# Patient Record
Sex: Male | Born: 1940 | Race: White | Hispanic: No | Marital: Married | Smoking: Never smoker
Health system: Southern US, Community
[De-identification: ages and names within clinical notes are randomized; demographics above are authoritative.]

## PROBLEM LIST (undated history)

## (undated) DIAGNOSIS — Z8719 Personal history of other diseases of the digestive system: Secondary | ICD-10-CM

## (undated) DIAGNOSIS — IMO0001 Reserved for inherently not codable concepts without codable children: Secondary | ICD-10-CM

## (undated) DIAGNOSIS — I2119 ST elevation (STEMI) myocardial infarction involving other coronary artery of inferior wall: Secondary | ICD-10-CM

## (undated) DIAGNOSIS — I2510 Atherosclerotic heart disease of native coronary artery without angina pectoris: Secondary | ICD-10-CM

## (undated) DIAGNOSIS — F329 Major depressive disorder, single episode, unspecified: Secondary | ICD-10-CM

## (undated) DIAGNOSIS — L130 Dermatitis herpetiformis: Secondary | ICD-10-CM

## (undated) DIAGNOSIS — B002 Herpesviral gingivostomatitis and pharyngotonsillitis: Secondary | ICD-10-CM

## (undated) DIAGNOSIS — G20 Parkinson's disease: Secondary | ICD-10-CM

## (undated) DIAGNOSIS — G4730 Sleep apnea, unspecified: Secondary | ICD-10-CM

## (undated) DIAGNOSIS — Z8619 Personal history of other infectious and parasitic diseases: Secondary | ICD-10-CM

## (undated) DIAGNOSIS — R001 Bradycardia, unspecified: Secondary | ICD-10-CM

## (undated) DIAGNOSIS — M7022 Olecranon bursitis, left elbow: Secondary | ICD-10-CM

## (undated) DIAGNOSIS — S2239XA Fracture of one rib, unspecified side, initial encounter for closed fracture: Secondary | ICD-10-CM

## (undated) DIAGNOSIS — Z0000 Encounter for general adult medical examination without abnormal findings: Principal | ICD-10-CM

## (undated) DIAGNOSIS — H43819 Vitreous degeneration, unspecified eye: Secondary | ICD-10-CM

## (undated) DIAGNOSIS — Z950 Presence of cardiac pacemaker: Secondary | ICD-10-CM

## (undated) DIAGNOSIS — I442 Atrioventricular block, complete: Secondary | ICD-10-CM

## (undated) DIAGNOSIS — I10 Essential (primary) hypertension: Secondary | ICD-10-CM

## (undated) DIAGNOSIS — N400 Enlarged prostate without lower urinary tract symptoms: Secondary | ICD-10-CM

## (undated) DIAGNOSIS — S02401A Maxillary fracture, unspecified, initial encounter for closed fracture: Secondary | ICD-10-CM

## (undated) DIAGNOSIS — F32A Depression, unspecified: Secondary | ICD-10-CM

## (undated) DIAGNOSIS — T85738A Infection and inflammatory reaction due to other nervous system device, implant or graft, initial encounter: Secondary | ICD-10-CM

## (undated) DIAGNOSIS — R1310 Dysphagia, unspecified: Secondary | ICD-10-CM

## (undated) DIAGNOSIS — L309 Dermatitis, unspecified: Secondary | ICD-10-CM

## (undated) DIAGNOSIS — G20A1 Parkinson's disease without dyskinesia, without mention of fluctuations: Secondary | ICD-10-CM

## (undated) HISTORY — DX: Personal history of other infectious and parasitic diseases: Z86.19

## (undated) HISTORY — DX: Bradycardia, unspecified: R00.1

## (undated) HISTORY — DX: Maxillary fracture, unspecified side, initial encounter for closed fracture: S02.401A

## (undated) HISTORY — DX: Sleep apnea, unspecified: G47.30

## (undated) HISTORY — PX: INSERT / REPLACE / REMOVE PACEMAKER: SUR710

## (undated) HISTORY — DX: Atherosclerotic heart disease of native coronary artery without angina pectoris: I25.10

## (undated) HISTORY — PX: INSERTION / PLACEMENT / REVISION NEUROSTIMULATOR: SUR720

## (undated) HISTORY — DX: Dermatitis, unspecified: L30.9

## (undated) HISTORY — DX: Benign prostatic hyperplasia without lower urinary tract symptoms: N40.0

## (undated) HISTORY — PX: INGUINAL HERNIA REPAIR: SUR1180

## (undated) HISTORY — DX: Major depressive disorder, single episode, unspecified: F32.9

## (undated) HISTORY — DX: Parkinson's disease: G20

## (undated) HISTORY — DX: ST elevation (STEMI) myocardial infarction involving other coronary artery of inferior wall: I21.19

## (undated) HISTORY — DX: Encounter for general adult medical examination without abnormal findings: Z00.00

## (undated) HISTORY — DX: Fracture of one rib, unspecified side, initial encounter for closed fracture: S22.39XA

## (undated) HISTORY — DX: Depression, unspecified: F32.A

## (undated) HISTORY — PX: EYE SURGERY: SHX253

## (undated) HISTORY — DX: Essential (primary) hypertension: I10

## (undated) HISTORY — DX: Parkinson's disease without dyskinesia, without mention of fluctuations: G20.A1

## (undated) HISTORY — PX: CARDIAC CATHETERIZATION: SHX172

## (undated) HISTORY — PX: OTHER SURGICAL HISTORY: SHX169

## (undated) HISTORY — DX: Dermatitis herpetiformis: L13.0

## (undated) HISTORY — PX: DEEP BRAIN STIMULATOR PLACEMENT: SHX608

## (undated) HISTORY — DX: Atrioventricular block, complete: I44.2

## (undated) HISTORY — DX: Reserved for inherently not codable concepts without codable children: IMO0001

---

## 1984-11-10 DIAGNOSIS — I2510 Atherosclerotic heart disease of native coronary artery without angina pectoris: Secondary | ICD-10-CM

## 1984-11-10 HISTORY — DX: Atherosclerotic heart disease of native coronary artery without angina pectoris: I25.10

## 1998-05-23 ENCOUNTER — Other Ambulatory Visit: Admission: RE | Admit: 1998-05-23 | Discharge: 1998-05-23 | Payer: Self-pay | Admitting: Cardiology

## 2000-05-06 ENCOUNTER — Ambulatory Visit (HOSPITAL_BASED_OUTPATIENT_CLINIC_OR_DEPARTMENT_OTHER): Admission: RE | Admit: 2000-05-06 | Discharge: 2000-05-06 | Payer: Self-pay | Admitting: Otolaryngology

## 2001-04-05 ENCOUNTER — Encounter (INDEPENDENT_AMBULATORY_CARE_PROVIDER_SITE_OTHER): Payer: Self-pay

## 2001-04-05 ENCOUNTER — Other Ambulatory Visit: Admission: RE | Admit: 2001-04-05 | Discharge: 2001-04-05 | Payer: Self-pay | Admitting: Surgery

## 2004-11-10 DIAGNOSIS — IMO0001 Reserved for inherently not codable concepts without codable children: Secondary | ICD-10-CM

## 2004-11-10 HISTORY — DX: Reserved for inherently not codable concepts without codable children: IMO0001

## 2005-03-06 LAB — HM COLONOSCOPY: HM Colonoscopy: NORMAL

## 2005-11-10 DIAGNOSIS — IMO0001 Reserved for inherently not codable concepts without codable children: Secondary | ICD-10-CM

## 2005-11-10 HISTORY — DX: Reserved for inherently not codable concepts without codable children: IMO0001

## 2007-04-26 ENCOUNTER — Ambulatory Visit: Payer: Self-pay | Admitting: Internal Medicine

## 2007-11-10 ENCOUNTER — Telehealth (INDEPENDENT_AMBULATORY_CARE_PROVIDER_SITE_OTHER): Payer: Self-pay | Admitting: *Deleted

## 2007-11-11 DIAGNOSIS — S02401A Maxillary fracture, unspecified, initial encounter for closed fracture: Secondary | ICD-10-CM

## 2007-11-11 HISTORY — DX: Maxillary fracture, unspecified side, initial encounter for closed fracture: S02.401A

## 2007-11-18 DIAGNOSIS — L130 Dermatitis herpetiformis: Secondary | ICD-10-CM

## 2007-11-18 DIAGNOSIS — I219 Acute myocardial infarction, unspecified: Secondary | ICD-10-CM | POA: Insufficient documentation

## 2007-11-18 DIAGNOSIS — G4730 Sleep apnea, unspecified: Secondary | ICD-10-CM

## 2007-11-18 DIAGNOSIS — G20 Parkinson's disease: Secondary | ICD-10-CM | POA: Insufficient documentation

## 2008-04-04 ENCOUNTER — Emergency Department (HOSPITAL_COMMUNITY): Admission: EM | Admit: 2008-04-04 | Discharge: 2008-04-04 | Payer: Self-pay | Admitting: Emergency Medicine

## 2008-04-24 ENCOUNTER — Ambulatory Visit: Payer: Self-pay | Admitting: Internal Medicine

## 2008-07-04 ENCOUNTER — Telehealth (INDEPENDENT_AMBULATORY_CARE_PROVIDER_SITE_OTHER): Payer: Self-pay | Admitting: *Deleted

## 2008-10-24 ENCOUNTER — Ambulatory Visit: Payer: Self-pay | Admitting: Internal Medicine

## 2009-09-11 ENCOUNTER — Telehealth: Payer: Self-pay | Admitting: Internal Medicine

## 2009-11-10 DIAGNOSIS — S2249XA Multiple fractures of ribs, unspecified side, initial encounter for closed fracture: Secondary | ICD-10-CM

## 2009-11-10 HISTORY — DX: Multiple fractures of ribs, unspecified side, initial encounter for closed fracture: S22.49XA

## 2010-05-09 ENCOUNTER — Encounter: Payer: Self-pay | Admitting: Internal Medicine

## 2010-06-21 ENCOUNTER — Ambulatory Visit: Payer: Self-pay | Admitting: Cardiology

## 2010-07-11 DIAGNOSIS — S2239XA Fracture of one rib, unspecified side, initial encounter for closed fracture: Secondary | ICD-10-CM

## 2010-07-11 HISTORY — DX: Fracture of one rib, unspecified side, initial encounter for closed fracture: S22.39XA

## 2010-07-19 ENCOUNTER — Ambulatory Visit: Payer: Self-pay | Admitting: Cardiology

## 2010-07-22 ENCOUNTER — Encounter: Admission: RE | Admit: 2010-07-22 | Discharge: 2010-10-01 | Payer: Self-pay | Admitting: Neurology

## 2010-07-29 ENCOUNTER — Ambulatory Visit: Payer: Self-pay | Admitting: Cardiovascular Disease

## 2010-08-07 ENCOUNTER — Encounter: Admission: RE | Admit: 2010-08-07 | Discharge: 2010-08-07 | Payer: Self-pay | Admitting: Cardiology

## 2010-08-07 ENCOUNTER — Ambulatory Visit: Payer: Self-pay | Admitting: Cardiology

## 2010-10-27 ENCOUNTER — Inpatient Hospital Stay (HOSPITAL_COMMUNITY)
Admission: EM | Admit: 2010-10-27 | Discharge: 2010-10-29 | Payer: Self-pay | Source: Home / Self Care | Attending: Cardiology | Admitting: Cardiology

## 2010-10-28 HISTORY — PX: PACEMAKER INSERTION: SHX728

## 2010-11-06 ENCOUNTER — Encounter: Payer: Self-pay | Admitting: Internal Medicine

## 2010-11-06 ENCOUNTER — Ambulatory Visit: Payer: Self-pay

## 2010-11-11 ENCOUNTER — Observation Stay (HOSPITAL_COMMUNITY)
Admission: EM | Admit: 2010-11-11 | Discharge: 2010-11-12 | Payer: Self-pay | Source: Home / Self Care | Attending: Internal Medicine | Admitting: Internal Medicine

## 2010-11-18 ENCOUNTER — Ambulatory Visit: Payer: Self-pay | Admitting: Cardiology

## 2010-11-27 ENCOUNTER — Encounter
Admission: RE | Admit: 2010-11-27 | Discharge: 2010-12-10 | Payer: Self-pay | Source: Home / Self Care | Attending: Cardiology | Admitting: Cardiology

## 2010-12-10 NOTE — Letter (Signed)
Summary: CMN for CPAP Supplies/[facility]  CMN for CPAP Supplies/[facility]   Imported By: Sherian Rein 05/20/2010 09:22:34  _____________________________________________________________________  External Attachment:    Type:   Image     Comment:   External Document

## 2010-12-12 NOTE — Procedures (Signed)
Summary: [facility]. pt x .gd   Current Medications (verified): 1)  Mirapex 0.25 Mg  Tabs (Pramipexole Dihydrochloride) .... Take 1 Tablet By Mouth Four Times A Day 2)  Carbidopa-Levodopa 25-100 Mg  Tabs (Carbidopa-Levodopa) .... Take 2 Tablets At 7am, 1.5 At 10am, 1pm, and 4pm Daily 3)  Lexapro 10 Mg  Tabs (Escitalopram Oxalate) 4)  Norvasc 5 Mg  Tabs (Amlodipine Besylate) .... Take 1/2 Tab By Mouth Once Daily 5)  Zetia 10 Mg  Tabs (Ezetimibe) .... Take 5 Tabs (Total of 50mg ) By Mouth Once Daily 6)  Warfarin 7)  Flomax 0.4 Mg  Cp24 (Tamsulosin Hcl) 8)  Dapsone 25 Mg  Tabs (Dapsone) .... Take 1 Tablet By Mouth Two Times A Day 9)  Bipap 10)  Nitro 11)  Carbidopa-Levodopa Cr 25-100 Mg  Tbcr (Carbidopa-Levodopa) .... Take 1 Tablet At 7-9pm Daily 12)  Aricept 10 Mg  Tabs (Donepezil Hcl) .Marland Kitchen.. 1 By Mouth Once Daily 13)  Amantadine Hcl 100 Mg  Tabs (Amantadine Hcl) .... Take 2 Tablets At 7 Am and 4 Pm Daily 14)  Comtan 200 Mg Tabs (Entacapone) .... One By Mouth Daily 15)  Wellbutrin 75 Mg Tabs (Bupropion Hcl) .... One By Mouth Daily 16)  Aspir-Low 81 Mg Tbec (Aspirin) .... One By Mouth Daily 17)  Sinemet 25-100 Mg Tabs (Carbidopa-Levodopa) .... One By Mouth At Bedtime 18)  Ciprofloxacin Hcl 500 Mg Tabs (Ciprofloxacin Hcl) .... One By Mouth Two Times A Day  Allergies (verified): 1)  ! * Gluten 2)  ! Bactrim  PPM Specifications Following MD:  Lewayne Bunting, MD     PPM Vendor:  Medtronic     PPM Model Number:  [id number]     PPM Serial Number:  [id number] PPM DOI:  10/28/2010     PPM Implanting MD:  Lewayne Bunting, MD  Lead 1    Location: RA     DOI: 10/28/2010     Model #: [id number]     Serial #: [id number]     Status: active Lead 2    Location: RV     DOI: 10/28/2010     Model #: [id number]     Serial #: [id number]     Status: active  Magnet Response Rate:  BOL 85 ERI 65  Indications:  CHB   PPM Follow Up Battery Voltage:  2.76 V     Battery Est. Longevity:  4 yrs       PPM Device Measurements Atrium   Amplitude: 2.00 mV, Impedance: 453 ohms, Threshold: 1.750 V at 1.00 msec Right Ventricle  Amplitude: 15.68 mV, Impedance: 635 ohms, Threshold: 0.50 V at 0.40 msec  Episodes MS Episodes:  4     Percent Mode Switch:  <0.1%     Ventricular High Rate:  0     Atrial Pacing:  94.6%     Ventricular Pacing:  99.9%  Parameters Mode:  DDDR     Lower Rate Limit:  60     Upper Rate Limit:  130 Paced AV Delay:  180     Sensed AV Delay:  150 Next Cardiology Appt Due:  01/21/2011 Tech Comments:  WOUND CHECK---STERI STRIPS REMOVED.  NO REDNESS OR SWELLING AT SITE.  NORMAL DEVICE FUNCTION.  CHANGED RA OUTPUT FROM 5.00 TO 3.50 DUE TO RA THRESHOLD BEING 1.750 @ 1.00.  ROV 01-21-11 W/GT. Vella Kohler  November 06, 2010 2:55 PM

## 2010-12-12 NOTE — Cardiovascular Report (Signed)
Summary: Office Visit   Office Visit   Imported By: Roderic Ovens 11/13/2010 12:23:21  _____________________________________________________________________  External Attachment:    Type:   Image     Comment:   External Document

## 2010-12-16 ENCOUNTER — Ambulatory Visit: Payer: Medicare Other | Attending: Cardiology | Admitting: Physical Therapy

## 2010-12-16 DIAGNOSIS — G20 Parkinson's disease: Secondary | ICD-10-CM | POA: Insufficient documentation

## 2010-12-16 DIAGNOSIS — Z5189 Encounter for other specified aftercare: Secondary | ICD-10-CM | POA: Insufficient documentation

## 2010-12-16 DIAGNOSIS — R293 Abnormal posture: Secondary | ICD-10-CM | POA: Insufficient documentation

## 2010-12-16 DIAGNOSIS — R279 Unspecified lack of coordination: Secondary | ICD-10-CM | POA: Insufficient documentation

## 2010-12-16 DIAGNOSIS — G20A1 Parkinson's disease without dyskinesia, without mention of fluctuations: Secondary | ICD-10-CM | POA: Insufficient documentation

## 2010-12-16 DIAGNOSIS — R269 Unspecified abnormalities of gait and mobility: Secondary | ICD-10-CM | POA: Insufficient documentation

## 2010-12-16 DIAGNOSIS — R471 Dysarthria and anarthria: Secondary | ICD-10-CM | POA: Insufficient documentation

## 2010-12-18 ENCOUNTER — Ambulatory Visit: Payer: Medicare Other | Admitting: Physical Therapy

## 2010-12-19 NOTE — Discharge Summary (Signed)
[name], [name] NO.:  [id number]  MEDICAL RECORD NO.:  [id number]          PATIENT TYPE:  INP  LOCATION:  3730                         FACILITY:  [name]  PHYSICIAN:  Doylene Canning. Ladona Ridgel, MD    DATE OF BIRTH:  [birth year]  DATE OF ADMISSION:  10/27/2010 DATE OF DISCHARGE:  10/29/2010                              DISCHARGE SUMMARY   PRIMARY CARDIOLOGIST:  Doylene Canning. Ladona Ridgel, MD  PRIMARY CARE [name]:  [name]  DISCHARGE DIAGNOSIS:  Syncope.  SECONDARY DIAGNOSES: 1. Symptomatic bradycardia with complete heart block, requiring     pacemaker placement. 2. Coronary artery disease, status post prior inferior myocardial     infarction with multiple percutaneous coronary interventions. 3. Severe Parkinson disease. 4. Obstructive sleep apnea. 5. Hypertension. 6. Dermatitis herpetiformis. 7. History of right maxillary fracture in 2009, status post rib     fractures in September 2007, following a fall. 8. Status post brain stimulator placement for Parkinson. 9. Status post arthroscopic surgery of left knee. 10.Status post bilateral cataract surgeries. 11.Status post bilateral inguinal hernia repairs. 12.Status post removal of ganglion cyst from his right wrist.  ALLERGIES:  No known drug allergies.  PROCEDURES:  Placement of temporary transvenous pacemaker, October 27, 2010.  PROCEDURE:  Successful placement of a Medtronic Sensia dual-chamber permanent pacemaker, serial number [id number], October 28, 2010.  HISTORY OF PRESENT ILLNESS:  A 70 year old male with the above problem list.  The patient presented to [name] on October 27, 2010 following two episodes of syncope during which, the patient was briefly unresponsive.  The second episode was witnessed and after regaining consciousness after just a few seconds, the patient was taken to the [name].  While in the [name], he had an another episode of syncope associated with  complete heart block and 12 seconds of asystole.  Heart rate was subsequently in the 40s.  The patient was admitted for further evaluation.  HOSPITAL COURSE:  Given symptomatic bradycardia and syncope, a temporary transvenous permanent pacemaker was placed on October 27, 2010.  The patient tolerated this well.  He had no further syncope and lab work was relatively unrevealing.  Electrophysiology was consulted and the patient underwent successful permanent pacemaker placement on October 28, 2010 with placement of Medtronic Clayton dual-chamber permanent pacemaker. The patient tolerated this procedure well.  This morning, the patient's chest x-ray showed a tiny right apical pneumothorax (pacer was placed on the right side secondary to prior placement of brain stimulator on the left).  With knowledge of a tiny apical pneumothorax, the patient was placed on oxygen therapy.  We obtained an another chest x-ray this afternoon, which has shown stable tiny right apical pneumothorax.  This being the case, we plan to discharge the patient today.  We have arranged for early follow up in our [name] with repeat chest x- ray on December 07, 2010 at 2 p.m.  The patient is being discharged home this afternoon in good condition.  DISCHARGE LABS:  Hemoglobin 15.0, hematocrit 44.0, WBC 5.0, platelet 302.  CK 83, MB 1.5, troponin-I 0.08.  Sodium 137, potassium 4.4,  chloride 104, CO2 27, BUN 10, creatinine 0.80, glucose 118.  Total bilirubin 0.7, alkaline phosphatase 77, AST 19, ALT less than 8, total protein 6.2, albumin 3.5, calcium 8.7, magnesium 2.1.  TSH 2.15, free T4 1.05.  Urinalysis was negative.  MRSA screen was negative.  DISPOSITION:  The patient will be discharged home today in good condition.  FOLLOWUP PLANS AND APPOINTMENTS:  As above, the patient will follow up with [name] on December 07, 2010 at 2 p.m. Follow up with [name] on January 21, 2011 at 4  p.m.  DISCHARGE MEDICATIONS: 1. Amantadine hydrochloride 100 mg b.i.d. 2. Amlodipine 2.5 mg daily. 3. Aricept 10 mg daily. 4. Aspirin 81 mg daily. 5. Bupropion 75 mg daily. 6. Carbidopa/levodopa 25/250 mg two tablets q.i.d. and then one tablet     at bedtime. 7. Comtan 200 mg q.i.d. 8. Dapsone 25 mg two tablets b.i.d. 9. Flomax 0.4 mg daily. 10.Zetia 10 mg daily.  OUTSTANDING LAB STUDIES:  Follow up chest x-ray on December 07, 2010.  DURATION OF DISCHARGE ENCOUNTER:  Sixty minutes including physician time.     [name], ANP   ______________________________ Doylene Canning. Ladona Ridgel, MD    CB/MEDQ  D:  10/29/2010  T:  10/30/2010  Job:  [id number]  cc:   [name] Fears D. Maple Hudson, MD, [name]  Electronically Signed by [name] ANP on 11/05/2010 03:18:57 PM Electronically Signed by [name] Bunting MD on 12/19/2010 05:03:26 PM

## 2010-12-19 NOTE — Op Note (Signed)
NAMEVICTOR, LANGENBACH NO.:  [id number]  MEDICAL RECORD NO.:  [id number]          PATIENT TYPE:  INP  LOCATION:  6527                         FACILITY:  [facility]  PHYSICIAN:  Doylene Canning. Ladona Ridgel, MD    DATE OF BIRTH:  20-Jan-1941  DATE OF PROCEDURE:  10/28/2010 DATE OF DISCHARGE:                              OPERATIVE REPORT   PROCEDURE PERFORMED:  Insertion of a dual-chamber pacemaker with venography of the right subclavian vein.  SURGEON:  Doylene Canning. Ladona Ridgel, MD  INTRODUCTION:  The patient is a 70 year old man with a history of syncope who was found to have complete heart block and sinus node dysfunction with pauses over 10 seconds.  He is now referred for dual- chamber pacemaker insertion.  PROCEDURE IN DETAIL:  After informed consent was obtained, the patient was taken to the diagnostic EP lab in a fasting state.  After usual preparation and draping, intravenous fentanyl and midazolam was given for sedation.  Lidocaine 30 mL was infiltrated into the right infraclavicular region.  A 5-cm incision was carried out over this region.  Electrocautery was utilized to dissect down to the fascial plane.  Initial attempts to puncture the subclavian vein were unsuccessful.  A 10 mL of contrast was injected into the right upper extremity venous system demonstrating that the vein itself was quite small as if it had been punctured previously and was left with some residual stenosis.  The subclavian vein was then punctured x2 and the Medtronic model 5076, 52-cm active fixation pacing lead, serial number [id number] was advanced into the right ventricle and the Medtronic model 5076, 45-cm active fixation pacing lead, serial number [id number] was advanced into the right atrium.  Mapping was carried out in the right ventricle at the final site.  The R-waves measured 9 mV, the pacing impedance was 800 ohms, and the threshold was 0.5 volts at 0.5 milliseconds.  The 10-volt pacing did  not stimulate the diaphragm.  With the right ventricular lead in satisfactory position, attention was turned to placement of the atrial lead which was the placed in the anterolateral portion of the right atrium where P-waves measured 3 mV. The pacing impedance once the lead was actively fixed was 500 ohms and the threshold was less than a volt to 0.5 milliseconds.  Again, 10-volt pacing did not stimulate the diaphragm and there was a large injury current on the atrial lead with active fixation.  With these satisfactory parameters, the leads were secured to the subpectoralis fascia with a figure-of-eight silk suture.  Sewing sleeve was also secured with silk suture.  Electrocautery was utilized to make a subcutaneous pocket.  Bacitracin irrigation was utilized to irrigate the pocket and the Medtronic Sensia dual-chamber pacemaker, serial number [id number] was connected to the atrial and ventricular leads and placed back in the subcutaneous pocket where it was secured with silk suture. The pocket was irrigated with bacitracin irrigation and the incision closed with 2-0 and 3-0 Vicryl.  Benzoin and Steri-Strips were painted on the skin.  A pressure dressing was applied and the patient was returned to his room in satisfactory condition.  COMPLICATIONS:  There were no immediate procedure complications.  RESULTS:  This demonstrated successful implantation of a Medtronic dual- chamber pacemaker after right upper extremity venography demonstrated a narrowed right subclavian vein.     Doylene Canning. Ladona Ridgel, MD     GWT/MEDQ  D:  10/28/2010  T:  10/29/2010  Job:  [id number]  Electronically Signed by Lewayne Bunting MD on 12/19/2010 05:03:20 PM

## 2010-12-20 ENCOUNTER — Ambulatory Visit: Payer: Medicare Other

## 2010-12-23 ENCOUNTER — Ambulatory Visit: Payer: Medicare Other | Admitting: Physical Therapy

## 2010-12-25 ENCOUNTER — Ambulatory Visit: Payer: Medicare Other | Admitting: Physical Therapy

## 2010-12-30 ENCOUNTER — Ambulatory Visit: Payer: Medicare Other | Admitting: Physical Therapy

## 2011-01-01 ENCOUNTER — Ambulatory Visit: Payer: Medicare Other | Admitting: Physical Therapy

## 2011-01-08 ENCOUNTER — Ambulatory Visit: Payer: Medicare Other | Admitting: Physical Therapy

## 2011-01-20 LAB — HEPATIC FUNCTION PANEL
Albumin: 3.5 g/dL (ref 3.5–5.2)
Alkaline Phosphatase: 88 U/L (ref 39–117)
Total Bilirubin: 0.6 mg/dL (ref 0.3–1.2)

## 2011-01-20 LAB — CBC
HCT: 40.5 % (ref 39.0–52.0)
HCT: 41.6 % (ref 39.0–52.0)
Hemoglobin: 13.1 g/dL (ref 13.0–17.0)
Hemoglobin: 13.3 g/dL (ref 13.0–17.0)
MCH: 29.8 pg (ref 26.0–34.0)
MCV: 92 fL (ref 78.0–100.0)
RBC: 4.4 MIL/uL (ref 4.22–5.81)
RDW: 13.8 % (ref 11.5–15.5)

## 2011-01-20 LAB — POCT I-STAT, CHEM 8
BUN: 16 mg/dL (ref 6–23)
Calcium, Ion: 1.13 mmol/L (ref 1.12–1.32)
Chloride: 103 mEq/L (ref 96–112)
Creatinine, Ser: 0.8 mg/dL (ref 0.4–1.5)
Creatinine, Ser: 0.9 mg/dL (ref 0.4–1.5)
Glucose, Bld: 111 mg/dL — ABNORMAL HIGH (ref 70–99)
Glucose, Bld: 117 mg/dL — ABNORMAL HIGH (ref 70–99)
Potassium: 4.2 mEq/L (ref 3.5–5.1)
Sodium: 142 mEq/L (ref 135–145)
TCO2: 29 mmol/L (ref 0–100)

## 2011-01-20 LAB — CARDIAC PANEL(CRET KIN+CKTOT+MB+TROPI)
CK, MB: 0.7 ng/mL (ref 0.3–4.0)
CK, MB: 1.2 ng/mL (ref 0.3–4.0)
CK, MB: 1.5 ng/mL (ref 0.3–4.0)
CK, MB: 1.6 ng/mL (ref 0.3–4.0)
Relative Index: INVALID (ref 0.0–2.5)
Relative Index: INVALID (ref 0.0–2.5)
Relative Index: INVALID (ref 0.0–2.5)
Total CK: 42 U/L (ref 7–232)
Total CK: 66 U/L (ref 7–232)
Total CK: 69 U/L (ref 7–232)
Total CK: 83 U/L (ref 7–232)
Troponin I: 0.05 ng/mL (ref 0.00–0.06)
Troponin I: 0.06 ng/mL (ref 0.00–0.06)
Troponin I: 0.07 ng/mL — ABNORMAL HIGH (ref 0.00–0.06)

## 2011-01-20 LAB — COMPREHENSIVE METABOLIC PANEL
ALT: <8 U/L (ref 0–53)
Albumin: 3.5 g/dL (ref 3.5–5.2)
Alkaline Phosphatase: 77 U/L (ref 39–117)
CO2: 27 mEq/L (ref 19–32)
Calcium: 8.7 mg/dL (ref 8.4–10.5)
GFR calc Af Amer: >60 mL/min (ref >60)
GFR calc non Af Amer: >60 mL/min (ref >60)
Potassium: 4.4 mEq/L (ref 3.5–5.1)

## 2011-01-20 LAB — URINALYSIS, ROUTINE W REFLEX MICROSCOPIC
Glucose, UA: NEGATIVE mg/dL
Glucose, UA: NEGATIVE mg/dL
Hgb urine dipstick: NEGATIVE
Ketones, ur: NEGATIVE mg/dL
Protein, ur: NEGATIVE mg/dL
Specific Gravity, Urine: 1.02 (ref 1.005–1.030)
pH: 7 (ref 5.0–8.0)

## 2011-01-20 LAB — POCT CARDIAC MARKERS: Troponin i, poc: <0.05 ng/mL (ref 0.00–0.09)

## 2011-01-20 LAB — T4, FREE: Free T4: 1.05 ng/dL (ref 0.80–1.80)

## 2011-01-20 LAB — DIFFERENTIAL
Basophils Absolute: 0 10*3/uL (ref 0.0–0.1)
Basophils Relative: 0 % (ref 0–1)
Eosinophils Absolute: 0.2 10*3/uL (ref 0.0–0.7)
Eosinophils Relative: 2 % (ref 0–5)
Lymphocytes Relative: 28 % (ref 12–46)
Lymphs Abs: 1.6 10*3/uL (ref 0.7–4.0)
Monocytes Relative: 11 % (ref 3–12)
Monocytes Relative: 15 % — ABNORMAL HIGH (ref 3–12)
Neutro Abs: 2.7 10*3/uL (ref 1.7–7.7)
Neutrophils Relative %: 65 % (ref 43–77)

## 2011-01-20 LAB — TSH: TSH: 3.902 u[IU]/mL (ref 0.350–4.500)

## 2011-01-21 ENCOUNTER — Encounter (INDEPENDENT_AMBULATORY_CARE_PROVIDER_SITE_OTHER): Payer: Medicare Other | Admitting: Internal Medicine

## 2011-01-21 ENCOUNTER — Encounter: Payer: Self-pay | Admitting: Internal Medicine

## 2011-01-21 DIAGNOSIS — I4891 Unspecified atrial fibrillation: Secondary | ICD-10-CM | POA: Insufficient documentation

## 2011-01-21 DIAGNOSIS — I495 Sick sinus syndrome: Secondary | ICD-10-CM

## 2011-01-21 DIAGNOSIS — I10 Essential (primary) hypertension: Secondary | ICD-10-CM

## 2011-01-21 DIAGNOSIS — Z950 Presence of cardiac pacemaker: Secondary | ICD-10-CM | POA: Insufficient documentation

## 2011-01-28 NOTE — Assessment & Plan Note (Signed)
Summary: pc2.gd /cy/per chris b gd/lkl   Visit Type:  Follow-up Primary Provider:  [name]   History of Present Illness: [name] returns today for PPM followup. He is a pleasant 70 yo man with a h/o symptomatic bradycardia, s/p PPM. He has CAD and is s/p MI. He denies c/p, sob or peripheral edema. He is followed by Dr. [name]. He also has atrial fib but is not on coumadin secondary to falls with his Parkinson's.  Current Medications (verified): 1)  Comtan 200 Mg Tabs (Entacapone) .... One By Mouth 4 Times Per Day 2)  Carbidopa-Levodopa 25-100 Mg  Tabs (Carbidopa-Levodopa) .... Take 2 Tablets At 7am, 1.5 At 10am, 1pm, and 4pm Daily 3)  Lexapro 20 Mg Tabs (Escitalopram Oxalate) .... Once Daily 4)  Zetia 10 Mg  Tabs (Ezetimibe) .... Once Daily 5)  Flomax 0.4 Mg  Cp24 (Tamsulosin Hcl) .... Once Daily 6)  Dapsone 25 Mg  Tabs (Dapsone) .... 2 Tabs Daily 7)  Bipap 8)  Nitro .... As Needed 9)  Aricept 10 Mg  Tabs (Donepezil Hcl) .Marland Kitchen.. 1 By Mouth Once Daily 10)  Amantadine Hcl 100 Mg  Tabs (Amantadine Hcl) .... Take 1 Tablets At 7 Am and 4 Pm Daily 11)  Aspir-Low 81 Mg Tbec (Aspirin) .... One By Mouth Daily 12)  Sinemet 25-100 Mg Tabs (Carbidopa-Levodopa) .... One By Mouth At Bedtime  Allergies: 1)  ! * Gluten 2)  ! Bactrim  Past History:  Past Medical History: Last updated: 10/24/2008 Sleep Apnea NPSG 12/07/99 AHI 18 /hr, 12/10/06- 51.8/hr. Parkinson's CAD/ MI 1986  Past Surgical History: Last updated: 04/24/2008 Brain implant for Parkinson's  Review of Systems       The patient complains of dyspnea on exertion.  The patient denies chest pain, syncope, and peripheral edema.    Vital Signs:  Patient profile:   70 year old male Height:      71 inches Weight:      173 pounds BMI:     24.22 Pulse rate:   76 / minute BP sitting:   100 / 60  (left arm)  Vitals Entered By: Laurance Flatten CMA (January 21, 2011 4:04 PM)  Physical Exam  General:  70 yo man in no acute distress  with a mild tremor.  HEENT: normal Neck: supple. No JVD. Carotids 2+ bilaterally no bruits Cor: RRR no rubs, gallops or murmur Lungs: CTA. Well healed PPM incision. Ab: soft, nontender. nondistended. No HSM. Good bowel sounds Ext: warm. no cyanosis, clubbing or edema Neuro: alert and oriented. Grossly nonfocal. affect pleasant    PPM Specifications Following MD:  Lewayne Bunting, MD     PPM Vendor:  Medtronic     PPM Model Number:  [id number]     PPM Serial Number:  [id number] PPM DOI:  10/28/2010     PPM Implanting MD:  Lewayne Bunting, MD  Lead 1    Location: RA     DOI: 10/28/2010     Model #: 2956     Serial #: [id number]     Status: active Lead 2    Location: RV     DOI: 10/28/2010     Model #: 6962     Serial #: [id number]     Status: active  Magnet Response Rate:  BOL 85 ERI 65  Indications:  CHB   Parameters Mode:  DDDR     Lower Rate Limit:  60     Upper Rate Limit:  130 Paced AV Delay:  180  Sensed AV Delay:  150 MD Comments:  Normal device function.  Impression & Recommendations:  Problem # 1:  CARDIAC PACEMAKER IN SITU (ICD-V45.01) His device is working normally. Will recheck in several months.  Problem # 2:  ATRIAL FIBRILLATION (ICD-427.31) He has minimal symptoms and is not a coumadin candidate. His rate is well controlled. His updated medication list for this problem includes:    Aspir-low 81 Mg Tbec (Aspirin) ..... One by mouth daily

## 2011-01-28 NOTE — Cardiovascular Report (Signed)
Summary: Office Visit   Office Visit   Imported By: Roderic Ovens 01/24/2011 15:03:00  _____________________________________________________________________  External Attachment:    Type:   Image     Comment:   External Document

## 2011-02-21 ENCOUNTER — Telehealth: Payer: Self-pay | Admitting: Cardiology

## 2011-02-21 NOTE — Telephone Encounter (Signed)
RECVD CALL FROM PT.  HE HAS QUESTIONS ABOUT COUMADIN AND SHOULD HE BE ON ASA?  PLEASE CALL.

## 2011-02-24 NOTE — Telephone Encounter (Signed)
Dr Ladona Ridgel checked pacemaker and patient in a fib.  Resume his coumadin and see dr. Patty Sermons on 4/18.  Wife states no falls since starting rehab and using walker.  Per Dr. Patty Sermons

## 2011-02-25 ENCOUNTER — Encounter: Payer: Self-pay | Admitting: *Deleted

## 2011-02-26 ENCOUNTER — Encounter: Payer: Self-pay | Admitting: Cardiology

## 2011-02-26 ENCOUNTER — Ambulatory Visit (INDEPENDENT_AMBULATORY_CARE_PROVIDER_SITE_OTHER): Payer: Medicare Other | Admitting: Cardiology

## 2011-02-26 DIAGNOSIS — L130 Dermatitis herpetiformis: Secondary | ICD-10-CM

## 2011-02-26 DIAGNOSIS — G20 Parkinson's disease: Secondary | ICD-10-CM

## 2011-02-26 DIAGNOSIS — I4891 Unspecified atrial fibrillation: Secondary | ICD-10-CM

## 2011-02-26 DIAGNOSIS — I219 Acute myocardial infarction, unspecified: Secondary | ICD-10-CM

## 2011-02-26 NOTE — Assessment & Plan Note (Signed)
The patient has a remote history of inferior wall myocardial infarction in 1986 treated with emergency angioplasty.  He had several further angioplasties but his last angioplasty was in 2002.  He does not have stents.  He has not had any since chest pain.  He has not had any recent ischemic workup.

## 2011-02-26 NOTE — Assessment & Plan Note (Signed)
The patient has a dual-chamber pacemaker in place because of severe sinus pauses resulting in syncope.  The patient has had no further syncope since the pacemaker was implanted.  Patient had recent pacemaker interrogation which showed that he had had 71 mode switch episodes.  We had started him back on Coumadin 5 days ago.  He has not had any falls or syncope but he is very unsteady on his feet from his Parkinson's disease.

## 2011-02-26 NOTE — Assessment & Plan Note (Signed)
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  He also has a history of gluten enteropathy

## 2011-02-26 NOTE — Assessment & Plan Note (Signed)
The patient has had significant Parkinson's disease.  He is followed closely at [facility] where he had a previous deep brain stimulator implanted for his Parkinson's disease.  He still has slurred speech and is difficult to understand at times because of his Parkinson's and soft voice.

## 2011-02-26 NOTE — Progress Notes (Signed)
Alonza Bogus Date of Birth:  Apr 24, 1941 [facility] / [facility] [address]   [address] [place], [place]  [address] [phone]           Fax   [phone]  History of Present Illness: This pleasant 70 year old gentleman is seen for a scheduled followup office visit.  I recently put him back on Coumadin after a phone report of recurrent atrial fibrillation.  That was 5 days ago.  He comes in now for a followup visit.  His electrocardiogram today shows an AV paced rhythm.  The patient has not been expressing any chest pain or shortness of breath.  He does have a history of coronary disease and had an inferior wall myocardial infarction in 1986 treated with emergency angioplasty.  His last cardiac catheterization and angioplasty was in 2002.  He does not have coronary stents.  He was hospitalized on 10/27/10 with recurrent syncopal episodes and was found to have long periods of asystole on his monitor and he underwent temporary pacemaker insertion on 10/27/10 and then subsequent permanent dual-chamber pacemaker insertion.  Following his hospitalization for his pacemaker he had problems with increasing confusion and saw his neurologist at [facility] and was hospitalized for 4 days and was placed on Seroquel with improvement.  Current Outpatient Prescriptions  Medication Sig Dispense Refill  . acetaminophen (TYLENOL) 325 MG tablet Take 650 mg by mouth every 6 (six) hours as needed.        . carbidopa-levodopa (SINEMET) 25-100 MG per tablet Take 1 tablet by mouth daily.       . Carbidopa-Levodopa-Entacapone (STALEVO 200 PO) Take 200 mg by mouth QID.        Marland Kitchen donepezil (ARICEPT) 10 MG tablet Take 10 mg by mouth daily.        Marland Kitchen escitalopram (LEXAPRO) 20 MG tablet Take 20 mg by mouth daily.        Marland Kitchen ezetimibe (ZETIA) 10 MG tablet Take 10 mg by mouth daily.        . nitroGLYCERIN (NITROSTAT) 0.4 MG SL tablet Place 0.4 mg under the tongue every 5 (five) minutes as needed.        .  Tamsulosin HCl (FLOMAX) 0.4 MG CAPS Take by mouth daily.        Marland Kitchen DISCONTD: warfarin (COUMADIN) 5 MG tablet Take 5 mg by mouth daily. Taking 5mg  daily x 5days and 2.5 mg x 2 days       . aspirin 81 MG tablet Take 81 mg by mouth daily.        . carbidopa-levodopa (SINEMET) 25-100 MG per tablet Take 1 tablet by mouth at bedtime.        . Cholecalciferol (VITAMIN D3) 3000 UNITS TABS Take 2,000 Units by mouth daily.        . entacapone (COMTAN) 200 MG tablet Take 200 mg by mouth 4 (four) times daily.        . tadalafil (CIALIS) 5 MG tablet Take 5 mg by mouth daily as needed.          Allergies  Allergen Reactions  . Bactrim   . Crestor (Rosuvastatin Calcium)   . Gluten   . Sulfamethoxazole W/Trimethoprim     Patient Active Problem List  Diagnoses  . PARKINSON'S DISEASE  . MYOCARDIAL INFARCTION  . DERMATITIS HERPETIFORMIS  . SLEEP APNEA  . ATRIAL FIBRILLATION  . CARDIAC PACEMAKER IN SITU    History  Smoking status  . Never Smoker   Smokeless tobacco  .  Not on file    History  Alcohol Use: Not on file    Family History  Problem Relation Age of Onset  . Heart attack Mother   . Heart disease Mother   . Hypertension Mother   . Heart attack Father     Review of Systems: Constitutional: no fever chills diaphoresis or fatigue or change in weight.  Head and neck: no hearing loss, no epistaxis, no photophobia or visual disturbance. Respiratory: No cough, shortness of breath or wheezing. Cardiovascular: No chest pain peripheral edema, palpitations. Gastrointestinal: No abdominal distention, no abdominal pain, no change in bowel habits hematochezia or melena. Genitourinary: No dysuria, no frequency, no urgency, no nocturia. Musculoskeletal:No arthralgias, no back pain, no gait disturbance or myalgias. Neurological: No dizziness, no headaches, no numbness, no seizures, no syncope, , no tremors. Hematologic: No lymphadenopathy, no easy bruising. Psychiatric: No confusion, no  hallucinations, no sleep disturbance.    Physical Exam: Filed Vitals:   02/26/11 1554  BP: 120/80  Pulse: 60  Weight 170.  The general appearance reveals a well-developed well-nourished gentleman who has obvious   Parkinson's disease and a unsteady gait.Pupils equal and reactive.   Extraocular Movements are full.  There is no scleral icterus.  The mouth and pharynx are normal.  The neck is supple.  The carotids reveal no bruits.  The jugular venous pressure is normal.  The thyroid is not enlarged.  There is no lymphadenopathy.The chest is clear to percussion and auscultation. There are no rales or rhonchi. Expansion of the chest is symmetrical.  He has his cardiac pacemaker in the right upper chest and his deep brain neurostimulator generator in the left upper chest.  The precordium is quiet.  The first heart sound is normal.  The second heart sound is physiologically split.  There is no murmur gallop rub or click.  There is no abnormal lift or heave.The abdomen is soft and nontender. Bowel sounds are normal. The liver and spleen are not enlarged. There Are no abdominal masses. There are no bruits.  Normal extremity no phlebitis or edema.  He does have a parkinsonian tremor bilaterally.   Assessment / Plan: Continue present medication except we are stopping his Coumadin at this point.  He is not a good Coumadin candidate because of his likelihood of false.  He is very unsteady on his feet.  He will continue with just an 81 mg aspirin daily.  We will plan to see him in 4 months for a followup office visit and fasting lipid panel and chemistries and CBC

## 2011-03-25 NOTE — Assessment & Plan Note (Signed)
[facility]                             PULMONARY OFFICE NOTE   NAME:[name], [name]                      MRN:          [id number]  DATE:04/26/2007                            DOB:          1941/06/17    PROBLEM:  A 70 year old man self referred to reestablish with me for  obstructive sleep apnea.   HISTORY:  Obstructive sleep apnea which was diagnosed on May 06, 2000  at the [facility] with an index of 18 per hour.  Normal oxygenation and  moderate snoring.  He saw Dr. Annalee Genta for ENT evaluation and surgical  options, but used CPAP.  Most recently, he had a followup study at  [facility] on December 10, 2006 demonstrating moderate to severe obstructive  apnea with an index of 50.8 per hour, and successful CPAP titration,  then BiPAP titration to inspiratory of 14, expiratory 10, which is what  he has been on.  He was at [facility] for a brain implant in August 2007 to  manage Parkinson's disease.  He is using a full face ResMed Quattro  mask, but drools a lot in his sleep.  He does not use a humidifier.  His  active limb movements while awake settle down when he is asleep.  He  wants to establish for sleep medicine and follow up here.   MEDICATIONS:  1. Mirapex 0.5 mg q.i.d.  2. Mirapex 0.25 mg q.i.d.  3. Carbidopa levodopa 25/100.  4. Lexapro 10 mg.  5. Norvasc 5 mg x1/2.  6. Warfarin.  7. Zetia 50 mg.  8. Flomax 0.4 mg.  9. Dapsone 25 mg b.i.d. p.r.n. for rash.   NO MEDICATION ALLERGY.   REVIEW OF SYSTEMS:  He drools in sleep.  Nasal congestion with winter  colds.  He sleeps in a recliner when he cannot tolerate CPAP.   PAST MEDICAL HISTORY:  1. Myocardial infarction with Coumadin.  2. Sleep apnea.  3. Parkinson's disease with deep brain stimulator.  4. 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  There      is a gluten sensitive component to this, and he watches diet.  5. He has had a 4-vessel cardiac bypass.   SOCIAL  HISTORY:  Alcohol twice a week.  No tobacco.  He is married.  Retired from Geophysicist/field seismologist at [employer].  Traveled to [place]  last year.  He golfs regularly, and works out with a Psychologist, educational a couple of  days a week.   FAMILY HISTORY:  Brother had palatoplasty for sleep apnea.  Others with  heart disease.   OBJECTIVE:  Weight 187 pounds.  BP 102/68.  Pulse 60.  Room air  saturation 95%.  Obvious erratic limb movements and jerks, and some tremor.  Brain  stimulator generator is implanted in his left anterior chest wall.  There is no stridor or nasal congestion.  LUNG FIELDS:  Clear.  HEART:  Sounds regular without murmur.   IMPRESSION:  1. Obstructive sleep apnea with an index of 50.8 on December 10, 2006      at [facility], now well controlled as  near he can tell on      BiPAP inspiratory 40, expiratory 10 through [facility] with      an full face mask.  2. Parkinson's disease with deep brain stimulator.   PLAN:  Continue BiPAP with no changes to offer.  He will return in a  year for followup.  Home Care will contact me when needed.     Clinton D. Maple Hudson, MD, Tonny Bollman, FACP  Electronically Signed    CDY/MedQ  DD: 04/27/2007  DT: 04/28/2007  Job #: [id number]   cc:   [name], M.D.  [name] Lacks, MD
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
## 2011-04-23 ENCOUNTER — Encounter: Payer: Self-pay | Admitting: Cardiology

## 2011-06-20 ENCOUNTER — Telehealth: Payer: Self-pay | Admitting: Cardiology

## 2011-06-20 DIAGNOSIS — R05 Cough: Secondary | ICD-10-CM

## 2011-06-20 MED ORDER — AZITHROMYCIN 250 MG PO TABS: ORAL_TABLET | ORAL | Status: AC

## 2011-06-20 NOTE — Telephone Encounter (Signed)
Patient has cough/congestion.  [facility] called to [facility]

## 2011-06-20 NOTE — Telephone Encounter (Signed)
Ok to fill 

## 2011-06-20 NOTE — Telephone Encounter (Signed)
Pt said he has a cold and wants a Zpac

## 2011-06-20 NOTE — Telephone Encounter (Signed)
Okay to fill Z-Pak

## 2011-06-25 ENCOUNTER — Other Ambulatory Visit: Payer: Self-pay | Admitting: Cardiology

## 2011-06-25 DIAGNOSIS — G20 Parkinson's disease: Secondary | ICD-10-CM

## 2011-06-25 DIAGNOSIS — I4891 Unspecified atrial fibrillation: Secondary | ICD-10-CM

## 2011-06-25 DIAGNOSIS — I219 Acute myocardial infarction, unspecified: Secondary | ICD-10-CM

## 2011-06-27 ENCOUNTER — Encounter: Payer: Self-pay | Admitting: Cardiology

## 2011-06-27 ENCOUNTER — Ambulatory Visit (INDEPENDENT_AMBULATORY_CARE_PROVIDER_SITE_OTHER): Payer: Medicare Other | Admitting: Cardiology

## 2011-06-27 ENCOUNTER — Other Ambulatory Visit (INDEPENDENT_AMBULATORY_CARE_PROVIDER_SITE_OTHER): Payer: Medicare Other | Admitting: *Deleted

## 2011-06-27 DIAGNOSIS — E7800 Pure hypercholesterolemia, unspecified: Secondary | ICD-10-CM

## 2011-06-27 DIAGNOSIS — G20 Parkinson's disease: Secondary | ICD-10-CM

## 2011-06-27 DIAGNOSIS — I219 Acute myocardial infarction, unspecified: Secondary | ICD-10-CM

## 2011-06-27 DIAGNOSIS — I259 Chronic ischemic heart disease, unspecified: Secondary | ICD-10-CM

## 2011-06-27 DIAGNOSIS — I4891 Unspecified atrial fibrillation: Secondary | ICD-10-CM

## 2011-06-27 DIAGNOSIS — G20A1 Parkinson's disease without dyskinesia, without mention of fluctuations: Secondary | ICD-10-CM

## 2011-06-27 DIAGNOSIS — Z950 Presence of cardiac pacemaker: Secondary | ICD-10-CM

## 2011-06-27 DIAGNOSIS — L130 Dermatitis herpetiformis: Secondary | ICD-10-CM

## 2011-06-27 LAB — CBC WITH DIFFERENTIAL/PLATELET
Basophils Absolute: 0 10*3/uL (ref 0.0–0.1)
Eosinophils Absolute: 0.3 10*3/uL (ref 0.0–0.7)
Hemoglobin: 13.9 g/dL (ref 13.0–17.0)
Lymphocytes Relative: 27.9 % (ref 12.0–46.0)
MCHC: 31.9 g/dL (ref 30.0–36.0)
MCV: 90.6 fl (ref 78.0–100.0)
Monocytes Absolute: 0.5 10*3/uL (ref 0.1–1.0)
Neutro Abs: 2.5 10*3/uL (ref 1.4–7.7)
RDW: 14 % (ref 11.5–14.6)

## 2011-06-27 LAB — LIPID PANEL
HDL: 40 mg/dL (ref >39.00)
Triglycerides: 62 mg/dL (ref 0.0–149.0)

## 2011-06-27 LAB — BASIC METABOLIC PANEL
CO2: 33 mEq/L — ABNORMAL HIGH (ref 19–32)
Calcium: 9.2 mg/dL (ref 8.4–10.5)
Chloride: 103 mEq/L (ref 96–112)
Creatinine, Ser: 0.8 mg/dL (ref 0.4–1.5)
Glucose, Bld: 99 mg/dL (ref 70–99)
Sodium: 141 mEq/L (ref 135–145)

## 2011-06-27 NOTE — Progress Notes (Signed)
[name] Date of Birth:  [birth year] [facility] / [facility] [address]   [address] [place], [place]  [address] [phone]           Fax   [phone]  History of Present Illness: This pleasant 70 year old gentleman is seen for a scheduled followup office visit.He has a remote history of inferior wall myocardial infarction in 1986 treated with emergency angioplasty.  He had a subsequent angioplasty in 2002.  He does not have any stents.  He was hospitalized on 10/27/10 with recurrent syncopal episodes and was found to be having long periods of asystole and he underwent dual chamber pacemaker insertion.  He has had no further syncope.  He has continued to have problems with falls and poor balance.  He has severe parkinsonism.  He is followed at [facility] for this.  He has had a neuro brain stimulator previously placed for his Parkinson's disease.  The patient continues to take physical therapy for his parkinsonism.  The patient has not been expressing any chest pain or angina.  Patient has a chronic dermatologic condition called dermatitis herpetiformis and is followed by Dr. Danella Deis.  He is on dapsone for that and requires periodic CBCs  Current Outpatient Prescriptions  Medication Sig Dispense Refill  . acetaminophen (TYLENOL) 325 MG tablet Take 650 mg by mouth every 6 (six) hours as needed.        Marland Kitchen amantadine (SYMMETREL) 100 MG capsule Take 100 mg by mouth 2 (two) times daily.        Marland Kitchen aspirin 81 MG tablet Take 81 mg by mouth daily.        . carbidopa-levodopa (SINEMET) 25-100 MG per tablet Take 1 tablet by mouth at bedtime.        . Carbidopa-Levodopa-Entacapone (STALEVO 200 PO) Take 200 mg by mouth QID.        Marland Kitchen dapsone 25 MG tablet Take 25 mg by mouth 2 (two) times daily.        Marland Kitchen donepezil (ARICEPT) 10 MG tablet Take 10 mg by mouth daily.        Marland Kitchen escitalopram (LEXAPRO) 20 MG tablet Take 20 mg by mouth daily.        Marland Kitchen ezetimibe (ZETIA) 10 MG tablet Take 10 mg by  mouth daily.        . nitroGLYCERIN (NITROSTAT) 0.4 MG SL tablet Place 0.4 mg under the tongue every 5 (five) minutes as needed.        . tadalafil (CIALIS) 5 MG tablet Take 5 mg by mouth daily as needed.        . Tamsulosin HCl (FLOMAX) 0.4 MG CAPS Take by mouth daily.        . Cholecalciferol (VITAMIN D3) 3000 UNITS TABS Take 2,000 Units by mouth daily.          Allergies  Allergen Reactions  . Bactrim   . Crestor (Rosuvastatin Calcium)   . Gluten   . Sulfamethoxazole W/Trimethoprim     Patient Active Problem List  Diagnoses  . PARKINSON'S DISEASE  . MYOCARDIAL INFARCTION  . DERMATITIS HERPETIFORMIS  . SLEEP APNEA  . ATRIAL FIBRILLATION  . CARDIAC PACEMAKER IN SITU    History  Smoking status  . Never Smoker   Smokeless tobacco  . Not on file    History  Alcohol Use: Not on file    Family History  Problem Relation Age of Onset  . Heart attack Mother   . Heart disease Mother   .  Hypertension Mother   . Heart attack Father     Review of Systems: Constitutional: no fever chills diaphoresis or fatigue or change in weight.  Head and neck: no hearing loss, no epistaxis, no photophobia or visual disturbance. Respiratory: No cough, shortness of breath or wheezing. Cardiovascular: No chest pain peripheral edema, palpitations. Gastrointestinal: No abdominal distention, no abdominal pain, no change in bowel habits hematochezia or melena. Genitourinary: No dysuria, no frequency, no urgency, no nocturia. Musculoskeletal:No arthralgias, no back pain, no gait disturbance or myalgias. Neurological: No dizziness, no headaches, no numbness, no seizures, no syncope, no weakness, no tremors. Hematologic: No lymphadenopathy, no easy bruising. Psychiatric: No confusion, no hallucinations, no sleep disturbance.    Physical Exam: Filed Vitals:   06/27/11 0934  BP: 110/60  Pulse: 70  The general appearance reveals a pleasant elderly gentleman in no acute distress.  He is quite  ataxic.  Speech is slurred and difficult to understand.Pupils equal and reactive.   Extraocular Movements are full.  There is no scleral icterus.  The mouth and pharynx are normal.  The neck is supple.  The carotids reveal no bruits.  The jugular venous pressure is normal.  The thyroid is not enlarged.  There is no lymphadenopathy.  The chest is clear to percussion and auscultation. There are no rales or rhonchi. Expansion of the chest is symmetrical.  The precordium is quiet.  The first heart sound is normal.  The second heart sound is physiologically split.  There is no murmur gallop rub or click.  There is no abnormal lift or heave.  The abdomen is soft and nontender. Bowel sounds are normal. The liver and spleen are not enlarged. There Are no abdominal masses. There are no bruits.  The pedal pulses are good.  There is no phlebitis or edema.  There is no cyanosis or clubbing.  Integument shows dry skin     Assessment / Plan:  The patient is to continue same meds.  Blood work drawn today is pending.  Recheck in 4 months.  He will also need to find a primary care provider and we gave him some potential names.

## 2011-06-27 NOTE — Assessment & Plan Note (Signed)
Despite his deep brain stimulator his Parkinson's disease remained severe.  He is very ataxic and unsteady on his feet.  The Parkinson's also affects his speech which is slurred and difficult to understand.

## 2011-06-27 NOTE — Assessment & Plan Note (Signed)
The patient has not been expressing any recurrent chest pain or angina.

## 2011-06-27 NOTE — Assessment & Plan Note (Signed)
The patient remains in a paced rhythm.  He has not had any awareness of palpitations or tachycardia.  No TIA symptoms.

## 2011-06-30 ENCOUNTER — Telehealth: Payer: Self-pay | Admitting: *Deleted

## 2011-06-30 NOTE — Telephone Encounter (Signed)
Advised of labs and copy sent to Dr. Danella Deis

## 2011-06-30 NOTE — Telephone Encounter (Signed)
Message copied by Eugenia Pancoast on Mon Jun 30, 2011 10:41 AM ------      Message from: Cassell Clement      Created: Sat Jun 28, 2011  7:55 PM       LP very good.BMET good. CBC good.  CSD

## 2011-07-01 ENCOUNTER — Telehealth: Payer: Self-pay | Admitting: Cardiology

## 2011-07-01 NOTE — Telephone Encounter (Signed)
No HFP done.  Advised Dr Nino Parsley office.

## 2011-07-01 NOTE — Telephone Encounter (Signed)
Dr. Marylee Floras wants to know if patient has had any liver tests done lately.  Please call Dois Davenport back.  Chart in box

## 2011-08-06 LAB — PROTIME-INR: Prothrombin Time: 36.8 — ABNORMAL HIGH

## 2011-08-22 ENCOUNTER — Ambulatory Visit (INDEPENDENT_AMBULATORY_CARE_PROVIDER_SITE_OTHER): Payer: Medicare Other | Admitting: Family Medicine

## 2011-08-22 ENCOUNTER — Encounter: Payer: Self-pay | Admitting: Family Medicine

## 2011-08-22 DIAGNOSIS — S20219A Contusion of unspecified front wall of thorax, initial encounter: Secondary | ICD-10-CM

## 2011-08-22 DIAGNOSIS — L130 Dermatitis herpetiformis: Secondary | ICD-10-CM

## 2011-08-22 DIAGNOSIS — I2510 Atherosclerotic heart disease of native coronary artery without angina pectoris: Secondary | ICD-10-CM | POA: Insufficient documentation

## 2011-08-22 DIAGNOSIS — Z23 Encounter for immunization: Secondary | ICD-10-CM

## 2011-08-22 DIAGNOSIS — F0390 Unspecified dementia without behavioral disturbance: Secondary | ICD-10-CM | POA: Insufficient documentation

## 2011-08-22 DIAGNOSIS — E785 Hyperlipidemia, unspecified: Secondary | ICD-10-CM

## 2011-08-22 DIAGNOSIS — G20 Parkinson's disease: Secondary | ICD-10-CM

## 2011-08-22 NOTE — Progress Notes (Signed)
  Subjective:    Patient ID: [name], male    DOB: [birth year], 70 y.o.   MRN: [id number]  HPI New to establish.  Previous MD- [place].  Cards- [place], Neuro- Siddiqui, Uro- MacDiarmid, Derm- Danella Deis (dermatitis herpetiformis)  Pulm- Young (OSA- on CPAP nightly)  Hematoma of chest wall- over deep brain stimulator, nontender, not warm, no drainage.  Had battery replaced 2 weeks ago.  Extensive bruising.  Denies pain.  Has hx of previous infection at the site  Parkinson's- following w/ Neuro twice yearly.  On Sinemet, stalevo, deep brain stimulator.  Ambulates w/ walker.  Has associated dementia.  Dermatitis herpetiformis- flares w/ Gluten.  Takes Dapsone to allow a more liberal diet.  Follows w/ Dr Danella Deis.  Currently asymptomatic.  Hyperlipidemia- wife reports he follows w/ Dr Patty Sermons regularly.  Has been intolerant to statins previously.  UTD on colonoscopy- 2 yrs ago w/ Deboraha Sprang  **of note- wife reports pt has been frequenting prostitutes.  He is unable to have sex w/out assistance of viagra.  Would prefer he not be given any.  Pt has been checked for STDs- was negative**   Review of Systems For ROS see HPI     Objective:   Physical Exam  Vitals reviewed. Constitutional: No distress.       Frail and uncomfortable looking- pronounced scoliotic and kyphotic curves.  Very difficult to understand his speech  HENT:  Head: Normocephalic and atraumatic.  Neck: Neck supple.  Cardiovascular: Normal rate, regular rhythm and normal heart sounds.   Pulmonary/Chest: Effort normal and breath sounds normal. No respiratory distress. He has no wheezes. He has no rales. He exhibits no tenderness.  Abdominal: Soft. Bowel sounds are normal. He exhibits no distension. There is no tenderness. There is no rebound.  Musculoskeletal: He exhibits no edema.  Lymphadenopathy:    He has no cervical adenopathy.  Neurological: He is alert.  Skin: Skin is dry.       Extensive bruising and hematoma  over L chest wall at site of deep brain stimulator.  Nontender.  Not warm to touch.  Not red.          Assessment & Plan:

## 2011-08-22 NOTE — Patient Instructions (Signed)
Schedule your complete physical in 6 months Please alternate heat and ice on the hematoma to absorb some of the swelling- please watch for increased redness or warmth Call with any questions or concerns Welcome!  We're glad to have you!

## 2011-08-26 DIAGNOSIS — S20219A Contusion of unspecified front wall of thorax, initial encounter: Secondary | ICD-10-CM | POA: Insufficient documentation

## 2011-08-26 NOTE — Assessment & Plan Note (Signed)
Chronic problem.  Follows w/ Dr Danella Deis.  Well controlled if he follows gluten free diet, dapsone allows more liberal diet.

## 2011-08-26 NOTE — Assessment & Plan Note (Signed)
Follows w/ Dr Patty Sermons regularly.  Not on statin due to previous intolerance.  Will follow along and assist as able.

## 2011-08-26 NOTE — Assessment & Plan Note (Signed)
Pt's dz appears to be advanced.  Following w/ neuro at [facility].  On meds and has deep brain stimulator.

## 2011-08-26 NOTE — Assessment & Plan Note (Signed)
At site of recent battery change in brain stimulator.  No evidence of cellulitis or infxn.  Reviewed supportive care and red flags that should prompt return.  Pt expressed understanding and is in agreement w/ plan.
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
## 2011-08-29 DIAGNOSIS — G20 Parkinson's disease: Secondary | ICD-10-CM | POA: Insufficient documentation

## 2011-10-29 ENCOUNTER — Ambulatory Visit (INDEPENDENT_AMBULATORY_CARE_PROVIDER_SITE_OTHER): Payer: Medicare Other | Admitting: Cardiology

## 2011-10-29 ENCOUNTER — Encounter: Payer: Self-pay | Admitting: Cardiology

## 2011-10-29 ENCOUNTER — Other Ambulatory Visit: Payer: Medicare Other | Admitting: *Deleted

## 2011-10-29 VITALS — BP 100/78 | HR 66 | Ht 71.0 in | Wt 171.0 lb

## 2011-10-29 DIAGNOSIS — I219 Acute myocardial infarction, unspecified: Secondary | ICD-10-CM

## 2011-10-29 DIAGNOSIS — E785 Hyperlipidemia, unspecified: Secondary | ICD-10-CM

## 2011-10-29 DIAGNOSIS — N400 Enlarged prostate without lower urinary tract symptoms: Secondary | ICD-10-CM

## 2011-10-29 DIAGNOSIS — L130 Dermatitis herpetiformis: Secondary | ICD-10-CM

## 2011-10-29 DIAGNOSIS — I119 Hypertensive heart disease without heart failure: Secondary | ICD-10-CM

## 2011-10-29 DIAGNOSIS — G20A1 Parkinson's disease without dyskinesia, without mention of fluctuations: Secondary | ICD-10-CM

## 2011-10-29 DIAGNOSIS — E7800 Pure hypercholesterolemia, unspecified: Secondary | ICD-10-CM

## 2011-10-29 DIAGNOSIS — K900 Celiac disease: Secondary | ICD-10-CM

## 2011-10-29 DIAGNOSIS — G20 Parkinson's disease: Secondary | ICD-10-CM

## 2011-10-29 LAB — HEPATIC FUNCTION PANEL
AST: 31 U/L (ref 0–37)
Albumin: 4.2 g/dL (ref 3.5–5.2)
Alkaline Phosphatase: 106 U/L (ref 39–117)
Bilirubin, Direct: 0.1 mg/dL (ref 0.0–0.3)
Total Bilirubin: 0.6 mg/dL (ref 0.3–1.2)

## 2011-10-29 LAB — CBC WITH DIFFERENTIAL/PLATELET
Basophils Absolute: 0 10*3/uL (ref 0.0–0.1)
Basophils Relative: 0.5 % (ref 0.0–3.0)
Eosinophils Absolute: 0.1 10*3/uL (ref 0.0–0.7)
Lymphocytes Relative: 28.3 % (ref 12.0–46.0)
MCHC: 32.5 g/dL (ref 30.0–36.0)
MCV: 88.7 fl (ref 78.0–100.0)
Monocytes Absolute: 0.6 10*3/uL (ref 0.1–1.0)
Neutrophils Relative %: 56.2 % (ref 43.0–77.0)
RBC: 4.89 Mil/uL (ref 4.22–5.81)
RDW: 15.3 % — ABNORMAL HIGH (ref 11.5–14.6)

## 2011-10-29 LAB — BASIC METABOLIC PANEL
BUN: 17 mg/dL (ref 6–23)
CO2: 31 mEq/L (ref 19–32)
Calcium: 9.1 mg/dL (ref 8.4–10.5)
Chloride: 105 mEq/L (ref 96–112)
Creatinine, Ser: 0.9 mg/dL (ref 0.4–1.5)
Glucose, Bld: 89 mg/dL (ref 70–99)

## 2011-10-29 LAB — LIPID PANEL
LDL Cholesterol: 83 mg/dL (ref 0–99)
Total CHOL/HDL Ratio: 3
Triglycerides: 72 mg/dL (ref 0.0–149.0)

## 2011-10-29 NOTE — Assessment & Plan Note (Signed)
Patient is a past history of hypercholesterolemia.  He is intolerant of statins but is able to take Zetia.  Blood work today is pending

## 2011-10-29 NOTE — Patient Instructions (Signed)
Will obtain labs today and call you with the result Your physician recommends that you continue on your current medications as directed. Please refer to the Current Medication list given to you today. Your physician wants you to follow-up in: 4 months You will receive a reminder letter in the mail two months in advance. If you don't receive a letter, please call our office to schedule the follow-up appointment.

## 2011-10-29 NOTE — Assessment & Plan Note (Signed)
The patient has not been experiencing any angina pectoris.  He exercises at a[employer] gymnasium 3 times a week.

## 2011-10-29 NOTE — Assessment & Plan Note (Signed)
The patient has a history of dermatitis herpetiformis and celiac disease and is on a celiac exclusion diet.  Her checking a CBC today since he is on long-term dapsone.

## 2011-10-29 NOTE — Progress Notes (Signed)
[name] Date of Birth:  July 27, 1941 [facility] Cardiology / [facility] [address]   [address] [place], [place]  [address] [phone]           Fax   [phone]  History of Present Illness: This pleasant 70 year old retired gentleman is seen for a scheduled followup office visit.  As a complex past medical history.  He has a history of a remote inferior wall myocardial infarction in 1986 treated with emergency angioplasty.  He had a subsequent angioplasty in 2002.  He was hospitalized on 10/27/10 with recurrent syncopal episodes and was found to be having long periods of asystole and had a dual chamber pacemaker insertion.  He has had no further syncope.  He has severe Parkinson's.  He has problems with dermatitis herpetiformis.  He has significant Parkinson's disease and has had a previous brain stimulator implanted at [facility] for this.  He has problems with BPH and is followed by his urologist  Current Outpatient Prescriptions  Medication Sig Dispense Refill  . amantadine (SYMMETREL) 100 MG capsule Take 100 mg by mouth 2 (two) times daily.        Marland Kitchen aspirin 81 MG tablet Take 81 mg by mouth daily.        . carbidopa-levodopa (SINEMET) 25-100 MG per tablet Take 1 tablet by mouth at bedtime.        . Carbidopa-Levodopa-Entacapone (STALEVO 200 PO) Take 200 mg by mouth 4 (four) times daily.        . Cholecalciferol (VITAMIN D3) 3000 UNITS TABS Take 2,000 Units by mouth daily.        . dapsone 25 MG tablet Take 25 mg by mouth 2 (two) times daily.        Marland Kitchen donepezil (ARICEPT) 10 MG tablet Take 10 mg by mouth daily.        Marland Kitchen escitalopram (LEXAPRO) 10 MG tablet Take 10 mg by mouth daily.        Marland Kitchen ezetimibe (ZETIA) 10 MG tablet Take 10 mg by mouth daily.        . nitroGLYCERIN (NITROSTAT) 0.4 MG SL tablet Place 0.4 mg under the tongue every 5 (five) minutes as needed.        Marland Kitchen QUEtiapine (SEROQUEL) 50 MG tablet Take 25 mg by mouth at bedtime.       . Tamsulosin HCl (FLOMAX)  0.4 MG CAPS Take by mouth daily.        . VESICARE 5 MG tablet Take 5 mg by mouth daily.         Allergies  Allergen Reactions  . Bactrim   . Crestor (Rosuvastatin Calcium)   . Gluten   . Sulfamethoxazole W/Trimethoprim     Patient Active Problem List  Diagnoses  . PARKINSON'S DISEASE  . MYOCARDIAL INFARCTION  . DERMATITIS HERPETIFORMIS  . SLEEP APNEA  . ATRIAL FIBRILLATION  . CARDIAC PACEMAKER IN SITU  . Hyperlipidemia  . CAD (coronary artery disease)  . Dementia  . Hematoma of chest wall    History  Smoking status  . Never Smoker   Smokeless tobacco  . Not on file    History  Alcohol Use: Not on file    Family History  Problem Relation Age of Onset  . Heart attack Mother   . Heart disease Mother   . Hypertension Mother   . Heart attack Father   . Other Father     renal calculi    Review of Systems: Constitutional: no fever  chills diaphoresis or fatigue or change in weight.  Head and neck: no hearing loss, no epistaxis, no photophobia or visual disturbance. Respiratory: No cough, shortness of breath or wheezing. Cardiovascular: No chest pain peripheral edema, palpitations. Gastrointestinal: No abdominal distention, no abdominal pain, no change in bowel habits hematochezia or melena. Genitourinary: No dysuria, no frequency, no urgency, no nocturia. Musculoskeletal:No arthralgias, no back pain, no gait disturbance or myalgias. Neurological: No dizziness, no headaches, no numbness, no seizures, no syncope, no weakness, no tremors. Hematologic: No lymphadenopathy, no easy bruising. Psychiatric: No confusion, no hallucinations, no sleep disturbance.    Physical Exam: Filed Vitals:   10/29/11 0846  BP: 100/78  Pulse: 66   the general appearance reveals a well-developed well-nourished gentleman in no distress.The head and neck exam reveals pupils equal and reactive.  Extraocular movements are full.  There is no scleral icterus.  The mouth and pharynx are  normal.  The neck is supple.  The carotids reveal no bruits.  The jugular venous pressure is normal.  The  thyroid is not enlarged.  There is no lymphadenopathy.  The chest is clear to percussion and auscultation.  There are no rales or rhonchi.  Expansion of the chest is symmetrical.  The precordium is quiet.  The first heart sound is normal.  The second heart sound is physiologically split.  There is no murmur gallop rub or click.  There is no abnormal lift or heave.  The abdomen is soft and nontender.  The bowel sounds are normal.  The liver and spleen are not enlarged.  There are no abdominal masses.  There are no abdominal bruits.  Extremities reveal good pedal pulses.  There is no phlebitis or edema.  There is no cyanosis or clubbing.  Strength is normal and symmetrical in all extremities.  There is no lateralizing weakness.  There are no sensory deficits.  The patient does have significant disorder of Parkinson's disease and his speech is very slurred as a result of his Parkinson  The skin is warm and dry.  There is no rash.     Assessment / Plan: Continue same medication and be rechecked in 4 months for followup office visit EKG lipid panel hepatic function panel nasal metabolic panel and CBC.

## 2011-10-31 ENCOUNTER — Telehealth: Payer: Self-pay | Admitting: *Deleted

## 2011-10-31 NOTE — Telephone Encounter (Signed)
Message copied by Burnell Blanks on Fri Oct 31, 2011  4:55 PM ------      Message from: Cassell Clement      Created: Thu Oct 30, 2011  1:20 PM       Please report.  The labs are stable.  Continue same meds.  Continue careful diet.  Send copy of CBC to Dr. Danella Deis.

## 2011-10-31 NOTE — Telephone Encounter (Signed)
Advised wife, mailed copy, and sent copy to Dr Danella Deis

## 2011-11-06 ENCOUNTER — Encounter: Payer: Self-pay | Admitting: Internal Medicine

## 2011-11-06 ENCOUNTER — Ambulatory Visit (INDEPENDENT_AMBULATORY_CARE_PROVIDER_SITE_OTHER): Payer: Medicare Other | Admitting: Internal Medicine

## 2011-11-06 ENCOUNTER — Encounter: Payer: BC Managed Care – PPO | Admitting: Internal Medicine

## 2011-11-06 DIAGNOSIS — I442 Atrioventricular block, complete: Secondary | ICD-10-CM | POA: Insufficient documentation

## 2011-11-06 DIAGNOSIS — I4891 Unspecified atrial fibrillation: Secondary | ICD-10-CM

## 2011-11-06 LAB — PACEMAKER DEVICE OBSERVATION
BAMS-0001: 175 {beats}/min
BATTERY VOLTAGE: 2.78 V
RV LEAD AMPLITUDE: 11.2 mv
RV LEAD THRESHOLD: 0.625 V

## 2011-11-06 NOTE — Patient Instructions (Signed)
Your physician wants you to follow-up in: 6 months with device clinic You will receive a reminder letter in the mail two months in advance. If you don't receive a letter, please call our office to schedule the follow-up appointment.  

## 2011-11-09 ENCOUNTER — Encounter: Payer: Self-pay | Admitting: Internal Medicine

## 2011-11-09 NOTE — Assessment & Plan Note (Signed)
He is in sinus today. Pacemaker interrogation reveals no significant afib since last interrogation

## 2011-11-09 NOTE — Progress Notes (Signed)
PCP:  Neena Rhymes, MD, MD  The patient presents today for routine electrophysiology followup.  Since last being seen in our clinic, the patient reports doing reasonably well.  He has advanced parkinsons disease. Today, he denies symptoms of palpitations, chest pain, shortness of breath,dizziness, presyncope, or further syncope since his pacemaker was implanted.  The patient feels that he is tolerating medications without difficulties and is otherwise without complaint today.   Past Medical History  Diagnosis Date  . Parkinson disease   . Bradycardia     syncope due to prolonged pauses, resolved with PPM  . Sleep apnea   . Hypertension   . Dermatitis   . Depression   . Rib fractures 2011    S/p fall  . Maxillary fracture 2009    Right  . BPH (benign prostatic hypertrophy)   . Coronary artery disease 1986    Inferior wall MI treated with Angioplasty  . Normal echocardiogram 2007    Mild LVH with Impaired relaxation, Mild-Mod. Aortic Root dilation, Mild Aortic Sclerosis, Mild MR, Mild TR, Ef-50-55%  . Normal nuclear stress test 2006    Normal EF-54%  . CAD (coronary artery disease)   . Inferior MI   . 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 Maxillary fracture 2009    Right side  . Rib fracture 07/2010  . Atrioventricular block, complete    Past Surgical History  Procedure Date  . Pacemaker insertion 10/28/10    MDT implanted by Dr Ladona Ridgel  . Deep brain stimulator placement     Parkinsons  . Arthroscopic knee     Left knee  . Cataract surgery     Bilateral  . Inguinal hernia repair     Bilateral  . Cardiac catheterization     Multiple Angioplasties, last 2002  . Removal of ganglion cyst     right wrist    Current Outpatient Prescriptions  Medication Sig Dispense Refill  . amantadine (SYMMETREL) 100 MG capsule Take 100 mg by mouth 2 (two) times daily.        Marland Kitchen aspirin 81 MG tablet Take 81 mg by mouth daily.        . carbidopa-levodopa (SINEMET) 25-100 MG per  tablet Take 1 tablet by mouth at bedtime.        . Carbidopa-Levodopa-Entacapone (STALEVO 200 PO) Take 200 mg by mouth 4 (four) times daily.        . Cholecalciferol (VITAMIN D3) 3000 UNITS TABS Take 2,000 Units by mouth daily.        . dapsone 25 MG tablet Take 25 mg by mouth 2 (two) times daily.        Marland Kitchen donepezil (ARICEPT) 10 MG tablet Take 10 mg by mouth daily.        Marland Kitchen escitalopram (LEXAPRO) 10 MG tablet Take 10 mg by mouth daily.        Marland Kitchen ezetimibe (ZETIA) 10 MG tablet Take 10 mg by mouth daily.        . nitroGLYCERIN (NITROSTAT) 0.4 MG SL tablet Place 0.4 mg under the tongue every 5 (five) minutes as needed.        Marland Kitchen QUEtiapine (SEROQUEL) 50 MG tablet Take 25 mg by mouth at bedtime.       . Tamsulosin HCl (FLOMAX) 0.4 MG CAPS Take by mouth daily.          Allergies  Allergen Reactions  . Bactrim   . Crestor (Rosuvastatin Calcium)   . Gluten   .  Sulfamethoxazole W/Trimethoprim     History   Social History  . Marital Status: Married    Spouse Name: N/A    Number of Children: N/A  . Years of Education: N/A   Occupational History  . Not on file.   Social History Main Topics  . Smoking status: Never Smoker   . Smokeless tobacco: Not on file  . Alcohol Use: Not on file  . Drug Use: Not on file  . Sexually Active: Not on file   Other Topics Concern  . Not on file   Social History Narrative  . No narrative on file    Family History  Problem Relation Age of Onset  . Heart attack Mother   . Heart disease Mother   . Hypertension Mother   . Heart attack Father   . Other Father     renal calculi    ROS-  All systems are reviewed and are negative except as outlined in the HPI above   Physical Exam: Filed Vitals:   11/06/11 1602  BP: 139/85  Pulse: 78  Weight: 175 lb (79.379 kg)    GEN- The patient is chronically ill appearing, alert and oriented x 3 today.   Head- normocephalic, atraumatic Eyes-  Sclera clear, conjunctiva pink Ears- hearing  intact Oropharynx- clear Lungs- Clear to ausculation bilaterally, normal work of breathing Chest- pacemaker pocket is well healed Heart- Regular rate and rhythm, no murmurs, rubs or gallops, PMI not laterally displaced GI- soft, NT, ND, + BS Extremities- no clubbing, cyanosis, or edema  Pacemaker interrogation- reviewed in detail today,  See PACEART report  Assessment and Plan:

## 2011-11-09 NOTE — Assessment & Plan Note (Signed)
Normal pacemaker function See Pace Art report No changes today  

## 2011-11-12 ENCOUNTER — Encounter: Payer: Self-pay | Admitting: Cardiology

## 2012-01-27 ENCOUNTER — Other Ambulatory Visit: Payer: Self-pay | Admitting: Cardiology

## 2012-02-09 ENCOUNTER — Other Ambulatory Visit (INDEPENDENT_AMBULATORY_CARE_PROVIDER_SITE_OTHER): Payer: Medicare Other

## 2012-02-09 DIAGNOSIS — G20 Parkinson's disease: Secondary | ICD-10-CM

## 2012-02-09 DIAGNOSIS — E7800 Pure hypercholesterolemia, unspecified: Secondary | ICD-10-CM

## 2012-02-09 DIAGNOSIS — G20A1 Parkinson's disease without dyskinesia, without mention of fluctuations: Secondary | ICD-10-CM

## 2012-02-09 LAB — HEPATIC FUNCTION PANEL
ALT: 10 U/L (ref 0–53)
AST: 27 U/L (ref 0–37)
Albumin: 4.2 g/dL (ref 3.5–5.2)
Alkaline Phosphatase: 86 U/L (ref 39–117)
Bilirubin, Direct: 0.1 mg/dL (ref 0.0–0.3)
Total Bilirubin: 0.5 mg/dL (ref 0.3–1.2)
Total Protein: 7.1 g/dL (ref 6.0–8.3)

## 2012-02-09 LAB — BASIC METABOLIC PANEL
BUN: 13 mg/dL (ref 6–23)
CO2: 33 mEq/L — ABNORMAL HIGH (ref 19–32)
Calcium: 9.5 mg/dL (ref 8.4–10.5)
Chloride: 103 mEq/L (ref 96–112)
Creatinine, Ser: 0.9 mg/dL (ref 0.4–1.5)
GFR: 87.87 mL/min (ref >60.00)
Glucose, Bld: 91 mg/dL (ref 70–99)
Potassium: 4.5 mEq/L (ref 3.5–5.1)
Sodium: 142 mEq/L (ref 135–145)

## 2012-02-09 LAB — CBC WITH DIFFERENTIAL/PLATELET
Basophils Absolute: 0 10*3/uL (ref 0.0–0.1)
Hemoglobin: 14.5 g/dL (ref 13.0–17.0)
Lymphocytes Relative: 40.4 % (ref 12.0–46.0)
Monocytes Relative: 15 % — ABNORMAL HIGH (ref 3.0–12.0)
Neutro Abs: 1.4 10*3/uL (ref 1.4–7.7)
Neutrophils Relative %: 38.6 % — ABNORMAL LOW (ref 43.0–77.0)
Platelets: 283 10*3/uL (ref 150.0–400.0)
RDW: 14.8 % — ABNORMAL HIGH (ref 11.5–14.6)

## 2012-02-09 LAB — LIPID PANEL
LDL Cholesterol: 72 mg/dL (ref 0–99)
Total CHOL/HDL Ratio: 3

## 2012-02-09 NOTE — Progress Notes (Signed)
Quick Note:  Please make copy of labs for patient visit. ______ 

## 2012-02-17 ENCOUNTER — Telehealth: Payer: Self-pay | Admitting: Cardiology

## 2012-02-17 NOTE — Telephone Encounter (Signed)
Left message labs faxed to Dr Nino Parsley office

## 2012-02-17 NOTE — Telephone Encounter (Signed)
New Problem:    Patient's wife called wanting his last lab work to be faxed over to Dr. Alesia Morin Office so they can prescribe his skin disease medication.  Please call back if you have any questions.

## 2012-03-01 ENCOUNTER — Other Ambulatory Visit: Payer: Self-pay | Admitting: Cardiology

## 2012-03-04 ENCOUNTER — Ambulatory Visit (INDEPENDENT_AMBULATORY_CARE_PROVIDER_SITE_OTHER): Payer: Medicare Other | Admitting: Cardiology

## 2012-03-04 ENCOUNTER — Encounter: Payer: Self-pay | Admitting: Cardiology

## 2012-03-04 VITALS — BP 128/86 | HR 76 | Ht 71.0 in | Wt 174.0 lb

## 2012-03-04 DIAGNOSIS — H43819 Vitreous degeneration, unspecified eye: Secondary | ICD-10-CM

## 2012-03-04 DIAGNOSIS — G20 Parkinson's disease: Secondary | ICD-10-CM

## 2012-03-04 DIAGNOSIS — I219 Acute myocardial infarction, unspecified: Secondary | ICD-10-CM

## 2012-03-04 DIAGNOSIS — E785 Hyperlipidemia, unspecified: Secondary | ICD-10-CM

## 2012-03-04 DIAGNOSIS — I442 Atrioventricular block, complete: Secondary | ICD-10-CM

## 2012-03-04 DIAGNOSIS — I2510 Atherosclerotic heart disease of native coronary artery without angina pectoris: Secondary | ICD-10-CM

## 2012-03-04 DIAGNOSIS — H5210 Myopia, unspecified eye: Secondary | ICD-10-CM | POA: Insufficient documentation

## 2012-03-04 DIAGNOSIS — Z961 Presence of intraocular lens: Secondary | ICD-10-CM | POA: Insufficient documentation

## 2012-03-04 DIAGNOSIS — H02839 Dermatochalasis of unspecified eye, unspecified eyelid: Secondary | ICD-10-CM | POA: Insufficient documentation

## 2012-03-04 DIAGNOSIS — I4891 Unspecified atrial fibrillation: Secondary | ICD-10-CM

## 2012-03-04 DIAGNOSIS — E7800 Pure hypercholesterolemia, unspecified: Secondary | ICD-10-CM

## 2012-03-04 HISTORY — DX: Vitreous degeneration, unspecified eye: H43.819

## 2012-03-04 MED ORDER — NITROGLYCERIN 0.4 MG SL SUBL: 0.4000 mg | SUBLINGUAL_TABLET | SUBLINGUAL | Status: DC | PRN

## 2012-03-04 NOTE — Assessment & Plan Note (Signed)
The Parkinson's disease causes him to have to walk with a rolling walker for balance.  He is no longer able to play golf because of his Parkinson's.  He does go to a physical fitness center 3-5 times a week for exercise classes and training

## 2012-03-04 NOTE — Patient Instructions (Signed)
Your physician recommends that you continue on your current medications as directed. Please refer to the Current Medication list given to you today.  Your physician recommends that you schedule a follow-up appointment in: 4 months with fasting labs (lp/bmet/hfp/cbc)  

## 2012-03-04 NOTE — Progress Notes (Signed)
Patient ID: [name], male   DOB: 1941-10-11, 71 y.o.   MRN: [id number]

## 2012-03-04 NOTE — Progress Notes (Addendum)
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  He has a complex past medical history.  He has a history of a remote inferior wall myocardial infarction in 1986.  He was treated with emergency angioplasty.  He had subsequent angioplasty in 2002.  He was hospitalized on 10/27/10 with recurrent syncopal episodes and was found to have long periods of asystole.  He had a dual-chamber pacemaker inserted.  Since then he said no further syncope.  The patient has a history of gluten intolerance.  Patient also has a history of dermatitis herpetiformis.  This is followed by Dr. Danella Deis.  He is on dapsone and we monitor his CBC.  Patient has severe Parkinson's and has had a brain stimulator implanted at [facility].  Current Outpatient Prescriptions  Medication Sig Dispense Refill  . amantadine (SYMMETREL) 100 MG capsule Take 100 mg by mouth 2 (two) times daily.        Marland Kitchen aspirin 81 MG tablet Take 81 mg by mouth daily.        . carbidopa-levodopa (SINEMET) 25-100 MG per tablet Take 1 tablet by mouth at bedtime.        . Carbidopa-Levodopa-Entacapone (STALEVO 200 PO) Take 200 mg by mouth 4 (four) times daily.        . Cholecalciferol (VITAMIN D3) 3000 UNITS TABS Take 2,000 Units by mouth daily.        . dapsone 25 MG tablet Take 25 mg by mouth 2 (two) times daily.        Marland Kitchen donepezil (ARICEPT) 10 MG tablet Take 10 mg by mouth daily.        Marland Kitchen escitalopram (LEXAPRO) 10 MG tablet Take 10 mg by mouth daily.        . nitroGLYCERIN (NITROSTAT) 0.4 MG SL tablet Place 1 tablet (0.4 mg total) under the tongue every 5 (five) minutes as needed.  100 tablet  3  . QUEtiapine (SEROQUEL) 50 MG tablet Take 25 mg by mouth at bedtime.       . Tamsulosin HCl (FLOMAX) 0.4 MG CAPS Take by mouth  daily.        Marland Kitchen ZETIA 10 MG tablet TAKE 1 TABLET BY MOUTH EVERY DAY  90 tablet  2  . DISCONTD: nitroGLYCERIN (NITROSTAT) 0.4 MG SL tablet Place 0.4 mg under the tongue every 5 (five) minutes as needed.        Marland Kitchen DISCONTD: nitroGLYCERIN (NITROSTAT) 0.4 MG SL tablet Place 1 tablet (0.4 mg total) under the tongue every 5 (five) minutes as needed.  100 tablet  3    Allergies  Allergen Reactions  . Bactrim   . Crestor (Rosuvastatin Calcium)   . Gluten   . Sulfamethoxazole W/Trimethoprim     Patient Active Problem List  Diagnoses  . PARKINSON'S DISEASE  . MYOCARDIAL INFARCTION  . DERMATITIS HERPETIFORMIS  . SLEEP APNEA  . ATRIAL FIBRILLATION  . CARDIAC PACEMAKER IN SITU  . Hyperlipidemia  . CAD (coronary artery disease)  . Dementia  . Hematoma of chest wall  . Atrioventricular block, complete    History  Smoking status  . Never Smoker   Smokeless tobacco  . Not on file    History  Alcohol Use: Not on file    Family History  Problem Relation Age of Onset  . Heart attack Mother   . Heart disease Mother   . Hypertension Mother   . Heart attack Father   . Other Father     renal calculi    Review of Systems: Constitutional: no fever chills diaphoresis or fatigue or change in weight.  Head and neck: no hearing loss, no epistaxis, no photophobia or visual disturbance. Respiratory: No cough, shortness of breath or wheezing. Cardiovascular: No chest pain peripheral edema, palpitations. Gastrointestinal: No abdominal distention, no abdominal pain, no change in bowel habits hematochezia or melena. Genitourinary: No dysuria, no frequency, no urgency, no nocturia. Musculoskeletal:No arthralgias, no back pain, no gait disturbance or myalgias. Neurological: No dizziness, no headaches, no numbness, no seizures, no syncope, no weakness, no tremors. Hematologic: No lymphadenopathy, no easy bruising. Psychiatric: No confusion, no hallucinations, no sleep disturbance.    Physical  Exam: Filed Vitals:   03/04/12 1334  BP: 128/86  Pulse: 76   the general appearance reveals a well-developed well-nourished gentleman with obvious Parkinson's.Pupils equal and reactive.   Extraocular Movements are full.  There is no scleral icterus.  The mouth and pharynx are normal.  The neck is supple.  The carotids reveal no bruits.  The jugular venous pressure is normal.  The thyroid is not enlarged.  There is no lymphadenopathy.  The chest is clear to percussion and auscultation. There are no rales or rhonchi. Expansion of the chest is symmetrical.  He is cardiac pacemaker is on the right thorax and his brain stimulator is situated on the left thorax.The precordium is quiet.  The first heart sound is normal.  The second heart sound is physiologically split.  There is no murmur gallop rub or click.  There is no abnormal lift or heave.  The abdomen is soft and nontender. Bowel sounds are normal. The liver and spleen are not enlarged. There Are no abdominal masses. There are no bruits.  The pedal pulses are good.  There is no phlebitis or edema.  There is no cyanosis or clubbing. Neurologic exam reveals shuffling gait of Parkinson's and slurred speech of Parkinson's. Neuromuscular reveals that he has a prominent scoliosis curve.   EKG today shows AV dual paced rhythm with frequent PVCs and trigeminy  Assessment / Plan:  Continue same medication.  Recheck in 4 months for followup office visit CBC and fasting lab work

## 2012-03-04 NOTE — Assessment & Plan Note (Signed)
His pacemaker has been functioning well.  He has had no syncope since implantation of his pacemaker.

## 2012-03-04 NOTE — Assessment & Plan Note (Signed)
The patient is intolerant of statins but is able to take ezetimibe.  His blood work is satisfactory and was reviewed with him today

## 2012-03-04 NOTE — Assessment & Plan Note (Signed)
Patient has not been experiencing any angina pectoris.  His nitroglycerin are outdated and we called in a new supply for him today.

## 2012-06-09 ENCOUNTER — Telehealth: Payer: Self-pay | Admitting: Cardiology

## 2012-06-09 NOTE — Telephone Encounter (Signed)
Please return call to patient wife [phone] regarding medical concerns

## 2012-06-09 NOTE — Telephone Encounter (Signed)
Patient works out with the Parkinson's group and before exercise he became white and clammy.  The people at the gym said heart rate low, but patient has a pacemaker.  They called 911 and when EMS got there his heart rate, blood pressure, and EKG ok.  Patient didn't want to go to ED. Per wife he seems to be feeling better now but a little dizzy.  Scheduled appointment to see Lawson Fiscal NP in am

## 2012-06-10 ENCOUNTER — Encounter: Payer: Self-pay | Admitting: Internal Medicine

## 2012-06-10 ENCOUNTER — Ambulatory Visit (INDEPENDENT_AMBULATORY_CARE_PROVIDER_SITE_OTHER): Payer: Medicare Other | Admitting: Nurse Practitioner

## 2012-06-10 ENCOUNTER — Ambulatory Visit (INDEPENDENT_AMBULATORY_CARE_PROVIDER_SITE_OTHER): Payer: Medicare Other | Admitting: *Deleted

## 2012-06-10 ENCOUNTER — Encounter: Payer: Self-pay | Admitting: Nurse Practitioner

## 2012-06-10 VITALS — BP 102/70 | HR 62 | Ht 71.0 in | Wt 167.0 lb

## 2012-06-10 DIAGNOSIS — I442 Atrioventricular block, complete: Secondary | ICD-10-CM

## 2012-06-10 DIAGNOSIS — R42 Dizziness and giddiness: Secondary | ICD-10-CM

## 2012-06-10 DIAGNOSIS — I2510 Atherosclerotic heart disease of native coronary artery without angina pectoris: Secondary | ICD-10-CM

## 2012-06-10 DIAGNOSIS — Z950 Presence of cardiac pacemaker: Secondary | ICD-10-CM

## 2012-06-10 LAB — BASIC METABOLIC PANEL
BUN: 16 mg/dL (ref 6–23)
CO2: 31 mEq/L (ref 19–32)
Calcium: 9.6 mg/dL (ref 8.4–10.5)
Chloride: 101 mEq/L (ref 96–112)
Creatinine, Ser: 0.8 mg/dL (ref 0.4–1.5)
GFR: 108.35 mL/min (ref >60.00)
Glucose, Bld: 83 mg/dL (ref 70–99)
Potassium: 4.3 mEq/L (ref 3.5–5.1)
Sodium: 139 mEq/L (ref 135–145)

## 2012-06-10 LAB — PACEMAKER DEVICE OBSERVATION
AL AMPLITUDE: 2 mv
AL IMPEDENCE PM: 453 Ohm
AL THRESHOLD: 1.875 V
RV LEAD AMPLITUDE: 15.68 mv
RV LEAD IMPEDENCE PM: 557 Ohm

## 2012-06-10 LAB — CBC WITH DIFFERENTIAL/PLATELET
Basophils Absolute: 0 10*3/uL (ref 0.0–0.1)
Basophils Relative: 0.8 % (ref 0.0–3.0)
Eosinophils Absolute: 0.1 10*3/uL (ref 0.0–0.7)
Eosinophils Relative: 2.2 % (ref 0.0–5.0)
HCT: 41.8 % (ref 39.0–52.0)
Hemoglobin: 13.5 g/dL (ref 13.0–17.0)
Lymphocytes Relative: 22.2 % (ref 12.0–46.0)
Lymphs Abs: 1.4 10*3/uL (ref 0.7–4.0)
MCHC: 32.2 g/dL (ref 30.0–36.0)
MCV: 89.7 fl (ref 78.0–100.0)
Monocytes Absolute: 0.8 10*3/uL (ref 0.1–1.0)
Monocytes Relative: 12.1 % — ABNORMAL HIGH (ref 3.0–12.0)
Neutro Abs: 3.9 10*3/uL (ref 1.4–7.7)
Neutrophils Relative %: 62.7 % (ref 43.0–77.0)
Platelets: 257 10*3/uL (ref 150.0–400.0)
RBC: 4.66 Mil/uL (ref 4.22–5.81)
RDW: 15.3 % — ABNORMAL HIGH (ref 11.5–14.6)
WBC: 6.3 10*3/uL (ref 4.5–10.5)

## 2012-06-10 NOTE — Progress Notes (Signed)
[name] Date of Birth: Nov 04, 1941 Medical Record #[id number]  History of Present Illness: [name] is seen today for a work in visit. He is seen for [name]. He is a retired [name] fundraiser. He has known CAD with a remote inferior MI in 1986, treated with emergent angioplasty. He had repeat angioplasty in 2002. Had syncope in December of 2011 and found to have long periods of asystole and got a DDD pacemaker implanted. He is gluten intolerant. Has a history of dermatitis herpetiformis followed by [name]. He is on chronic dapsone and has to have his CBC monitored. He does have severe dementia as well as severe Parkinson's and has a brain stimulator in place.  He comes in today. He is here with his wife. He had been working out with his Parkinson's group twice a week. He will typically go and warm up on the bicycle before he starts the group exercise. He did this yesterday. But before he actually exercised with the other participates, they noted that he became white and clammy. The people at the gym said his heart rate was low, but his has his pacemaker in place. Said his oxygen level was low. ? If it was even accurate. He did not want to go to the ER. EMS was called and his heart rate, BP and EKG were ok by them. He was brought here today for follow up. It is reported that his wife stopped his Lexapro "cold [place]". But this was over one week ago. She thought he was on too much medicine. They have been advised in the past to increase his salt but doesn't sound like he really has been doing that. No chest pain reported. Not short of breath. He is falling more. Remains dizzy and was dizzy before the Lexapro was stopped.   Current Outpatient Prescriptions on File Prior to Visit  Medication Sig Dispense Refill  . amantadine (SYMMETREL) 100 MG capsule Take 100 mg by mouth 2 (two) times daily.        Marland Kitchen aspirin 81 MG tablet Take 81 mg by mouth daily.        . carbidopa-levodopa (SINEMET) 25-100 MG  per tablet Take 1 tablet by mouth at bedtime.        . Carbidopa-Levodopa-Entacapone (STALEVO 200 PO) Take 200 mg by mouth 4 (four) times daily.        . Cholecalciferol (VITAMIN D3) 3000 UNITS TABS Take 2,000 Units by mouth daily.        . dapsone 25 MG tablet Take 25 mg by mouth 2 (two) times daily.        Marland Kitchen donepezil (ARICEPT) 10 MG tablet Take 10 mg by mouth daily.        . nitroGLYCERIN (NITROSTAT) 0.4 MG SL tablet Place 1 tablet (0.4 mg total) under the tongue every 5 (five) minutes as needed.  100 tablet  3  . QUEtiapine (SEROQUEL) 50 MG tablet Take 25 mg by mouth at bedtime.       . Tamsulosin HCl (FLOMAX) 0.4 MG CAPS Take by mouth daily.        Marland Kitchen ZETIA 10 MG tablet TAKE 1 TABLET BY MOUTH EVERY DAY  90 tablet  2  . escitalopram (LEXAPRO) 10 MG tablet Take 10 mg by mouth daily.          Allergies  Allergen Reactions  . Bactrim   . Crestor (Rosuvastatin Calcium)   . Gluten   . Sulfamethoxazole W-Trimethoprim  Past Medical History  Diagnosis Date  . Parkinson disease   . Bradycardia     syncope due to prolonged pauses, resolved with PPM  . Sleep apnea   . Hypertension   . Dermatitis   . Depression   . Rib fractures 2011    S/p fall  . Maxillary fracture 2009    Right  . BPH (benign prostatic hypertrophy)   . Coronary artery disease 1986    Inferior wall MI treated with Angioplasty  . Normal echocardiogram 2007    Mild LVH with Impaired relaxation, Mild-Mod. Aortic Root dilation, Mild Aortic Sclerosis, Mild MR, Mild TR, Ef-50-55%  . Normal nuclear stress test 2006    Normal EF-54%  . CAD (coronary artery disease)   . Inferior MI   . 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 Maxillary fracture 2009    Right side  . Rib fracture 07/2010  . Atrioventricular block, complete     Past Surgical History  Procedure Date  . Pacemaker insertion 10/28/10    MDT implanted by [name]  . Deep brain stimulator placement     Parkinsons  . Arthroscopic knee     Left  knee  . Cataract surgery     Bilateral  . Inguinal hernia repair     Bilateral  . Cardiac catheterization     Multiple Angioplasties, last 2002  . Removal of ganglion cyst     right wrist    History  Smoking status  . Never Smoker   Smokeless tobacco  . Not on file    History  Alcohol Use No    Family History  Problem Relation Age of Onset  . Heart attack Mother   . Heart disease Mother   . Hypertension Mother   . Heart attack Father   . Other Father     renal calculi    Review of Systems: The review of systems is per the HPI.  All other systems were reviewed and are negative.  Physical Exam: BP 102/70  Pulse 62  Ht 5\' 11"  (1.803 m)  Wt 167 lb (75.751 kg)  BMI 23.29 kg/m2 Patient is very pleasant and in no acute distress. He does shake from his Parkinson's. Using a walker. Skin is warm and dry. Color is normal.  HEENT is unremarkable. Normocephalic/atraumatic. PERRL. Sclera are nonicteric. Neck is supple. No masses. No JVD. Lungs are clear. Cardiac exam shows a regular rate and rhythm. Abdomen is soft. Extremities are without edema. Gait and ROM are intact. No gross neurologic deficits noted.   LABORATORY DATA: Pending for today.   Pacemaker is checked today and is satisfactory. He is primarily AV paced.   EKG today shows a paced rhythm.  Lab Results  Component Value Date   WBC 3.5* 02/09/2012   HGB 14.5 02/09/2012   HCT 44.5 02/09/2012   PLT 283.0 02/09/2012   GLUCOSE 91 02/09/2012   CHOL 135 02/09/2012   TRIG 56.0 02/09/2012   HDL 51.90 02/09/2012   LDLCALC 72 02/09/2012   ALT 10 02/09/2012   AST 27 02/09/2012   NA 142 02/09/2012   K 4.5 02/09/2012   CL 103 02/09/2012   CREATININE 0.9 02/09/2012   BUN 13 02/09/2012   CO2 33* 02/09/2012   TSH 3.902 11/11/2010   INR 1.15 10/27/2010    Assessment / Plan:

## 2012-06-10 NOTE — Progress Notes (Signed)
Pacer check in clinic  

## 2012-06-10 NOTE — Assessment & Plan Note (Signed)
No chest pain reported 

## 2012-06-10 NOTE — Assessment & Plan Note (Signed)
Patient presents after an episode of dizziness that was associated with clamminess and turning white. His pacemaker check is normal. His EKG is a paced rhythm. I suspect this is more related to his Parkinson's. BP is on the low side here today. They report average readings 120 or lower at home. I have suggested that he increase his salt use, especially just prior to exercising. We will check some baseline labs today. May need to consider Midodrine on return. He is to see Dr. Patty Sermons later this month. Patient is agreeable to this plan and will call if any problems develop in the interim.

## 2012-06-10 NOTE — Assessment & Plan Note (Signed)
His pacemaker check is normal today.

## 2012-06-10 NOTE — Patient Instructions (Addendum)
Keep your next appointment with Dr. Rodney Langton may liberalize your salt use, especially on the days you exercise  You may resume your exercise program  Monitor your blood pressure at home and keep a diary.  Your pacemaker check is just fine.  We will check labs today as well.  Call the [facility] office at [phone] if you have any questions, problems or concerns.

## 2012-06-11 ENCOUNTER — Telehealth: Payer: Self-pay | Admitting: *Deleted

## 2012-06-11 NOTE — Telephone Encounter (Signed)
Advised wife of labs 

## 2012-06-11 NOTE — Telephone Encounter (Signed)
Message copied by Burnell Blanks on Fri Jun 11, 2012  2:52 PM ------      Message from: Rosalio Macadamia      Created: Fri Jun 11, 2012  7:45 AM       Ok to report. Labs are satisfactory.

## 2012-06-24 ENCOUNTER — Ambulatory Visit (INDEPENDENT_AMBULATORY_CARE_PROVIDER_SITE_OTHER): Payer: Medicare Other | Admitting: Cardiology

## 2012-06-24 ENCOUNTER — Encounter: Payer: Self-pay | Admitting: Cardiology

## 2012-06-24 ENCOUNTER — Other Ambulatory Visit (INDEPENDENT_AMBULATORY_CARE_PROVIDER_SITE_OTHER): Payer: Medicare Other

## 2012-06-24 VITALS — BP 136/88 | HR 80 | Ht 71.0 in | Wt 169.0 lb

## 2012-06-24 DIAGNOSIS — G20A1 Parkinson's disease without dyskinesia, without mention of fluctuations: Secondary | ICD-10-CM

## 2012-06-24 DIAGNOSIS — R42 Dizziness and giddiness: Secondary | ICD-10-CM

## 2012-06-24 DIAGNOSIS — I2510 Atherosclerotic heart disease of native coronary artery without angina pectoris: Secondary | ICD-10-CM

## 2012-06-24 DIAGNOSIS — E785 Hyperlipidemia, unspecified: Secondary | ICD-10-CM

## 2012-06-24 DIAGNOSIS — G20 Parkinson's disease: Secondary | ICD-10-CM

## 2012-06-24 LAB — CBC WITH DIFFERENTIAL/PLATELET
Basophils Absolute: 0 10*3/uL (ref 0.0–0.1)
Eosinophils Absolute: 0.2 10*3/uL (ref 0.0–0.7)
Hemoglobin: 13.9 g/dL (ref 13.0–17.0)
Lymphocytes Relative: 37.5 % (ref 12.0–46.0)
Lymphs Abs: 1.3 10*3/uL (ref 0.7–4.0)
MCHC: 32.1 g/dL (ref 30.0–36.0)
MCV: 89.2 fl (ref 78.0–100.0)
Monocytes Absolute: 0.5 10*3/uL (ref 0.1–1.0)
Neutro Abs: 1.4 10*3/uL (ref 1.4–7.7)
RDW: 14.6 % (ref 11.5–14.6)

## 2012-06-24 LAB — HEPATIC FUNCTION PANEL
ALT: 39 U/L (ref 0–53)
Albumin: 4.1 g/dL (ref 3.5–5.2)
Total Bilirubin: 0.6 mg/dL (ref 0.3–1.2)

## 2012-06-24 LAB — BASIC METABOLIC PANEL
BUN: 13 mg/dL (ref 6–23)
Chloride: 101 mEq/L (ref 96–112)
Creatinine, Ser: 0.8 mg/dL (ref 0.4–1.5)
GFR: 106.69 mL/min (ref >60.00)

## 2012-06-24 LAB — LIPID PANEL
Cholesterol: 141 mg/dL (ref 0–200)
LDL Cholesterol: 75 mg/dL (ref 0–99)
Triglycerides: 74 mg/dL (ref 0.0–149.0)

## 2012-06-24 MED ORDER — MECLIZINE HCL 25 MG PO TABS: 25.0000 mg | ORAL_TABLET | Freq: Three times a day (TID) | ORAL | Status: DC | PRN

## 2012-06-24 NOTE — Assessment & Plan Note (Signed)
His Parkinson's disease has been stable on current medication.

## 2012-06-24 NOTE — Progress Notes (Signed)
[name] Date of Birth:  April 30, 1941 [facility] [address] [place], [place]  [address] [phone]         Fax   [phone]  History of Present Illness: This pleasant 71 year old retired [name] fund[employer] is seen for a scheduled four-month followup office visit.  He has a past history of known coronary artery disease with a remote inferior wall myocardial infarction in 1986.  He was treated with emergent angioplasty at that time.  He had a repeat angioplasty in 2002.  He had syncope in December 2011 and had long periods of asystole and has a dual-chamber pacemaker implanted since then he has gluten intolerance.  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 Danella Deis.  He has severe dementia as well as severe Parkinson's and he does have a deep brain stimulator in place on the left side.  2 weeks ago he was seen as a work in after he became pale and clammy while exercising at the gym.  He was seen here 2 weeks ago in followup at which time his pacemaker was interrogated and was found to be functioning normally.  Current Outpatient Prescriptions  Medication Sig Dispense Refill  . amantadine (SYMMETREL) 100 MG capsule Take 100 mg by mouth 2 (two) times daily.        Marland Kitchen aspirin 81 MG tablet Take 81 mg by mouth daily.        . carbidopa-levodopa (SINEMET) 25-100 MG per tablet Take 1 tablet by mouth at bedtime.        . Carbidopa-Levodopa-Entacapone (STALEVO 200 PO) Take 200 mg by mouth 4 (four) times daily.        . Cholecalciferol (VITAMIN D3) 3000 UNITS TABS Take 2,000 Units by mouth daily.        . dapsone 25 MG tablet Take 25 mg by mouth 2 (two) times daily.        Marland Kitchen donepezil (ARICEPT) 10 MG tablet Take 10 mg by mouth daily.        Marland Kitchen escitalopram (LEXAPRO) 10 MG tablet Take 10 mg by mouth daily.        . nitroGLYCERIN (NITROSTAT) 0.4 MG SL tablet Place 1 tablet (0.4 mg total) under the tongue every 5 (five) minutes as needed.  100 tablet  3  . QUEtiapine  (SEROQUEL) 50 MG tablet Take 25 mg by mouth at bedtime.       . Tamsulosin HCl (FLOMAX) 0.4 MG CAPS Take by mouth daily.        Marland Kitchen ZETIA 10 MG tablet TAKE 1 TABLET BY MOUTH EVERY DAY  90 tablet  2  . meclizine (ANTIVERT) 25 MG tablet Take 1 tablet (25 mg total) by mouth 3 (three) times daily as needed.  30 tablet  0    Allergies  Allergen Reactions  . Bactrim   . Crestor (Rosuvastatin Calcium)   . Gluten   . Sulfamethoxazole W-Trimethoprim     Patient Active Problem List  Diagnosis  . PARKINSON'S DISEASE  . MYOCARDIAL INFARCTION  . DERMATITIS HERPETIFORMIS  . SLEEP APNEA  . ATRIAL FIBRILLATION  . CARDIAC PACEMAKER IN SITU  . Hyperlipidemia  . CAD (coronary artery disease)  . Dementia  . Hematoma of chest wall  . Atrioventricular block, complete  . Dizziness    History  Smoking status  . Never Smoker   Smokeless tobacco  . Not on file    History  Alcohol Use No    Family History  Problem Relation  Age of Onset  . Heart attack Mother   . Heart disease Mother   . Hypertension Mother   . Heart attack Father   . Other Father     renal calculi    Review of Systems: Constitutional: no fever chills diaphoresis or fatigue or change in weight.  Head and neck: no hearing loss, no epistaxis, no photophobia or visual disturbance. Respiratory: No cough, shortness of breath or wheezing. Cardiovascular: No chest pain peripheral edema, palpitations. Gastrointestinal: No abdominal distention, no abdominal pain, no change in bowel habits hematochezia or melena. Genitourinary: No dysuria, no frequency, no urgency, no nocturia. Musculoskeletal:No arthralgias, no back pain, no gait disturbance or myalgias. Neurological: No dizziness, no headaches, no numbness, no seizures, no syncope, no weakness, no tremors. Hematologic: No lymphadenopathy, no easy bruising. Psychiatric: No confusion, no hallucinations, no sleep disturbance.    Physical Exam: Filed Vitals:   06/24/12 0952    BP: 136/88  Pulse: 80   the general appearance reveals a well-developed well-nourished gentleman in no distress.The head and neck exam reveals pupils equal and reactive.  Extraocular movements are full.  There is no scleral icterus.  The mouth and pharynx are normal.  The neck is supple.  The carotids reveal no bruits.  The jugular venous pressure is normal.  The  thyroid is not enlarged.  There is no lymphadenopathy.  The chest is clear to percussion and auscultation.  There are no rales or rhonchi.  Expansion of the chest is symmetrical.  The precordium is quiet.  The first heart sound is normal.  The second heart sound is physiologically split.  There is no murmur gallop rub or click.  There is no abnormal lift or heave.  The abdomen is soft and nontender.  The bowel sounds are normal.  The liver and spleen are not enlarged.  There are no abdominal masses.  There are no abdominal bruits.  Extremities reveal good pedal pulses.  There is no phlebitis or edema.  There is no cyanosis or clubbing.  Strength is normal and symmetrical in all extremities.  There is no lateralizing weakness.  There are no sensory deficits.  His Parkinson's tremor is not very evident today.  His speech is slurred from his Parkinson's.  The skin is warm and dry.  There is no rash.     Assessment / Plan: Patient is to continue same medication and we will add meclizine 25 mg as needed for dizziness.  Blood work today pending including CBC.  Recheck in 4 months for followup office visit and CBC and fasting lab work.  We will send a copy of his CBC to Dr. Danella Deis who follows him for his chronic dapsone.

## 2012-06-24 NOTE — Assessment & Plan Note (Signed)
The patient has not had any recurrent chest pain or angina. 

## 2012-06-24 NOTE — Progress Notes (Signed)
Quick Note:  Please report to patient. The recent labs are stable. Continue same medication and careful diet. Send a copy of the labs to Dr. Danella Deis ______

## 2012-06-24 NOTE — Assessment & Plan Note (Signed)
The patient has hyperlipidemia and coronary disease and is intolerant of statins.  He is able to take ezetimibe.  We are checking lab work today

## 2012-06-24 NOTE — Patient Instructions (Addendum)
Your physician has recommended you make the following change in your medication: START Meclizine 25mg  every 6 hours as needed for dizziness  Your physician recommends that you schedule a follow-up appointment in: 4 months with labs   Your physician recommends that you return for lab work [id number] and in 4 months  (cbc, lipid, liver, bmet)

## 2012-06-24 NOTE — Assessment & Plan Note (Signed)
The patient has continued to have problems with dizziness.  He has been measuring his home blood pressures twice a day and they have been satisfactory and not to low.  The patient states that his dizziness seems to be triggered if he turns over in bed.  This would suggest an element of vertigo.  We will give him a trial of meclizine 25 mg every 6 hours when necessary.

## 2012-06-25 ENCOUNTER — Telehealth: Payer: Self-pay | Admitting: *Deleted

## 2012-06-25 NOTE — Telephone Encounter (Signed)
Advised wife of labs and faxed to Dr Danella Deis

## 2012-06-25 NOTE — Telephone Encounter (Signed)
Message copied by Burnell Blanks on Fri Jun 25, 2012  8:19 AM ------      Message from: Cassell Clement      Created: Thu Jun 24, 2012  3:36 PM       Please report to patient.  The recent labs are stable. Continue same medication and careful diet.  Send a copy of the labs to Dr. Danella Deis

## 2012-07-05 ENCOUNTER — Telehealth: Payer: Self-pay | Admitting: Cardiology

## 2012-07-05 NOTE — Telephone Encounter (Signed)
Left message received, mail or come pick it up?

## 2012-07-05 NOTE — Telephone Encounter (Signed)
Pt needs to have a handicap application completed and it was mailed to you about a week ago and they where calling to see if we got it yet

## 2012-07-06 NOTE — Telephone Encounter (Signed)
Mailed to patient

## 2012-07-13 ENCOUNTER — Other Ambulatory Visit: Payer: Self-pay | Admitting: Cardiology

## 2012-07-23 ENCOUNTER — Telehealth: Payer: Self-pay | Admitting: Cardiology

## 2012-07-23 NOTE — Telephone Encounter (Signed)
plz return call to patient wife [phone] concerning BP readings

## 2012-07-23 NOTE — Telephone Encounter (Signed)
Patient fell one morning 3 times. Wife took him and nuero stimulator checked and everything ok.  She recommended checking blood pressure sitting and standing.  Blood pressure at start of checks 119/72 and would drop to 108/66 when standing.  Took on 9/10 sitting was 116/68 and standing 108/63, 9/11 sitting 103/63 and standing 93/60, 9/12 sitting 109/66 standing 92/59.  Advised patient wife to call his neurologist since low blood pressure readings may be coming from Parkinson's medications. Wife verbalized understanding

## 2012-08-16 ENCOUNTER — Telehealth: Payer: Self-pay | Admitting: Family Medicine

## 2012-08-16 NOTE — Telephone Encounter (Signed)
Called pt wife to advise that she needs to contact [facility] about the directions for supplements per currently MD Beverely Low does not have the labs in front of her and can not advise on numbers that she can not see per verbal from MD Tabori left vm on pt home number

## 2012-08-16 NOTE — Telephone Encounter (Signed)
MD Beverely Low made aware verbally that the tests run for pt falling a lot lately, received incoming fax, MD Tabori made notation to advise the following:  Start 1000units of D3 daily B12 500 or 1000 daily which ever is available OTC The B12 is within normal range but at low end of normal  Pt wife understood and will implement the supplements

## 2012-08-16 NOTE — Telephone Encounter (Signed)
Patients wife called stating the patient was seen at [facility] because he has been falling a lot due to his Parkinsons. She states his vitamin b12 level was 228 and vit d was 29.9. Patient has been told to start supplements, but is unsure of how much to take. Please advise.

## 2012-08-16 NOTE — Telephone Encounter (Signed)
Pt wife states pt has been falling a lot that's why labs were done.      MW

## 2012-08-16 NOTE — Telephone Encounter (Signed)
Pt wife returned call to advise the neurologist mailed a letter with the lab results and letter for advisement on pt supplements, and for pt to follow up with PCP in 6 months,and pt wife notes it is really hard to get in touch with someone at [facility] and wanted to know if she could fax the letter to MD Beverely Low for instructions on supplements, MD Beverely Low advised she could review and we would call her with the instructions, pt wife given fax number

## 2012-08-26 ENCOUNTER — Telehealth: Payer: Self-pay | Admitting: Cardiology

## 2012-08-27 ENCOUNTER — Encounter: Payer: Self-pay | Admitting: *Deleted

## 2012-08-27 NOTE — Telephone Encounter (Signed)
Advised wife letter done and will mail next week after  Dr. Patty Sermons signs

## 2012-08-27 NOTE — Telephone Encounter (Signed)
Follow-up:    Patient called in needing a detailed excuse form Jury Duty letter written for her husband.  Please call back to receive the specific details.

## 2012-10-18 ENCOUNTER — Other Ambulatory Visit (INDEPENDENT_AMBULATORY_CARE_PROVIDER_SITE_OTHER): Payer: Medicare Other

## 2012-10-18 ENCOUNTER — Encounter: Payer: Self-pay | Admitting: Cardiology

## 2012-10-18 ENCOUNTER — Ambulatory Visit (INDEPENDENT_AMBULATORY_CARE_PROVIDER_SITE_OTHER): Payer: Medicare Other | Admitting: Cardiology

## 2012-10-18 VITALS — BP 110/78 | HR 62 | Ht 71.0 in | Wt 170.2 lb

## 2012-10-18 DIAGNOSIS — I4891 Unspecified atrial fibrillation: Secondary | ICD-10-CM

## 2012-10-18 DIAGNOSIS — E7800 Pure hypercholesterolemia, unspecified: Secondary | ICD-10-CM

## 2012-10-18 DIAGNOSIS — E785 Hyperlipidemia, unspecified: Secondary | ICD-10-CM

## 2012-10-18 DIAGNOSIS — I259 Chronic ischemic heart disease, unspecified: Secondary | ICD-10-CM

## 2012-10-18 DIAGNOSIS — I2510 Atherosclerotic heart disease of native coronary artery without angina pectoris: Secondary | ICD-10-CM

## 2012-10-18 LAB — CBC WITH DIFFERENTIAL/PLATELET
Basophils Relative: 0.9 % (ref 0.0–3.0)
Eosinophils Absolute: 0.2 10*3/uL (ref 0.0–0.7)
Hemoglobin: 13.1 g/dL (ref 13.0–17.0)
MCHC: 32.2 g/dL (ref 30.0–36.0)
MCV: 88 fl (ref 78.0–100.0)
Monocytes Absolute: 0.5 10*3/uL (ref 0.1–1.0)
Neutro Abs: 1.6 10*3/uL (ref 1.4–7.7)
RBC: 4.61 Mil/uL (ref 4.22–5.81)

## 2012-10-18 LAB — LIPID PANEL
HDL: 46.5 mg/dL (ref >39.00)
LDL Cholesterol: 75 mg/dL (ref 0–99)
Total CHOL/HDL Ratio: 3
Triglycerides: 71 mg/dL (ref 0.0–149.0)

## 2012-10-18 LAB — BASIC METABOLIC PANEL
CO2: 32 mEq/L (ref 19–32)
Chloride: 101 mEq/L (ref 96–112)
Sodium: 138 mEq/L (ref 135–145)

## 2012-10-18 LAB — HEPATIC FUNCTION PANEL: Albumin: 3.7 g/dL (ref 3.5–5.2)

## 2012-10-18 NOTE — Patient Instructions (Addendum)
Will obtain labs today and call you with the results (lp/bmet/hfp/cbc)  Your physician recommends that you continue on your current medications as directed. Please refer to the Current Medication list given to you today.  Your physician wants you to follow-up in: 4 months with fasting labs (lp/bmet/hfp/cbc) You will receive a reminder letter in the mail two months in advance. If you don't receive a letter, please call our office to schedule the follow-up appointment.  

## 2012-10-18 NOTE — Assessment & Plan Note (Signed)
The patient has not been aware of any palpitations or tachycardia 

## 2012-10-18 NOTE — Progress Notes (Signed)
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 He has a past history of known coronary artery disease with a remote inferior wall myocardial infarction in 1986. He was treated with emergent angioplasty at that time. He had a repeat angioplasty in 2002. He had syncope in December 2011 and had long periods of asystole and has a dual-chamber pacemaker implanted since then he has gluten intolerance. 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 Danella Deis. He has severe dementia as well as severe Parkinson's and he does have a deep brain stimulator in place on the left side.  He has had some falling spells.  He is followed by neurology at [facility].   Current Outpatient Prescriptions  Medication Sig Dispense Refill  . amantadine (SYMMETREL) 100 MG capsule Take 100 mg by mouth 2 (two) times daily.        Marland Kitchen aspirin 81 MG tablet Take 81 mg by mouth daily.        . carbidopa-levodopa (SINEMET) 25-100 MG per tablet Take 1 tablet by mouth at bedtime.        . Carbidopa-Levodopa-Entacapone (STALEVO 200 PO) Take 200 mg by mouth 4 (four) times daily.        . dapsone 25 MG tablet Take 25 mg by mouth 2 (two) times daily.        Marland Kitchen donepezil (ARICEPT) 10 MG tablet Take 10 mg by mouth daily.        . Ergocalciferol (VITAMIN D2) 2000 UNITS TABS Take by mouth.      . escitalopram (LEXAPRO) 10 MG tablet Take 10 mg by mouth daily.        . nitroGLYCERIN (NITROSTAT) 0.4 MG SL tablet Place 1 tablet (0.4 mg total) under the tongue every 5 (five) minutes as needed.  100 tablet  3  . QUEtiapine (SEROQUEL) 50 MG tablet Take 25 mg by mouth at bedtime.       . Tamsulosin HCl (FLOMAX) 0.4 MG CAPS Take by mouth daily.        Marland Kitchen ZETIA 10 MG tablet TAKE 1 TABLET BY MOUTH  EVERY DAY  90 tablet  2  . meclizine (ANTIVERT) 25 MG tablet TAKE 1 TABLET BY MOUTH THREE TIMES DAILY AS NEEDED  30 tablet  0    Allergies  Allergen Reactions  . Bactrim   . Crestor (Rosuvastatin Calcium)   . Gluten   . Sulfamethoxazole W-Trimethoprim     Patient Active Problem List  Diagnosis  . PARKINSON'S DISEASE  . MYOCARDIAL INFARCTION  . DERMATITIS HERPETIFORMIS  . SLEEP APNEA  . ATRIAL FIBRILLATION  . CARDIAC PACEMAKER IN SITU  . Hyperlipidemia  . CAD (coronary artery disease)  . Dementia  . Hematoma of chest wall  . Atrioventricular block, complete  . Dizziness    History  Smoking status  . Never Smoker   Smokeless tobacco  . Not on file    History  Alcohol Use No    Family History  Problem Relation Age of Onset  . Heart attack Mother   . Heart disease Mother   . Hypertension Mother   . Heart attack Father   . Other Father     renal calculi    Review of Systems: Constitutional: no  fever chills diaphoresis or fatigue or change in weight.  Head and neck: no hearing loss, no epistaxis, no photophobia or visual disturbance. Respiratory: No cough, shortness of breath or wheezing. Cardiovascular: No chest pain peripheral edema, palpitations. Gastrointestinal: No abdominal distention, no abdominal pain, no change in bowel habits hematochezia or melena. Genitourinary: No dysuria, no frequency, no urgency, no nocturia. Musculoskeletal:No arthralgias, no back pain, no gait disturbance or myalgias. Neurological: No dizziness, no headaches, no numbness, no seizures, no syncope, no weakness, no tremors. Hematologic: No lymphadenopathy, no easy bruising. Psychiatric: No confusion, no hallucinations, no sleep disturbance.    Physical Exam: Filed Vitals:   10/18/12 0939  BP: 110/78  Pulse: 62   the general appearance reveals a well-developed well-nourished gentleman in no distress.  He has a gait disorder consistent with his Parkinson's.The head and neck  exam reveals pupils equal and reactive.  Extraocular movements are full.  There is no scleral icterus.  The mouth and pharynx are normal.  The neck is supple.  The carotids reveal no bruits.  The jugular venous pressure is normal.  The  thyroid is not enlarged.  There is no lymphadenopathy.  The chest is clear to percussion and auscultation.  There are no rales or rhonchi.  Expansion of the chest is symmetrical.  The precordium is quiet.  The first heart sound is normal.  The second heart sound is physiologically split.  There is no murmur gallop rub or click.  There is no abnormal lift or heave.  The abdomen is soft and nontender.  The bowel sounds are normal.  The liver and spleen are not enlarged.  There are no abdominal masses.  There are no abdominal bruits.  Extremities reveal good pedal pulses.  There is no phlebitis or edema.  There is no cyanosis or clubbing.  There is no significant resting tremor today. The skin is warm and dry.  There is no rash.     Assessment / Plan: The patient is to continue same medication.  He reports that his balance has improved since he was placed on vitamin D 2000 units a day.  He is also on vitamin B12. Recheck here in 4 months for followup office visit lipid panel hepatic function panel basal metabolic panel and CBC. We will send copies of her CBC to his dermatologist Dr. Danella Deis

## 2012-10-18 NOTE — Assessment & Plan Note (Signed)
The patient has a history of hypercholesterolemia and is unsteady.  He is intolerant of statins.  Blood work is pending.

## 2012-10-18 NOTE — Assessment & Plan Note (Signed)
The patient has not had to take any recent sublingual nitroglycerin.  No recent chest pain. 

## 2012-10-18 NOTE — Progress Notes (Signed)
Quick Note:  Please report to patient. The recent labs are stable. Continue same medication and careful diet. ______ 

## 2012-10-20 ENCOUNTER — Telehealth: Payer: Self-pay | Admitting: *Deleted

## 2012-10-20 NOTE — Telephone Encounter (Signed)
Message copied by Burnell Blanks on Wed Oct 20, 2012  8:24 AM ------      Message from: Cassell Clement      Created: Mon Oct 18, 2012  5:04 PM       Please report to patient.  The recent labs are stable. Continue same medication and careful diet.

## 2012-10-20 NOTE — Telephone Encounter (Signed)
Advised patient of lab results  

## 2012-11-09 ENCOUNTER — Encounter: Payer: Medicare Other | Admitting: Internal Medicine

## 2012-11-16 ENCOUNTER — Encounter: Payer: Medicare Other | Admitting: Internal Medicine

## 2012-11-22 ENCOUNTER — Other Ambulatory Visit: Payer: Self-pay | Admitting: *Deleted

## 2012-11-22 MED ORDER — EZETIMIBE 10 MG PO TABS: 10.0000 mg | ORAL_TABLET | Freq: Every day | ORAL | Status: DC

## 2012-12-14 ENCOUNTER — Encounter: Payer: Self-pay | Admitting: Internal Medicine

## 2012-12-14 ENCOUNTER — Ambulatory Visit (INDEPENDENT_AMBULATORY_CARE_PROVIDER_SITE_OTHER): Payer: Medicare Other | Admitting: Internal Medicine

## 2012-12-14 VITALS — BP 123/74 | HR 85 | Wt 177.6 lb

## 2012-12-14 DIAGNOSIS — I2510 Atherosclerotic heart disease of native coronary artery without angina pectoris: Secondary | ICD-10-CM

## 2012-12-14 DIAGNOSIS — I442 Atrioventricular block, complete: Secondary | ICD-10-CM

## 2012-12-14 DIAGNOSIS — Z950 Presence of cardiac pacemaker: Secondary | ICD-10-CM

## 2012-12-14 LAB — PACEMAKER DEVICE OBSERVATION
AL AMPLITUDE: 2 mv
BAMS-0001: 175 {beats}/min
BATTERY VOLTAGE: 2.77 V
RV LEAD IMPEDENCE PM: 513 Ohm
VENTRICULAR PACING PM: 99

## 2012-12-14 NOTE — Assessment & Plan Note (Signed)
He denies anginal symptoms. No change in medical therapy. 

## 2012-12-14 NOTE — Progress Notes (Signed)
HPI [name] returns today for followup. He is a very pleasant 72 year old man with a history of symptomatic bradycardia, status post permanent pacemaker insertion, history of Parkinson's disease, with a neural stimulator in place. In the interim, he has been stable. He does have a mild tremor which is fairly well controlled. Allergies  Allergen Reactions  . Bactrim   . Crestor (Rosuvastatin Calcium)   . Gluten   . Sulfamethoxazole W-Trimethoprim      Current Outpatient Prescriptions  Medication Sig Dispense Refill  . amantadine (SYMMETREL) 100 MG capsule Take 100 mg by mouth 2 (two) times daily.        Marland Kitchen aspirin 81 MG tablet Take 81 mg by mouth daily.        . carbidopa-levodopa (SINEMET) 25-100 MG per tablet Take 1 tablet by mouth at bedtime.        . Carbidopa-Levodopa-Entacapone (STALEVO 200 PO) Take 200 mg by mouth 4 (four) times daily.        . dapsone 25 MG tablet Take 25 mg by mouth 2 (two) times daily.        Marland Kitchen donepezil (ARICEPT) 10 MG tablet Take 10 mg by mouth daily.        . Ergocalciferol (VITAMIN D2) 2000 UNITS TABS Take by mouth.      . escitalopram (LEXAPRO) 10 MG tablet Take 10 mg by mouth daily.        Marland Kitchen ezetimibe (ZETIA) 10 MG tablet Take 1 tablet (10 mg total) by mouth daily.  90 tablet  3  . nitroGLYCERIN (NITROSTAT) 0.4 MG SL tablet Place 1 tablet (0.4 mg total) under the tongue every 5 (five) minutes as needed.  100 tablet  3  . QUEtiapine (SEROQUEL) 50 MG tablet Take 25 mg by mouth at bedtime.       . Tamsulosin HCl (FLOMAX) 0.4 MG CAPS Take by mouth daily.        . vitamin B-12 (CYANOCOBALAMIN) 1000 MCG tablet Take 1,000 mcg by mouth daily.         Past Medical History  Diagnosis Date  . Parkinson disease   . Bradycardia     syncope due to prolonged pauses, resolved with PPM  . Sleep apnea   . Hypertension   . Dermatitis   . Depression   . Rib fractures 2011    S/p fall  . Maxillary fracture 2009    Right  . BPH (benign prostatic hypertrophy)   .  Coronary artery disease 1986    Inferior wall MI treated with Angioplasty  . Normal echocardiogram 2007    Mild LVH with Impaired relaxation, Mild-Mod. Aortic Root dilation, Mild Aortic Sclerosis, Mild MR, Mild TR, Ef-50-55%  . Normal nuclear stress test 2006    Normal EF-54%  . CAD (coronary artery disease)   . Inferior MI   . 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 Maxillary fracture 2009    Right side  . Rib fracture 07/2010  . Atrioventricular block, complete     ROS:   All systems reviewed and negative except as noted in the HPI.   Past Surgical History  Procedure Date  . Pacemaker insertion 10/28/10    MDT implanted by Dr Ladona Ridgel  . Deep brain stimulator placement     Parkinsons  . Arthroscopic knee     Left knee  . Cataract surgery     Bilateral  . Inguinal hernia repair     Bilateral  . Cardiac catheterization  Multiple Angioplasties, last 2002  . Removal of ganglion cyst     right wrist     Family History  Problem Relation Age of Onset  . Heart attack Mother   . Heart disease Mother   . Hypertension Mother   . Heart attack Father   . Other Father     renal calculi     History   Social History  . Marital Status: Married    Spouse Name: N/A    Number of Children: N/A  . Years of Education: N/A   Occupational History  . Not on file.   Social History Main Topics  . Smoking status: Never Smoker   . Smokeless tobacco: Not on file  . Alcohol Use: No  . Drug Use: No  . Sexually Active: Not Currently   Other Topics Concern  . Not on file   Social History Narrative  . No narrative on file     BP 123/74  Pulse 85  Wt 177 lb 9.6 oz (80.559 kg)  Physical Exam:  Well appearing, blunted affect, NAD HEENT: Unremarkable Neck:  7 cm JVD, no thyromegally Lungs:  Clear with no wheezes, rales, or rhonchi. HEART:  Regular rate rhythm, no murmurs, no rubs, no clicks Abd:  soft, positive bowel sounds, no organomegally, no rebound, no  guarding Ext:  2 plus pulses, no edema, no cyanosis, no clubbing Skin:  No rashes no nodules Neuro:  CN II through XII intact, motor grossly intact  EKG sinus rhythm with atrial pacing  DEVICE  Normal device function.  See PaceArt for details. Today we spent 30 minutes interrogating the patient's device. It was difficult to confirm atrial pacing and capture. We were ultimately able to confirm satisfactory atrial and ventricular pacing thresholds. His device was reprogrammed to maximize battery longevity.  Assess/Plan:

## 2012-12-14 NOTE — Assessment & Plan Note (Signed)
His Medtronic dual-chamber pacemaker has been confirmed to be working normally. Atrial threshold measurements are very difficult but ultimately successfully confirmed. We'll recheck in several months.

## 2012-12-14 NOTE — Patient Instructions (Addendum)
Your physician wants you to follow-up in: 6 months in the device clinic and 12 months with Dr Taylor You will receive a reminder letter in the mail two months in advance. If you don't receive a letter, please call our office to schedule the follow-up appointment.  

## 2013-02-01 ENCOUNTER — Telehealth: Payer: Self-pay | Admitting: Family Medicine

## 2013-02-01 NOTE — Telephone Encounter (Signed)
We have not seen pt since Oct 2012.  Cannot enter labs at this time.  Will need to go to [facility] for lab draw, cardiology, or have OV

## 2013-02-01 NOTE — Telephone Encounter (Signed)
Please advise on note regarding having labs drawn.//AB/CMA

## 2013-02-01 NOTE — Telephone Encounter (Signed)
Patient's spouse, Consuella Lose, calling, states it is time for patient's 6 month recheck of his Vitamin D3 and B12, per letter faxed to Dr. Beverely Low from [facility].  Will you enter orders?

## 2013-02-02 NOTE — Telephone Encounter (Signed)
Patient is aware and scheduled an apt for tomorrow.      KP

## 2013-02-03 ENCOUNTER — Encounter: Payer: Self-pay | Admitting: Family Medicine

## 2013-02-03 ENCOUNTER — Ambulatory Visit (INDEPENDENT_AMBULATORY_CARE_PROVIDER_SITE_OTHER): Payer: Medicare Other | Admitting: Family Medicine

## 2013-02-03 VITALS — BP 110/70 | HR 60 | Temp 97.4°F | Ht 68.0 in | Wt 177.4 lb

## 2013-02-03 DIAGNOSIS — E538 Deficiency of other specified B group vitamins: Secondary | ICD-10-CM

## 2013-02-03 DIAGNOSIS — E559 Vitamin D deficiency, unspecified: Secondary | ICD-10-CM

## 2013-02-03 DIAGNOSIS — G20 Parkinson's disease: Secondary | ICD-10-CM

## 2013-02-03 NOTE — Assessment & Plan Note (Signed)
Chronic problem.  Pt's disease is progressing despite max medication and DBS management.  Will follow along and assist as able.

## 2013-02-03 NOTE — Assessment & Plan Note (Signed)
New.  Noted by Neuro.  On daily supplementation.  Check labs.  Adjust dose prn.

## 2013-02-03 NOTE — Assessment & Plan Note (Signed)
New.  Noted by Neuro.  Check labs.  Fax copy to [facility]

## 2013-02-03 NOTE — Patient Instructions (Addendum)
Follow up at least once yearly so I can see how you're doing We'll notify you of your lab results and fax a copy to [facility] Call with any questions or concerns [place]!!!

## 2013-02-03 NOTE — Progress Notes (Signed)
  Subjective:    Patient ID: [name], male    DOB: 12-13-1940, 72 y.o.   MRN: [id number]  HPI Parkinson's- pt is seeing Dr Rubin Payor at [facility].  Was told by Neuro that low Vit D and B12 can cause this.  Is now on daily supplements.  Was told to have this rechecked in 6 months.  Due for labs today.  Walking is considerably worse, w/ frequent falls.  Pt has DBS.  On Stalevo during the day, Sinemet CR at night.  On Amantadine.  Pt denies current pain.   Review of Systems For ROS see HPI     Objective:   Physical Exam  Vitals reviewed. Constitutional: He is oriented to person, place, and time. He appears well-developed and well-nourished. No distress.  Slumped in chair  Cardiovascular:  Distant S1/S2  Pulmonary/Chest: Effort normal and breath sounds normal. No respiratory distress. He has no wheezes. He has no rales.  Neurological: He is alert and oriented to person, place, and time. Coordination abnormal.  Tremor of L hand Abnormal gait due to Parkinson's- walks w/ walker          Assessment & Plan:

## 2013-02-04 LAB — VITAMIN B12: Vitamin B-12: 573 pg/mL (ref 211–911)

## 2013-02-07 ENCOUNTER — Encounter: Payer: Self-pay | Admitting: *Deleted

## 2013-02-07 LAB — VITAMIN D 1,25 DIHYDROXY
Vitamin D2 1, 25 (OH)2: <8 pg/mL
Vitamin D3 1, 25 (OH)2: 46 pg/mL

## 2013-02-23 ENCOUNTER — Ambulatory Visit (INDEPENDENT_AMBULATORY_CARE_PROVIDER_SITE_OTHER): Payer: Medicare Other | Admitting: Cardiology

## 2013-02-23 ENCOUNTER — Other Ambulatory Visit (INDEPENDENT_AMBULATORY_CARE_PROVIDER_SITE_OTHER): Payer: Medicare Other

## 2013-02-23 ENCOUNTER — Encounter: Payer: Self-pay | Admitting: Cardiology

## 2013-02-23 VITALS — BP 118/72 | HR 96 | Ht 70.0 in | Wt 173.6 lb

## 2013-02-23 DIAGNOSIS — I2510 Atherosclerotic heart disease of native coronary artery without angina pectoris: Secondary | ICD-10-CM

## 2013-02-23 DIAGNOSIS — E7800 Pure hypercholesterolemia, unspecified: Secondary | ICD-10-CM

## 2013-02-23 DIAGNOSIS — Z79899 Other long term (current) drug therapy: Secondary | ICD-10-CM

## 2013-02-23 DIAGNOSIS — I442 Atrioventricular block, complete: Secondary | ICD-10-CM

## 2013-02-23 DIAGNOSIS — L130 Dermatitis herpetiformis: Secondary | ICD-10-CM

## 2013-02-23 DIAGNOSIS — I259 Chronic ischemic heart disease, unspecified: Secondary | ICD-10-CM

## 2013-02-23 LAB — BASIC METABOLIC PANEL
BUN: 16 mg/dL (ref 6–23)
Calcium: 9.1 mg/dL (ref 8.4–10.5)
Creatinine, Ser: 0.8 mg/dL (ref 0.4–1.5)
GFR: 97.56 mL/min (ref >60.00)
Glucose, Bld: 87 mg/dL (ref 70–99)
Sodium: 137 mEq/L (ref 135–145)

## 2013-02-23 LAB — CBC WITH DIFFERENTIAL/PLATELET
Basophils Absolute: 0 10*3/uL (ref 0.0–0.1)
Eosinophils Absolute: 0.2 10*3/uL (ref 0.0–0.7)
Lymphocytes Relative: 37.9 % (ref 12.0–46.0)
MCHC: 32.6 g/dL (ref 30.0–36.0)
Monocytes Relative: 12.8 % — ABNORMAL HIGH (ref 3.0–12.0)
Platelets: 268 10*3/uL (ref 150.0–400.0)
RDW: 15.4 % — ABNORMAL HIGH (ref 11.5–14.6)

## 2013-02-23 LAB — HEPATIC FUNCTION PANEL
ALT: 6 U/L (ref 0–53)
AST: 20 U/L (ref 0–37)
Alkaline Phosphatase: 73 U/L (ref 39–117)
Bilirubin, Direct: 0.1 mg/dL (ref 0.0–0.3)
Total Bilirubin: 0.8 mg/dL (ref 0.3–1.2)

## 2013-02-23 LAB — LIPID PANEL
Total CHOL/HDL Ratio: 3
Triglycerides: 61 mg/dL (ref 0.0–149.0)

## 2013-02-23 NOTE — Progress Notes (Signed)
[name] Date of Birth:  15-Nov-1940 [facility] [address] [place], [place]  [address] [phone]         Fax   [phone]  History of Present Illness: This pleasant 71 year old retired [name] is seen for a scheduled four-month followup office visit. He has a past history of known coronary artery disease with a remote inferior wall myocardial infarction in 1986. He was treated with emergent angioplasty at that time. He had a repeat angioplasty in 2002. He had syncope in December 2011 and had long periods of asystole and has a dual-chamber pacemaker implanted since then.  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 He has severe dementia as well as severe Parkinson's and he does have a deep brain stimulator in place on the left side. He has had some falling spells. He is followed by neurology at [facility].   Current Outpatient Prescriptions  Medication Sig Dispense Refill  . amantadine (SYMMETREL) 100 MG capsule Take 100 mg by mouth 2 (two) times daily.        Marland Kitchen aspirin 81 MG tablet Take 81 mg by mouth daily.        . carbidopa-levodopa (SINEMET) 25-100 MG per tablet Take 1 tablet by mouth at bedtime.        . Carbidopa-Levodopa-Entacapone (STALEVO 200 PO) Take 200 mg by mouth 4 (four) times daily.        . Cholecalciferol (VITAMIN D3) 2000 UNITS TABS Take by mouth.      . dapsone 25 MG tablet Take 25 mg by mouth 2 (two) times daily.        Marland Kitchen donepezil (ARICEPT) 10 MG tablet Take 10 mg by mouth daily.        Marland Kitchen escitalopram (LEXAPRO) 10 MG tablet Take 10 mg by mouth daily.        Marland Kitchen ezetimibe (ZETIA) 10 MG tablet Take 1 tablet (10 mg total) by mouth daily.  90 tablet  3  . magnesium oxide (MAG-OX) 400 MG tablet Take 400 mg by mouth every other day.      . nitroGLYCERIN (NITROSTAT) 0.4 MG SL tablet Place 1 tablet (0.4 mg total) under the tongue every 5 (five) minutes as needed.  100 tablet  3    . QUEtiapine (SEROQUEL) 25 MG tablet Take 25 mg by mouth at bedtime.      . Tamsulosin HCl (FLOMAX) 0.4 MG CAPS Take by mouth daily.        . vitamin B-12 (CYANOCOBALAMIN) 1000 MCG tablet Take 1,000 mcg by mouth daily.       No current facility-administered medications for this visit.    Allergies  Allergen Reactions  . Bactrim   . Crestor (Rosuvastatin Calcium)   . Gluten   . Sulfamethoxazole W-Trimethoprim     Patient Active Problem List  Diagnosis  . PARKINSON'S DISEASE  . MYOCARDIAL INFARCTION  . DERMATITIS HERPETIFORMIS  . SLEEP APNEA  . ATRIAL FIBRILLATION  . CARDIAC PACEMAKER IN SITU  . Hyperlipidemia  . CAD (coronary artery disease)  . Dementia  . Hematoma of chest wall  . Atrioventricular block, complete  . Dizziness  . Unspecified vitamin D deficiency  . B12 deficiency    History  Smoking status  . Never Smoker   Smokeless tobacco  . Not on file    History  Alcohol Use No    Family History  Problem Relation Age of Onset  . Heart  attack Mother   . Heart disease Mother   . Hypertension Mother   . Heart attack Father   . Other Father     renal calculi    Review of Systems: Constitutional: no fever chills diaphoresis or fatigue or change in weight.  Head and neck: no hearing loss, no epistaxis, no photophobia or visual disturbance. Respiratory: No cough, shortness of breath or wheezing. Cardiovascular: No chest pain peripheral edema, palpitations. Gastrointestinal: No abdominal distention, no abdominal pain, no change in bowel habits hematochezia or melena. Genitourinary: No dysuria, no frequency, no urgency, no nocturia. Musculoskeletal:No arthralgias, no back pain, no gait disturbance or myalgias. Neurological: No dizziness, no headaches, no numbness, no seizures, no syncope, no weakness, no tremors. Hematologic: No lymphadenopathy, no easy bruising. Psychiatric: No confusion, no hallucinations, no sleep disturbance.    Physical  Exam: Filed Vitals:   02/23/13 0922  BP: 118/72  Pulse: 96   the general appearance reveals a well-developed well-nourished gentleman who has a significant movement disorder from his Parkinson's disease.The head and neck exam reveals pupils equal and reactive.  Extraocular movements are full.  There is no scleral icterus.  The mouth and pharynx are normal.  The neck is supple.  The carotids reveal no bruits.  The jugular venous pressure is normal.  The  thyroid is not enlarged.  There is no lymphadenopathy.  The chest is clear to percussion and auscultation.  There are no rales or rhonchi.  Expansion of the chest is symmetrical.  Pacemaker noted in left upper chest and no evidence of problems with the pacemaker site.  The precordium is quiet.  The first heart sound is normal.  The second heart sound is physiologically split.  There is no murmur gallop rub or click.  There is no abnormal lift or heave.  The abdomen is soft and nontender.  The bowel sounds are normal.  The liver and spleen are not enlarged.  There are no abdominal masses.  There are no abdominal bruits.  Extremities reveal good pedal pulses.  There is no phlebitis or edema.  There is no cyanosis or clubbing.  Strength is normal and symmetrical in all extremities.  There is no lateralizing weakness.  There are no sensory deficits.  The skin is warm and dry.  There is mild rash.     Assessment / Plan: Continue on same medication.  Blood work today is pending.  Recheck in 4 months for followup office visit CBC lipid panel hepatic function panel and basal metabolic panel.

## 2013-02-23 NOTE — Progress Notes (Signed)
Quick Note:  Please report to patient. The recent labs are stable. Continue same medication and careful diet. Send copy to Dr. Everlene Farrier. ______

## 2013-02-23 NOTE — Assessment & Plan Note (Signed)
The patient is relatively sedentary because of his Parkinson's disease.  He has not been expressing any chest pain to suggest recurrent angina

## 2013-02-23 NOTE — Assessment & Plan Note (Signed)
The patient has a functioning dual-chamber pacemaker.  He has no further dizzy spells or syncope since pacemaker implantation for prolonged sinus pauses.

## 2013-02-23 NOTE — Assessment & Plan Note (Signed)
His chronic dermatitis appears to be stable and he is avoiding foods that contain gluten.

## 2013-02-23 NOTE — Patient Instructions (Addendum)
Will obtain labs today and call you with the results (lp/bmet/hfp/cbc)  Your physician recommends that you continue on your current medications as directed. Please refer to the Current Medication list given to you today.  Your physician recommends that you schedule a follow-up appointment in: 4 months with fasting labs (lp/bmet/hfp/cbc)  

## 2013-02-24 ENCOUNTER — Telehealth: Payer: Self-pay | Admitting: *Deleted

## 2013-02-24 NOTE — Telephone Encounter (Signed)
Advised wife of labs 

## 2013-02-24 NOTE — Telephone Encounter (Signed)
Message copied by Burnell Blanks on Thu Feb 24, 2013  5:08 PM ------      Message from: Cassell Clement      Created: Wed Feb 23, 2013  9:19 PM       Please report to patient.  The recent labs are stable. Continue same medication and careful diet. Send copy to Dr. Everlene Farrier. ------

## 2013-06-29 ENCOUNTER — Telehealth: Payer: Self-pay | Admitting: *Deleted

## 2013-06-29 ENCOUNTER — Encounter: Payer: Self-pay | Admitting: Cardiology

## 2013-06-29 ENCOUNTER — Ambulatory Visit (INDEPENDENT_AMBULATORY_CARE_PROVIDER_SITE_OTHER): Payer: Medicare Other | Admitting: Cardiology

## 2013-06-29 ENCOUNTER — Other Ambulatory Visit (INDEPENDENT_AMBULATORY_CARE_PROVIDER_SITE_OTHER): Payer: Medicare Other

## 2013-06-29 VITALS — BP 112/60 | HR 60 | Ht 71.0 in | Wt 166.0 lb

## 2013-06-29 DIAGNOSIS — I2510 Atherosclerotic heart disease of native coronary artery without angina pectoris: Secondary | ICD-10-CM

## 2013-06-29 DIAGNOSIS — G20A1 Parkinson's disease without dyskinesia, without mention of fluctuations: Secondary | ICD-10-CM

## 2013-06-29 DIAGNOSIS — E7800 Pure hypercholesterolemia, unspecified: Secondary | ICD-10-CM

## 2013-06-29 DIAGNOSIS — G20 Parkinson's disease: Secondary | ICD-10-CM

## 2013-06-29 DIAGNOSIS — Z79899 Other long term (current) drug therapy: Secondary | ICD-10-CM

## 2013-06-29 DIAGNOSIS — I259 Chronic ischemic heart disease, unspecified: Secondary | ICD-10-CM

## 2013-06-29 DIAGNOSIS — I442 Atrioventricular block, complete: Secondary | ICD-10-CM

## 2013-06-29 LAB — CBC WITH DIFFERENTIAL/PLATELET
Basophils Absolute: 0 10*3/uL (ref 0.0–0.1)
Eosinophils Relative: 4 % (ref 0.0–5.0)
HCT: 41.1 % (ref 39.0–52.0)
Lymphocytes Relative: 32 % (ref 12.0–46.0)
Lymphs Abs: 1.5 10*3/uL (ref 0.7–4.0)
Monocytes Relative: 11.5 % (ref 3.0–12.0)
Platelets: 263 10*3/uL (ref 150.0–400.0)
RDW: 15.2 % — ABNORMAL HIGH (ref 11.5–14.6)
WBC: 4.6 10*3/uL (ref 4.5–10.5)

## 2013-06-29 LAB — BASIC METABOLIC PANEL
BUN: 17 mg/dL (ref 6–23)
Calcium: 9.1 mg/dL (ref 8.4–10.5)
GFR: 93.51 mL/min (ref >60.00)
Glucose, Bld: 87 mg/dL (ref 70–99)
Potassium: 3.9 mEq/L (ref 3.5–5.1)
Sodium: 137 mEq/L (ref 135–145)

## 2013-06-29 LAB — HEPATIC FUNCTION PANEL
ALT: 10 U/L (ref 0–53)
AST: 20 U/L (ref 0–37)
Bilirubin, Direct: 0.1 mg/dL (ref 0.0–0.3)
Total Protein: 6.7 g/dL (ref 6.0–8.3)

## 2013-06-29 LAB — LIPID PANEL
Cholesterol: 126 mg/dL (ref 0–200)
HDL: 42.3 mg/dL (ref >39.00)
VLDL: 15 mg/dL (ref 0.0–40.0)

## 2013-06-29 MED ORDER — NITROGLYCERIN 0.4 MG SL SUBL: 0.4000 mg | SUBLINGUAL_TABLET | SUBLINGUAL | Status: DC | PRN

## 2013-06-29 NOTE — Progress Notes (Signed)
Quick Note:  Please report to patient. The recent labs are stable. Continue same medication and careful diet. Send copies of blood work to his dermatologist. ______

## 2013-06-29 NOTE — Assessment & Plan Note (Signed)
The patient has a deep brain stimulator.  His wife states that she thinks that the generator battery will need to be in changed on it  Soon.

## 2013-06-29 NOTE — Progress Notes (Signed)
[name] Date of Birth:  08/02/1941 [facility] [address] [place], [place]  [address] [phone]         Fax   [phone]  History of Present Illness: This pleasant 72 year old retired [name] fund[employer] is seen for a scheduled four-month followup office visit. He has a past history of known coronary artery disease with a remote inferior wall myocardial infarction in 1986. He was treated with emergent angioplasty at that time. He had a repeat angioplasty in 2002. He had syncope in December 2011 and had long periods of asystole and has a dual-chamber pacemaker implanted since then. 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 He has severe dementia as well as severe Parkinson's and he does have a deep brain stimulator in place on the left side. He has had some falling spells. He is followed by neurology at [facility].  He is now seeing a chiropractor who has helped him with his gait and he has not fallen in the past several weeks.   Current Outpatient Prescriptions  Medication Sig Dispense Refill  . amantadine (SYMMETREL) 100 MG capsule Take 100 mg by mouth 2 (two) times daily.        Marland Kitchen aspirin 81 MG tablet Take 81 mg by mouth daily.        . carbidopa-levodopa (SINEMET) 25-100 MG per tablet Take 1 tablet by mouth at bedtime.        . Carbidopa-Levodopa-Entacapone (STALEVO 200 PO) Take 200 mg by mouth 4 (four) times daily.        . Cholecalciferol (VITAMIN D3) 2000 UNITS TABS Take by mouth.      . dapsone 25 MG tablet Take 25 mg by mouth 2 (two) times daily.        Marland Kitchen docusate sodium (COLACE) 50 MG capsule Take by mouth 3 (three) times daily as needed for constipation.      Marland Kitchen donepezil (ARICEPT) 10 MG tablet Take 10 mg by mouth daily.        Marland Kitchen escitalopram (LEXAPRO) 10 MG tablet Take 10 mg by mouth daily.        Marland Kitchen ezetimibe (ZETIA) 10 MG tablet Take 1 tablet (10 mg total) by mouth daily.  90 tablet  3  .  nitroGLYCERIN (NITROSTAT) 0.4 MG SL tablet Place 1 tablet (0.4 mg total) under the tongue every 5 (five) minutes as needed.  100 tablet  3  . QUEtiapine (SEROQUEL) 25 MG tablet Take 25 mg by mouth at bedtime.      . Tamsulosin HCl (FLOMAX) 0.4 MG CAPS Take by mouth daily.        . vitamin B-12 (CYANOCOBALAMIN) 1000 MCG tablet Take 1,000 mcg by mouth daily.      . magnesium oxide (MAG-OX) 400 MG tablet Take 400 mg by mouth every other day.       No current facility-administered medications for this visit.    Allergies  Allergen Reactions  . Bactrim   . Crestor [Rosuvastatin Calcium]   . Gluten   . Sulfamethoxazole W-Trimethoprim     Patient Active Problem List   Diagnosis Date Noted  . Unspecified vitamin D deficiency 02/03/2013  . B12 deficiency 02/03/2013  . Dizziness 06/10/2012  . Atrioventricular block, complete   . Hematoma of chest wall 08/26/2011  . Hyperlipidemia 08/22/2011  . CAD (coronary artery disease) 08/22/2011  . Dementia 08/22/2011  . ATRIAL FIBRILLATION 01/21/2011  . CARDIAC PACEMAKER IN SITU 01/21/2011  .  PARKINSON'S DISEASE 11/18/2007  . MYOCARDIAL INFARCTION 11/18/2007  . 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 SLEEP APNEA 11/18/2007    History  Smoking status  . Never Smoker   Smokeless tobacco  . Not on file    History  Alcohol Use No    Family History  Problem Relation Age of Onset  . Heart attack Mother   . Heart disease Mother   . Hypertension Mother   . Heart attack Father   . Other Father     renal calculi    Review of Systems: Constitutional: no fever chills diaphoresis or fatigue or change in weight.  Head and neck: no hearing loss, no epistaxis, no photophobia or visual disturbance. Respiratory: No cough, shortness of breath or wheezing. Cardiovascular: No chest pain peripheral edema, palpitations. Gastrointestinal: No abdominal distention, no abdominal pain, no change in bowel habits hematochezia or melena. Genitourinary: No  dysuria, no frequency, no urgency, no nocturia. Musculoskeletal:No arthralgias, no back pain, no gait disturbance or myalgias. Neurological: No dizziness, no headaches, no numbness, no seizures, no syncope, no weakness, no tremors. Hematologic: No lymphadenopathy, no easy bruising. Psychiatric: No confusion, no hallucinations, no sleep disturbance.    Physical Exam: Filed Vitals:   06/29/13 1031  BP: 112/60  Pulse: 60   the general appearance reveals a well-developed gentleman in no distress.  He has lost 7 pounds since last visit.  He has a deep brain stimulator implanted in the left anterior chest and his cardiac pacemaker in the right anterior chest..The head and neck exam reveals pupils equal and reactive.  Extraocular movements are full.  There is no scleral icterus.  The mouth and pharynx are normal.  The neck is supple.  The carotids reveal no bruits.  The jugular venous pressure is normal.  The  thyroid is not enlarged.  There is no lymphadenopathy.  The chest is clear to percussion and auscultation.  There are no rales or rhonchi.  Expansion of the chest is symmetrical.  The precordium is quiet.  The first heart sound is normal.  The second heart sound is physiologically split.  There is no murmur gallop rub or click.  There is no abnormal lift or heave.  The abdomen is soft and nontender.  The bowel sounds are normal.  The liver and spleen are not enlarged.  There are no abdominal masses.  There are no abdominal bruits.  Extremities reveal good pedal pulses.  There is no phlebitis or edema.  There is no cyanosis or clubbing.  Strength is normal and symmetrical in all extremities.  There is no lateralizing weakness.  There are no sensory deficits.  The skin is warm and dry.  There is no rash.  EKG shows AV paced rhythm   Assessment / Plan:  continue same medication.  Lab work today pending.  He goes to the gym twice a week for exercise class for Parkinson's patients.  He remains on a  partially gluten free diet but cheats at times. Recheck in 4 months for followup office visit CBC lipid panel hepatic function panel and basal metabolic panel.  We refilled his outdated sublingual nitroglycerin today.

## 2013-06-29 NOTE — Patient Instructions (Addendum)
Will obtain labs today and call you with the results (lp/bmet/hfp/cbc)  Your physician recommends that you continue on your current medications as directed. Please refer to the Current Medication list given to you today.  Your physician recommends that you schedule a follow-up appointment in: 4 months with fasting labs (lp/bmet/hfp/cbc)  

## 2013-06-29 NOTE — Assessment & Plan Note (Signed)
The patient has not had any recurrent chest pain or angina. 

## 2013-06-29 NOTE — Telephone Encounter (Signed)
Message copied by Burnell Blanks on Wed Jun 29, 2013  4:56 PM ------      Message from: Cassell Clement      Created: Wed Jun 29, 2013  4:11 PM       Please report to patient.  The recent labs are stable. Continue same medication and careful diet.  Send copies of blood work to his dermatologist. ------

## 2013-06-29 NOTE — Assessment & Plan Note (Signed)
The patient has had no severe dizzy spells or syncope.  The EKG today shows AV paced rhythm at 60 per minute

## 2013-06-29 NOTE — Telephone Encounter (Signed)
Advised wife of labs and sent to Dr Danella Deis

## 2013-07-06 ENCOUNTER — Ambulatory Visit (INDEPENDENT_AMBULATORY_CARE_PROVIDER_SITE_OTHER): Payer: Medicare Other | Admitting: *Deleted

## 2013-07-06 DIAGNOSIS — I442 Atrioventricular block, complete: Secondary | ICD-10-CM

## 2013-07-06 LAB — PACEMAKER DEVICE OBSERVATION
AL THRESHOLD: 1.25 V
ATRIAL PACING PM: 97
BAMS-0001: 175 {beats}/min
DEVICE MODEL PM: 248168
RV LEAD THRESHOLD: 0.75 V

## 2013-07-06 NOTE — Progress Notes (Signed)
PPM check in office. 

## 2013-08-10 ENCOUNTER — Encounter: Payer: Self-pay | Admitting: Internal Medicine

## 2013-08-23 DIAGNOSIS — Z950 Presence of cardiac pacemaker: Secondary | ICD-10-CM | POA: Insufficient documentation

## 2013-08-23 DIAGNOSIS — Z9861 Coronary angioplasty status: Secondary | ICD-10-CM | POA: Insufficient documentation

## 2013-08-23 DIAGNOSIS — G4733 Obstructive sleep apnea (adult) (pediatric): Secondary | ICD-10-CM | POA: Insufficient documentation

## 2013-08-23 DIAGNOSIS — I252 Old myocardial infarction: Secondary | ICD-10-CM | POA: Insufficient documentation

## 2013-10-24 ENCOUNTER — Ambulatory Visit (INDEPENDENT_AMBULATORY_CARE_PROVIDER_SITE_OTHER): Payer: Medicare Other | Admitting: Cardiology

## 2013-10-24 ENCOUNTER — Encounter: Payer: Self-pay | Admitting: Cardiology

## 2013-10-24 ENCOUNTER — Other Ambulatory Visit: Payer: Medicare Other

## 2013-10-24 VITALS — BP 114/73 | HR 65 | Ht 71.0 in | Wt 164.0 lb

## 2013-10-24 DIAGNOSIS — I442 Atrioventricular block, complete: Secondary | ICD-10-CM

## 2013-10-24 DIAGNOSIS — E785 Hyperlipidemia, unspecified: Secondary | ICD-10-CM

## 2013-10-24 DIAGNOSIS — F0390 Unspecified dementia without behavioral disturbance: Secondary | ICD-10-CM

## 2013-10-24 DIAGNOSIS — I2510 Atherosclerotic heart disease of native coronary artery without angina pectoris: Secondary | ICD-10-CM

## 2013-10-24 DIAGNOSIS — E7800 Pure hypercholesterolemia, unspecified: Secondary | ICD-10-CM

## 2013-10-24 DIAGNOSIS — I119 Hypertensive heart disease without heart failure: Secondary | ICD-10-CM

## 2013-10-24 DIAGNOSIS — I259 Chronic ischemic heart disease, unspecified: Secondary | ICD-10-CM

## 2013-10-24 DIAGNOSIS — K900 Celiac disease: Secondary | ICD-10-CM

## 2013-10-24 LAB — BASIC METABOLIC PANEL
BUN: 12 mg/dL (ref 6–23)
Creatinine, Ser: 0.7 mg/dL (ref 0.4–1.5)
GFR: 109.63 mL/min (ref >60.00)
Potassium: 4.1 mEq/L (ref 3.5–5.1)
Sodium: 138 mEq/L (ref 135–145)

## 2013-10-24 LAB — CBC WITH DIFFERENTIAL/PLATELET
Basophils Absolute: 0 10*3/uL (ref 0.0–0.1)
Eosinophils Relative: 4.1 % (ref 0.0–5.0)
Lymphocytes Relative: 35.3 % (ref 12.0–46.0)
Lymphs Abs: 1.5 10*3/uL (ref 0.7–4.0)
MCV: 87 fl (ref 78.0–100.0)
Monocytes Relative: 12.7 % — ABNORMAL HIGH (ref 3.0–12.0)
Neutrophils Relative %: 47.3 % (ref 43.0–77.0)
Platelets: 281 10*3/uL (ref 150.0–400.0)
RDW: 15.9 % — ABNORMAL HIGH (ref 11.5–14.6)
WBC: 4.3 10*3/uL — ABNORMAL LOW (ref 4.5–10.5)

## 2013-10-24 LAB — LIPID PANEL
Cholesterol: 148 mg/dL (ref 0–200)
HDL: 47.4 mg/dL (ref >39.00)
Triglycerides: 80 mg/dL (ref 0.0–149.0)
VLDL: 16 mg/dL (ref 0.0–40.0)

## 2013-10-24 LAB — HEPATIC FUNCTION PANEL
ALT: 12 U/L (ref 0–53)
Albumin: 4 g/dL (ref 3.5–5.2)
Bilirubin, Direct: 0 mg/dL (ref 0.0–0.3)
Total Protein: 6.5 g/dL (ref 6.0–8.3)

## 2013-10-24 NOTE — Progress Notes (Signed)
Quick Note:  Please report to patient. The recent labs are stable. Continue same medication and careful diet. ______ 

## 2013-10-24 NOTE — Patient Instructions (Signed)
Lab work today    Your physician recommends that you schedule a follow-up appointment in: 4 months with fasting lab

## 2013-10-24 NOTE — Progress Notes (Signed)
[name] Date of Birth:  July 23, 1941 [address] [place], [place]  [address] [phone]         Fax   [phone]  History of Present Illness: This pleasant 72 year old retired [name] fund[employer] is seen for a scheduled four-month followup office visit. He has a past history of known coronary artery disease with a remote inferior wall myocardial infarction in 1986. He was treated with emergent angioplasty at that time. He had a repeat angioplasty in 2002. He had syncope in December 2011 and had long periods of asystole and has a dual-chamber pacemaker implanted since then. 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 He has severe dementia as well as severe Parkinson's and he does have a deep brain stimulator in place on the left side. He has had some falling spells. He is followed by neurology at [facility].  Since last visit he has had his generator battery change October 15 for his deep stimulator.  He has been told that he may need a repositioning of the lead to the right side of his brain.  Current Outpatient Prescriptions  Medication Sig Dispense Refill  . aspirin 81 MG tablet Take 81 mg by mouth daily.        . carbidopa-levodopa (SINEMET) 25-100 MG per tablet Take 1 tablet by mouth at bedtime.        . Carbidopa-Levodopa-Entacapone (STALEVO 200 PO) Take 200 mg by mouth 4 (four) times daily.        . Cholecalciferol (VITAMIN D3) 2000 UNITS TABS Take by mouth.      . dapsone 25 MG tablet Take 25 mg by mouth 2 (two) times daily.        Marland Kitchen docusate sodium (COLACE) 50 MG capsule Take by mouth 3 (three) times daily as needed for constipation.      Marland Kitchen donepezil (ARICEPT) 10 MG tablet Take 10 mg by mouth daily.        Marland Kitchen escitalopram (LEXAPRO) 10 MG tablet Take 10 mg by mouth daily.        Marland Kitchen ezetimibe (ZETIA) 10 MG tablet Take 1 tablet (10 mg total) by mouth daily.  90 tablet  3  . nitroGLYCERIN (NITROSTAT) 0.4 MG SL  tablet Place 1 tablet (0.4 mg total) under the tongue every 5 (five) minutes as needed.  100 tablet  3  . QUEtiapine (SEROQUEL) 25 MG tablet Take 25 mg by mouth at bedtime.      . solifenacin (VESICARE) 5 MG tablet Take 5 mg by mouth daily.      . Tamsulosin HCl (FLOMAX) 0.4 MG CAPS Take by mouth daily.        . vitamin B-12 (CYANOCOBALAMIN) 1000 MCG tablet Take 1,000 mcg by mouth daily.       No current facility-administered medications for this visit.    Allergies  Allergen Reactions  . Bactrim   . Crestor [Rosuvastatin Calcium]   . Gluten   . Sulfamethoxazole-Trimethoprim     Patient Active Problem List   Diagnosis Date Noted  . Unspecified vitamin D deficiency 02/03/2013  . B12 deficiency 02/03/2013  . Dizziness 06/10/2012  . Atrioventricular block, complete   . Hematoma of chest wall 08/26/2011  . Hyperlipidemia 08/22/2011  . CAD (coronary artery disease) 08/22/2011  . Dementia 08/22/2011  . ATRIAL FIBRILLATION 01/21/2011  . CARDIAC PACEMAKER IN SITU 01/21/2011  . PARKINSON'S DISEASE 11/18/2007  . MYOCARDIAL INFARCTION 11/18/2007  . 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  SLEEP APNEA 11/18/2007    History  Smoking status  . Never Smoker   Smokeless tobacco  . Not on file    History  Alcohol Use No    Family History  Problem Relation Age of Onset  . Heart attack Mother   . Heart disease Mother   . Hypertension Mother   . Heart attack Father   . Other Father     renal calculi    Review of Systems: Constitutional: no fever chills diaphoresis or fatigue or change in weight.  Head and neck: no hearing loss, no epistaxis, no photophobia or visual disturbance. Respiratory: No cough, shortness of breath or wheezing. Cardiovascular: No chest pain peripheral edema, palpitations. Gastrointestinal: No abdominal distention, no abdominal pain, no change in bowel habits hematochezia or melena. Genitourinary: No dysuria, no frequency, no urgency, no  nocturia. Musculoskeletal:No arthralgias, no back pain, no gait disturbance or myalgias. Neurological: No dizziness, no headaches, no numbness, no seizures, no syncope, no weakness, no tremors. Hematologic: No lymphadenopathy, no easy bruising. Psychiatric: No confusion, no hallucinations, no sleep disturbance.    Physical Exam: Filed Vitals:   10/24/13 0935  BP: 114/73  Pulse: 65   the general appearance reveals a well-developed gentleman in no distress.  He has lost 7 pounds since last visit.  He has a deep brain stimulator implanted in the left anterior chest and his cardiac pacemaker in the right anterior chest..The head and neck exam reveals pupils equal and reactive.  Extraocular movements are full.  There is no scleral icterus.  The mouth and pharynx are normal.  The neck is supple.  The carotids reveal no bruits.  The jugular venous pressure is normal.  The  thyroid is not enlarged.  There is no lymphadenopathy.  The chest is clear to percussion and auscultation.  There are no rales or rhonchi.  Expansion of the chest is symmetrical.  The precordium is quiet.  The first heart sound is normal.  The second heart sound is physiologically split.  There is no murmur gallop rub or click.  There is no abnormal lift or heave.  The abdomen is soft and nontender.  The bowel sounds are normal.  The liver and spleen are not enlarged.  There are no abdominal masses.  There are no abdominal bruits.  Extremities reveal good pedal pulses.  There is no phlebitis or edema.  There is no cyanosis or clubbing.  Strength is normal and symmetrical in all extremities.  There is no lateralizing weakness.  There are no sensory deficits.  The skin is warm and dry.  There is no rash.     Assessment / Plan:  continue same medication.  Lab work today pending.  He goes to the gym twice a week for exercise class for Parkinson's patients.  He remains on a partially gluten free diet. Recheck in 4 months for followup  office visit CBC lipid panel hepatic function panel and basal metabolic panel.  He has not yet decided whether he wants to have additional positioning of the deep brain electrode or not. Since last visit the patient began having problems with bedwetting and saw his urologist who placed him on VESIcare and continue his Flomax and the bedwetting problem has subsided.

## 2013-10-24 NOTE — Assessment & Plan Note (Signed)
The patient is intolerant to statins but is on ezetimibe.  Blood work today is pending.

## 2013-10-24 NOTE — Assessment & Plan Note (Signed)
The patient has had no further episodes of syncope since placement of his pacemaker for AV block.

## 2013-10-24 NOTE — Assessment & Plan Note (Signed)
The patient has dementia has worsened over the past year according to the wife

## 2013-10-24 NOTE — Assessment & Plan Note (Signed)
The patient has not had any recurrent chest pain or angina.  He is less physically active but does go twice a week to Parkinson's exercise classes which are held at [facility].

## 2013-10-25 ENCOUNTER — Telehealth: Payer: Self-pay | Admitting: *Deleted

## 2013-10-25 NOTE — Telephone Encounter (Signed)
Mailed copy of labs and left message to call if any questions  

## 2013-10-25 NOTE — Telephone Encounter (Signed)
Message copied by Burnell Blanks on Tue Oct 25, 2013  2:41 PM ------      Message from: Cassell Clement      Created: Mon Oct 24, 2013  8:55 PM       Please report to patient.  The recent labs are stable. Continue same medication and careful diet. ------

## 2013-11-28 ENCOUNTER — Other Ambulatory Visit: Payer: Self-pay | Admitting: *Deleted

## 2013-11-28 MED ORDER — EZETIMIBE 10 MG PO TABS: 10.0000 mg | ORAL_TABLET | Freq: Every day | ORAL | Status: DC

## 2013-12-14 ENCOUNTER — Ambulatory Visit (INDEPENDENT_AMBULATORY_CARE_PROVIDER_SITE_OTHER): Payer: Medicare Other | Admitting: Internal Medicine

## 2013-12-14 ENCOUNTER — Encounter: Payer: Self-pay | Admitting: Internal Medicine

## 2013-12-14 VITALS — BP 101/71 | HR 75 | Ht 71.0 in | Wt 170.8 lb

## 2013-12-14 DIAGNOSIS — Z950 Presence of cardiac pacemaker: Secondary | ICD-10-CM

## 2013-12-14 DIAGNOSIS — I498 Other specified cardiac arrhythmias: Secondary | ICD-10-CM

## 2013-12-14 DIAGNOSIS — I442 Atrioventricular block, complete: Secondary | ICD-10-CM

## 2013-12-14 DIAGNOSIS — I4891 Unspecified atrial fibrillation: Secondary | ICD-10-CM

## 2013-12-14 DIAGNOSIS — R001 Bradycardia, unspecified: Secondary | ICD-10-CM

## 2013-12-14 LAB — MDC_IDC_ENUM_SESS_TYPE_INCLINIC
Battery Impedance: 694 Ohm
Battery Remaining Longevity: 43 mo
Battery Voltage: 2.74 V
Brady Statistic AP VP Percent: 97 %
Brady Statistic AP VS Percent: 0 %
Brady Statistic AS VP Percent: 3 %
Brady Statistic AS VS Percent: 0 %
Date Time Interrogation Session: 20150204120411
Lead Channel Impedance Value: 512 Ohm
Lead Channel Pacing Threshold Amplitude: 0.5 V
Lead Channel Pacing Threshold Amplitude: 1.75 V
Lead Channel Pacing Threshold Pulse Width: 0.4 ms
Lead Channel Sensing Intrinsic Amplitude: 2 mV
Lead Channel Setting Pacing Amplitude: 2.5 V
MDC IDC MSMT LEADCHNL RA IMPEDANCE VALUE: 484 Ohm
MDC IDC MSMT LEADCHNL RV PACING THRESHOLD PULSEWIDTH: 0.4 ms
MDC IDC MSMT LEADCHNL RV SENSING INTR AMPL: 11.2 mV
MDC IDC SET LEADCHNL RA PACING AMPLITUDE: 3.5 V
MDC IDC SET LEADCHNL RV PACING PULSEWIDTH: 0.4 ms
MDC IDC SET LEADCHNL RV SENSING SENSITIVITY: 2.8 mV

## 2013-12-14 NOTE — Progress Notes (Signed)
HPI [name] returns today for followup. He is a very pleasant 73 year old man with a history of symptomatic bradycardia, status post permanent pacemaker insertion, history of Parkinson's disease, with a neural stimulator in place. In the interim, he has been stable. He does have a mild tremor which is fairly well controlled. He had to undergo revision of his neural stimulator and he is healing nicely. Allergies  Allergen Reactions  . Bactrim   . Crestor [Rosuvastatin Calcium]   . Gluten   . Sulfamethoxazole-Trimethoprim      Current Outpatient Prescriptions  Medication Sig Dispense Refill  . aspirin 81 MG tablet Take 81 mg by mouth daily.        . carbidopa-levodopa (SINEMET) 25-100 MG per tablet Take 1 tablet by mouth at bedtime.        . Carbidopa-Levodopa-Entacapone (STALEVO 200 PO) Take 200 mg by mouth 4 (four) times daily.        . Cholecalciferol (VITAMIN D3) 2000 UNITS TABS Take by mouth.      . dapsone 25 MG tablet Take 25 mg by mouth 2 (two) times daily.        Marland Kitchen. docusate sodium (COLACE) 50 MG capsule Take by mouth 3 (three) times daily as needed for constipation.      Marland Kitchen. donepezil (ARICEPT) 10 MG tablet Take 10 mg by mouth daily.        Marland Kitchen. escitalopram (LEXAPRO) 10 MG tablet Take 10 mg by mouth daily.        Marland Kitchen. ezetimibe (ZETIA) 10 MG tablet Take 1 tablet (10 mg total) by mouth daily.  90 tablet  0  . nitroGLYCERIN (NITROSTAT) 0.4 MG SL tablet Place 1 tablet (0.4 mg total) under the tongue every 5 (five) minutes as needed.  100 tablet  3  . QUEtiapine (SEROQUEL) 25 MG tablet Take 25 mg by mouth at bedtime.      . solifenacin (VESICARE) 5 MG tablet Take 5 mg by mouth daily.      . Tamsulosin HCl (FLOMAX) 0.4 MG CAPS Take by mouth daily.        . vitamin B-12 (CYANOCOBALAMIN) 1000 MCG tablet Take 1,000 mcg by mouth daily.       No current facility-administered medications for this visit.     Past Medical History  Diagnosis Date  . Parkinson disease   . Bradycardia      syncope due to prolonged pauses, resolved with PPM  . Sleep apnea   . Hypertension   . Dermatitis   . Depression   . Rib fractures 2011    S/p fall  . Maxillary fracture 2009    Right  . BPH (benign prostatic hypertrophy)   . Coronary artery disease 1986    Inferior wall MI treated with Angioplasty  . Normal echocardiogram 2007    Mild LVH with Impaired relaxation, Mild-Mod. Aortic Root dilation, Mild Aortic Sclerosis, Mild MR, Mild TR, Ef-50-55%  . Normal nuclear stress test 2006    Normal EF-54%  . CAD (coronary artery disease)   . Inferior MI   . 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 Maxillary fracture 2009    Right side  . Rib fracture 07/2010  . Atrioventricular block, complete     ROS:   All systems reviewed and negative except as noted in the HPI.   Past Surgical History  Procedure Laterality Date  . Pacemaker insertion  10/28/10    MDT implanted by Dr Ladona Ridgelaylor  . Deep brain  stimulator placement      Parkinsons  . Arthroscopic knee      Left knee  . Cataract surgery      Bilateral  . Inguinal hernia repair      Bilateral  . Cardiac catheterization      Multiple Angioplasties, last 2002  . Removal of ganglion cyst      right wrist     Family History  Problem Relation Age of Onset  . Heart attack Mother   . Heart disease Mother   . Hypertension Mother   . Heart attack Father   . Other Father     renal calculi     History   Social History  . Marital Status: Married    Spouse Name: N/A    Number of Children: N/A  . Years of Education: N/A   Occupational History  . Not on file.   Social History Main Topics  . Smoking status: Never Smoker   . Smokeless tobacco: Not on file  . Alcohol Use: No  . Drug Use: No  . Sexual Activity: Not Currently   Other Topics Concern  . Not on file   Social History Narrative  . No narrative on file     BP 101/71  Pulse 75  Ht 5\' 11"  (1.803 m)  Wt 170 lb 12.8 oz (77.474 kg)  BMI 23.83  kg/m2  Physical Exam:  Well appearing, blunted affect, mild to moderate dyskinesia, NAD HEENT: Unremarkable Neck:  7 cm JVD, no thyromegally Lungs:  Clear with no wheezes, rales, or rhonchi. HEART:  Regular rate rhythm, no murmurs, no rubs, no clicks Abd:  soft, positive bowel sounds, no organomegally, no rebound, no guarding Ext:  2 plus pulses, no edema, no cyanosis, no clubbing Skin:  No rashes no nodules Neuro:  CN II through XII intact, motor grossly intact  EKG sinus rhythm with atrial pacing  DEVICE  Normal device function.  See PaceArt for details. Assess/Plan:

## 2013-12-14 NOTE — Assessment & Plan Note (Signed)
He is maintaining NSR 100% of the time. Will follow

## 2013-12-14 NOTE — Assessment & Plan Note (Signed)
His Medtronic DDD PM is working normally. Will recheck in several months. 

## 2013-12-14 NOTE — Patient Instructions (Signed)
Your physician recommends that you continue on your current medications as directed. Please refer to the Current Medication list given to you today.  Your physician wants you to follow-up in: 6 months with device clinic.  You will receive a reminder letter in the mail two months in advance. If you don't receive a letter, please call our office to schedule the follow-up appointment.  Your physician wants you to follow-up in: 1 year with Dr. Taylor. You will receive a reminder letter in the mail two months in advance. If you don't receive a letter, please call our office to schedule the follow-up appointment.  

## 2013-12-20 ENCOUNTER — Encounter: Payer: Self-pay | Admitting: Internal Medicine

## 2014-01-25 ENCOUNTER — Telehealth: Payer: Self-pay | Admitting: Family Medicine

## 2014-01-25 ENCOUNTER — Ambulatory Visit: Payer: Medicare Other | Admitting: *Deleted

## 2014-01-25 DIAGNOSIS — Z4802 Encounter for removal of sutures: Secondary | ICD-10-CM

## 2014-01-25 NOTE — Telephone Encounter (Signed)
Noted, I will look at and remove stitch if possible. If he needs to be seen by a provider, I have blocked a slot in [name], FNP schedule at 2:30.

## 2014-01-25 NOTE — Patient Instructions (Signed)
Stitch removed without difficulty. Instructed pts wife to keep area clean and dry and to watch for signs of infection. Advised to call the office if redness, swelling or irritation develops.

## 2014-01-25 NOTE — Telephone Encounter (Addendum)
Patient wife called and stated that her husband had surgery on Jan 28 th and patient found out there is one stitch still left at the top of his head. Sandra(RN) advised me to schedule him at 2:00pm on the nurses schedule today to remove stitch.

## 2014-02-16 ENCOUNTER — Other Ambulatory Visit: Payer: Self-pay | Admitting: Neurology

## 2014-02-24 ENCOUNTER — Other Ambulatory Visit: Payer: Self-pay | Admitting: Cardiology

## 2014-03-01 ENCOUNTER — Other Ambulatory Visit (INDEPENDENT_AMBULATORY_CARE_PROVIDER_SITE_OTHER): Payer: Medicare Other

## 2014-03-01 ENCOUNTER — Encounter (INDEPENDENT_AMBULATORY_CARE_PROVIDER_SITE_OTHER): Payer: Self-pay

## 2014-03-01 ENCOUNTER — Encounter: Payer: Self-pay | Admitting: Cardiology

## 2014-03-01 ENCOUNTER — Ambulatory Visit (INDEPENDENT_AMBULATORY_CARE_PROVIDER_SITE_OTHER): Payer: Medicare Other | Admitting: Cardiology

## 2014-03-01 VITALS — BP 112/72 | HR 64 | Ht 71.0 in | Wt 169.0 lb

## 2014-03-01 DIAGNOSIS — R05 Cough: Secondary | ICD-10-CM

## 2014-03-01 DIAGNOSIS — I2510 Atherosclerotic heart disease of native coronary artery without angina pectoris: Secondary | ICD-10-CM

## 2014-03-01 DIAGNOSIS — R059 Cough, unspecified: Secondary | ICD-10-CM

## 2014-03-01 DIAGNOSIS — E785 Hyperlipidemia, unspecified: Secondary | ICD-10-CM

## 2014-03-01 DIAGNOSIS — I259 Chronic ischemic heart disease, unspecified: Secondary | ICD-10-CM

## 2014-03-01 DIAGNOSIS — I442 Atrioventricular block, complete: Secondary | ICD-10-CM

## 2014-03-01 DIAGNOSIS — I4891 Unspecified atrial fibrillation: Secondary | ICD-10-CM

## 2014-03-01 LAB — CBC WITH DIFFERENTIAL/PLATELET
BASOS PCT: 0.6 % (ref 0.0–3.0)
Basophils Absolute: 0 10*3/uL (ref 0.0–0.1)
EOS ABS: 0.2 10*3/uL (ref 0.0–0.7)
Eosinophils Relative: 3.4 % (ref 0.0–5.0)
HCT: 44.1 % (ref 39.0–52.0)
HEMOGLOBIN: 14.1 g/dL (ref 13.0–17.0)
LYMPHS PCT: 27.1 % (ref 12.0–46.0)
Lymphs Abs: 1.7 10*3/uL (ref 0.7–4.0)
MCHC: 31.9 g/dL (ref 30.0–36.0)
MCV: 88.4 fl (ref 78.0–100.0)
Monocytes Absolute: 0.5 10*3/uL (ref 0.1–1.0)
Monocytes Relative: 8.7 % (ref 3.0–12.0)
Neutro Abs: 3.7 10*3/uL (ref 1.4–7.7)
Neutrophils Relative %: 60.2 % (ref 43.0–77.0)
Platelets: 318 10*3/uL (ref 150.0–400.0)
RBC: 4.99 Mil/uL (ref 4.22–5.81)
RDW: 14.6 % (ref 11.5–14.6)
WBC: 6.1 10*3/uL (ref 4.5–10.5)

## 2014-03-01 LAB — HEPATIC FUNCTION PANEL
ALBUMIN: 3.8 g/dL (ref 3.5–5.2)
ALT: 16 U/L (ref 0–53)
AST: 26 U/L (ref 0–37)
Alkaline Phosphatase: 77 U/L (ref 39–117)
Bilirubin, Direct: 0 mg/dL (ref 0.0–0.3)
Total Bilirubin: 0.8 mg/dL (ref 0.3–1.2)
Total Protein: 6.8 g/dL (ref 6.0–8.3)

## 2014-03-01 LAB — BASIC METABOLIC PANEL
BUN: 13 mg/dL (ref 6–23)
CALCIUM: 9.4 mg/dL (ref 8.4–10.5)
CHLORIDE: 103 meq/L (ref 96–112)
CO2: 30 mEq/L (ref 19–32)
Creatinine, Ser: 0.8 mg/dL (ref 0.4–1.5)
GFR: 94.62 mL/min (ref >60.00)
Glucose, Bld: 87 mg/dL (ref 70–99)
Potassium: 4.6 mEq/L (ref 3.5–5.1)
Sodium: 138 mEq/L (ref 135–145)

## 2014-03-01 LAB — LIPID PANEL
CHOL/HDL RATIO: 3
Cholesterol: 143 mg/dL (ref 0–200)
HDL: 48.7 mg/dL (ref >39.00)
LDL CALC: 80 mg/dL (ref 0–99)
TRIGLYCERIDES: 74 mg/dL (ref 0.0–149.0)
VLDL: 14.8 mg/dL (ref 0.0–40.0)

## 2014-03-01 NOTE — Assessment & Plan Note (Signed)
The patient has not been having any recurrent chest pain or angina. 

## 2014-03-01 NOTE — Assessment & Plan Note (Signed)
The patient has a history of hyperlipidemia.  He is intolerant of statins.  He is on ezetimibe.  We are checking labs today.

## 2014-03-01 NOTE — Patient Instructions (Addendum)
Will obtain labs today and call you with the results (lp/bmet/hfp/cbc)  Your physician recommends that you continue on your current medications as directed. Please refer to the Current Medication list given to you today.  Your physician wants you to follow-up in: 4 months with fasting labs (lp/bmet/hfp/cbc) You will receive a reminder letter in the mail two months in advance. If you don't receive a letter, please call our office to schedule the follow-up appointment.  

## 2014-03-01 NOTE — Assessment & Plan Note (Signed)
His cardiac pacemaker is working well and the patient has had no further syncopal episodes.

## 2014-03-01 NOTE — Progress Notes (Signed)
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 He has a past history of known coronary artery disease with a remote inferior wall myocardial infarction in 1986. He was treated with emergent angioplasty at that time. He had a repeat angioplasty in 2002. He had syncope in December 2011 and had long periods of asystole and has a dual-chamber pacemaker implanted since then. 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 Danella DeisGruber. He has severe dementia as well as severe Parkinson's and he does have a deep brain stimulator in place on the left side. He has had some falling spells. He is [facility].  Since last visit he has had his generator battery change October 15 for his deep stimulator.  More recently the patient had repositioning of the brain electrode.  [facility] on January 28 and again on April 1.  He goes back on May 14 for reprogramming of the brain stimulator. The patient has a history of bladder spasms and sees urology Dr. McDiarmid who recently started him on myrbetriq.  Current Outpatient Prescriptions  Medication Sig Dispense Refill  . aspirin 81 MG tablet Take 81 mg by mouth daily.        . carbidopa-levodopa (SINEMET) 25-100 MG per tablet Take 1 tablet by mouth at bedtime.        . Carbidopa-Levodopa-Entacapone (STALEVO 200 PO) Take 200 mg by mouth 4 (four) times daily.        . Cholecalciferol (VITAMIN D3) 2000 UNITS TABS Take by mouth.      . dapsone 25 MG tablet Take 25 mg by mouth 2 (two) times daily.        Marland Kitchen. donepezil (ARICEPT) 10 MG tablet Take 10 mg by mouth daily.        Marland Kitchen. escitalopram (LEXAPRO) 10 MG tablet Take 10 mg by mouth daily.        .  mirabegron ER (MYRBETRIQ) 25 MG TB24 tablet Take 25 mg by mouth daily.      . nitroGLYCERIN (NITROSTAT) 0.4 MG SL tablet Place 1 tablet (0.4 mg total) under the tongue every 5 (five) minutes as needed.  100 tablet  3  . QUEtiapine (SEROQUEL) 25 MG tablet Take 25 mg by mouth at bedtime.      . Tamsulosin HCl (FLOMAX) 0.4 MG CAPS Take by mouth daily.        . vitamin B-12 (CYANOCOBALAMIN) 1000 MCG tablet Take 1,000 mcg by mouth daily.      Marland Kitchen. ZETIA 10 MG tablet TAKE 1 TABLET (10 MG TOTAL) BY MOUTH DAILY.  90 tablet  0   No current facility-administered medications for this visit.    Allergies  Allergen Reactions  . Bactrim   . Crestor [Rosuvastatin Calcium]   . Gluten   . Sulfamethoxazole-Trimethoprim Rash    Patient Active Problem List   Diagnosis Date Noted  . Postsurgical percutaneous transluminal coronary angioplasty status 08/23/2013  . Healed myocardial infarct 08/23/2013  . Obstructive apnea 08/23/2013  . Artificial cardiac pacemaker 08/23/2013  . Unspecified vitamin D deficiency 02/03/2013  . B12 deficiency 02/03/2013  . Dizziness 06/10/2012  . Dermatochalasis of eyelid 03/04/2012  . Error, refractive, myopia 03/04/2012  .  Posterior vitreous detachment 03/04/2012  . Pseudoaphakia 03/04/2012  . Atrioventricular block, complete   . Idiopathic Parkinson's disease 08/29/2011  . Hematoma of chest wall 08/26/2011  . Hyperlipidemia 08/22/2011  . CAD (coronary artery disease) 08/22/2011  . Dementia 08/22/2011  . ATRIAL FIBRILLATION 01/21/2011  . CARDIAC PACEMAKER IN SITU 01/21/2011  . PARKINSON'S DISEASE 11/18/2007  . MYOCARDIAL INFARCTION 11/18/2007  . 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 SLEEP APNEA 11/18/2007    History  Smoking status  . Never Smoker   Smokeless tobacco  . Not on file    History  Alcohol Use No    Family History  Problem Relation Age of Onset  . Heart attack Mother   . Heart disease Mother   . Hypertension Mother   . Heart attack Father    . Other Father     renal calculi    Review of Systems: Constitutional: no fever chills diaphoresis or fatigue or change in weight.  Head and neck: no hearing loss, no epistaxis, no photophobia or visual disturbance. Respiratory: No cough, shortness of breath or wheezing. Cardiovascular: No chest pain peripheral edema, palpitations. Gastrointestinal: No abdominal distention, no abdominal pain, no change in bowel habits hematochezia or melena. Genitourinary: No dysuria, no frequency, no urgency, no nocturia. Musculoskeletal:No arthralgias, no back pain, no gait disturbance or myalgias. Neurological: No dizziness, no headaches, no numbness, no seizures, no syncope, no weakness, no tremors. Hematologic: No lymphadenopathy, no easy bruising. Psychiatric: No confusion, no hallucinations, no sleep disturbance.    Physical Exam: Filed Vitals:   03/01/14 0915  BP: 112/72  Pulse: 64   the general appearance reveals a well-developed gentleman in no distress.  He has lost 7 pounds since last visit.  He has a deep brain stimulator implanted in the left anterior chest and his cardiac pacemaker in the right anterior chest..The head and neck exam reveals pupils equal and reactive.  Extraocular movements are full.  There is no scleral icterus.  The mouth and pharynx are normal.  The neck is supple.  The carotids reveal no bruits.  The jugular venous pressure is normal.  The  thyroid is not enlarged.  There is no lymphadenopathy.  The chest is clear to percussion and auscultation.  There are no rales or rhonchi.  Expansion of the chest is symmetrical.  The precordium is quiet.  The first heart sound is normal.  The second heart sound is physiologically split.  There is no murmur gallop rub or click.  There is no abnormal lift or heave.  The abdomen is soft and nontender.  The bowel sounds are normal.  The liver and spleen are not enlarged.  There are no abdominal masses.  There are no abdominal bruits.   Extremities reveal good pedal pulses.  There is no phlebitis or edema.  There is no cyanosis or clubbing.  Strength is normal and symmetrical in all extremities.  There is no lateralizing weakness.  There are no sensory deficits.  The skin is warm and dry.  There is no rash.     Assessment / Plan:  continue same medication.  Lab work today pending.    He remains on a partially gluten free diet. Recheck in 4 months for followup office visit CBC lipid panel hepatic function panel and basal metabolic panel.

## 2014-03-02 ENCOUNTER — Telehealth: Payer: Self-pay | Admitting: *Deleted

## 2014-03-02 NOTE — Telephone Encounter (Signed)
Mailed copy of labs and left message to call if any questions  

## 2014-03-02 NOTE — Telephone Encounter (Signed)
Message copied by Burnell Blanks on Thu Mar 02, 2014 12:15 PM ------      Message from: Cassell Clement      Created: Thu Mar 02, 2014 11:17 AM       Please report to patient.  The recent labs are stable. Continue same medication and careful diet.  Send a copy of CBC to Dr. Everlene Farrier ------

## 2014-03-02 NOTE — Progress Notes (Signed)
Quick Note:  Please report to patient. The recent labs are stable. Continue same medication and careful diet. Send a copy of CBC to Dr. Everlene Farrier ______

## 2014-03-03 ENCOUNTER — Ambulatory Visit: Payer: Medicare Other | Admitting: Family Medicine

## 2014-03-06 ENCOUNTER — Ambulatory Visit (INDEPENDENT_AMBULATORY_CARE_PROVIDER_SITE_OTHER): Payer: Medicare Other | Admitting: Family Medicine

## 2014-03-06 ENCOUNTER — Encounter: Payer: Self-pay | Admitting: Family Medicine

## 2014-03-06 VITALS — BP 110/72 | HR 68 | Temp 98.0°F | Resp 16 | Ht 67.0 in | Wt 171.2 lb

## 2014-03-06 DIAGNOSIS — I259 Chronic ischemic heart disease, unspecified: Secondary | ICD-10-CM

## 2014-03-06 DIAGNOSIS — M8580 Other specified disorders of bone density and structure, unspecified site: Secondary | ICD-10-CM | POA: Insufficient documentation

## 2014-03-06 DIAGNOSIS — G20 Parkinson's disease: Secondary | ICD-10-CM

## 2014-03-06 DIAGNOSIS — M949 Disorder of cartilage, unspecified: Secondary | ICD-10-CM

## 2014-03-06 DIAGNOSIS — E785 Hyperlipidemia, unspecified: Secondary | ICD-10-CM

## 2014-03-06 DIAGNOSIS — F0390 Unspecified dementia without behavioral disturbance: Secondary | ICD-10-CM

## 2014-03-06 DIAGNOSIS — M899 Disorder of bone, unspecified: Secondary | ICD-10-CM

## 2014-03-06 DIAGNOSIS — E559 Vitamin D deficiency, unspecified: Secondary | ICD-10-CM

## 2014-03-06 NOTE — Patient Instructions (Signed)
Follow up in 1 year or as needed We'll notify you of your Vit D level and make any changes if needed Someone will call you to set up your bone density Call with any questions or concerns Hang in there!  You're doing great!

## 2014-03-06 NOTE — Progress Notes (Signed)
   Subjective:    Patient ID: [name], male    DOB: February 12, 1941, 73 y.o.   MRN: [id number]  HPI Hyperlipidemia- chronic problem, on Zetia.  Recent labs show good control.  No abd pain, N/V  Dementia- chronic problem, wife reports memory improves when DBS is decreased.  On Aricept, Seroquel.  Parkinson's- following w/ Dr Rubin Payor, has DBS.  Has had multiple issues this year w/ lead replacement and programming.  Has been riding stationary bike at home.  Osteopenia- noted on CXR done on 3/23.  Hx of Vit D deficiency.  Currently on Vit D but not taking Calcium.  Has not had DEXA.  UTD on urology w/ Dr Sherron Monday  Review of Systems Denies CP, SOB, HAs, blurry/double vision, no N/V.    Objective:   Physical Exam  Vitals reviewed. Constitutional: He appears well-developed and well-nourished. No distress.  HENT:  Head: Normocephalic and atraumatic.  Neck: Neck supple.  Cardiovascular: Normal rate, regular rhythm, normal heart sounds and intact distal pulses.   Pulmonary/Chest: Effort normal and breath sounds normal. No respiratory distress. He has no wheezes. He has no rales.  Musculoskeletal: He exhibits no edema.  Notable scoliosis  Lymphadenopathy:    He has no cervical adenopathy.  Neurological: He is alert. Coordination (ambulates w/ walker) abnormal.  Oriented to person and place  Skin: Skin is warm and dry.          Assessment & Plan:

## 2014-03-06 NOTE — Assessment & Plan Note (Signed)
New.  Noted on CXR.  Due for formal DEXA.  Order entered.  Pt on Vit D daily, start Calcium 1200 units daily.  Will follow.

## 2014-03-06 NOTE — Assessment & Plan Note (Signed)
Chronic problem, following w/ neuro at [facility].  Wife and pt report cognitive abilities are stable.  Will follow.

## 2014-03-06 NOTE — Assessment & Plan Note (Signed)
Chronic problem.  Due for labs.  Increase daily supplement as needed

## 2014-03-06 NOTE — Assessment & Plan Note (Signed)
Chronic problem.  Tolerating zetia w/o difficulty.  Recent labs look good.  No need to repeat.  No med changes at this time.

## 2014-03-06 NOTE — Assessment & Plan Note (Signed)
Pt has had multiple issues w/ his DBS this year.  Has another adjustment scheduled for May.  Continues to follow at [facility].  Will follow along.

## 2014-03-09 ENCOUNTER — Other Ambulatory Visit: Payer: Medicare Other

## 2014-03-10 LAB — VITAMIN D 1,25 DIHYDROXY
Vitamin D 1, 25 (OH)2 Total: 45 pg/mL (ref 18–72)
Vitamin D2 1, 25 (OH)2: <8 pg/mL
Vitamin D3 1, 25 (OH)2: 45 pg/mL

## 2014-03-13 ENCOUNTER — Ambulatory Visit (INDEPENDENT_AMBULATORY_CARE_PROVIDER_SITE_OTHER)
Admission: RE | Admit: 2014-03-13 | Discharge: 2014-03-13 | Disposition: A | Discharge status: Home / Self Care | Payer: Medicare Other | Source: Ambulatory Visit | Attending: Family Medicine | Admitting: Family Medicine

## 2014-03-13 DIAGNOSIS — M8580 Other specified disorders of bone density and structure, unspecified site: Secondary | ICD-10-CM

## 2014-03-13 DIAGNOSIS — M949 Disorder of cartilage, unspecified: Secondary | ICD-10-CM

## 2014-03-13 DIAGNOSIS — M899 Disorder of bone, unspecified: Secondary | ICD-10-CM

## 2014-03-20 ENCOUNTER — Encounter: Payer: Self-pay | Admitting: General Practice

## 2014-03-20 ENCOUNTER — Telehealth: Payer: Self-pay | Admitting: Internal Medicine

## 2014-03-20 NOTE — Telephone Encounter (Signed)
New problem   Pt want to know when does he do his remote check over the phone . Please call pt

## 2014-03-20 NOTE — Telephone Encounter (Signed)
Pt to send transmission after 1500. Wife instructed to call us or MDT if any problems.

## 2014-03-21 ENCOUNTER — Telehealth: Payer: Self-pay | Admitting: Cardiology

## 2014-03-21 ENCOUNTER — Ambulatory Visit (INDEPENDENT_AMBULATORY_CARE_PROVIDER_SITE_OTHER): Payer: Medicare Other | Admitting: *Deleted

## 2014-03-21 DIAGNOSIS — I442 Atrioventricular block, complete: Secondary | ICD-10-CM

## 2014-03-21 LAB — MDC_IDC_ENUM_SESS_TYPE_REMOTE
Battery Impedance: 849 Ohm
Battery Remaining Longevity: 3.5
Brady Statistic AP VP Percent: 98.2 %
Brady Statistic AP VS Percent: 0.1 % — CL
Brady Statistic AS VP Percent: 1.8 %
Brady Statistic AS VS Percent: 0.1 % — CL
Lead Channel Impedance Value: 484 Ohm
Lead Channel Pacing Threshold Amplitude: 2 V
Lead Channel Pacing Threshold Pulse Width: 0.4 ms
Lead Channel Pacing Threshold Pulse Width: 0.4 ms
Lead Channel Setting Pacing Pulse Width: 0.4 ms
MDC IDC MSMT BATTERY VOLTAGE: 2.76 V
MDC IDC MSMT LEADCHNL RV IMPEDANCE VALUE: 516 Ohm
MDC IDC MSMT LEADCHNL RV PACING THRESHOLD AMPLITUDE: 0.625 V
MDC IDC SET LEADCHNL RA PACING AMPLITUDE: 4 V
MDC IDC SET LEADCHNL RV PACING AMPLITUDE: 2.5 V
MDC IDC SET LEADCHNL RV SENSING SENSITIVITY: 2.8 mV

## 2014-03-21 NOTE — Telephone Encounter (Signed)
Reminded pt wife of transmission that needs to be sent in today.

## 2014-03-22 NOTE — Progress Notes (Signed)
Remote pacemaker transmission.   

## 2014-04-07 ENCOUNTER — Encounter: Payer: Self-pay | Admitting: Cardiology

## 2014-04-18 ENCOUNTER — Encounter: Payer: Self-pay | Admitting: Internal Medicine

## 2014-04-25 ENCOUNTER — Telehealth: Payer: Self-pay | Admitting: *Deleted

## 2014-04-25 NOTE — Telephone Encounter (Signed)
Caller name:  Consuella Lose Relation to pt:  wife Call back number: [phone]  Pharmacy:  Reason for call:   Pt wife called.  States pt keeps getting reminder emails for colonoscopy and tetanus shot.  Regarding the colonoscopy, pt has neuro stimulator that has to be turned off during colonoscopy and wife wants to know if it would be OK to do hemoccult instead.  Please advise.  bw

## 2014-04-26 NOTE — Telephone Encounter (Signed)
Called and notified pt wife on voicemail that ifob was mailed.

## 2014-04-26 NOTE — Telephone Encounter (Signed)
Yes- ok to send iFOB to pt for colon cancer screening

## 2014-05-04 ENCOUNTER — Other Ambulatory Visit: Payer: Self-pay | Admitting: Family Medicine

## 2014-05-04 ENCOUNTER — Other Ambulatory Visit (INDEPENDENT_AMBULATORY_CARE_PROVIDER_SITE_OTHER): Payer: Medicare Other

## 2014-05-04 DIAGNOSIS — Z1211 Encounter for screening for malignant neoplasm of colon: Secondary | ICD-10-CM

## 2014-05-04 LAB — FECAL OCCULT BLOOD, IMMUNOCHEMICAL: FECAL OCCULT BLD: NEGATIVE

## 2014-05-05 ENCOUNTER — Encounter (HOSPITAL_COMMUNITY): Payer: Self-pay | Admitting: Emergency Medicine

## 2014-05-05 ENCOUNTER — Inpatient Hospital Stay (HOSPITAL_COMMUNITY)
Admission: EM | Admit: 2014-05-05 | Discharge: 2014-05-09 | DRG: 864 | Disposition: A | Discharge status: Rehabilitation Facility | Payer: Medicare Other | Attending: Internal Medicine | Admitting: Internal Medicine

## 2014-05-05 ENCOUNTER — Emergency Department (HOSPITAL_COMMUNITY): Payer: Medicare Other

## 2014-05-05 DIAGNOSIS — R42 Dizziness and giddiness: Secondary | ICD-10-CM

## 2014-05-05 DIAGNOSIS — R0902 Hypoxemia: Secondary | ICD-10-CM

## 2014-05-05 DIAGNOSIS — E559 Vitamin D deficiency, unspecified: Secondary | ICD-10-CM

## 2014-05-05 DIAGNOSIS — I2510 Atherosclerotic heart disease of native coronary artery without angina pectoris: Secondary | ICD-10-CM | POA: Diagnosis present

## 2014-05-05 DIAGNOSIS — Z950 Presence of cardiac pacemaker: Secondary | ICD-10-CM

## 2014-05-05 DIAGNOSIS — L03319 Cellulitis of trunk, unspecified: Secondary | ICD-10-CM

## 2014-05-05 DIAGNOSIS — F0390 Unspecified dementia without behavioral disturbance: Secondary | ICD-10-CM | POA: Diagnosis present

## 2014-05-05 DIAGNOSIS — G4733 Obstructive sleep apnea (adult) (pediatric): Secondary | ICD-10-CM

## 2014-05-05 DIAGNOSIS — L130 Dermatitis herpetiformis: Secondary | ICD-10-CM

## 2014-05-05 DIAGNOSIS — L02219 Cutaneous abscess of trunk, unspecified: Secondary | ICD-10-CM | POA: Diagnosis present

## 2014-05-05 DIAGNOSIS — I10 Essential (primary) hypertension: Secondary | ICD-10-CM | POA: Diagnosis present

## 2014-05-05 DIAGNOSIS — F3289 Other specified depressive episodes: Secondary | ICD-10-CM | POA: Diagnosis present

## 2014-05-05 DIAGNOSIS — J189 Pneumonia, unspecified organism: Secondary | ICD-10-CM

## 2014-05-05 DIAGNOSIS — I252 Old myocardial infarction: Secondary | ICD-10-CM

## 2014-05-05 DIAGNOSIS — E538 Deficiency of other specified B group vitamins: Secondary | ICD-10-CM

## 2014-05-05 DIAGNOSIS — Z9861 Coronary angioplasty status: Secondary | ICD-10-CM

## 2014-05-05 DIAGNOSIS — I442 Atrioventricular block, complete: Secondary | ICD-10-CM

## 2014-05-05 DIAGNOSIS — I447 Left bundle-branch block, unspecified: Secondary | ICD-10-CM | POA: Diagnosis present

## 2014-05-05 DIAGNOSIS — R509 Fever, unspecified: Principal | ICD-10-CM | POA: Diagnosis present

## 2014-05-05 DIAGNOSIS — E785 Hyperlipidemia, unspecified: Secondary | ICD-10-CM

## 2014-05-05 DIAGNOSIS — G20A1 Parkinson's disease without dyskinesia, without mention of fluctuations: Secondary | ICD-10-CM | POA: Diagnosis present

## 2014-05-05 DIAGNOSIS — N318 Other neuromuscular dysfunction of bladder: Secondary | ICD-10-CM | POA: Diagnosis present

## 2014-05-05 DIAGNOSIS — N400 Enlarged prostate without lower urinary tract symptoms: Secondary | ICD-10-CM | POA: Diagnosis present

## 2014-05-05 DIAGNOSIS — I4891 Unspecified atrial fibrillation: Secondary | ICD-10-CM | POA: Diagnosis present

## 2014-05-05 DIAGNOSIS — Z79899 Other long term (current) drug therapy: Secondary | ICD-10-CM

## 2014-05-05 DIAGNOSIS — Z961 Presence of intraocular lens: Secondary | ICD-10-CM

## 2014-05-05 DIAGNOSIS — M8580 Other specified disorders of bone density and structure, unspecified site: Secondary | ICD-10-CM

## 2014-05-05 DIAGNOSIS — G4730 Sleep apnea, unspecified: Secondary | ICD-10-CM

## 2014-05-05 DIAGNOSIS — I219 Acute myocardial infarction, unspecified: Secondary | ICD-10-CM

## 2014-05-05 DIAGNOSIS — Z888 Allergy status to other drugs, medicaments and biological substances status: Secondary | ICD-10-CM

## 2014-05-05 DIAGNOSIS — Z7982 Long term (current) use of aspirin: Secondary | ICD-10-CM

## 2014-05-05 DIAGNOSIS — J690 Pneumonitis due to inhalation of food and vomit: Secondary | ICD-10-CM

## 2014-05-05 DIAGNOSIS — F329 Major depressive disorder, single episode, unspecified: Secondary | ICD-10-CM | POA: Diagnosis present

## 2014-05-05 DIAGNOSIS — G9340 Encephalopathy, unspecified: Secondary | ICD-10-CM | POA: Diagnosis present

## 2014-05-05 DIAGNOSIS — G20 Parkinson's disease: Secondary | ICD-10-CM

## 2014-05-05 HISTORY — DX: Presence of cardiac pacemaker: Z95.0

## 2014-05-05 HISTORY — DX: Personal history of other diseases of the digestive system: Z87.19

## 2014-05-05 LAB — CBC WITH DIFFERENTIAL/PLATELET
Basophils Absolute: 0 K/uL (ref 0.0–0.1)
Basophils Relative: 0 % (ref 0–1)
Eosinophils Absolute: 0 K/uL (ref 0.0–0.7)
Eosinophils Relative: 0 % (ref 0–5)
HCT: 41.1 % (ref 39.0–52.0)
Hemoglobin: 13.1 g/dL (ref 13.0–17.0)
Lymphocytes Relative: 11 % — ABNORMAL LOW (ref 12–46)
Lymphs Abs: 0.9 K/uL (ref 0.7–4.0)
MCH: 27.9 pg (ref 26.0–34.0)
MCHC: 31.9 g/dL (ref 30.0–36.0)
MCV: 87.4 fL (ref 78.0–100.0)
Monocytes Absolute: 0.9 K/uL (ref 0.1–1.0)
Monocytes Relative: 12 % (ref 3–12)
Neutro Abs: 6 K/uL (ref 1.7–7.7)
Neutrophils Relative %: 77 % (ref 43–77)
Platelets: 289 K/uL (ref 150–400)
RBC: 4.7 MIL/uL (ref 4.22–5.81)
RDW: 14.6 % (ref 11.5–15.5)
WBC: 7.8 K/uL (ref 4.0–10.5)

## 2014-05-05 LAB — URINALYSIS, ROUTINE W REFLEX MICROSCOPIC
Bilirubin Urine: NEGATIVE
Glucose, UA: NEGATIVE mg/dL
Hgb urine dipstick: NEGATIVE
Ketones, ur: 15 mg/dL — AB
Leukocytes, UA: NEGATIVE
Nitrite: NEGATIVE
Protein, ur: 30 mg/dL — AB
Specific Gravity, Urine: 1.023 (ref 1.005–1.030)
Urobilinogen, UA: 1 mg/dL (ref 0.0–1.0)
pH: 8.5 — ABNORMAL HIGH (ref 5.0–8.0)

## 2014-05-05 LAB — I-STAT CHEM 8, ED
BUN: 13 mg/dL (ref 6–23)
Calcium, Ion: 1.13 mmol/L (ref 1.13–1.30)
Chloride: 98 meq/L (ref 96–112)
Creatinine, Ser: 0.9 mg/dL (ref 0.50–1.35)
Glucose, Bld: 109 mg/dL — ABNORMAL HIGH (ref 70–99)
HCT: 43 % (ref 39.0–52.0)
Hemoglobin: 14.6 g/dL (ref 13.0–17.0)
Potassium: 4.1 meq/L (ref 3.7–5.3)
Sodium: 136 meq/L — ABNORMAL LOW (ref 137–147)
TCO2: 23 mmol/L (ref 0–100)

## 2014-05-05 LAB — COMPREHENSIVE METABOLIC PANEL WITH GFR
ALT: 13 U/L (ref 0–53)
AST: 27 U/L (ref 0–37)
Albumin: 3.8 g/dL (ref 3.5–5.2)
Alkaline Phosphatase: 95 U/L (ref 39–117)
BUN: 13 mg/dL (ref 6–23)
CO2: 25 meq/L (ref 19–32)
Calcium: 9.1 mg/dL (ref 8.4–10.5)
Chloride: 97 meq/L (ref 96–112)
Creatinine, Ser: 0.78 mg/dL (ref 0.50–1.35)
GFR calc Af Amer: >90 mL/min (ref >90)
GFR calc non Af Amer: 86 mL/min — ABNORMAL LOW (ref >90)
Glucose, Bld: 113 mg/dL — ABNORMAL HIGH (ref 70–99)
Potassium: 4.3 meq/L (ref 3.7–5.3)
Sodium: 134 meq/L — ABNORMAL LOW (ref 137–147)
Total Bilirubin: 0.8 mg/dL (ref 0.3–1.2)
Total Protein: 7.1 g/dL (ref 6.0–8.3)

## 2014-05-05 LAB — PROTIME-INR
INR: 1.22 (ref 0.00–1.49)
Prothrombin Time: 15.4 seconds — ABNORMAL HIGH (ref 11.6–15.2)

## 2014-05-05 LAB — URINE MICROSCOPIC-ADD ON

## 2014-05-05 LAB — TROPONIN I: Troponin I: <0.3 ng/mL (ref <0.30)

## 2014-05-05 LAB — I-STAT CG4 LACTIC ACID, ED: Lactic Acid, Venous: 1.03 mmol/L (ref 0.5–2.2)

## 2014-05-05 MED ORDER — SODIUM CHLORIDE 0.9 % IV SOLN
INTRAVENOUS | Status: AC
  Administered 2014-05-05: 1000 mL via INTRAVENOUS

## 2014-05-05 MED ORDER — SODIUM CHLORIDE 0.9 % IV SOLN
INTRAVENOUS | Status: AC
  Administered 2014-05-06: 02:00:00 via INTRAVENOUS

## 2014-05-05 MED ORDER — ENTACAPONE 200 MG PO TABS
200.0000 mg | ORAL_TABLET | Freq: Four times a day (QID) | ORAL | Status: DC
  Administered 2014-05-06 – 2014-05-09 (×15): 200 mg via ORAL
  Filled 2014-05-05 (×17): qty 1

## 2014-05-05 MED ORDER — MIRABEGRON ER 50 MG PO TB24
50.0000 mg | ORAL_TABLET | Freq: Every day | ORAL | Status: DC
  Administered 2014-05-05 – 2014-05-09 (×5): 50 mg via ORAL
  Filled 2014-05-05 (×5): qty 1

## 2014-05-05 MED ORDER — ONDANSETRON HCL 4 MG/2ML IJ SOLN: 4.0000 mg | Freq: Four times a day (QID) | INTRAMUSCULAR | Status: DC | PRN

## 2014-05-05 MED ORDER — PIPERACILLIN-TAZOBACTAM 3.375 G IVPB
3.3750 g | Freq: Three times a day (TID) | INTRAVENOUS | Status: DC
  Administered 2014-05-06 – 2014-05-08 (×9): 3.375 g via INTRAVENOUS
  Filled 2014-05-05 (×11): qty 50

## 2014-05-05 MED ORDER — ONDANSETRON HCL 4 MG PO TABS: 4.0000 mg | ORAL_TABLET | Freq: Four times a day (QID) | ORAL | Status: DC | PRN

## 2014-05-05 MED ORDER — DAPSONE 25 MG PO TABS
25.0000 mg | ORAL_TABLET | Freq: Two times a day (BID) | ORAL | Status: DC
  Administered 2014-05-06 – 2014-05-09 (×8): 25 mg via ORAL
  Filled 2014-05-05 (×9): qty 1

## 2014-05-05 MED ORDER — MAGNESIUM OXIDE 400 (241.3 MG) MG PO TABS
400.0000 mg | ORAL_TABLET | Freq: Every day | ORAL | Status: DC
  Administered 2014-05-05 – 2014-05-09 (×5): 400 mg via ORAL
  Filled 2014-05-05 (×5): qty 1

## 2014-05-05 MED ORDER — DONEPEZIL HCL 10 MG PO TABS
10.0000 mg | ORAL_TABLET | Freq: Every day | ORAL | Status: DC
  Administered 2014-05-05 – 2014-05-09 (×5): 10 mg via ORAL
  Filled 2014-05-05 (×5): qty 1

## 2014-05-05 MED ORDER — BIOTENE DRY MOUTH MT LIQD
15.0000 mL | Freq: Two times a day (BID) | OROMUCOSAL | Status: DC
  Administered 2014-05-06 – 2014-05-09 (×7): 15 mL via OROMUCOSAL

## 2014-05-05 MED ORDER — VANCOMYCIN HCL IN DEXTROSE 1-5 GM/200ML-% IV SOLN
1000.0000 mg | Freq: Once | INTRAVENOUS | Status: AC
  Administered 2014-05-05: 1000 mg via INTRAVENOUS
  Filled 2014-05-05: qty 200

## 2014-05-05 MED ORDER — QUETIAPINE FUMARATE 25 MG PO TABS
25.0000 mg | ORAL_TABLET | Freq: Every day | ORAL | Status: DC
  Administered 2014-05-06 – 2014-05-08 (×4): 25 mg via ORAL
  Filled 2014-05-05 (×5): qty 1

## 2014-05-05 MED ORDER — CARBIDOPA-LEVODOPA 25-100 MG PO TABS
1.0000 | ORAL_TABLET | Freq: Every day | ORAL | Status: DC
  Administered 2014-05-06 – 2014-05-08 (×4): 1 via ORAL
  Filled 2014-05-05 (×5): qty 1

## 2014-05-05 MED ORDER — CARBIDOPA-LEVODOPA-ENTACAPONE 50-200-200 MG PO TABS: 1.0000 | ORAL_TABLET | Freq: Four times a day (QID) | ORAL | Status: DC

## 2014-05-05 MED ORDER — ACETAMINOPHEN 325 MG PO TABS: 650.0000 mg | ORAL_TABLET | Freq: Four times a day (QID) | ORAL | Status: DC | PRN

## 2014-05-05 MED ORDER — MAGNESIUM 400 MG PO CAPS: 400.0000 mg | ORAL_CAPSULE | Freq: Every day | ORAL | Status: DC

## 2014-05-05 MED ORDER — SODIUM CHLORIDE 0.9 % IV SOLN
1000.0000 mL | INTRAVENOUS | Status: DC
  Administered 2014-05-05: 1000 mL via INTRAVENOUS

## 2014-05-05 MED ORDER — NITROGLYCERIN 0.4 MG SL SUBL: 0.4000 mg | SUBLINGUAL_TABLET | SUBLINGUAL | Status: DC | PRN

## 2014-05-05 MED ORDER — EZETIMIBE 10 MG PO TABS
10.0000 mg | ORAL_TABLET | Freq: Every day | ORAL | Status: DC
  Administered 2014-05-05 – 2014-05-09 (×5): 10 mg via ORAL
  Filled 2014-05-05 (×5): qty 1

## 2014-05-05 MED ORDER — SODIUM CHLORIDE 0.9 % IJ SOLN
3.0000 mL | Freq: Two times a day (BID) | INTRAMUSCULAR | Status: DC
  Administered 2014-05-06 – 2014-05-09 (×7): 3 mL via INTRAVENOUS

## 2014-05-05 MED ORDER — ASPIRIN 81 MG PO TABS: 81.0000 mg | ORAL_TABLET | Freq: Every day | ORAL | Status: DC

## 2014-05-05 MED ORDER — TAMSULOSIN HCL 0.4 MG PO CAPS
0.4000 mg | ORAL_CAPSULE | Freq: Every day | ORAL | Status: DC
  Administered 2014-05-05 – 2014-05-09 (×5): 0.4 mg via ORAL
  Filled 2014-05-05 (×5): qty 1

## 2014-05-05 MED ORDER — VANCOMYCIN HCL IN DEXTROSE 750-5 MG/150ML-% IV SOLN
750.0000 mg | Freq: Two times a day (BID) | INTRAVENOUS | Status: DC
  Administered 2014-05-06 – 2014-05-07 (×4): 750 mg via INTRAVENOUS
  Filled 2014-05-05 (×5): qty 150

## 2014-05-05 MED ORDER — VITAMIN B-12 1000 MCG PO TABS
1000.0000 ug | ORAL_TABLET | Freq: Every day | ORAL | Status: DC
  Administered 2014-05-05 – 2014-05-09 (×5): 1000 ug via ORAL
  Filled 2014-05-05 (×5): qty 1

## 2014-05-05 MED ORDER — ACETAMINOPHEN 650 MG RE SUPP: 650.0000 mg | Freq: Four times a day (QID) | RECTAL | Status: DC | PRN

## 2014-05-05 MED ORDER — CARBIDOPA-LEVODOPA 25-100 MG PO TABS
2.0000 | ORAL_TABLET | Freq: Four times a day (QID) | ORAL | Status: DC
  Administered 2014-05-06 – 2014-05-09 (×15): 2 via ORAL
  Filled 2014-05-05 (×17): qty 2

## 2014-05-05 MED ORDER — ENOXAPARIN SODIUM 40 MG/0.4ML ~~LOC~~ SOLN
40.0000 mg | SUBCUTANEOUS | Status: DC
  Administered 2014-05-06 (×2): 40 mg via SUBCUTANEOUS
  Filled 2014-05-05 (×5): qty 0.4

## 2014-05-05 MED ORDER — PIPERACILLIN-TAZOBACTAM 3.375 G IVPB
3.3750 g | Freq: Once | INTRAVENOUS | Status: AC
  Administered 2014-05-05: 3.375 g via INTRAVENOUS
  Filled 2014-05-05: qty 50

## 2014-05-05 MED ORDER — DOCUSATE SODIUM 100 MG PO CAPS
100.0000 mg | ORAL_CAPSULE | Freq: Two times a day (BID) | ORAL | Status: DC
  Administered 2014-05-06 – 2014-05-08 (×6): 100 mg via ORAL
  Filled 2014-05-05 (×10): qty 1

## 2014-05-05 MED ORDER — SODIUM CHLORIDE 0.9 % IV SOLN
1000.0000 mL | Freq: Once | INTRAVENOUS | Status: AC
  Administered 2014-05-05: 1000 mL via INTRAVENOUS

## 2014-05-05 MED ORDER — ESCITALOPRAM OXALATE 10 MG PO TABS
10.0000 mg | ORAL_TABLET | Freq: Every day | ORAL | Status: DC
  Administered 2014-05-05 – 2014-05-09 (×5): 10 mg via ORAL
  Filled 2014-05-05 (×5): qty 1

## 2014-05-05 NOTE — ED Notes (Signed)
Portable xray called, will come shortly.

## 2014-05-05 NOTE — ED Notes (Signed)
Per EMS - pt coming from home, hx of advanced parkinson's. Pt just had sx on his tooth, wife reports normally he is very out of it for a few days after sx. Today when she went to help him up to the bathroom, he was unable to move his legs as normal, attempted again but pt still unable to move legs. Pt able to follow commands. Pt has difficulty speaking. Normally pt is able to walk some with assistance. 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 Pt has a neuro-stimulator to left upper chest, there is redness and swelling around this area. Pt had some chills this morning, didn't check a temp. CBG 125. HR 70s, BP 130/70. 20 G in left arm.

## 2014-05-05 NOTE — ED Notes (Signed)
[name] at bedside - pt had tooth pulled on Wednesday, yesterday afternoon she noticed that he was becoming more confused and isn't following commands as much as he normally does. sts usually after a procedure he had something similar and gives him Seroquel and that helps but it didn't help this time. sts he is also more tired than normal.

## 2014-05-05 NOTE — H&P (Signed)
[facility] History and Physical  [name] OZH:[id number] DOB: 1941/04/08 DOA: 05/05/2014  Referring physician: EDP PCP: Neena Rhymes, MD   Chief Complaint:  Fevers and confusion  HPI: [name] is a 73 y.o. male past medical history significant for Parkinson's disease status post deep brain stimulator>> followed by Dr. Beaulah Corin at [facility], hypertension, CAD, dementia who presents with above complaints. The history is obtained from wife at bedside with patient contributing. She reports that he's had chills since yesterday, with generalized weakness and a little more confused than baseline. Also the report that he has been coughing with meals. It is also noted that he had a tooth extraction 2 days ago per his dentist. His wife also relates that about a month ago at the noted some redness in his left upper chest area>> the site of the deep brain stimulator, and he was also complaining of increased pain there. She states and she took him to [facility] and was seen by Dr. Allene Pyo PA and at the time they were told about that it was not infected. Per wife the redness has remained unchanged and patient continues to have pain in that area. He was seen in the ED temperature 101.7, WBC 7.8. Urinalysis was negative for infection, and chest x-ray showed no acute infiltrates. Dr. Adonis Brook discussed patient with Dr. Angelyn Punt at [facility] and he stated that patient was able to stay at [facility]. Blood and urine cultures were obtained in EDP, and he was started on empiric Vanc & Zosyn and is admitted for further evaluation and management. Lactic acid level was normal at 1.03, and is hemodynamically stable.    Review of Systems The patient denies anorexia, fever, weight loss,, vision loss, decreased hearing, hoarseness, chest pain, syncope, dyspnea on exertion, peripheral edema, balance deficits, hemoptysis, abdominal pain, melena, hematochezia, severe indigestion/heartburn, hematuria,  incontinence, genital sores, muscle weakness, suspicious skin lesions, transient blindness, difficulty walking, depression, unusual weight change, abnormal bleeding, enlarged lymph nodes, angioedema, and breast masses.   Past Medical History  Diagnosis Date  . Parkinson disease   . Bradycardia     syncope due to prolonged pauses, resolved with PPM  . Sleep apnea   . Hypertension   . Dermatitis   . Depression   . Rib fractures 2011    S/p fall  . Maxillary fracture 2009    Right  . BPH (benign prostatic hypertrophy)   . Coronary artery disease 1986    Inferior wall MI treated with Angioplasty  . Normal echocardiogram 2007    Mild LVH with Impaired relaxation, Mild-Mod. Aortic Root dilation, Mild Aortic Sclerosis, Mild MR, Mild TR, Ef-50-55%  . Normal nuclear stress test 2006    Normal EF-54%  . CAD (coronary artery disease)   . Inferior MI   . 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 Maxillary fracture 2009    Right side  . Rib fracture 07/2010  . Atrioventricular block, complete    Past Surgical History  Procedure Laterality Date  . Pacemaker insertion  10/28/10    MDT implanted by Dr Ladona Ridgel  . Deep brain stimulator placement      Parkinsons  . Arthroscopic knee      Left knee  . Cataract surgery      Bilateral  . Inguinal hernia repair      Bilateral  . Cardiac catheterization      Multiple Angioplasties, last 2002  . Removal of ganglion cyst      right wrist  .  Insertion / placement / revision neurostimulator     Social History:  reports that he has never smoked. He does not have any smokeless tobacco history on file. He reports that he does not drink alcohol or use illicit drugs.  Allergies  Allergen Reactions  . Crestor [Rosuvastatin Calcium] Other (See Comments)    Muscle weakness   . Bactrim Rash  . Gluten Rash  . Sulfamethoxazole-Trimethoprim Rash    Family History  Problem Relation Age of Onset  . Heart attack Mother   . Heart disease Mother    . Hypertension Mother   . Heart attack Father   . Other Father     renal calculi     Prior to Admission medications   Medication Sig Start Date End Date Taking? Authorizing Provider  aspirin 81 MG tablet Take 81 mg by mouth daily.     Yes Historical Provider, MD  CALCIUM-VITAMIN D PO Take 1 tablet by mouth daily. 400mg  of calcium and 1000units of D3   Yes Historical Provider, MD  carbidopa-levodopa (SINEMET) 25-100 MG per tablet Take 1 tablet by mouth at bedtime.     Yes Historical Provider, MD  Carbidopa-Levodopa-Entacapone (STALEVO 200 PO) Take 200 mg by mouth 4 (four) times daily.     Yes Historical Provider, MD  dapsone 25 MG tablet Take 25 mg by mouth 2 (two) times daily.     Yes Historical Provider, MD  donepezil (ARICEPT) 10 MG tablet Take 10 mg by mouth daily.     Yes Historical Provider, MD  escitalopram (LEXAPRO) 10 MG tablet Take 10 mg by mouth daily.     Yes Historical Provider, MD  ezetimibe (ZETIA) 10 MG tablet Take 10 mg by mouth daily.   Yes Historical Provider, MD  Magnesium 400 MG CAPS Take 400 mg by mouth daily.    Yes Historical Provider, MD  mirabegron ER (MYRBETRIQ) 50 MG TB24 tablet Take 50 mg by mouth daily.   Yes Historical Provider, MD  nitroGLYCERIN (NITROSTAT) 0.4 MG SL tablet Place 0.4 mg under the tongue every 5 (five) minutes as needed for chest pain.   Yes Historical Provider, MD  QUEtiapine (SEROQUEL) 25 MG tablet Take 25 mg by mouth at bedtime.   Yes Historical Provider, MD  Tamsulosin HCl (FLOMAX) 0.4 MG CAPS Take 0.4 mg by mouth daily.    Yes Historical Provider, MD  vitamin B-12 (CYANOCOBALAMIN) 1000 MCG tablet Take 1,000 mcg by mouth daily.   Yes Historical Provider, MD   Physical Exam: Filed Vitals:   05/05/14 1806  BP: 129/73  Pulse: 63  Temp: 98.1 F (36.7 C)  Resp: 16    BP 129/73  Pulse 63  Temp(Src) 98.1 F (36.7 C) (Oral)  Resp 16  Wt 78.019 kg (172 lb)  SpO2 97% Constitutional: Vital signs reviewed.  Patient is a well-developed  and well-nourished in no acute distress and cooperative with exam. Alert and oriented x1.  Head: Normocephalic and atraumatic Mouth: Right lower site of tooth extraction with old clot, whitish debris present, MMM Eyes: PERRL, EOMI, conjunctivae normal, No scleral icterus.  Neck: Supple, Trachea midline normal ROM, No JVD, mass, thyromegaly, or carotid bruit present.  Cardiovascular: RRR, S1 normal, S2 normal, no MRG, pulses symmetric and intact bilaterally Pulmonary/Chest: normal respiratory effort, CTAB, no wheezes, rales, or rhonchi. Left upper chest with erythema over the stimulator pocket, there is a thinned bluish-appearing area over the lead going up from the pocket, mild tenderness present, no drainage. Abdominal: Soft. Non-tender, non-distended,  bowel sounds are normal, no masses, organomegaly, or guarding present.  GU: no CVA tenderness  extremities: No cyanosis and no edema  Neurological: A&O x1, cranial nerve II-XII are grossly intact, no focal motor deficit, sensory intact to light touch bilaterally.  Skin: Warm, dry and intact. No rash, cyanosis, or clubbing.  Psychiatric: Normal mood and affect. speech and behavior is normal.               Labs on Admission:  Basic Metabolic Panel:  Recent Labs Lab 05/05/14 1408 05/05/14 1506  NA 134* 136*  K 4.3 4.1  CL 97 98  CO2 25  --   GLUCOSE 113* 109*  BUN 13 13  CREATININE 0.78 0.90  CALCIUM 9.1  --    Liver Function Tests:  Recent Labs Lab 05/05/14 1408  AST 27  ALT 13  ALKPHOS 95  BILITOT 0.8  PROT 7.1  ALBUMIN 3.8   No results found for this basename: LIPASE, AMYLASE,  in the last 168 hours No results found for this basename: AMMONIA,  in the last 168 hours CBC:  Recent Labs Lab 05/05/14 1408 05/05/14 1506  WBC 7.8  --   NEUTROABS 6.0  --   HGB 13.1 14.6  HCT 41.1 43.0  MCV 87.4  --   PLT 289  --    Cardiac Enzymes:  Recent Labs Lab 05/05/14 1724  TROPONINI <0.30    BNP (last 3  results) No results found for this basename: PROBNP,  in the last 8760 hours CBG: No results found for this basename: GLUCAP,  in the last 168 hours  Radiological Exams on Admission: Ct Head Wo Contrast  05/05/2014   CLINICAL DATA:  Recent tooth extraction, altered mental status.  EXAM: CT HEAD WITHOUT CONTRAST  TECHNIQUE: Contiguous axial images were obtained from the base of the skull through the vertex without intravenous contrast.  COMPARISON:  CT of the head November 11, 2010  FINDINGS: No intraparenchymal hemorrhage, mass effect, midline shift nor acute large vascular territory infarcts. Mild ventriculomegaly, likely on the basis of global parenchymal brain volume loss as there is commensurate enlargement of cerebral sulci and cerebellar folia, unchanged and within normal range for patient's age. Bilateral deep brain stimulators via high bifrontal burr holes, unchanged in position in the region of the substantia nigra. Gliosis about the lead tracts.  No abnormal extra-axial fluid collections. Basal cisterns are patent.  Bilateral ocular lens implants, visualized ocular globes and orbital contents are unremarkable. Mild left maxillary bilateral ethmoid mucosal thickening without paranasal sinus air-fluid levels. Left sphenoid mucosal retention cysts, present previously. Visualized mastoid air cells are well aerated. No skull fracture.  IMPRESSION: No acute intracranial process.  Bilateral DBS in situ.  Worsening paranasal sinusitis.   Electronically Signed   By: Awilda Metro   On: 05/05/2014 16:03   Dg Chest Port 1 View  (if Code Sepsis Called)  05/05/2014   CLINICAL DATA:  Weakness.  EXAM: PORTABLE CHEST - 1 VIEW  COMPARISON:  Mild cardiomegaly, no acute pulmonary process.  FINDINGS: Cardiac silhouette appears mildly enlarged, even with consideration to this low inspiratory portable examination carotid vasculature markings. Mediastinal silhouette is nonsuspicious. No pleural effusions or focal  consolidations. No pneumothorax. Implantable electronic device in left chest may reflect vagal stimulator. Dual lead cardiac pacemaker right chest, unchanged. No pneumothorax.  Multiple EKG lines overlie the patient and may obscure subtle underlying pathology. Lumbar scoliosis partially imaged. Soft tissue planes are nonsuspicious. Remote right posterior third and fourth  rib fractures.  IMPRESSION: Mild cardiomegaly, no acute pulmonary process.   Electronically Signed   By: Awilda Metro   On: 05/05/2014 15:25      Assessment/Plan Active Problems:   Fever -Unclear source>> dental infection vs possible DBS pocket cellulitis vs early aspiration pneumonia -Blood and urine cultures, continue empiric antibiotics with vancomycin and Zosyn -Obtain panorex>> to further evaluate possible abscess -Follow up on studies and further treat accordingly Acute encephalopathy in patient with dementia -Likely secondary to above -Treat as above, continue Seroquel and follow   Atrial fibrillation-status post pacemaker -Rate controlled, continue outpatient medications -Continue aspirin   CAD (coronary artery disease) -He is chest pain-free, continue outpatient medications   Dementia -Continue Seroquel   Idiopathic Parkinson's disease -Continue outpatient medications      Code Status: Full Family Communication: Wife at bedside Disposition Plan: Admit to telemetry  Time spent:>30mins  Kela Millin [facility] Pager [phone]

## 2014-05-05 NOTE — Progress Notes (Signed)
ANTIBIOTIC CONSULT NOTE - INITIAL  Pharmacy Consult for Vancomycin, Zosyn Indication: Cellulitis   Allergies  Allergen Reactions  . Crestor [Rosuvastatin Calcium] Other (See Comments)    Muscle weakness   . Bactrim Rash  . Gluten Rash  . Sulfamethoxazole-Trimethoprim Rash    Patient Measurements: Weight: 172 lb (78.019 kg) Adjusted Body Weight: n/a  Vital Signs: Temp: 98.1 F (36.7 C) (06/26 1806) Temp src: Oral (06/26 1806) BP: 129/73 mmHg (06/26 1806) Pulse Rate: 63 (06/26 1806) Intake/Output from previous day:   Intake/Output from this shift:    Labs:  Recent Labs  05/05/14 1408 05/05/14 1506  WBC 7.8  --   HGB 13.1 14.6  PLT 289  --   CREATININE 0.78 0.90   The CrCl is unknown because both a height and weight (above a minimum accepted value) are required for this calculation. No results found for this basename: VANCOTROUGH, Leodis BinetVANCOPEAK, VANCORANDOM, GENTTROUGH, GENTPEAK, GENTRANDOM, TOBRATROUGH, TOBRAPEAK, TOBRARND, AMIKACINPEAK, AMIKACINTROU, AMIKACIN,  in the last 72 hours   Microbiology: Recent Results (from the past 720 hour(s))  FECAL OCCULT BLOOD, IMMUNOCHEMICAL     Status: None   Collection Time    05/04/14 11:17 AM      Result Value Ref Range Status   Fecal Occult Bld Negative  Negative Final    Medical History: Past Medical History  Diagnosis Date  . Parkinson disease   . Bradycardia     syncope due to prolonged pauses, resolved with PPM  . Sleep apnea   . Hypertension   . Dermatitis   . Depression   . Rib fractures 2011    S/p fall  . Maxillary fracture 2009    Right  . BPH (benign prostatic hypertrophy)   . Coronary artery disease 1986    Inferior wall MI treated with Angioplasty  . Normal echocardiogram 2007    Mild LVH with Impaired relaxation, Mild-Mod. Aortic Root dilation, Mild Aortic Sclerosis, Mild MR, Mild TR, Ef-50-55%  . Normal nuclear stress test 2006    Normal EF-54%  . CAD (coronary artery disease)   . Inferior MI    . 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 Maxillary fracture 2009    Right side  . Rib fracture 07/2010  . Atrioventricular block, complete     Medications:  Prescriptions prior to admission  Medication Sig Dispense Refill  . aspirin 81 MG tablet Take 81 mg by mouth daily.        Marland Kitchen. CALCIUM-VITAMIN D PO Take 1 tablet by mouth daily. 400mg  of calcium and 1000units of D3      . carbidopa-levodopa (SINEMET) 25-100 MG per tablet Take 1 tablet by mouth at bedtime.        . Carbidopa-Levodopa-Entacapone (STALEVO 200 PO) Take 200 mg by mouth 4 (four) times daily.        . dapsone 25 MG tablet Take 25 mg by mouth 2 (two) times daily.        Marland Kitchen. donepezil (ARICEPT) 10 MG tablet Take 10 mg by mouth daily.        Marland Kitchen. escitalopram (LEXAPRO) 10 MG tablet Take 10 mg by mouth daily.        Marland Kitchen. ezetimibe (ZETIA) 10 MG tablet Take 10 mg by mouth daily.      . Magnesium 400 MG CAPS Take 400 mg by mouth daily.       . mirabegron ER (MYRBETRIQ) 50 MG TB24 tablet Take 50 mg by mouth daily.      .Marland Kitchen  nitroGLYCERIN (NITROSTAT) 0.4 MG SL tablet Place 0.4 mg under the tongue every 5 (five) minutes as needed for chest pain.      Marland Kitchen QUEtiapine (SEROQUEL) 25 MG tablet Take 25 mg by mouth at bedtime.      . Tamsulosin HCl (FLOMAX) 0.4 MG CAPS Take 0.4 mg by mouth daily.       . vitamin B-12 (CYANOCOBALAMIN) 1000 MCG tablet Take 1,000 mcg by mouth daily.       Assessment: 28 YOM who presented to the ED with fevers and confusion. He also has some redness in the upper chest area (the site of the deep brain simulator) with complaints of pain. Pharmacy consulted to start empiric antibiotics for suspected cellulitis. WBC wnl. Max temp in ED 101.7. LA wnl. CrCl ~ 66 mL/min. Patient has already received one dose of Vancomycin and Zosyn in the ED.   Cultures: 6/26 Blood Cx x2>> 6/26 Urine Cx >>   Goal of Therapy:  Vancomycin trough level 10-15 mcg/ml Resolution of infection  Plan:  1) Vancomycin 750 mg IV Q 12 hours  2)  Zosyn 3.375 gm IV Q 8 hours (extended interval) 3) Monitor CBC, renal fx, cultures and patient's clinical progress 4) Vancomycin trough at steady state.   Vinnie Level, PharmD.  Clinical Pharmacist Pager [phone]

## 2014-05-05 NOTE — ED Provider Notes (Signed)
CSN: [id number]     Arrival date & time 05/05/14  1341 History   First MD Initiated Contact with Patient 05/05/14 1355     Chief Complaint  Patient presents with  . Weakness     (Consider location/radiation/quality/duration/timing/severity/associated sxs/prior Treatment) HPI Comments: Patient from home with change in mental status and fever. Wife states more confused than usual and unable to walk as usual. Patient has history of Parkinson's disease. He recently had a tooth extraction 2 days ago and wife states he sometimes has mental status change after minor procedures like this. He normal has improvement with seroquel but did not today.Marland Kitchen. He has had decreased by mouth intake at home. He's had chills this morning but did not check her temperature. Is 101 ED arrival. Denies any dysuria hematuria. Denies any cough. No abdominal pain nausea or vomiting. Patient has erythema along the pocket of his deep brain stimulator.  The history is provided by the patient and the EMS personnel. The history is limited by the condition of the patient.    Past Medical History  Diagnosis Date  . Parkinson disease   . Bradycardia     syncope due to prolonged pauses, resolved with PPM  . Sleep apnea   . Hypertension   . Dermatitis   . Depression   . Rib fractures 2011    S/p fall  . Maxillary fracture 2009    Right  . BPH (benign prostatic hypertrophy)   . Coronary artery disease 1986    Inferior wall MI treated with Angioplasty  . Normal echocardiogram 2007    Mild LVH with Impaired relaxation, Mild-Mod. Aortic Root dilation, Mild Aortic Sclerosis, Mild MR, Mild TR, Ef-50-55%  . Normal nuclear stress test 2006    Normal EF-54%  . CAD (coronary artery disease)   . Inferior MI   . 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 Maxillary fracture 2009    Right side  . Rib fracture 07/2010  . Atrioventricular block, complete    Past Surgical History  Procedure Laterality Date  . Pacemaker insertion   10/28/10    MDT implanted by Dr Ladona Ridgelaylor  . Deep brain stimulator placement      Parkinsons  . Arthroscopic knee      Left knee  . Cataract surgery      Bilateral  . Inguinal hernia repair      Bilateral  . Cardiac catheterization      Multiple Angioplasties, last 2002  . Removal of ganglion cyst      right wrist  . Insertion / placement / revision neurostimulator     Family History  Problem Relation Age of Onset  . Heart attack Mother   . Heart disease Mother   . Hypertension Mother   . Heart attack Father   . Other Father     renal calculi   History  Substance Use Topics  . Smoking status: Never Smoker   . Smokeless tobacco: Not on file  . Alcohol Use: No    Review of Systems  Unable to perform ROS: Mental status change  Neurological: Positive for weakness.      Allergies  Crestor; Bactrim; Gluten; and Sulfamethoxazole-trimethoprim  Home Medications   Prior to Admission medications   Medication Sig Start Date End Date Taking? Authorizing Lailee Hoelzel  aspirin 81 MG tablet Take 81 mg by mouth daily.     Yes Historical Kirsta Probert, MD  CALCIUM-VITAMIN D PO Take 1 tablet by mouth daily. 400mg  of  calcium and 1000units of D3   Yes Historical Tamani Durney, MD  carbidopa-levodopa (SINEMET) 25-100 MG per tablet Take 1 tablet by mouth at bedtime.     Yes Historical Kada Friesen, MD  Carbidopa-Levodopa-Entacapone (STALEVO 200 PO) Take 200 mg by mouth 4 (four) times daily.     Yes Historical Fountain Derusha, MD  dapsone 25 MG tablet Take 25 mg by mouth 2 (two) times daily.     Yes Historical Tika Hannis, MD  donepezil (ARICEPT) 10 MG tablet Take 10 mg by mouth daily.     Yes Historical Cammi Consalvo, MD  escitalopram (LEXAPRO) 10 MG tablet Take 10 mg by mouth daily.     Yes Historical Astin Sayre, MD  ezetimibe (ZETIA) 10 MG tablet Take 10 mg by mouth daily.   Yes Historical Natausha Jungwirth, MD  Magnesium 400 MG CAPS Take 400 mg by mouth daily.    Yes Historical Nechama Escutia, MD  mirabegron ER (MYRBETRIQ) 50 MG TB24  tablet Take 50 mg by mouth daily.   Yes Historical Dahlila Pfahler, MD  nitroGLYCERIN (NITROSTAT) 0.4 MG SL tablet Place 0.4 mg under the tongue every 5 (five) minutes as needed for chest pain.   Yes Historical Areon Cocuzza, MD  QUEtiapine (SEROQUEL) 25 MG tablet Take 25 mg by mouth at bedtime.   Yes Historical Hagen Bohorquez, MD  Tamsulosin HCl (FLOMAX) 0.4 MG CAPS Take 0.4 mg by mouth daily.    Yes Historical Baelyn Doring, MD  vitamin B-12 (CYANOCOBALAMIN) 1000 MCG tablet Take 1,000 mcg by mouth daily.   Yes Historical Stephan Nelis, MD   BP 104/57  Pulse 58  Temp(Src) 98.9 F (37.2 C) (Oral)  Resp 15  SpO2 95% Physical Exam  Nursing note and vitals reviewed. Constitutional: He is oriented to person, place, and time. He appears well-developed and well-nourished. No distress.  HENT:  Head: Normocephalic and atraumatic.  Mouth/Throat: Oropharynx is clear and moist. No oropharyngeal exudate.  R lower incisor with blood clot, no active bleeding, no abscess  Eyes: Conjunctivae and EOM are normal. Pupils are equal, round, and reactive to light.  Neck: Normal range of motion. Neck supple.  No meningismus.  Cardiovascular: Normal rate, regular rhythm, normal heart sounds and intact distal pulses.   No murmur heard. Pulmonary/Chest: Effort normal and breath sounds normal. No respiratory distress. He exhibits no tenderness.  DBS L chest with minimal erythema, no fluctuance, nontender  Abdominal: Soft. There is no tenderness. There is no rebound and no guarding.  Musculoskeletal: Normal range of motion. He exhibits no edema and no tenderness.  Neurological: He is alert and oriented to person, place, and time. No cranial nerve deficit. He exhibits normal muscle tone. Coordination normal.  Equal strength throughout, follows commands. CN 2-12 intact Contrary to nursing note, moving legs well  Skin: Skin is warm.  Psychiatric: He has a normal mood and affect. His behavior is normal.    ED Course  Procedures (including  critical care time) Labs Review Labs Reviewed  URINALYSIS, ROUTINE W REFLEX MICROSCOPIC - Abnormal; Notable for the following:    Color, Urine AMBER (*)    pH 8.5 (*)    Ketones, ur 15 (*)    Protein, ur 30 (*)    All other components within normal limits  CBC WITH DIFFERENTIAL - Abnormal; Notable for the following:    Lymphocytes Relative 11 (*)    All other components within normal limits  COMPREHENSIVE METABOLIC PANEL - Abnormal; Notable for the following:    Sodium 134 (*)    Glucose, Bld 113 (*)  GFR calc non Af Amer 86 (*)    All other components within normal limits  I-STAT CHEM 8, ED - Abnormal; Notable for the following:    Sodium 136 (*)    Glucose, Bld 109 (*)    All other components within normal limits  CULTURE, BLOOD (ROUTINE X 2)  CULTURE, BLOOD (ROUTINE X 2)  URINE CULTURE  URINE MICROSCOPIC-ADD ON  TROPONIN I  PROTIME-INR  I-STAT CG4 LACTIC ACID, ED  I-STAT CG4 LACTIC ACID, ED    Imaging Review Ct Head Wo Contrast  05/05/2014   CLINICAL DATA:  Recent tooth extraction, altered mental status.  EXAM: CT HEAD WITHOUT CONTRAST  TECHNIQUE: Contiguous axial images were obtained from the base of the skull through the vertex without intravenous contrast.  COMPARISON:  CT of the head November 11, 2010  FINDINGS: No intraparenchymal hemorrhage, mass effect, midline shift nor acute large vascular territory infarcts. Mild ventriculomegaly, likely on the basis of global parenchymal brain volume loss as there is commensurate enlargement of cerebral sulci and cerebellar folia, unchanged and within normal range for patient's age. Bilateral deep brain stimulators via high bifrontal burr holes, unchanged in position in the region of the substantia nigra. Gliosis about the lead tracts.  No abnormal extra-axial fluid collections. Basal cisterns are patent.  Bilateral ocular lens implants, visualized ocular globes and orbital contents are unremarkable. Mild left maxillary bilateral  ethmoid mucosal thickening without paranasal sinus air-fluid levels. Left sphenoid mucosal retention cysts, present previously. Visualized mastoid air cells are well aerated. No skull fracture.  IMPRESSION: No acute intracranial process.  Bilateral DBS in situ.  Worsening paranasal sinusitis.   Electronically Signed   By: Awilda Metro   On: 05/05/2014 16:03   Dg Chest Port 1 View  (if Code Sepsis Called)  05/05/2014   CLINICAL DATA:  Weakness.  EXAM: PORTABLE CHEST - 1 VIEW  COMPARISON:  Mild cardiomegaly, no acute pulmonary process.  FINDINGS: Cardiac silhouette appears mildly enlarged, even with consideration to this low inspiratory portable examination carotid vasculature markings. Mediastinal silhouette is nonsuspicious. No pleural effusions or focal consolidations. No pneumothorax. Implantable electronic device in left chest may reflect vagal stimulator. Dual lead cardiac pacemaker right chest, unchanged. No pneumothorax.  Multiple EKG lines overlie the patient and may obscure subtle underlying pathology. Lumbar scoliosis partially imaged. Soft tissue planes are nonsuspicious. Remote right posterior third and fourth rib fractures.  IMPRESSION: Mild cardiomegaly, no acute pulmonary process.   Electronically Signed   By: Awilda Metro   On: 05/05/2014 15:25     EKG Interpretation   Date/Time:  Friday May 05 2014 13:57:15 EDT Ventricular Rate:  76 PR Interval:  215 QRS Duration: 162 QT Interval:  396 QTC Calculation: 445 R Axis:   -70 Text Interpretation:  paced Left bundle branch block Probable RV  involvement, suggest recording right precordial leads No significant  change was found Confirmed by Manus Gunning  MD, STEPHEN [phone]) on 05/05/2014  3:24:20 PM      MDM   Final diagnoses:  Fever, unspecified fever cause  Hypoxia   Fever with recent tooth extraction, increasing confusion and decreased PO intake.  Patient in no distress, denies pain.  EKG paced.    CXR without  infiltrate. UA negative.  Neck supple.  Patient denies headache or neck pain. Doubt meningitis. Mild erythema to site of DBS in chest. Does not appear to be cellulitis.  Wife states looks about the same as usual.  D/w patient's neurosurgeon at [facility] Dr. Angelyn Punt.  He agrees most likely not cellulitis and patient stable to stay at [facility].  Suspection probable aspiration given hypoxia on arrival with fever and history of coughing while eating.  Antibiotics started after cultures obtained.  O2 saturation stable on [place].  Fever improved.  Admission d/w Dr. Donna Bernard.  BP 129/73  Pulse 63  Temp(Src) 98.1 F (36.7 C) (Oral)  Resp 16  Wt 172 lb (78.019 kg)  SpO2 97%    Glynn Octave, MD 05/05/14 1810

## 2014-05-05 NOTE — ED Notes (Signed)
[name] sts she is also concerned that the pt may have a UTI.

## 2014-05-06 ENCOUNTER — Inpatient Hospital Stay (HOSPITAL_COMMUNITY): Payer: Medicare Other

## 2014-05-06 LAB — CBC
HCT: 36.4 % — ABNORMAL LOW (ref 39.0–52.0)
Hemoglobin: 11.7 g/dL — ABNORMAL LOW (ref 13.0–17.0)
MCH: 28.2 pg (ref 26.0–34.0)
MCHC: 32.1 g/dL (ref 30.0–36.0)
MCV: 87.7 fL (ref 78.0–100.0)
PLATELETS: 252 10*3/uL (ref 150–400)
RBC: 4.15 MIL/uL — AB (ref 4.22–5.81)
RDW: 14.5 % (ref 11.5–15.5)
WBC: 3.7 10*3/uL — ABNORMAL LOW (ref 4.0–10.5)

## 2014-05-06 LAB — BASIC METABOLIC PANEL
BUN: 13 mg/dL (ref 6–23)
CHLORIDE: 102 meq/L (ref 96–112)
CO2: 24 meq/L (ref 19–32)
Calcium: 8.1 mg/dL — ABNORMAL LOW (ref 8.4–10.5)
Creatinine, Ser: 0.76 mg/dL (ref 0.50–1.35)
GFR calc non Af Amer: 87 mL/min — ABNORMAL LOW (ref >90)
Glucose, Bld: 95 mg/dL (ref 70–99)
POTASSIUM: 4.1 meq/L (ref 3.7–5.3)
SODIUM: 137 meq/L (ref 137–147)

## 2014-05-06 NOTE — Progress Notes (Signed)
[facility] PROGRESS NOTE  [name] ZOX:[id number] DOB: 04/18/1941 DOA: 05/05/2014 PCP: Neena RhymesKatherine Tabori, MD  Assessment/Plan: Active Problems:  Fever/Probable PNA  - dental infection vs possible DBS pocket cellulitis vs early aspiration pneumonia  -repeat cxr with possible Left airspace disease>>continue current abx -Blood and urine cultures pending continue empiric antibiotics with vancomycin and Zosyn  -await panorex results>> to further evaluate possible abscess  -erythema/possible DBS pocket cellulitis resolving with abx Acute encephalopathy in patient with dementia  -Likely secondary to above  -improving withTreatment as above, continue Seroquel and follow  Atrial fibrillation-status post pacemaker  -Rate controlled, continue outpatient medications  -Continue aspirin  CAD (coronary artery disease)  -He is chest pain-free, continue outpatient medications  Dementia  -Continue Seroquel  Idiopathic Parkinson's disease  -Continue outpatient medications -consult PT/OT  Code Status: full Family Communication: wife at bedside Disposition Plan: to home when medically ready   Consultants:  none  Procedures:  none  Antibiotics:  Vanc & Zosyn started 6/26  HPI/Subjective: Pt seen in radiology suite, awake and orientes x3 today, states feeling better  Objective: Filed Vitals:   05/06/14 1500  BP: 111/71  Pulse: 63  Temp: 98 F (36.7 C)  Resp: 16    Intake/Output Summary (Last 24 hours) at 05/06/14 1738 Last data filed at 05/06/14 1500  Gross per 24 hour  Intake 1276.67 ml  Output   1000 ml  Net 276.67 ml   Filed Weights   05/05/14 1806  Weight: 78.019 kg (172 lb)    Exam:  General: Awake & oriented x 3 In NAD Cardiovascular: RRR, nl S1 s2; decrfeased erythema over DBS in L. Chest wall Respiratory: decreased BS at bases Abdomen: soft +BS NT/ND, no masses palpable Extremities: No cyanosis and no edema    Data Reviewed: Basic Metabolic  Panel:  Recent Labs Lab 05/05/14 1408 05/05/14 1506 05/06/14 0440  NA 134* 136* 137  K 4.3 4.1 4.1  CL 97 98 102  CO2 25  --  24  GLUCOSE 113* 109* 95  BUN 13 13 13   CREATININE 0.78 0.90 0.76  CALCIUM 9.1  --  8.1*   Liver Function Tests:  Recent Labs Lab 05/05/14 1408  AST 27  ALT 13  ALKPHOS 95  BILITOT 0.8  PROT 7.1  ALBUMIN 3.8   No results found for this basename: LIPASE, AMYLASE,  in the last 168 hours No results found for this basename: AMMONIA,  in the last 168 hours CBC:  Recent Labs Lab 05/05/14 1408 05/05/14 1506 05/06/14 0440  WBC 7.8  --  3.7*  NEUTROABS 6.0  --   --   HGB 13.1 14.6 11.7*  HCT 41.1 43.0 36.4*  MCV 87.4  --  87.7  PLT 289  --  252   Cardiac Enzymes:  Recent Labs Lab 05/05/14 1724  TROPONINI <0.30   BNP (last 3 results) No results found for this basename: PROBNP,  in the last 8760 hours CBG: No results found for this basename: GLUCAP,  in the last 168 hours  Recent Results (from the past 240 hour(s))  FECAL OCCULT BLOOD, IMMUNOCHEMICAL     Status: None   Collection Time    05/04/14 11:17 AM      Result Value Ref Range Status   Fecal Occult Bld Negative  Negative Final  CULTURE, BLOOD (ROUTINE X 2)     Status: None   Collection Time    05/05/14  2:34 PM      Result Value Ref  Range Status   Specimen Description BLOOD RIGHT ANTECUBITAL   Final   Special Requests BOTTLES DRAWN AEROBIC AND ANAEROBIC 5CCS   Final   Culture  Setup Time     Final   Value: 05/05/2014 18:46     Performed at [facility]   Culture     Final   Value:        BLOOD CULTURE RECEIVED NO GROWTH TO DATE CULTURE WILL BE HELD FOR 5 DAYS BEFORE ISSUING A FINAL NEGATIVE REPORT     Performed at [facility]   Report Status PENDING   Incomplete  CULTURE, BLOOD (ROUTINE X 2)     Status: None   Collection Time    05/05/14  2:34 PM      Result Value Ref Range Status   Specimen Description BLOOD RIGHT HAND   Final   Special Requests  BOTTLES DRAWN AEROBIC AND ANAEROBIC 6CCS   Final   Culture  Setup Time     Final   Value: 05/05/2014 18:46     Performed at [facility]   Culture     Final   Value:        BLOOD CULTURE RECEIVED NO GROWTH TO DATE CULTURE WILL BE HELD FOR 5 DAYS BEFORE ISSUING A FINAL NEGATIVE REPORT     Performed at [facility]   Report Status PENDING   Incomplete     Studies: Dg Orthopantogram  05/06/2014   CLINICAL DATA:  73 year old male with fever, recent tooth extraction. Initial encounter.  EXAM: ORTHOPANTOGRAM/PANORAMIC  COMPARISON:  Head CT without contrast 05/05/2014.  FINDINGS: Motion artifact in the midline. Site of recent tooth extraction is unclear, possibly the right posterior bicuspid. Otherwise if it was ancisor then it may be obscured on this image. No periapical lucency identified.  IMPRESSION: Suboptimal due to motion artifact limiting visualization of the incisors and canines. Possible recent tooth extraction site at the right posterior bicuspid. Otherwise no acute findings are evident.   Electronically Signed   By: Augusto Gamble M.D.   On: 05/06/2014 16:37   Ct Head Wo Contrast  05/05/2014   CLINICAL DATA:  Recent tooth extraction, altered mental status.  EXAM: CT HEAD WITHOUT CONTRAST  TECHNIQUE: Contiguous axial images were obtained from the base of the skull through the vertex without intravenous contrast.  COMPARISON:  CT of the head November 11, 2010  FINDINGS: No intraparenchymal hemorrhage, mass effect, midline shift nor acute large vascular territory infarcts. Mild ventriculomegaly, likely on the basis of global parenchymal brain volume loss as there is commensurate enlargement of cerebral sulci and cerebellar folia, unchanged and within normal range for patient's age. Bilateral deep brain stimulators via high bifrontal burr holes, unchanged in position in the region of the substantia nigra. Gliosis about the lead tracts.  No abnormal extra-axial fluid collections. Basal  cisterns are patent.  Bilateral ocular lens implants, visualized ocular globes and orbital contents are unremarkable. Mild left maxillary bilateral ethmoid mucosal thickening without paranasal sinus air-fluid levels. Left sphenoid mucosal retention cysts, present previously. Visualized mastoid air cells are well aerated. No skull fracture.  IMPRESSION: No acute intracranial process.  Bilateral DBS in situ.  Worsening paranasal sinusitis.   Electronically Signed   By: Awilda Metro   On: 05/05/2014 16:03   Dg Chest Port 1 View  05/06/2014   CLINICAL DATA:  Cough and fever.  EXAM: PORTABLE CHEST - 1 VIEW  COMPARISON:  Chest x-ray 05/05/2014.  FINDINGS: Lung volumes are  low. Retrocardiac opacity favored to reflect some subsegmental atelectasis, although underlying airspace consolidation is difficult to exclude. No pleural effusions. No evidence of pulmonary edema. Heart size is borderline enlarged. The patient is rotated to the left on today's exam, resulting in distortion of the mediastinal contours and reduced diagnostic sensitivity and specificity for mediastinal pathology. Right-sided pacemaker in position with lead tips projecting over the right atrium and right ventricle. Left-sided electronic device with leads extending toward the head may represent a deep brain stimulator.  IMPRESSION: 1. Low lung volumes with atelectasis and consolidation in the left lower lobe.   Electronically Signed   By: Trudie Reed M.D.   On: 05/06/2014 11:20   Dg Chest Port 1 View  (if Code Sepsis Called)  05/05/2014   CLINICAL DATA:  Weakness.  EXAM: PORTABLE CHEST - 1 VIEW  COMPARISON:  Mild cardiomegaly, no acute pulmonary process.  FINDINGS: Cardiac silhouette appears mildly enlarged, even with consideration to this low inspiratory portable examination carotid vasculature markings. Mediastinal silhouette is nonsuspicious. No pleural effusions or focal consolidations. No pneumothorax. Implantable electronic device in  left chest may reflect vagal stimulator. Dual lead cardiac pacemaker right chest, unchanged. No pneumothorax.  Multiple EKG lines overlie the patient and may obscure subtle underlying pathology. Lumbar scoliosis partially imaged. Soft tissue planes are nonsuspicious. Remote right posterior third and fourth rib fractures.  IMPRESSION: Mild cardiomegaly, no acute pulmonary process.   Electronically Signed   By: Awilda Metro   On: 05/05/2014 15:25    Scheduled Meds: . antiseptic oral rinse  15 mL Mouth Rinse BID  . carbidopa-levodopa  1 tablet Oral QHS  . carbidopa-levodopa  2 tablet Oral QID  . dapsone  25 mg Oral BID  . docusate sodium  100 mg Oral BID  . donepezil  10 mg Oral Daily  . enoxaparin (LOVENOX) injection  40 mg Subcutaneous Q24H  . entacapone  200 mg Oral QID  . escitalopram  10 mg Oral Daily  . ezetimibe  10 mg Oral Daily  . magnesium oxide  400 mg Oral Daily  . mirabegron ER  50 mg Oral Daily  . piperacillin-tazobactam (ZOSYN)  IV  3.375 g Intravenous Q8H  . QUEtiapine  25 mg Oral QHS  . sodium chloride  3 mL Intravenous Q12H  . tamsulosin  0.4 mg Oral Daily  . vancomycin  750 mg Intravenous Q12H  . vitamin B-12  1,000 mcg Oral Daily   Continuous Infusions:   Active Problems:   Atrial fibrillation   CAD (coronary artery disease)   Dementia   Idiopathic Parkinson's disease   Fever    Time spent: 35    [facility]  [facility] Pager [phone]. If 7PM-7AM, please contact night-coverage at [email or web address], password [facility] 05/06/2014, 5:38 PM  LOS: 1 day

## 2014-05-06 NOTE — Evaluation (Signed)
Clinical/Bedside Swallow Evaluation Patient Details  Name: [name] MRN: [id number] Date of Birth: 1941/08/18  Today's Date: 05/06/2014 Time: 4818-5909 SLP Time Calculation (min): 20 min  Past Medical History:  Past Medical History  Diagnosis Date  . Parkinson disease   . Bradycardia     syncope due to prolonged pauses, resolved with PPM  . Sleep apnea   . Hypertension   . Dermatitis   . Depression   . Rib fractures 2011    S/p fall  . Maxillary fracture 2009    Right  . BPH (benign prostatic hypertrophy)   . Coronary artery disease 1986    Inferior wall MI treated with Angioplasty  . Normal echocardiogram 2007    Mild LVH with Impaired relaxation, Mild-Mod. Aortic Root dilation, Mild Aortic Sclerosis, Mild MR, Mild TR, Ef-50-55%  . Normal nuclear stress test 2006    Normal EF-54%  . CAD (coronary artery disease)   . Inferior MI   . 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 Maxillary fracture 2009    Right side  . Rib fracture 07/2010  . Atrioventricular block, complete   . Pacemaker   . H/O hiatal hernia    Past Surgical History:  Past Surgical History  Procedure Laterality Date  . Pacemaker insertion  10/28/10    MDT implanted by Dr Ladona Ridgel  . Deep brain stimulator placement      Parkinsons  . Arthroscopic knee      Left knee  . Cataract surgery      Bilateral  . Inguinal hernia repair      Bilateral  . Cardiac catheterization      Multiple Angioplasties, last 2002  . Removal of ganglion cyst      right wrist  . Insertion / placement / revision neurostimulator    . Eye surgery    . Insert / replace / remove pacemaker     HPI:  73 y.o. male past medical history significant for Parkinson's disease status post deep brain stimulator, seen at [facility] for most care, CAD, dementia admitted with fevers and. MD documentation states wife report of coughing with meals. Underwent tooth extraction 2 days ago.  CT 6/26 revealed mild cardiomegaly, no acute  pulmonary process.  CXR's performed at [facility] since 2011 have been negative.  Suspect swallow assessment performed at [facility] due to sign in room stating pt. is not to use straws and pt. stated "swallow test done 2 months ago was ok." (? reliability with his dementia).   Assessment / Plan / Recommendation Clinical Impression  Pt. demonstrated mild cough x 2 during observation (s/p thin liquids and grits).  Pt. is at higher aspiration risk with Parkinson's with infrequent/mild coughing expected.  All CXR's at [facility] (found on EPIC) have been negative for acute processes.  Educated pt. on behaviors to decrease risk including sitting upright, small sips, eat slowly, continue no straws.  Despite dementia pt. able to recall several of these strategies.  Recomend continue Dys 2 diet presently (predict upgrade very soon) and thin liquids.  CXR just performed; if indicates infection, may recommend MBS.  SLP will follow for CXR results and modify plan if needed.    Aspiration Risk  Moderate    Diet Recommendation Dysphagia 2 (Fine chop);Thin liquid   Liquid Administration via: Cup;No straw (per prior recommendations) Medication Administration: Whole meds with liquid (discussed possible whole in puree) Supervision: Patient able to self feed;Intermittent supervision to cue for compensatory strategies Compensations: Slow rate;Small  sips/bites Postural Changes and/or Swallow Maneuvers: Seated upright 90 degrees;Upright 30-60 min after meal;Out of bed for meals    Other  Recommendations Oral Care Recommendations: Oral care BID   Follow Up Recommendations   (TBD)    Frequency and Duration min 2x/week  2 weeks   Pertinent Vitals/Pain WDL         Swallow Study         Oral/Motor/Sensory Function Overall Oral Motor/Sensory Function: Appears within functional limits for tasks assessed   Ice Chips Ice chips: Not tested   Thin Liquid Thin Liquid: Impaired Presentation: Cup Pharyngeal  Phase  Impairments: Cough - Delayed    Nectar Thick Nectar Thick Liquid: Not tested   Honey Thick Honey Thick Liquid: Not tested   Puree Puree: Impaired Presentation: Spoon;Self Fed Pharyngeal Phase Impairments: Cough - Delayed   Solid       Solid:  (not observed (Dys 2 diet tray))       Royce MacadamiaLisa Willis Lorain Keast M.Ed [employer]-SLP Pager [phone]  05/06/2014

## 2014-05-07 DIAGNOSIS — J189 Pneumonia, unspecified organism: Secondary | ICD-10-CM

## 2014-05-07 LAB — URINE CULTURE: Colony Count: 25000

## 2014-05-07 LAB — VANCOMYCIN, TROUGH: Vancomycin Tr: 10.3 ug/mL (ref 10.0–20.0)

## 2014-05-07 LAB — GLUCOSE, CAPILLARY: Glucose-Capillary: 111 mg/dL — ABNORMAL HIGH (ref 70–99)

## 2014-05-07 MED ORDER — LORAZEPAM 2 MG/ML IJ SOLN
0.5000 mg | Freq: Once | INTRAMUSCULAR | Status: AC
  Administered 2014-05-07: 0.5 mg via INTRAVENOUS
  Filled 2014-05-07: qty 1

## 2014-05-07 MED ORDER — LORAZEPAM 0.5 MG PO TABS
0.5000 mg | ORAL_TABLET | Freq: Once | ORAL | Status: DC
  Filled 2014-05-07: qty 1

## 2014-05-07 MED ORDER — VANCOMYCIN HCL IN DEXTROSE 1-5 GM/200ML-% IV SOLN
1000.0000 mg | Freq: Two times a day (BID) | INTRAVENOUS | Status: DC
  Administered 2014-05-08 (×2): 1000 mg via INTRAVENOUS
  Filled 2014-05-07 (×3): qty 200

## 2014-05-07 NOTE — Progress Notes (Signed)
[facility] PROGRESS NOTE  [name] WUX:[id number] DOB: [birth year] DOA: 05/05/2014 PCP: Neena Rhymes, MD  Assessment/Plan: Active Problems:  Fever/Probable PNA  - early aspiration pneumonia vs possible DBS pocket cellulitis vs dental infection  -repeat cxr with possible Left airspace disease>>continue current abx -Blood and urine cultures so far with no growth continue empiric antibiotics with vancomycin and Zosyn >> follow and change to doxy in am if remains afebrile and cultures still neg. -panorex neg for infection, no reports of abscess  -erythema/possible DBS pocket cellulitis resolving with abx Acute encephalopathy in patient with dementia  -Likely secondary to above  -improving withTreatment as above, continue Seroquel and follow   Atrial fibrillation-status post pacemaker  -Rate controlled, continue outpatient medications  -Continue aspirin  CAD (coronary artery disease)  -He is chest pain-free, continue outpatient medications  Dementia  -Continue Seroquel  Idiopathic Parkinson's disease  -Continue outpatient medications -consult PT/OT  Code Status: full Family Communication: wife at bedside Disposition Plan: to home when medically ready   Consultants:  none  Procedures:  none  Antibiotics:  Vanc & Zosyn started 6/26  HPI/Subjective: Pt more talkative today, sitting on commode. Per nsg was restless last pm and required ativan prn Objective: Filed Vitals:   05/07/14 0610  BP: 132/66  Pulse: 60  Temp: 97.9 F (36.6 C)  Resp: 16    Intake/Output Summary (Last 24 hours) at 05/07/14 1349 Last data filed at 05/07/14 1236  Gross per 24 hour  Intake    570 ml  Output   4500 ml  Net  -3930 ml   Filed Weights   05/05/14 1806  Weight: 78.019 kg (172 lb)    Exam:  General: alert & oriented x 3 In NAD, intermittent involuntary mov'ts Cardiovascular: RRR, nl S1 s2; decreased erythema over DBS in L. Chest wall Respiratory: decreased BS  at bases, no wheezes Abdomen: soft +BS NT/ND, no masses palpable Extremities: No cyanosis and no edema    Data Reviewed: Basic Metabolic Panel:  Recent Labs Lab 05/05/14 1408 05/05/14 1506 05/06/14 0440  NA 134* 136* 137  K 4.3 4.1 4.1  CL 97 98 102  CO2 25  --  24  GLUCOSE 113* 109* 95  BUN 13 13 13   CREATININE 0.78 0.90 0.76  CALCIUM 9.1  --  8.1*   Liver Function Tests:  Recent Labs Lab 05/05/14 1408  AST 27  ALT 13  ALKPHOS 95  BILITOT 0.8  PROT 7.1  ALBUMIN 3.8   No results found for this basename: LIPASE, AMYLASE,  in the last 168 hours No results found for this basename: AMMONIA,  in the last 168 hours CBC:  Recent Labs Lab 05/05/14 1408 05/05/14 1506 05/06/14 0440  WBC 7.8  --  3.7*  NEUTROABS 6.0  --   --   HGB 13.1 14.6 11.7*  HCT 41.1 43.0 36.4*  MCV 87.4  --  87.7  PLT 289  --  252   Cardiac Enzymes:  Recent Labs Lab 05/05/14 1724  TROPONINI <0.30   BNP (last 3 results) No results found for this basename: PROBNP,  in the last 8760 hours CBG:  Recent Labs Lab 05/07/14 1215  GLUCAP 111*    Recent Results (from the past 240 hour(s))  FECAL OCCULT BLOOD, IMMUNOCHEMICAL     Status: None   Collection Time    05/04/14 11:17 AM      Result Value Ref Range Status   Fecal Occult Bld Negative  Negative Final  URINE  CULTURE     Status: None   Collection Time    05/05/14  2:12 PM      Result Value Ref Range Status   Specimen Description URINE, RANDOM   Final   Special Requests NONE   Final   Culture  Setup Time     Final   Value: 05/05/2014 20:52     Performed at [facility] Count PENDING   Incomplete   Culture     Final   Value: Culture reincubated for better growth     Performed at [facility]   Report Status PENDING   Incomplete  CULTURE, BLOOD (ROUTINE X 2)     Status: None   Collection Time    05/05/14  2:34 PM      Result Value Ref Range Status   Specimen Description BLOOD RIGHT ANTECUBITAL    Final   Special Requests BOTTLES DRAWN AEROBIC AND ANAEROBIC 5CCS   Final   Culture  Setup Time     Final   Value: 05/05/2014 18:46     Performed at [facility]   Culture     Final   Value:        BLOOD CULTURE RECEIVED NO GROWTH TO DATE CULTURE WILL BE HELD FOR 5 DAYS BEFORE ISSUING A FINAL NEGATIVE REPORT     Performed at [facility]   Report Status PENDING   Incomplete  CULTURE, BLOOD (ROUTINE X 2)     Status: None   Collection Time    05/05/14  2:34 PM      Result Value Ref Range Status   Specimen Description BLOOD RIGHT HAND   Final   Special Requests BOTTLES DRAWN AEROBIC AND ANAEROBIC 6CCS   Final   Culture  Setup Time     Final   Value: 05/05/2014 18:46     Performed at [facility]   Culture     Final   Value:        BLOOD CULTURE RECEIVED NO GROWTH TO DATE CULTURE WILL BE HELD FOR 5 DAYS BEFORE ISSUING A FINAL NEGATIVE REPORT     Performed at [facility]   Report Status PENDING   Incomplete     Studies: Dg Orthopantogram  05/06/2014   CLINICAL DATA:  73 year old male with fever, recent tooth extraction. Initial encounter.  EXAM: ORTHOPANTOGRAM/PANORAMIC  COMPARISON:  Head CT without contrast 05/05/2014.  FINDINGS: Motion artifact in the midline. Site of recent tooth extraction is unclear, possibly the right posterior bicuspid. Otherwise if it was ancisor then it may be obscured on this image. No periapical lucency identified.  IMPRESSION: Suboptimal due to motion artifact limiting visualization of the incisors and canines. Possible recent tooth extraction site at the right posterior bicuspid. Otherwise no acute findings are evident.   Electronically Signed   By: Augusto Gamble M.D.   On: 05/06/2014 16:37   Ct Head Wo Contrast  05/05/2014   CLINICAL DATA:  Recent tooth extraction, altered mental status.  EXAM: CT HEAD WITHOUT CONTRAST  TECHNIQUE: Contiguous axial images were obtained from the base of the skull through the vertex without intravenous  contrast.  COMPARISON:  CT of the head November 11, 2010  FINDINGS: No intraparenchymal hemorrhage, mass effect, midline shift nor acute large vascular territory infarcts. Mild ventriculomegaly, likely on the basis of global parenchymal brain volume loss as there is commensurate enlargement of cerebral sulci and cerebellar folia, unchanged and within normal range for patient's  age. Bilateral deep brain stimulators via high bifrontal burr holes, unchanged in position in the region of the substantia nigra. Gliosis about the lead tracts.  No abnormal extra-axial fluid collections. Basal cisterns are patent.  Bilateral ocular lens implants, visualized ocular globes and orbital contents are unremarkable. Mild left maxillary bilateral ethmoid mucosal thickening without paranasal sinus air-fluid levels. Left sphenoid mucosal retention cysts, present previously. Visualized mastoid air cells are well aerated. No skull fracture.  IMPRESSION: No acute intracranial process.  Bilateral DBS in situ.  Worsening paranasal sinusitis.   Electronically Signed   By: Awilda Metro   On: 05/05/2014 16:03   Dg Chest Port 1 View  05/06/2014   CLINICAL DATA:  Cough and fever.  EXAM: PORTABLE CHEST - 1 VIEW  COMPARISON:  Chest x-ray 05/05/2014.  FINDINGS: Lung volumes are low. Retrocardiac opacity favored to reflect some subsegmental atelectasis, although underlying airspace consolidation is difficult to exclude. No pleural effusions. No evidence of pulmonary edema. Heart size is borderline enlarged. The patient is rotated to the left on today's exam, resulting in distortion of the mediastinal contours and reduced diagnostic sensitivity and specificity for mediastinal pathology. Right-sided pacemaker in position with lead tips projecting over the right atrium and right ventricle. Left-sided electronic device with leads extending toward the head may represent a deep brain stimulator.  IMPRESSION: 1. Low lung volumes with atelectasis and  consolidation in the left lower lobe.   Electronically Signed   By: Trudie Reed M.D.   On: 05/06/2014 11:20   Dg Chest Port 1 View  (if Code Sepsis Called)  05/05/2014   CLINICAL DATA:  Weakness.  EXAM: PORTABLE CHEST - 1 VIEW  COMPARISON:  Mild cardiomegaly, no acute pulmonary process.  FINDINGS: Cardiac silhouette appears mildly enlarged, even with consideration to this low inspiratory portable examination carotid vasculature markings. Mediastinal silhouette is nonsuspicious. No pleural effusions or focal consolidations. No pneumothorax. Implantable electronic device in left chest may reflect vagal stimulator. Dual lead cardiac pacemaker right chest, unchanged. No pneumothorax.  Multiple EKG lines overlie the patient and may obscure subtle underlying pathology. Lumbar scoliosis partially imaged. Soft tissue planes are nonsuspicious. Remote right posterior third and fourth rib fractures.  IMPRESSION: Mild cardiomegaly, no acute pulmonary process.   Electronically Signed   By: Awilda Metro   On: 05/05/2014 15:25    Scheduled Meds: . antiseptic oral rinse  15 mL Mouth Rinse BID  . carbidopa-levodopa  1 tablet Oral QHS  . carbidopa-levodopa  2 tablet Oral QID  . dapsone  25 mg Oral BID  . docusate sodium  100 mg Oral BID  . donepezil  10 mg Oral Daily  . enoxaparin (LOVENOX) injection  40 mg Subcutaneous Q24H  . entacapone  200 mg Oral QID  . escitalopram  10 mg Oral Daily  . ezetimibe  10 mg Oral Daily  . magnesium oxide  400 mg Oral Daily  . mirabegron ER  50 mg Oral Daily  . piperacillin-tazobactam (ZOSYN)  IV  3.375 g Intravenous Q8H  . QUEtiapine  25 mg Oral QHS  . sodium chloride  3 mL Intravenous Q12H  . tamsulosin  0.4 mg Oral Daily  . vancomycin  750 mg Intravenous Q12H  . vitamin B-12  1,000 mcg Oral Daily   Continuous Infusions:   Active Problems:   Atrial fibrillation   CAD (coronary artery disease)   Dementia   Idiopathic Parkinson's disease   Fever    Time  spent: 35    VIYUOH,ADELINE  C  [facility] Pager [phone]. If 7PM-7AM, please contact night-coverage at [email or web address], password [email or web address] 05/07/2014, 1:49 PM  LOS: 2 days

## 2014-05-07 NOTE — Progress Notes (Signed)
ANTIBIOTIC CONSULT NOTE - FOLLOW UP  Pharmacy Consult for Vancomycin Indication: rule out pneumonia and cellulitis  Allergies  Allergen Reactions  . Crestor [Rosuvastatin Calcium] Other (See Comments)    Muscle weakness   . Bactrim Rash  . Gluten Rash  . Sulfamethoxazole-Trimethoprim Rash    Patient Measurements: Height: 5' 6.93" (170 cm) Weight: 172 lb (78.019 kg) IBW/kg (Calculated) : 65.94 Adjusted Body Weight:   Vital Signs: Temp: 98.2 F (36.8 C) (06/28 1500) Temp src: Oral (06/28 1500) BP: 125/92 mmHg (06/28 1500) Pulse Rate: 94 (06/28 1500) Intake/Output from previous day: 06/27 0701 - 06/28 0700 In: 690 [P.O.:240; IV Piggyback:450] Out: 3500 [Urine:3500] Intake/Output from this shift: Total I/O In: 50 [IV Piggyback:50] Out: 1000 [Urine:1000]  Labs:  Recent Labs  05/05/14 1408 05/05/14 1506 05/06/14 0440  WBC 7.8  --  3.7*  HGB 13.1 14.6 11.7*  PLT 289  --  252  CREATININE 0.78 0.90 0.76   Estimated Creatinine Clearance: 74.4 ml/min (by C-G formula based on Cr of 0.76).  Recent Labs  05/07/14 1610  VANCOTROUGH 10.3     Microbiology: Recent Results (from the past 720 hour(s))  FECAL OCCULT BLOOD, IMMUNOCHEMICAL     Status: None   Collection Time    05/04/14 11:17 AM      Result Value Ref Range Status   Fecal Occult Bld Negative  Negative Final  URINE CULTURE     Status: None   Collection Time    05/05/14  2:12 PM      Result Value Ref Range Status   Specimen Description URINE, RANDOM   Final   Special Requests NONE   Final   Culture  Setup Time     Final   Value: 05/05/2014 20:52     Performed at [facility] Count     Final   Value: 25,000 COLONIES/ML     Performed at [facility]   Culture     Final   Value: Multiple bacterial morphotypes present, none predominant. Suggest appropriate recollection if clinically indicated.     Performed at [facility]   Report Status 05/07/2014 FINAL   Final   CULTURE, BLOOD (ROUTINE X 2)     Status: None   Collection Time    05/05/14  2:34 PM      Result Value Ref Range Status   Specimen Description BLOOD RIGHT ANTECUBITAL   Final   Special Requests BOTTLES DRAWN AEROBIC AND ANAEROBIC 5CCS   Final   Culture  Setup Time     Final   Value: 05/05/2014 18:46     Performed at [facility]   Culture     Final   Value:        BLOOD CULTURE RECEIVED NO GROWTH TO DATE CULTURE WILL BE HELD FOR 5 DAYS BEFORE ISSUING A FINAL NEGATIVE REPORT     Performed at [facility]   Report Status PENDING   Incomplete  CULTURE, BLOOD (ROUTINE X 2)     Status: None   Collection Time    05/05/14  2:34 PM      Result Value Ref Range Status   Specimen Description BLOOD RIGHT HAND   Final   Special Requests BOTTLES DRAWN AEROBIC AND ANAEROBIC 6CCS   Final   Culture  Setup Time     Final   Value: 05/05/2014 18:46     Performed at [facility]   Culture     Final  Value:        BLOOD CULTURE RECEIVED NO GROWTH TO DATE CULTURE WILL BE HELD FOR 5 DAYS BEFORE ISSUING A FINAL NEGATIVE REPORT     [facility]   Report Status PENDING   Incomplete    Anti-infectives   Start     Dose/Rate Route Frequency Ordered Stop   05/06/14 0500  vancomycin (VANCOCIN) IVPB 750 mg/150 ml premix     750 mg 150 mL/hr over 60 Minutes Intravenous Every 12 hours 05/05/14 1855     05/06/14 0000  piperacillin-tazobactam (ZOSYN) IVPB 3.375 g     3.375 g 12.5 mL/hr over 240 Minutes Intravenous Every 8 hours 05/05/14 1855     05/05/14 2200  dapsone tablet 25 mg     25 mg Oral 2 times daily 05/05/14 1836     05/05/14 1545  vancomycin (VANCOCIN) IVPB 1000 mg/200 mL premix     1,000 mg 200 mL/hr over 60 Minutes Intravenous  Once 05/05/14 1537 05/05/14 1752   05/05/14 1545  piperacillin-tazobactam (ZOSYN) IVPB 3.375 g     3.375 g 12.5 mL/hr over 240 Minutes Intravenous  Once 05/05/14 1537 05/05/14 1652      Assessment: 73yo male on Vancomycin  750mg  IV q12 for cellulitis, now with possibility of aspiration pneumomia.  AFeb and previous Cr has been < 1 with good uop.  Cultures are NTD.  Will adjust trough goal for pneumonia target  Goal of Therapy:  Vancomycin trough level 15-20 mcg/ml  Plan:  1-  Change Vancomycin 1000mg  IV q12 2-  F/U culture results 3-  Watch renal function  Marisue HumbleKendra Jamol Ginyard, PharmD Clinical Pharmacist [facility]

## 2014-05-07 NOTE — Evaluation (Signed)
Occupational Therapy Evaluation Patient Details Name: [name] MRN: [id number] DOB: 15-Mar-1941 Today's Date: 05/07/2014    History of Present Illness [name] is a 73 y.o. male past medical history significant for Parkinson's disease status post deep brain stimulator>> [facility], hypertension, CAD, dementia who presents with fever and confusion. Pt with recent replacement of stimulator and battery as well as tooth extraction on 6/24   Clinical Impression   Pt presents with below problem list. Pt independent with ADLs, PTA. Feel pt will benefit from acute OT to increase independence prior to d/c. Recommending CIR for additional rehab prior to d/c home.    Follow Up Recommendations  CIR    Equipment Recommendations  Tub/shower seat    Recommendations for Other Services       Precautions / Restrictions Precautions Precautions: Fall Restrictions Weight Bearing Restrictions: No      Mobility Bed Mobility               General bed mobility comments: not assessed  Transfers Overall transfer level: Needs assistance Equipment used: Rolling walker (2 wheeled) Transfers: Sit to/from Stand Sit to Stand: Mod assist         General transfer comment: Cues for technique and hand placement. Pt using momentum to stand.     Balance Overall balance assessment: Needs assistance       Standing balance-Leahy Scale: Poor Standing balance comment: Pt with posterior lean at sink. Able to stand briefly with Min guard assist but majority was Min-Max A for balance.                            ADL Overall ADL's : Needs assistance/impaired     Grooming: Brushing hair;Standing;Minimal assistance;Moderate assistance;Maximal assistance;Oral care;Wash/dry face (decreased balance)   Upper Body Bathing: Set up;Supervision/ safety;Sitting           Lower Body Dressing: Moderate assistance;Sit to/from stand   Toilet  Transfer: Moderate assistance;Ambulation;RW (chair)           Functional mobility during ADLs: Moderate assistance;Rolling walker General ADL Comments: Recommended pt sit for LB bathing (spoke with wife about recommending shower chair). Pt stood at sink to perform grooming tasks- assist with balance.     Vision    wears glasses all the time.                 Perception     Praxis      Pertinent Vitals/Pain No apparent distress.     Hand Dominance     Extremity/Trunk Assessment Upper Extremity Assessment Upper Extremity Assessment: LUE deficits/detail LUE Deficits / Details: LUE with tremor and slightly weaker than RUE   Lower Extremity Assessment Lower Extremity Assessment: Defer to PT evaluation       Communication Communication Communication: No difficulties   Cognition Arousal/Alertness: Awake/alert Behavior During Therapy: WFL for tasks assessed/performed Overall Cognitive Status: Impaired/Different from baseline Area of Impairment: Orientation;Safety/judgement Orientation Level: Disoriented to;Place       Safety/Judgement: Decreased awareness of safety   Problem Solving: Slow processing     General Comments       Exercises       Shoulder Instructions      Home Living Family/patient expects to be discharged to:: Private residence Living Arrangements: Spouse/significant other Available Help at Discharge: Family;Personal care attendant;Available 24 hours/day Type of Home: House Home Access: Stairs to enter [employer] CorporationEntrance Stairs-Number of Steps: 2 Entrance Stairs-Rails:  Right Home Layout: One level     Bathroom Shower/Tub: Walk-in shower         Home Equipment: Walker - 2 wheels;Grab bars - tub/shower;Cane - single point       Prior Functioning/Environment Level of Independence: Independent with assistive device(s)        Comments: pt normally walks with a rollator, performs transfers and ADLs on his own. he goes to the gym to  participate in a Parkinson's program.    OT Diagnosis: Generalized weakness;Cognitive deficits;Other (comment) (decreased balance)   OT Problem List: Decreased strength;Impaired balance (sitting and/or standing);Decreased knowledge of use of DME or AE;Decreased knowledge of precautions;Decreased cognition;Decreased safety awareness   OT Treatment/Interventions: Self-care/ADL training;DME and/or AE instruction;Therapeutic activities;Cognitive remediation/compensation;Patient/family education;Balance training    OT Goals(Current goals can be found in the care plan section) Acute Rehab OT Goals Patient Stated Goal: not stated OT Goal Formulation: With patient/family Time For Goal Achievement: 05/21/14 Potential to Achieve Goals: Good ADL Goals Pt Will Perform Grooming: standing;with min guard assist Pt Will Perform Lower Body Bathing: with min guard assist;sit to/from stand Pt Will Perform Lower Body Dressing: with min guard assist;sit to/from stand Pt Will Transfer to Toilet: with min guard assist;ambulating;bedside commode Pt Will Perform Toileting - Clothing Manipulation and hygiene: with min guard assist;sit to/from stand  OT Frequency: Min 2X/week   Barriers to D/C:            Co-evaluation              End of Session Equipment Utilized During Treatment: Gait belt;Rolling walker  Activity Tolerance: Patient tolerated treatment well Patient left: in chair;with call bell/phone within reach;with chair alarm set;with family/visitor present   Time: 6812-7517 OT Time Calculation (min): 26 min Charges:  OT General Charges $OT Visit: 1 Procedure OT Evaluation $Initial OT Evaluation Tier I: 1 Procedure OT Treatments $Self Care/Home Management : 8-22 mins G-CodesEarlie Raveling OTR/L [phone] 05/07/2014, 6:33 PM

## 2014-05-07 NOTE — Evaluation (Signed)
Physical Therapy Evaluation Patient Details Name: [name] Hartner MRN: [id number] DOB: 1941/06/08 Today's Date: 05/07/2014   History of Present Illness  [name] Bouffard is a 73 y.o. male past medical history significant for Parkinson's disease status post deep brain stimulator>> followed [facility] Dr[facility] Beaulah Cor[facility]atter[facility], hypertension, CAD, dementia who presents with fever and confusion. Pt with recent replacement of stimulator and battery as well as tooth extraction on 6/24  Clinical Impression  Pt pleasant but with decreased awareness, processing, word finding and function from baseline. Pt normally very mobile with rollator and states he has had recent falls but only when brain stimulator not functioning properly. Pt currently very different than baseline with balance, transfers and function with wife unable to physically assist pt at current mod A at home. Pt will benefit from acute therapy to maximize mobility, safety and balance to decrease burden of care and recommend CIR for D/C.    Follow Up Recommendations CIR;Supervision/Assistance - 24 hour    Equipment Recommendations  None recommended by PT    Recommendations for Other Services Rehab consult;OT consult     Precautions / Restrictions Precautions Precautions: Fall      Mobility  Bed Mobility Overal bed mobility: Needs Assistance Bed Mobility: Supine to Sit     [facility];Min assist        Transfers Overall transfer level: Needs assistance   Transfers: Sit to/from Stand Sit to Stand: Mod assist         General transfer comment: pt with momentum and 3 attempts at standing before able to complete with mod assist for anterior translation due to posterior lean  Ambulation/Gait Ambulation/Gait assistance: Mod assist Ambulation Distance (Feet): 15 Feet Assistive device: Rolling walker (2 wheeled) Gait Pattern/deviations: Narrow base of support;Shuffle;Ataxic;Scissoring;Leaning posteriorly    Gait velocity interpretation: Below normal speed for age/gender General Gait Details: pt with posterior right lean with assist for balance, clearing obstacles and pt periodically picking up RW despite cues. Pt scissoring x 2 and generally unsafe and unsteady with gait  Stairs            Wheelchair Mobility    Modified Rankin (Stroke Patients Only)       Balance Overall balance assessment: Needs assistance   Sitting balance-Leahy Scale: Fair       Standing balance-Leahy Scale: Poor                               Pertinent Vitals/Pain No pain sats 95% on Ra HR 60    Home Living Family/patient expects to be discharged to:: Private residence Living Arrangements: Spouse/significant other Available Help at Discharge: Family;Personal care attendant;Available 24 hours/day Type of Home: House Home Access: Stairs to enter Entrance Stairs-Rails: Right Entrance Stairs-Number of Steps: 2 Home Layout: One level Home Equipment: Walker - 2 wheels;Grab bars - tub/shower;Cane - single point      Prior Function Level of Independence: Independent with assistive device(s)         Comments: pt normally walks with a rollator, performs transfers and ADLs on his own. he goes to the gym to participate in a Parkinson's program     Hand Dominance        Extremity/Trunk Assessment   Upper Extremity Assessment: LUE deficits/detail;Generalized weakness       LUE Deficits / Details: LUE with tremor   Lower Extremity Assessment: Generalized weakness      Cervical / Trunk Assessment:  Normal;Other exceptions  Communication   Communication: No difficulties  Cognition Arousal/Alertness: Awake/alert Behavior During Therapy: Flat affect Overall Cognitive Status: Impaired/Different from baseline Area of Impairment: Memory;Problem solving;Safety/judgement         Safety/Judgement: Decreased awareness of deficits;Decreased awareness of safety   Problem Solving:  Slow processing;Difficulty sequencing      General Comments      Exercises        Assessment/Plan    PT Assessment Patient needs continued PT services  PT Diagnosis Abnormality of gait;Altered mental status   PT Problem List Decreased strength;Decreased cognition;Decreased activity tolerance;Decreased balance;Decreased knowledge of use of DME;Decreased safety awareness  PT Treatment Interventions Gait training;Functional mobility training;Stair training;DME instruction;Balance training;Neuromuscular re-education;Cognitive remediation;Patient/family education;Therapeutic activities   PT Goals (Current goals can be found in the Care Plan section) Acute Rehab PT Goals Patient Stated Goal: be able to go see Dr.Tatter PT Goal Formulation: With patient/family Time For Goal Achievement: 05/21/14 Potential to Achieve Goals: Good    Frequency Min 3X/week   Barriers to discharge Decreased caregiver support      Co-evaluation               End of Session Equipment Utilized During Treatment: Gait belt Activity Tolerance: Patient tolerated treatment well Patient left: in chair;with call bell/phone within reach;with chair alarm set;with family/visitor present Nurse Communication: Mobility status;Precautions         Time: 0630-1601 PT Time Calculation (min): 27 min   Charges:   PT Evaluation $Initial PT Evaluation Tier I: 1 Procedure PT Treatments $Therapeutic Activity: 8-22 mins   PT G CodesDelorse Lek 05/07/2014, 2:37 PM Delaney Meigs, PT [phone]

## 2014-05-08 DIAGNOSIS — G20 Parkinson's disease: Secondary | ICD-10-CM

## 2014-05-08 DIAGNOSIS — J690 Pneumonitis due to inhalation of food and vomit: Secondary | ICD-10-CM

## 2014-05-08 DIAGNOSIS — Z950 Presence of cardiac pacemaker: Secondary | ICD-10-CM

## 2014-05-08 LAB — CBC
HCT: 41 % (ref 39.0–52.0)
HEMOGLOBIN: 12.9 g/dL — AB (ref 13.0–17.0)
MCH: 27.7 pg (ref 26.0–34.0)
MCHC: 31.5 g/dL (ref 30.0–36.0)
MCV: 88.2 fL (ref 78.0–100.0)
Platelets: 285 10*3/uL (ref 150–400)
RBC: 4.65 MIL/uL (ref 4.22–5.81)
RDW: 14.4 % (ref 11.5–15.5)
WBC: 4.4 10*3/uL (ref 4.0–10.5)

## 2014-05-08 LAB — BASIC METABOLIC PANEL
BUN: 9 mg/dL (ref 6–23)
CALCIUM: 8.8 mg/dL (ref 8.4–10.5)
CO2: 26 meq/L (ref 19–32)
CREATININE: 0.72 mg/dL (ref 0.50–1.35)
Chloride: 101 mEq/L (ref 96–112)
GFR calc Af Amer: >90 mL/min (ref >90)
GFR calc non Af Amer: 89 mL/min — ABNORMAL LOW (ref >90)
GLUCOSE: 101 mg/dL — AB (ref 70–99)
Potassium: 4.1 mEq/L (ref 3.7–5.3)
Sodium: 139 mEq/L (ref 137–147)

## 2014-05-08 MED ORDER — DOXYCYCLINE HYCLATE 100 MG PO TABS
100.0000 mg | ORAL_TABLET | Freq: Two times a day (BID) | ORAL | Status: DC
  Administered 2014-05-08 – 2014-05-09 (×2): 100 mg via ORAL
  Filled 2014-05-08 (×4): qty 1

## 2014-05-08 NOTE — Progress Notes (Signed)
[facility] PROGRESS NOTE  KENDERRICK MUSARRA IDC:[id number] DOB: Mar 17, 1941 DOA: 05/05/2014 PCP: Neena Rhymes, MD  Assessment/Plan: Active Problems:  Fever/Probable PNA  - early aspiration pneumonia vs possible DBS pocket cellulitis vs dental infection  -repeat cxr with possible Left airspace disease>>continue current abx -Blood and urine cultures so far with no growth, d/c vancomycin and Zosyn >> change to doxy and  remains afebrile. -panorex neg for infection, no reports of abscess  -erythema/possible DBS pocket cellulitis resolving with abx Acute encephalopathy in patient with dementia  -Likely secondary to above  -improving withTreatment as above, continue Seroquel and follow   Atrial fibrillation-status post pacemaker  -Rate controlled, continue outpatient medications  -Continue aspirin  CAD (coronary artery disease)  -He is chest pain-free, continue outpatient medications  Dementia  -Continue Seroquel  Idiopathic Parkinson's disease  -Continue outpatient medications -consult PT/OT  Code Status: full Family Communication: wife at bedside Disposition Plan: awaiting CIR   Consultants:  none  Procedures:  none  Antibiotics:  Vanc & Zosyn started 6/26>>6/29  Doxyxycline 6/29>>  HPI/Subjective: Pt sitting on chair commode.answers simple questions appropriately, states cough better. Calm today Objective: Filed Vitals:   05/08/14 0633  BP: 133/82  Pulse: 65  Temp: 97.4 F (36.3 C)  Resp: 18    Intake/Output Summary (Last 24 hours) at 05/08/14 1407 Last data filed at 05/08/14 1351  Gross per 24 hour  Intake    844 ml  Output   1700 ml  Net   -856 ml   Filed Weights   05/05/14 1806  Weight: 78.019 kg (172 lb)    Exam:  General: alert & oriented x 3 In NAD, intermittent involuntary mov'ts Cardiovascular: RRR, nl S1 s2; no erythema over DBS in L. Chest wall Respiratory: decreased BS at bases, no wheezes Abdomen: soft +BS NT/ND, no masses  palpable Extremities: No cyanosis and no edema    Data Reviewed: Basic Metabolic Panel:  Recent Labs Lab 05/05/14 1408 05/05/14 1506 05/06/14 0440 05/08/14 0553  NA 134* 136* 137 139  K 4.3 4.1 4.1 4.1  CL 97 98 102 101  CO2 25  --  24 26  GLUCOSE 113* 109* 95 101*  BUN 13 13 13 9   CREATININE 0.78 0.90 0.76 0.72  CALCIUM 9.1  --  8.1* 8.8   Liver Function Tests:  Recent Labs Lab 05/05/14 1408  AST 27  ALT 13  ALKPHOS 95  BILITOT 0.8  PROT 7.1  ALBUMIN 3.8   No results found for this basename: LIPASE, AMYLASE,  in the last 168 hours No results found for this basename: AMMONIA,  in the last 168 hours CBC:  Recent Labs Lab 05/05/14 1408 05/05/14 1506 05/06/14 0440 05/08/14 0553  WBC 7.8  --  3.7* 4.4  NEUTROABS 6.0  --   --   --   HGB 13.1 14.6 11.7* 12.9*  HCT 41.1 43.0 36.4* 41.0  MCV 87.4  --  87.7 88.2  PLT 289  --  252 285   Cardiac Enzymes:  Recent Labs Lab 05/05/14 1724  TROPONINI <0.30   BNP (last 3 results) No results found for this basename: PROBNP,  in the last 8760 hours CBG:  Recent Labs Lab 05/07/14 1215  GLUCAP 111*    Recent Results (from the past 240 hour(s))  FECAL OCCULT BLOOD, IMMUNOCHEMICAL     Status: None   Collection Time    05/04/14 11:17 AM      Result Value Ref Range Status   Fecal Occult  Bld Negative  Negative Final  URINE CULTURE     Status: None   Collection Time    05/05/14  2:12 PM      Result Value Ref Range Status   Specimen Description URINE, RANDOM   Final   Special Requests NONE   Final   Culture  Setup Time     Final   Value: 05/05/2014 20:52     Performed at [facility]   Colony Count     Final   Value: 25,000 COLONIES/ML     [facility]formed at [facility]   Culture     Final   Value: Multiple bacterial morphotypes present, none predominant. Suggest appropriate recollection if clinically indicated.     Performed at [facility]   Report Status 05/07/2014 FINAL   Final   CULTURE, BLOOD (ROUTINE X 2)     Status: None   Collection Time    05/05/14  2:34 PM      Result Value Ref Range Status   Specimen Description BLOOD RIGHT ANTECUBITAL   Final   Special Requests BOTTLES DRAWN AEROBIC AND ANAEROBIC 5CCS   Final   Culture  Setup Time     Final   Value: 05/05/2014 18:46     Performed at [facility]   Culture     Final   Value:        BLOOD CULTURE RECEIVED NO GROWTH TO DATE CULTURE WILL BE HELD FOR 5 DAYS BEFORE ISSUING A FINAL NEGATIVE REPORT     Performed at [facility]   Report Status PENDING   Incomplete  CULTURE, BLOOD (ROUTINE X 2)     Status: None   Collection Time    05/05/14  2:34 PM      Result Value Ref Range Status   Specimen Description BLOOD RIGHT HAND   Final   Special Requests BOTTLES DRAWN AEROBIC AND ANAEROBIC 6CCS   Final   Culture  Setup Time     Final   Value: 05/05/2014 18:46     Performed at [facility]   Culture     Final   Value:        BLOOD CULTURE RECEIVED NO GROWTH TO DATE CULTURE WILL BE HELD FOR 5 DAYS BEFORE ISSUING A FINAL NEGATIVE REPORT     Performed at [facility]   Report Status PENDING   Incomplete     Studies: Dg Orthopantogram  05/06/2014   CLINICAL DATA:  73 year old male with fever, recent tooth extraction. Initial encounter.  EXAM: ORTHOPANTOGRAM/PANORAMIC  COMPARISON:  Head CT without contrast 05/05/2014.  FINDINGS: Motion artifact in the midline. Site of recent tooth extraction is unclear, possibly the right posterior bicuspid. Otherwise if it was ancisor then it may be obscured on this image. No periapical lucency identified.  IMPRESSION: Suboptimal due to motion artifact limiting visualization of the incisors and canines. Possible recent tooth extraction site at the right posterior bicuspid. Otherwise no acute findings are evident.   Electronically Signed   By: Augusto GambleLee  Hall M.D.   On: 05/06/2014 16:37    Scheduled Meds: . antiseptic oral rinse  15 mL Mouth Rinse BID  .  carbidopa-levodopa  1 tablet Oral QHS  . carbidopa-levodopa  2 tablet Oral QID  . dapsone  25 mg Oral BID  . docusate sodium  100 mg Oral BID  . donepezil  10 mg Oral Daily  . enoxaparin (LOVENOX) injection  40 mg Subcutaneous Q24H  . entacapone  200 mg Oral QID  . escitalopram  10 mg Oral Daily  . ezetimibe  10 mg Oral Daily  . magnesium oxide  400 mg Oral Daily  . mirabegron ER  50 mg Oral Daily  . piperacillin-tazobactam (ZOSYN)  IV  3.375 g Intravenous Q8H  . QUEtiapine  25 mg Oral QHS  . sodium chloride  3 mL Intravenous Q12H  . tamsulosin  0.4 mg Oral Daily  . vancomycin  1,000 mg Intravenous Q12H  . vitamin B-12  1,000 mcg Oral Daily   Continuous Infusions:   Active Problems:   Atrial fibrillation   CAD (coronary artery disease)   Dementia   Idiopathic Parkinson's disease   Fever    Time spent: 35    [facility] C  [facility] Pager [phone]. If 7PM-7AM, please contact night-coverage at [email or web address], password [facility] 05/08/2014, 2:07 PM  LOS: 3 days

## 2014-05-08 NOTE — Consult Note (Signed)
Physical Medicine and Rehabilitation Consult Reason for Consult: Fever/Parkinson's disease Referring Physician: [facility]   HPI: [name] is a 73 y.o. right-handed male with history significant for Parkinson's disease status post deep brain stimulator by Dr. Angelyn Punt neurosurgery at [facility], CAD/pacemaker, dementia. Patient lives with his wife used a rollator prior to admission and goes to the gym to participate in a Parkinson's program. Admitted 05/05/2014 with chills, fever and generalized weakness with increasing confusion. Reports of recent tooth extraction 2 days prior to admission. Chest x-ray negative. Cranial CT scan with no acute intracranial abnormalities with bilateral deep brain stimulators via high bifrontal Burr holes. Urine study 25,000 colony multi-species. Patient placed on broad-spectrum antibiotics. Blood cultures remain negative. Subcutaneous Lovenox added for DVT prophylaxis. Patient remains on Sinemet for history of Parkinson's disease. Currently maintained on a dysphagia to thin liquid diet. Mental status continues to improve recently placed on Seroquel. Physical therapy evaluation completed 05/07/2014 with recommendations for physical medicine rehabilitation consult.  Pt has had multiple revisions to DBS originally place 2012.  Had battery replacement last fall, followed by lead adjustment , then lead replacement in April of this year.   Review of Systems  Constitutional: Positive for fever.  Musculoskeletal: Positive for falls.  Neurological: Positive for speech change and weakness.  Psychiatric/Behavioral: Positive for depression and memory loss.  All other systems reviewed and are negative.  Past Medical History  Diagnosis Date  . Parkinson disease   . Bradycardia     syncope due to prolonged pauses, resolved with PPM  . Sleep apnea   . Hypertension   . Dermatitis   . Depression   . Rib fractures 2011    S/p fall  . Maxillary fracture 2009    Right  . BPH (benign prostatic hypertrophy)   . Coronary artery disease 1986    Inferior wall MI treated with Angioplasty  . Normal echocardiogram 2007    Mild LVH with Impaired relaxation, Mild-Mod. Aortic Root dilation, Mild Aortic Sclerosis, Mild MR, Mild TR, Ef-50-55%  . Normal nuclear stress test 2006    Normal EF-54%  . CAD (coronary artery disease)   . Inferior MI   . 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 Maxillary fracture 2009    Right side  . Rib fracture 07/2010  . Atrioventricular block, complete   . Pacemaker   . H/O hiatal hernia    Past Surgical History  Procedure Laterality Date  . Pacemaker insertion  10/28/10    MDT implanted by Dr Ladona Ridgel  . Deep brain stimulator placement      Parkinsons  . Arthroscopic knee      Left knee  . Cataract surgery      Bilateral  . Inguinal hernia repair      Bilateral  . Cardiac catheterization      Multiple Angioplasties, last 2002  . Removal of ganglion cyst      right wrist  . Insertion / placement / revision neurostimulator    . Eye surgery    . Insert / replace / remove pacemaker     Family History  Problem Relation Age of Onset  . Heart attack Mother   . Heart disease Mother   . Hypertension Mother   . Heart attack Father   . Other Father     renal calculi  . Heart disease Brother   . Heart attack Sister    Social History:  reports that he has never  smoked. He does not have any smokeless tobacco history on file. He reports that he does not drink alcohol or use illicit drugs. Allergies:  Allergies  Allergen Reactions  . Crestor [Rosuvastatin Calcium] Other (See Comments)    Muscle weakness   . Bactrim Rash  . Gluten Rash  . Sulfamethoxazole-Trimethoprim Rash   Medications Prior to Admission  Medication Sig Dispense Refill  . aspirin 81 MG tablet Take 81 mg by mouth daily.        Marland Kitchen. CALCIUM-VITAMIN D PO Take 1 tablet by mouth daily. 400mg  of calcium and 1000units of D3      . carbidopa-levodopa  (SINEMET) 25-100 MG per tablet Take 1 tablet by mouth at bedtime.        . Carbidopa-Levodopa-Entacapone (STALEVO 200 PO) Take 200 mg by mouth 4 (four) times daily.        . dapsone 25 MG tablet Take 25 mg by mouth 2 (two) times daily.        Marland Kitchen. donepezil (ARICEPT) 10 MG tablet Take 10 mg by mouth daily.        Marland Kitchen. escitalopram (LEXAPRO) 10 MG tablet Take 10 mg by mouth daily.        Marland Kitchen. ezetimibe (ZETIA) 10 MG tablet Take 10 mg by mouth daily.      . Magnesium 400 MG CAPS Take 400 mg by mouth daily.       . mirabegron ER (MYRBETRIQ) 50 MG TB24 tablet Take 50 mg by mouth daily.      . nitroGLYCERIN (NITROSTAT) 0.4 MG SL tablet Place 0.4 mg under the tongue every 5 (five) minutes as needed for chest pain.      Marland Kitchen. QUEtiapine (SEROQUEL) 25 MG tablet Take 25 mg by mouth at bedtime.      . Tamsulosin HCl (FLOMAX) 0.4 MG CAPS Take 0.4 mg by mouth daily.       . vitamin B-12 (CYANOCOBALAMIN) 1000 MCG tablet Take 1,000 mcg by mouth daily.        Home: Home Living Family/patient expects to be discharged to:: Private residence Living Arrangements: Spouse/significant other Available Help at Discharge: Family;Personal care attendant;Available 24 hours/day Type of Home: House Home Access: Stairs to enter Entergy CorporationEntrance Stairs-Number of Steps: 2 Entrance Stairs-Rails: Right Home Layout: One level Home Equipment: Walker - 2 wheels;Grab bars - tub/shower;Cane - single point  Functional History: Prior Function Level of Independence: Independent with assistive device(s) Comments: pt normally walks with a rollator, performs transfers and ADLs on his own. he goes to the gym to participate in a Parkinson's program. Functional Status:  Mobility: Bed Mobility Overal bed mobility: Needs Assistance Bed Mobility: Supine to Sit Supine to sit: HOB elevated;Min assist General bed mobility comments: not assessed Transfers Overall transfer level: Needs assistance Equipment used: Rolling walker (2 wheeled) Transfers: Sit  to/from Stand Sit to Stand: Mod assist General transfer comment: Cues for technique and hand placement. Pt using momentum to stand.  Ambulation/Gait Ambulation/Gait assistance: Mod assist Ambulation Distance (Feet): 15 Feet Assistive device: Rolling walker (2 wheeled) Gait Pattern/deviations: Narrow base of support;Shuffle;Ataxic;Scissoring;Leaning posteriorly Gait velocity interpretation: Below normal speed for age/gender General Gait Details: pt with posterior right lean with assist for balance, clearing obstacles and pt periodically picking up RW despite cues. Pt scissoring x 2 and generally unsafe and unsteady with gait    ADL: ADL Overall ADL's : Needs assistance/impaired Grooming: Brushing hair;Standing;Minimal assistance;Moderate assistance;Maximal assistance;Oral care;Wash/dry face Upper Body Bathing: Set up;Supervision/ safety;Sitting Lower Body Dressing: Moderate assistance;Sit to/from stand  Toilet Transfer: Moderate assistance;Ambulation;RW (chair) Functional mobility during ADLs: Moderate assistance;Rolling walker General ADL Comments: Recommended pt sit for LB bathing (spoke with wife about recommending shower chair). Pt stood at sink to perform grooming tasks- assist with balance.  Cognition: Cognition Overall Cognitive Status: Impaired/Different from baseline Orientation Level: Disoriented to place;Disoriented to time;Oriented to person;Disoriented to situation Cognition Arousal/Alertness: Awake/alert Behavior During Therapy: WFL for tasks assessed/performed Overall Cognitive Status: Impaired/Different from baseline Area of Impairment: Orientation;Safety/judgement Orientation Level: Disoriented to;Place Safety/Judgement: Decreased awareness of safety Problem Solving: Slow processing  Blood pressure 133/82, pulse 65, temperature 97.4 F (36.3 C), temperature source Oral, resp. rate 18, height 5' 6.93" (1.7 m), weight 78.019 kg (172 lb), SpO2 92.00%. Physical Exam    Vitals reviewed. HENT:  Head: Normocephalic.  Eyes: EOM are normal.  Neck: Normal range of motion. Neck supple. No thyromegaly present.  Cardiovascular: Normal rate and regular rhythm.   Respiratory: Effort normal and breath sounds normal. No respiratory distress.  GI: Soft. Bowel sounds are normal. He exhibits no distension.  Neurological:  Alert male in no acute distress. Speech is a bit halting but intelligible. He was able to provide his name and date of birth. Limited medical historian  Skin: Skin is warm and dry.  motor 5/5 in B UE and BLE chorea noted LUE Cerebellar intact  No cogwheeling  Memory intially with no ST recall but with cuing was able to remember 3 objects after 2 minute delay  Results for orders placed during the hospital encounter of 05/05/14 (from the past 24 hour(s))  GLUCOSE, CAPILLARY     Status: Abnormal   Collection Time    05/07/14 12:15 PM      Result Value Ref Range   Glucose-Capillary 111 (*) 70 - 99 mg/dL  VANCOMYCIN, TROUGH     Status: None   Collection Time    05/07/14  4:10 PM      Result Value Ref Range   Vancomycin Tr 10.3  10.0 - 20.0 ug/mL  CBC     Status: Abnormal   Collection Time    05/08/14  5:53 AM      Result Value Ref Range   WBC 4.4  4.0 - 10.5 K/uL   RBC 4.65  4.22 - 5.81 MIL/uL   Hemoglobin 12.9 (*) 13.0 - 17.0 g/dL   HCT 16.1  09.6 - 04.5 %   MCV 88.2  78.0 - 100.0 fL   MCH 27.7  26.0 - 34.0 pg   MCHC 31.5  30.0 - 36.0 g/dL   RDW 40.9  81.1 - 91.4 %   Platelets 285  150 - 400 K/uL   Dg Orthopantogram  05/06/2014   CLINICAL DATA:  73 year old male with fever, recent tooth extraction. Initial encounter.  EXAM: ORTHOPANTOGRAM/PANORAMIC  COMPARISON:  Head CT without contrast 05/05/2014.  FINDINGS: Motion artifact in the midline. Site of recent tooth extraction is unclear, possibly the right posterior bicuspid. Otherwise if it was ancisor then it may be obscured on this image. No periapical lucency identified.  IMPRESSION:  Suboptimal due to motion artifact limiting visualization of the incisors and canines. Possible recent tooth extraction site at the right posterior bicuspid. Otherwise no acute findings are evident.   Electronically Signed   By: Augusto Gamble M.D.   On: 05/06/2014 16:37   Dg Chest Port 1 View  05/06/2014   CLINICAL DATA:  Cough and fever.  EXAM: PORTABLE CHEST - 1 VIEW  COMPARISON:  Chest x-ray 05/05/2014.  FINDINGS: Lung volumes  are low. Retrocardiac opacity favored to reflect some subsegmental atelectasis, although underlying airspace consolidation is difficult to exclude. No pleural effusions. No evidence of pulmonary edema. Heart size is borderline enlarged. The patient is rotated to the left on today's exam, resulting in distortion of the mediastinal contours and reduced diagnostic sensitivity and specificity for mediastinal pathology. Right-sided pacemaker in position with lead tips projecting over the right atrium and right ventricle. Left-sided electronic device with leads extending toward the head may represent a deep brain stimulator.  IMPRESSION: 1. Low lung volumes with atelectasis and consolidation in the left lower lobe.   Electronically Signed   By: Trudie Reed M.D.   On: 05/06/2014 11:20    Assessment/Plan: Diagnosis: Parkinson's disease with decline in functional status after fever of unknown etiology 1. Does the need for close, 24 hr/day medical supervision in concert with the patient's rehab needs make it unreasonable for this patient to be served in a less intensive setting? Yes 2. Co-Morbidities requiring supervision/potential complications: Afib, CAD, B12 def 3. Due to bladder management, bowel management, safety, skin/wound care, disease management, medication administration and patient education, does the patient require 24 hr/day rehab nursing? Yes 4. Does the patient require coordinated care of a physician, rehab nurse, PT (1-2 hrs/day, 5 days/week), OT (1-2 hrs/day, 5 days/week)  and SLP (.5-1 hrs/day, 5 days/week) to address physical and functional deficits in the context of the above medical diagnosis(es)? Yes Addressing deficits in the following areas: balance, endurance, locomotion, transferring, bowel/bladder control, bathing, dressing, feeding, grooming, toileting, cognition, speech, swallowing and psychosocial support 5. Can the patient actively participate in an intensive therapy program of at least 3 hrs of therapy per day at least 5 days per week? Yes 6. The potential for patient to make measurable gains while on inpatient rehab is good 7. Anticipated functional outcomes upon discharge from inpatient rehab are min assist  with PT, min assist with OT, min assist with SLP. 8. Estimated rehab length of stay to reach the above functional goals is: 10-14 days 9. Does the patient have adequate social supports to accommodate these discharge functional goals? Yes 10. Anticipated D/C setting: Home 11. Anticipated post D/C treatments: HH therapy 12. Overall Rehab/Functional Prognosis: good  RECOMMENDATIONS: This patient's condition is appropriate for continued rehabilitative care in the following setting: CIR Patient has agreed to participate in recommended program. Potentially Note that insurance prior authorization may be required for reimbursement for recommended care.  Comment: wife has hired respite care    05/08/2014

## 2014-05-08 NOTE — Progress Notes (Signed)
Speech Language Pathology Treatment: Dysphagia  Patient Details Name: [name] MRN: [id number] DOB: 1940-12-17 Today's Date: 05/08/2014 Time: 1310-1330 SLP Time Calculation (min): 20 min  Assessment / Plan / Recommendation Clinical Impression  Pt. Seen with focus on dysphagia during portion of lunch meal with wife at bedside.  Wife reports recommendation of no straws was made following tooth extraction.  Wife stated pt. Had swallow study in "xray" with double swallows and alternate solids/liquids recommended.  Pt. consumed thin via straw with moderate intermittent cues for small sips and required mod-max verbal and visual cues to follow swallow precautions.  Appeared to masticate Dys 2 without overt difficulties.  Pt. Transferring to inpatient rehab today where he will be followed by ST for safety and diet advancement when appropriate.  Continue Dys 2 texture and thin (okay for straws)   HPI HPI: 73 y.o. male past medical history significant for Parkinson's disease status post deep brain stimulator, seen at [facility] for most care, CAD, dementia admitted with fevers and. MD documentation states wife report of coughing with meals. Underwent tooth extraction 2 days ago.  CT 6/26 revealed mild cardiomegaly, no acute pulmonary process.  CXR's performed at [facility] since 2011 have been negative.  Suspect swallow assessment performed at [facility] due to sign in room stating pt. is not to use straws and pt. stated "swallow test done 2 months ago was ok." (? reliability with his dementia).   Pertinent Vitals WDL  SLP Plan  Continue with current plan of care    Recommendations Diet recommendations: Dysphagia 2 (fine chop);Thin liquid Liquids provided via: Cup;Straw Medication Administration: Whole meds with liquid Supervision: Patient able to self feed;Full supervision/cueing for compensatory strategies Compensations: Slow rate;Small sips/bites;Check for pocketing Postural Changes and/or Swallow  Maneuvers: Seated upright 90 degrees;Upright 30-60 min after meal;Out of bed for meals              Oral Care Recommendations: Oral care BID Follow up Recommendations: Inpatient Rehab Plan: Continue with current plan of care    GO     [name] M.Ed [employer] [phone]  05/08/2014

## 2014-05-09 ENCOUNTER — Inpatient Hospital Stay (HOSPITAL_COMMUNITY)
Admission: RE | Admit: 2014-05-09 | Discharge: 2014-05-24 | DRG: 945 | Disposition: A | Discharge status: Home / Self Care | Payer: Medicare Other | Source: Intra-hospital | Attending: Physical Medicine & Rehabilitation | Admitting: Physical Medicine & Rehabilitation

## 2014-05-09 ENCOUNTER — Encounter (HOSPITAL_COMMUNITY): Payer: Self-pay | Admitting: *Deleted

## 2014-05-09 DIAGNOSIS — I4891 Unspecified atrial fibrillation: Secondary | ICD-10-CM

## 2014-05-09 DIAGNOSIS — Z79899 Other long term (current) drug therapy: Secondary | ICD-10-CM | POA: Diagnosis not present

## 2014-05-09 DIAGNOSIS — I2510 Atherosclerotic heart disease of native coronary artery without angina pectoris: Secondary | ICD-10-CM

## 2014-05-09 DIAGNOSIS — I10 Essential (primary) hypertension: Secondary | ICD-10-CM

## 2014-05-09 DIAGNOSIS — G20A1 Parkinson's disease without dyskinesia, without mention of fluctuations: Secondary | ICD-10-CM | POA: Diagnosis present

## 2014-05-09 DIAGNOSIS — F0390 Unspecified dementia without behavioral disturbance: Secondary | ICD-10-CM

## 2014-05-09 DIAGNOSIS — L130 Dermatitis herpetiformis: Secondary | ICD-10-CM

## 2014-05-09 DIAGNOSIS — Z950 Presence of cardiac pacemaker: Secondary | ICD-10-CM

## 2014-05-09 DIAGNOSIS — N400 Enlarged prostate without lower urinary tract symptoms: Secondary | ICD-10-CM

## 2014-05-09 DIAGNOSIS — F3289 Other specified depressive episodes: Secondary | ICD-10-CM | POA: Diagnosis not present

## 2014-05-09 DIAGNOSIS — F329 Major depressive disorder, single episode, unspecified: Secondary | ICD-10-CM | POA: Diagnosis not present

## 2014-05-09 DIAGNOSIS — F0280 Dementia in other diseases classified elsewhere without behavioral disturbance: Secondary | ICD-10-CM

## 2014-05-09 DIAGNOSIS — I252 Old myocardial infarction: Secondary | ICD-10-CM

## 2014-05-09 DIAGNOSIS — Z5189 Encounter for other specified aftercare: Secondary | ICD-10-CM | POA: Diagnosis present

## 2014-05-09 DIAGNOSIS — G20 Parkinson's disease: Secondary | ICD-10-CM | POA: Diagnosis present

## 2014-05-09 DIAGNOSIS — Z7982 Long term (current) use of aspirin: Secondary | ICD-10-CM | POA: Diagnosis not present

## 2014-05-09 DIAGNOSIS — Z9861 Coronary angioplasty status: Secondary | ICD-10-CM | POA: Diagnosis not present

## 2014-05-09 DIAGNOSIS — I482 Chronic atrial fibrillation, unspecified: Secondary | ICD-10-CM

## 2014-05-09 DIAGNOSIS — E785 Hyperlipidemia, unspecified: Secondary | ICD-10-CM

## 2014-05-09 DIAGNOSIS — E41 Nutritional marasmus: Secondary | ICD-10-CM | POA: Diagnosis not present

## 2014-05-09 DIAGNOSIS — N318 Other neuromuscular dysfunction of bladder: Secondary | ICD-10-CM

## 2014-05-09 DIAGNOSIS — R1310 Dysphagia, unspecified: Secondary | ICD-10-CM | POA: Diagnosis not present

## 2014-05-09 DIAGNOSIS — R509 Fever, unspecified: Secondary | ICD-10-CM | POA: Diagnosis not present

## 2014-05-09 LAB — BASIC METABOLIC PANEL
BUN: 8 mg/dL (ref 6–23)
CHLORIDE: 101 meq/L (ref 96–112)
CO2: 27 meq/L (ref 19–32)
CREATININE: 0.8 mg/dL (ref 0.50–1.35)
Calcium: 8.9 mg/dL (ref 8.4–10.5)
GFR calc Af Amer: >90 mL/min (ref >90)
GFR calc non Af Amer: 85 mL/min — ABNORMAL LOW (ref >90)
Glucose, Bld: 92 mg/dL (ref 70–99)
Potassium: 3.7 mEq/L (ref 3.7–5.3)
Sodium: 140 mEq/L (ref 137–147)

## 2014-05-09 LAB — CREATININE, SERUM
CREATININE: 0.63 mg/dL (ref 0.50–1.35)
GFR calc Af Amer: >90 mL/min (ref >90)

## 2014-05-09 LAB — CBC
HCT: 41.7 % (ref 39.0–52.0)
HEMOGLOBIN: 13.2 g/dL (ref 13.0–17.0)
MCH: 27.9 pg (ref 26.0–34.0)
MCHC: 31.7 g/dL (ref 30.0–36.0)
MCV: 88.2 fL (ref 78.0–100.0)
Platelets: 317 10*3/uL (ref 150–400)
RBC: 4.73 MIL/uL (ref 4.22–5.81)
RDW: 14.3 % (ref 11.5–15.5)
WBC: 3.8 10*3/uL — ABNORMAL LOW (ref 4.0–10.5)

## 2014-05-09 LAB — GLUCOSE, CAPILLARY: GLUCOSE-CAPILLARY: 95 mg/dL (ref 70–99)

## 2014-05-09 MED ORDER — ACETAMINOPHEN 325 MG PO TABS: 325.0000 mg | ORAL_TABLET | ORAL | Status: DC | PRN

## 2014-05-09 MED ORDER — NITROGLYCERIN 0.4 MG SL SUBL: 0.4000 mg | SUBLINGUAL_TABLET | SUBLINGUAL | Status: DC | PRN

## 2014-05-09 MED ORDER — DOXYCYCLINE HYCLATE 100 MG PO TABS: 100.0000 mg | ORAL_TABLET | Freq: Two times a day (BID) | ORAL | Status: DC

## 2014-05-09 MED ORDER — DOCUSATE SODIUM 100 MG PO CAPS
100.0000 mg | ORAL_CAPSULE | Freq: Two times a day (BID) | ORAL | Status: DC
  Administered 2014-05-09 – 2014-05-12 (×6): 100 mg via ORAL
  Filled 2014-05-09 (×8): qty 1

## 2014-05-09 MED ORDER — TAMSULOSIN HCL 0.4 MG PO CAPS
0.4000 mg | ORAL_CAPSULE | Freq: Every day | ORAL | Status: DC
  Administered 2014-05-10 – 2014-05-24 (×15): 0.4 mg via ORAL
  Filled 2014-05-09 (×16): qty 1

## 2014-05-09 MED ORDER — CARBIDOPA-LEVODOPA 25-100 MG PO TABS
2.0000 | ORAL_TABLET | Freq: Four times a day (QID) | ORAL | Status: DC
  Administered 2014-05-09 – 2014-05-16 (×27): 2 via ORAL
  Filled 2014-05-09 (×31): qty 2

## 2014-05-09 MED ORDER — BIOTENE DRY MOUTH MT LIQD
15.0000 mL | Freq: Two times a day (BID) | OROMUCOSAL | Status: DC
  Administered 2014-05-09 – 2014-05-24 (×29): 15 mL via OROMUCOSAL

## 2014-05-09 MED ORDER — ENTACAPONE 200 MG PO TABS
200.0000 mg | ORAL_TABLET | Freq: Four times a day (QID) | ORAL | Status: DC
  Administered 2014-05-09 – 2014-05-24 (×59): 200 mg via ORAL
  Filled 2014-05-09 (×63): qty 1

## 2014-05-09 MED ORDER — ESCITALOPRAM OXALATE 10 MG PO TABS
10.0000 mg | ORAL_TABLET | Freq: Every day | ORAL | Status: DC
  Administered 2014-05-10 – 2014-05-19 (×10): 10 mg via ORAL
  Filled 2014-05-09 (×11): qty 1

## 2014-05-09 MED ORDER — DOXYCYCLINE HYCLATE 100 MG PO TABS
100.0000 mg | ORAL_TABLET | Freq: Two times a day (BID) | ORAL | Status: DC
  Administered 2014-05-09 – 2014-05-18 (×18): 100 mg via ORAL
  Filled 2014-05-09 (×20): qty 1

## 2014-05-09 MED ORDER — ONDANSETRON HCL 4 MG/2ML IJ SOLN: 4.0000 mg | Freq: Four times a day (QID) | INTRAMUSCULAR | Status: DC | PRN

## 2014-05-09 MED ORDER — MAGNESIUM OXIDE 400 (241.3 MG) MG PO TABS
400.0000 mg | ORAL_TABLET | Freq: Every day | ORAL | Status: DC
  Administered 2014-05-10 – 2014-05-24 (×15): 400 mg via ORAL
  Filled 2014-05-09 (×16): qty 1

## 2014-05-09 MED ORDER — SORBITOL 70 % SOLN: 30.0000 mL | Freq: Every day | Status: DC | PRN

## 2014-05-09 MED ORDER — ENOXAPARIN SODIUM 40 MG/0.4ML ~~LOC~~ SOLN
40.0000 mg | SUBCUTANEOUS | Status: DC
  Administered 2014-05-09 – 2014-05-23 (×15): 40 mg via SUBCUTANEOUS
  Filled 2014-05-09 (×16): qty 0.4

## 2014-05-09 MED ORDER — ONDANSETRON HCL 4 MG PO TABS: 4.0000 mg | ORAL_TABLET | Freq: Four times a day (QID) | ORAL | Status: DC | PRN

## 2014-05-09 MED ORDER — CARBIDOPA-LEVODOPA 25-100 MG PO TABS
1.0000 | ORAL_TABLET | Freq: Every day | ORAL | Status: DC
  Administered 2014-05-09 – 2014-05-23 (×15): 1 via ORAL
  Filled 2014-05-09 (×16): qty 1

## 2014-05-09 MED ORDER — EZETIMIBE 10 MG PO TABS
10.0000 mg | ORAL_TABLET | Freq: Every day | ORAL | Status: DC
  Administered 2014-05-10 – 2014-05-24 (×15): 10 mg via ORAL
  Filled 2014-05-09 (×16): qty 1

## 2014-05-09 MED ORDER — MIRABEGRON ER 50 MG PO TB24
50.0000 mg | ORAL_TABLET | Freq: Every day | ORAL | Status: DC
  Administered 2014-05-10 – 2014-05-24 (×15): 50 mg via ORAL
  Filled 2014-05-09 (×16): qty 1

## 2014-05-09 MED ORDER — VITAMIN B-12 1000 MCG PO TABS
1000.0000 ug | ORAL_TABLET | Freq: Every day | ORAL | Status: DC
  Administered 2014-05-10 – 2014-05-24 (×15): 1000 ug via ORAL
  Filled 2014-05-09 (×16): qty 1

## 2014-05-09 MED ORDER — ENOXAPARIN SODIUM 40 MG/0.4ML ~~LOC~~ SOLN: 40.0000 mg | SUBCUTANEOUS | Status: DC

## 2014-05-09 MED ORDER — DONEPEZIL HCL 10 MG PO TABS
10.0000 mg | ORAL_TABLET | Freq: Every day | ORAL | Status: DC
  Administered 2014-05-10 – 2014-05-24 (×15): 10 mg via ORAL
  Filled 2014-05-09 (×16): qty 1

## 2014-05-09 MED ORDER — QUETIAPINE FUMARATE 25 MG PO TABS
25.0000 mg | ORAL_TABLET | Freq: Every day | ORAL | Status: DC
  Administered 2014-05-09 – 2014-05-15 (×7): 25 mg via ORAL
  Filled 2014-05-09 (×8): qty 1

## 2014-05-09 MED ORDER — DAPSONE 25 MG PO TABS
25.0000 mg | ORAL_TABLET | Freq: Two times a day (BID) | ORAL | Status: DC
  Administered 2014-05-09 – 2014-05-24 (×30): 25 mg via ORAL
  Filled 2014-05-09 (×36): qty 1

## 2014-05-09 NOTE — PMR Pre-admission (Signed)
PMR Admission Coordinator Pre-Admission Assessment  Patient: [name] is an 73 y.o., male MRN: [id number] DOB: 09-09-1941 Height: 5' 6.93" (170 cm) Weight: 78.019 kg (172 lb)              Insurance Information HMO:No   PPO:       PCP:       IPA:       80/20:       OTHER:   PRIMARY: Medicare A/B      Policy#: [id number]      Subscriber: Vanetta Mulders CM Name:        Phone#:       Fax#:   Pre-Cert#:        Employer: Retired Benefits:  Phone #:       Name: Checked in [place]. Date: 11/11/03     Deduct: $1260      Out of Pocket Max: none      Life Max: unlimited CIR: 100%      SNF: 100 days Outpatient: 80%     Co-Pay: 20% Home Health: 100%      Co-Pay: none DME: 80%     Co-Pay: 20% Providers: patient's choice  SECONDARY: [employer] [place] sup      Policy#: [id number]      Subscriber: Vanetta Mulders CM Name:        Phone#:       Fax#:   Pre-Cert#:        Employer: Retired Benefits:  Phone #: [phone]     Name:   Eff. Date:       Deduct:        Out of Pocket Max:        Life Max:   CIR:        SNF:   Outpatient:       Co-Pay:   Home Health:        Co-Pay:   DME:       Co-Pay:     Emergency Contact Information Contact Information   Name Relation Home Work Mobile   [place] Spouse [phone]  [phone]     Current Medical History  Patient Admitting Diagnosis:  Parkinson's/Fever  History of Present Illness: A 73 y.o. right-handed male with history significant for Parkinson's disease status post deep brain stimulator by Dr. Angelyn Punt neurosurgery at [facility], CAD/pacemaker, dementia. Patient lives with his wife used a rollator prior to admission and goes to the gym to participate in a Parkinson's program. Admitted 05/05/2014 with chills, fever and generalized weakness with increasing confusion. Reports of recent tooth extraction 2 days prior to admission. Chest x-ray negative. Cranial CT scan with no acute intracranial abnormalities with bilateral deep brain  stimulators via high bifrontal Burr holes. Urine study 25,000 colony multi-species. Patient placed on broad-spectrum antibiotics. Blood cultures remain negative. Subcutaneous Lovenox added for DVT prophylaxis. Patient remains on Sinemet for history of Parkinson's disease. Currently maintained on a dysphagia to thin liquid diet. Mental status continues to improve recently placed on Seroquel. Physical therapy evaluation completed 05/07/2014 with recommendations for physical medicine rehabilitation consult.  Pt has had multiple revisions to DBS originally place 2012. Had battery replacement last fall, followed by lead adjustment , then lead replacement in April of this year.     Past Medical History  Past Medical History  Diagnosis Date  . Parkinson disease   . Bradycardia     syncope due to prolonged pauses, resolved with PPM  . Sleep apnea   . Hypertension   .  Dermatitis   . Depression   . Rib fractures 2011    S/p fall  . Maxillary fracture 2009    Right  . BPH (benign prostatic hypertrophy)   . Coronary artery disease 1986    Inferior wall MI treated with Angioplasty  . Normal echocardiogram 2007    Mild LVH with Impaired relaxation, Mild-Mod. Aortic Root dilation, Mild Aortic Sclerosis, Mild MR, Mild TR, Ef-50-55%  . Normal nuclear stress test 2006    Normal EF-54%  . CAD (coronary artery disease)   . Inferior MI   . 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 Maxillary fracture 2009    Right side  . Rib fracture 07/2010  . Atrioventricular block, complete   . Pacemaker   . H/O hiatal hernia     Family History  family history includes Heart attack in his father, mother, and sister; Heart disease in his brother and mother; Hypertension in his mother; Other in his father.  Prior Rehab/Hospitalizations:  Had outpatient 2-3 yrs ago for Parkinson's disease.   Current Medications  Current facility-administered medications:acetaminophen (TYLENOL) suppository 650 mg, 650 mg, Rectal,  Q6H PRN, Adeline C Viyuoh, MD;  acetaminophen (TYLENOL) tablet 650 mg, 650 mg, Oral, Q6H PRN, Adeline C Viyuoh, MD;  antiseptic oral rinse (BIOTENE) solution 15 mL, 15 mL, Mouth Rinse, BID, Adeline C Viyuoh, MD, 15 mL at 05/09/14 0756 carbidopa-levodopa (SINEMET IR) 25-100 MG per tablet immediate release 1 tablet, 1 tablet, Oral, QHS, Kela MillinAdeline C Viyuoh, MD, 1 tablet at 05/08/14 2117;  carbidopa-levodopa (SINEMET IR) 25-100 MG per tablet immediate release 2 tablet, 2 tablet, Oral, QID, Kela MillinAdeline C Viyuoh, MD, 2 tablet at 05/09/14 0917;  dapsone tablet 25 mg, 25 mg, Oral, BID, Adeline C Viyuoh, MD, 25 mg at 05/09/14 0917 docusate sodium (COLACE) capsule 100 mg, 100 mg, Oral, BID, Adeline C Viyuoh, MD, 100 mg at 05/08/14 1044;  donepezil (ARICEPT) tablet 10 mg, 10 mg, Oral, Daily, Adeline C Viyuoh, MD, 10 mg at 05/09/14 0918;  doxycycline (VIBRA-TABS) tablet 100 mg, 100 mg, Oral, Q12H, Adeline C Viyuoh, MD, 100 mg at 05/09/14 0918;  enoxaparin (LOVENOX) injection 40 mg, 40 mg, Subcutaneous, Q24H, Adeline C Viyuoh, MD, 40 mg at 05/06/14 2234 entacapone (COMTAN) tablet 200 mg, 200 mg, Oral, QID, Adeline C Viyuoh, MD, 200 mg at 05/09/14 0917;  escitalopram (LEXAPRO) tablet 10 mg, 10 mg, Oral, Daily, Adeline C Viyuoh, MD, 10 mg at 05/09/14 [id number];  ezetimibe (ZETIA) tablet 10 mg, 10 mg, Oral, Daily, Adeline C Viyuoh, MD, 10 mg at 05/09/14 0917;  magnesium oxide (MAG-OX) tablet 400 mg, 400 mg, Oral, Daily, Adeline C Viyuoh, MD, 400 mg at 05/09/14 0918 mirabegron ER (MYRBETRIQ) tablet 50 mg, 50 mg, Oral, Daily, Adeline C Viyuoh, MD, 50 mg at 05/09/14 0918;  nitroGLYCERIN (NITROSTAT) SL tablet 0.4 mg, 0.4 mg, Sublingual, Q5 min PRN, Adeline C Viyuoh, MD;  ondansetron (ZOFRAN) injection 4 mg, 4 mg, Intravenous, Q6H PRN, Adeline C Viyuoh, MD;  ondansetron (ZOFRAN) tablet 4 mg, 4 mg, Oral, Q6H PRN, Adeline C Viyuoh, MD QUEtiapine (SEROQUEL) tablet 25 mg, 25 mg, Oral, QHS, Adeline C Viyuoh, MD, 25 mg at 05/08/14 2117;  sodium  chloride 0.9 % injection 3 mL, 3 mL, Intravenous, Q12H, Adeline C Viyuoh, MD, 3 mL at 05/09/14 0918;  tamsulosin (FLOMAX) capsule 0.4 mg, 0.4 mg, Oral, Daily, Adeline C Viyuoh, MD, 0.4 mg at 05/09/14 0918;  vitamin B-12 (CYANOCOBALAMIN) tablet 1,000 mcg, 1,000 mcg, Oral, Daily, Kela MillinAdeline C Viyuoh, MD, 1,000  mcg at 05/09/14 1610  Patients Current Diet: Dysphagia  Precautions / Restrictions Precautions Precautions: Fall Restrictions Weight Bearing Restrictions: No   Prior Activity Level Community (5-7x/wk): Went out 3 X a week.  Home Assistive Devices / Equipment Home Assistive Devices/Equipment: CPAP;Walker (specify type) (wheels & seat with brakes) Home Equipment: Walker - 2 wheels;Grab bars - tub/shower;Cane - single point  Prior Functional Level Prior Function Level of Independence: Independent with assistive device(s) Comments: pt normally walks with a rollator, performs transfers and ADLs on his own. he goes to the gym to participate in a Parkinson's program.  Current Functional Level Cognition  Overall Cognitive Status: Impaired/Different from baseline Orientation Level: Oriented to person;Oriented to place;Oriented to time;Disoriented to situation Safety/Judgement: Decreased awareness of safety;Decreased awareness of deficits    Extremity Assessment (includes Sensation/Coordination)  Upper Extremity Assessment: LUE deficits/detail;Generalized weakness  LUE Deficits / Details: LUE with tremor  Lower Extremity Assessment: Generalized weakness  Cervical / Trunk Assessment: Normal;Other exceptions     ADLs  Overall ADL's : Needs assistance/impaired Grooming: Brushing hair;Standing;Minimal assistance;Moderate assistance;Maximal assistance;Oral care;Wash/dry face Upper Body Bathing: Set up;Supervision/ safety;Sitting Lower Body Dressing: Moderate assistance;Sit to/from stand Toilet Transfer: Moderate assistance;Ambulation;RW (chair) Functional mobility during ADLs: Moderate  assistance;Rolling walker General ADL Comments: Recommended pt sit for LB bathing (spoke with wife about recommending shower chair). Pt stood at sink to perform grooming tasks- assist with balance.    Mobility  Overal bed mobility: Needs Assistance Bed Mobility: Sit to Supine Supine to sit: HOB elevated;Min assist Sit to supine: Mod assist General bed mobility comments: cues for sequence with assist to elevate legs and control descent of trunk    Transfers  Overall transfer level: Needs assistance Equipment used: Rolling walker (2 wheeled) Transfers: Sit to/from Stand Sit to Stand: Mod assist General transfer comment: Cues for technique and hand placement. Pt using momentum to stand with significant posterior push and bias requiring assist to prevent fall and translate anteriorly    Ambulation / Gait / Stairs / Wheelchair Mobility  Ambulation/Gait Ambulation/Gait assistance: Mod assist Ambulation Distance (Feet): 5 Feet Assistive device: Rolling walker (2 wheeled) Gait Pattern/deviations: Shuffle;Trunk flexed;Leaning posteriorly Gait velocity interpretation: Below normal speed for age/gender General Gait Details: pt with posterior right lean with assist for balance, very unsteady gait with pt unable to keep eyes open with gait and turned back to bed     Posture / Balance Overall balance assessment: Needs assistance  Sitting balance-Leahy Scale: Poor  Standing balance-Leahy Scale: Poor   Special needs/care consideration BiPAP/CPAP Yes, has CPAP CPM No Continuous Drip IV No Dialysis No        Life Vest No Oxygen No Special Bed No Trach Size No Wound Vac (area) No     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), but under control with medication                             Bowel mgmt: Last BM 05/08/14 Bladder mgmt: Has a condom catheter Diabetic mgmt No    Previous Home Environment Living Arrangements: Spouse/significant other Available Help at  Discharge: Family;Personal care attendant;Available 24 hours/day Type of Home: House Home Layout: One level Home Access: Stairs to enter Entrance Stairs-Rails: Right Entrance Stairs-Number of Steps: 2 Bathroom Shower/Tub: Walk-in shower Home Care Services: Yes Type of Home Care Services: Other (Comment) (private help assist with dressing) Home Care Agency (if known): private  Discharge Living Setting Plans for Discharge Living  Setting: Patient's home;House;Lives with (comment) (Lives with wife.) Type of Home at Discharge: House Discharge Home Layout: One level Discharge Home Access: Stairs to enter Entrance Stairs-Rails: Right (Rail has been reinforced for safety.) Entrance Stairs-Number of Steps: 4 steps garage entry Does the patient have any problems obtaining your medications?: No  Social/Family/Support Systems Patient Roles: Spouse;Parent (Has a wife and a daughter.) Contact Information: Fredric Slabach - wife Anticipated Caregiver: wife and an aide who works 6 hrs 2 days a week. Anticipated Caregiver's Contact Information: Consuella Lose - wife (h) [phone] (c) [phone] Ability/Limitations of Caregiver: wife can assist and has a hired caregiver 2 days a week. Caregiver Availability: 24/7 Discharge Plan Discussed with Primary Caregiver: Yes Is Caregiver In Agreement with Plan?: Yes Does Caregiver/Family have Issues with Lodging/Transportation while Pt is in Rehab?: No  Goals/Additional Needs Patient/Family Goal for Rehab: PT/OT/ST min assist goals Expected length of stay: 10-14 days Cultural Considerations: Attends a [employer] on Sundays Dietary Needs: Dys 2, thin liquids Equipment Needs: TBD Pt/Family Agrees to Admission and willing to participate: Yes Program Orientation Provided & Reviewed with Pt/Caregiver Including Roles  & Responsibilities: Yes  Decrease burden of Care through IP rehab admission: N/A  Possible need for SNF placement upon discharge: Not  planned  Patient Condition: This patient's condition remains as documented in the consult dated 05/08/14, in which the Rehabilitation Physician determined and documented that the patient's condition is appropriate for intensive rehabilitative care in an inpatient rehabilitation facility. Will admit to inpatient rehab today.  Preadmission Screen Completed By:  Trish Mage, 05/09/2014 11:47 AM ______________________________________________________________________   Discussed status with Dr. Wynn Banker on 05/09/14 at 1200 and received telephone approval for admission today.  Admission Coordinator:  Trish Mage, time1200/Date06/30/15

## 2014-05-09 NOTE — H&P (View-Only) (Signed)
Physical Medicine and Rehabilitation Admission H&P  Chief Complaint   Patient presents with   .  Weakness   :  HPI: [name] is a 73 y.o. right-handed male with history significant for Parkinson's disease status post deep brain stimulator by Dr. Salomon Fick neurosurgery at [facility], CAD/pacemaker, dementia. Patient lives with his wife used a rollator prior to admission and goes to the gym to participate in a Parkinson's program. Admitted 05/05/2014 with chills, fever and generalized weakness with increasing confusion. Reports of recent tooth extraction 2 days prior to admission. Chest x-ray negative. Cranial CT scan with no acute intracranial abnormalities with bilateral deep brain stimulators via high bifrontal Burr holes. Urine study 25,000 colony multi-species. Patient placed on broad-spectrum antibiotics and since changed to doxycycline. Blood cultures remain negative. Panorex negative for infection no reports of abscess. Subcutaneous Lovenox added for DVT prophylaxis. Patient remains on Sinemet for history of Parkinson's disease. Seroquel added each bedtime for bouts of restlessness. Currently maintained on a dysphagia 2 thin liquid diet. Marland Kitchen Physical therapy evaluation completed 05/07/2014 with recommendations for physical medicine rehabilitation consult. Patient was admitted for comprehensive rehabilitation program  ROS Review of Systems  Constitutional: Positive for fever.  Musculoskeletal: Positive for falls.  Neurological: Positive for speech change and weakness.  Psychiatric/Behavioral: Positive for depression and memory loss.  All other systems reviewed and are negative  Past Medical History   Diagnosis  Date   .  Parkinson disease    .  Bradycardia      syncope due to prolonged pauses, resolved with PPM   .  Sleep apnea    .  Hypertension    .  Dermatitis    .  Depression    .  Rib fractures  2011     S/p fall   .  Maxillary fracture  2009     Right   .  BPH (benign  prostatic hypertrophy)    .  Coronary artery disease  1986     Inferior wall MI treated with Angioplasty   .  Normal echocardiogram  2007     Mild LVH with Impaired relaxation, Mild-Mod. Aortic Root dilation, Mild Aortic Sclerosis, Mild MR, Mild TR, Ef-50-55%   .  Normal nuclear stress test  2006     Normal EF-54%   .  CAD (coronary artery disease)    .  Inferior MI    .  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  Maxillary fracture  2009     Right side   .  Rib fracture  07/2010   .  Atrioventricular block, complete    .  Pacemaker    .  H/O hiatal hernia     Past Surgical History   Procedure  Laterality  Date   .  Pacemaker insertion   10/28/10     MDT implanted by Dr Lovena Le   .  Deep brain stimulator placement       Parkinsons   .  Arthroscopic knee       Left knee   .  Cataract surgery       Bilateral   .  Inguinal hernia repair       Bilateral   .  Cardiac catheterization       Multiple Angioplasties, last 2002   .  Removal of ganglion cyst       right wrist   .  Insertion / placement / revision neurostimulator     .  Eye surgery     .  Insert / replace / remove pacemaker      Family History   Problem  Relation  Age of Onset   .  Heart attack  Mother    .  Heart disease  Mother    .  Hypertension  Mother    .  Heart attack  Father    .  Other  Father      renal calculi   .  Heart disease  Brother    .  Heart attack  Sister     Social History: reports that he has never smoked. He does not have any smokeless tobacco history on file. He reports that he does not drink alcohol or use illicit drugs.  Allergies:  Allergies   Allergen  Reactions   .  Crestor [Rosuvastatin Calcium]  Other (See Comments)     Muscle weakness   .  Bactrim  Rash   .  Gluten  Rash   .  Sulfamethoxazole-Trimethoprim  Rash    Medications Prior to Admission   Medication  Sig  Dispense  Refill   .  aspirin 81 MG tablet  Take 81 mg by mouth daily.     Marland Kitchen  CALCIUM-VITAMIN D PO  Take 1  tablet by mouth daily. $RemoveBefo'400mg'LpVfbcZsvCl$  of calcium and 1000units of D3     .  carbidopa-levodopa (SINEMET) 25-100 MG per tablet  Take 1 tablet by mouth at bedtime.     .  Carbidopa-Levodopa-Entacapone (STALEVO 200 PO)  Take 200 mg by mouth 4 (four) times daily.     .  dapsone 25 MG tablet  Take 25 mg by mouth 2 (two) times daily.     Marland Kitchen  donepezil (ARICEPT) 10 MG tablet  Take 10 mg by mouth daily.     Marland Kitchen  escitalopram (LEXAPRO) 10 MG tablet  Take 10 mg by mouth daily.     Marland Kitchen  ezetimibe (ZETIA) 10 MG tablet  Take 10 mg by mouth daily.     .  Magnesium 400 MG CAPS  Take 400 mg by mouth daily.     .  mirabegron ER (MYRBETRIQ) 50 MG TB24 tablet  Take 50 mg by mouth daily.     .  nitroGLYCERIN (NITROSTAT) 0.4 MG SL tablet  Place 0.4 mg under the tongue every 5 (five) minutes as needed for chest pain.     Marland Kitchen  QUEtiapine (SEROQUEL) 25 MG tablet  Take 25 mg by mouth at bedtime.     .  Tamsulosin HCl (FLOMAX) 0.4 MG CAPS  Take 0.4 mg by mouth daily.     .  vitamin B-12 (CYANOCOBALAMIN) 1000 MCG tablet  Take 1,000 mcg by mouth daily.      Home:  Home Living  Family/patient expects to be discharged to:: Private residence  Living Arrangements: Spouse/significant other  Available Help at Discharge: Family;Personal care attendant;Available 24 hours/day  Type of Home: House  Home Access: Stairs to enter  [employer] of Steps: 2  Entrance Stairs-Rails: Right  Home Layout: One level  Home Equipment: Walker - 2 wheels;Grab bars - tub/shower;Cane - single point  Functional History:  Prior Function  Level of Independence: Independent with assistive device(s)  Comments: pt normally walks with a rollator, performs transfers and ADLs on his own. he goes to the gym to participate in a Parkinson's program.  Functional Status:  Mobility:  Bed Mobility  Overal bed mobility: Needs Assistance  Bed Mobility: Supine to Sit  Supine to sit: HOB elevated;Min assist  General bed mobility comments: not assessed   Transfers  Overall transfer level: Needs assistance  Equipment used: Rolling walker (2 wheeled)  Transfers: Sit to/from Stand  Sit to Stand: Mod assist  General transfer comment: Cues for technique and hand placement. Pt using momentum to stand.  Ambulation/Gait  Ambulation/Gait assistance: Mod assist  Ambulation Distance (Feet): 15 Feet  Assistive device: Rolling walker (2 wheeled)  Gait Pattern/deviations: Narrow base of support;Shuffle;Ataxic;Scissoring;Leaning posteriorly  Gait velocity interpretation: Below normal speed for age/gender  General Gait Details: pt with posterior right lean with assist for balance, clearing obstacles and pt periodically picking up RW despite cues. Pt scissoring x 2 and generally unsafe and unsteady with gait   ADL:  ADL  Overall ADL's : Needs assistance/impaired  Grooming: Brushing hair;Standing;Minimal assistance;Moderate assistance;Maximal assistance;Oral care;Wash/dry face  Upper Body Bathing: Set up;Supervision/ safety;Sitting  Lower Body Dressing: Moderate assistance;Sit to/from stand  Toilet Transfer: Moderate assistance;Ambulation;RW (chair)  Functional mobility during ADLs: Moderate assistance;Rolling walker  General ADL Comments: Recommended pt sit for LB bathing (spoke with wife about recommending shower chair). Pt stood at sink to perform grooming tasks- assist with balance.  Cognition:  Cognition  Overall Cognitive Status: Impaired/Different from baseline  Orientation Level: Oriented to person;Oriented to place;Oriented to time;Disoriented to situation  Cognition  Arousal/Alertness: Awake/alert  Behavior During Therapy: WFL for tasks assessed/performed  Overall Cognitive Status: Impaired/Different from baseline  Area of Impairment: Orientation;Safety/judgement  Orientation Level: Disoriented to;Place  Safety/Judgement: Decreased awareness of safety  Problem Solving: Slow processing  Physical Exam:  Blood pressure 120/64, pulse 77,  temperature 97.6 F (36.4 C), temperature source Oral, resp. rate 18, height 5' 6.93" (1.7 m), weight 78.019 kg (172 lb), SpO2 93.00%.  Physical Exam  Vitals reviewed.  HENT:  Head: Normocephalic.  Eyes: EOM are normal.  Neck: Normal range of motion. Neck supple. No thyromegaly present.  Cardiovascular: Normal rate and regular rhythm.  Respiratory: Effort normal and breath sounds normal. No respiratory distress.  GI: Soft. Bowel sounds are normal. He exhibits no distension.  Neurological:  Alert male in no acute distress. Speech is a bit halting but intelligible. He was able to provide his name and date of birth. Limited medical historian  Skin: Skin is warm and dry.  motor 5/5 in B UE and BLE  chorea noted LUE  Cerebellar intact  No cogwheeling  Results for orders placed during the hospital encounter of 05/05/14 (from the past 48 hour(s))   GLUCOSE, CAPILLARY Status: Abnormal    Collection Time    05/07/14 12:15 PM   Result  Value  Ref Range    Glucose-Capillary  111 (*)  70 - 99 mg/dL   VANCOMYCIN, TROUGH Status: None    Collection Time    05/07/14 4:10 PM   Result  Value  Ref Range    Vancomycin Tr  10.3  10.0 - 20.0 ug/mL   CBC Status: Abnormal    Collection Time    05/08/14 5:53 AM   Result  Value  Ref Range    WBC  4.4  4.0 - 10.5 K/uL    RBC  4.65  4.22 - 5.81 MIL/uL    Hemoglobin  12.9 (*)  13.0 - 17.0 g/dL    HCT  41.0  39.0 - 52.0 %    MCV  88.2  78.0 - 100.0 fL    MCH  27.7  26.0 - 34.0 pg    MCHC  31.5  30.0 -  36.0 g/dL    RDW  35.6  86.1 - 68.3 %    Platelets  285  150 - 400 K/uL   BASIC METABOLIC PANEL Status: Abnormal    Collection Time    05/08/14 5:53 AM   Result  Value  Ref Range    Sodium  139  137 - 147 mEq/L    Potassium  4.1  3.7 - 5.3 mEq/L    Chloride  101  96 - 112 mEq/L    CO2  26  19 - 32 mEq/L    Glucose, Bld  101 (*)  70 - 99 mg/dL    BUN  9  6 - 23 mg/dL    Creatinine, Ser  7.29  0.50 - 1.35 mg/dL    Calcium  8.8  8.4 - 10.5 mg/dL     GFR calc non Af Amer  89 (*)  >90 mL/min    GFR calc Af Amer  >90  >90 mL/min    Comment:  (NOTE)     The eGFR has been calculated using the CKD EPI equation.     This calculation has not been validated in all clinical situations.     eGFR's persistently <90 mL/min signify possible Chronic Kidney     Disease.   BASIC METABOLIC PANEL Status: Abnormal    Collection Time    05/09/14 5:35 AM   Result  Value  Ref Range    Sodium  140  137 - 147 mEq/L    Potassium  3.7  3.7 - 5.3 mEq/L    Chloride  101  96 - 112 mEq/L    CO2  27  19 - 32 mEq/L    Glucose, Bld  92  70 - 99 mg/dL    BUN  8  6 - 23 mg/dL    Creatinine, Ser  0.21  0.50 - 1.35 mg/dL    Calcium  8.9  8.4 - 10.5 mg/dL    GFR calc non Af Amer  85 (*)  >90 mL/min    GFR calc Af Amer  >90  >90 mL/min    Comment:  (NOTE)     The eGFR has been calculated using the CKD EPI equation.     This calculation has not been validated in all clinical situations.     eGFR's persistently <90 mL/min signify possible Chronic Kidney     Disease.    No results found.  Medical Problem List and Plan:  1. Functional deficits secondary to Parkinson's disease with decline in functional status after fever of unknown etiology. Continue Sinemet as directed for Parkinson disease  2. DVT Prophylaxis/Anticoagulation: Lovenox. Monitor platelet counts and any signs of bleeding  3. Pain Management: Tylenol as needed  4. Mood/dementia: Aricept 10 mg daily, Lexapro 10 mg daily, Seroquel 25 mg each bedtime. Bed alarm for safety discuss cognitive baseline with family  5. Neuropsych: This patient is not capable of making decisions on his own behalf.  6. Dysphagia. Dysphagia 2 thin liquid diet  7. Recent tooth extraction. Continue doxycycline for now  8. Overactive bladder..Myrbetriq 50 mg daily, Flomax 0.4 mg daily. Check PVRs x3  9. Hyperlipidemia. Zetia  Post Admission Physician Evaluation:  1. Functional deficits secondary to . 2. Patient is admitted to  receive collaborative, interdisciplinary care between the physiatrist, rehab nursing staff, and therapy team. 3. Patient's level of medical complexity and substantial therapy needs in context of that medical necessity cannot be provided at a lesser intensity of care  such as a SNF. 4. Patient has experienced substantial functional loss from his/her baseline which was documented above under the "Functional History" and "Functional Status" headings. Judging by the patient's diagnosis, physical exam, and functional history, the patient has potential for functional progress which will result in measurable gains while on inpatient rehab. These gains will be of substantial and practical use upon discharge in facilitating mobility and self-care at the household level. 5. Physiatrist will provide 24 hour management of medical needs as well as oversight of the therapy plan/treatment and provide guidance as appropriate regarding the interaction of the two. 6. 24 hour rehab nursing will assist with bladder management, bowel management, safety, skin/wound care, disease management, medication administration, pain management and patient education and help integrate therapy concepts, techniques,education, etc. 7. PT will assess and treat for/with: pre gait, gait training, endurance , safety, equipment, neuromuscular re education. Goals are: sup/minA. 8. OT will assess and treat for/with: ADLs, Cognitive perceptual skills, Neuromuscular re education, safety, endurance, equipment. Goals are: min/sup. 9. SLP will assess and treat for/with: swallow, cognition. Goals are: safe po intake, Sup med management. 10. Case Management and Social Worker will assess and treat for psychological issues and discharge planning. 11. Team conference will be held weekly to assess progress toward goals and to determine barriers to discharge. 12. Patient will receive at least 3 hours of therapy per day at least 5 days per week. 13. ELOS: 10-14   14. Prognosis: good Charlett Blake M.D. [facility] FAAPM&R (Sports Med, Neuromuscular Med) Diplomate Am Board of Electrodiagnostic Med   05/09/2014

## 2014-05-09 NOTE — Progress Notes (Signed)
Nursing Note: Pt became agitated and would not stay in bed. Pt encouraged to let us change him and then he could get up but would not agree. Pt states he will not and cursing and pushing against staff. Pt sitting at the foot od the bed for 10 minutes and still would not comply.Security paged and after a few minutes pt agreed and got back in bed.Wife at bedside and trying to calm pt w/o success.Paged on-call.Pam Love and obtained order to give night meds now.wbb

## 2014-05-09 NOTE — Progress Notes (Signed)
Rehab admissions - Evaluated for possible admission.  I spoke with patient who is confused.  I then spoke with his wife.  Wife would like inpatient rehab admission.  Bed available and will admit to acute inpatient rehab today.  Call me for questions.  #[phone]

## 2014-05-09 NOTE — Progress Notes (Signed)
Physical Therapy Treatment Patient Details Name: [name] BogusRobert J [name] MRN: [id number] DOB: Apr 09, 1941 Today's Date: 05/09/2014    History of Present Illness [name] is a 73 y.o. male past medical history significant for Parkinson's disease status post deep brain stimulator>> followed by Dr. Beaulah [facility]inatter[facility], hypertension, CAD, dementia who presents with fever and confusion. Pt with recent replacement of stimulator and battery as well as tooth extraction on 6/24    PT Comments    Pt lethargic on arrival, spouse not present and pt states he slept well but just feels weak and tired. Pt with difficulty maintaining arousal during session today with balance and transfers even worse than on eval. RN reports nothing new given as far as meds. Pt unable to tolerate further activity at this time and will continue to follow.   Follow Up Recommendations  CIR;Supervision/Assistance - 24 hour     Equipment Recommendations       Recommendations for Other Services       Precautions / Restrictions Precautions Precautions: Fall Restrictions Weight Bearing Restrictions: No    Mobility  Bed Mobility   Bed Mobility: Sit to Supine       Sit to supine: Mod assist   General bed mobility comments: cues for sequence with assist to elevate legs and control descent of trunk  Transfers     Transfers: Sit to/from Stand Sit to Stand: Mod assist         General transfer comment: Cues for technique and hand placement. Pt using momentum to stand with significant posterior push and bias requiring assist to prevent fall and translate anteriorly  Ambulation/Gait Ambulation/Gait assistance: Mod assist Ambulation Distance (Feet): 5 Feet Assistive device: Rolling walker (2 wheeled) Gait Pattern/deviations: Shuffle;Trunk flexed;Leaning posteriorly   Gait velocity interpretation: Below normal speed for age/gender General Gait Details: pt with posterior right lean with assist for  balance, very unsteady gait with pt unable to keep eyes open with gait and turned back to bed    Stairs            Wheelchair Mobility    Modified Rankin (Stroke Patients Only)       Balance Overall balance assessment: Needs assistance   Sitting balance-Leahy Scale: Poor       Standing balance-Leahy Scale: Poor                      Cognition Arousal/Alertness: Lethargic Behavior During Therapy: Flat affect Overall Cognitive Status: Impaired/Different from baseline Area of Impairment: Orientation;Safety/judgement Orientation Level: Time   Memory: Decreased short-term memory   Safety/Judgement: Decreased awareness of safety;Decreased awareness of deficits   Problem Solving: Slow processing;Difficulty sequencing;Requires verbal cues;Requires tactile cues      Exercises      General Comments        Pertinent Vitals/Pain No pain HR 77 sats 92% on RA    Home Living                      Prior Function            PT Goals (current goals can now be found in the care plan section) Progress towards PT goals: Not progressing toward goals - comment (limited by lethargy today, rN aware all vitals stable)    Frequency       PT Plan Current plan remains appropriate    Co-evaluation             End of Session Equipment Utilized  During Treatment: Gait belt Activity Tolerance: Patient limited by lethargy Patient left: in bed;with call bell/phone within reach;with bed alarm set;with nursing/sitter in room     Time: 2376-2831 PT Time Calculation (min): 18 min  Charges:  $Therapeutic Activity: 8-22 mins                    G Codes:      Delorse Lek 2014/05/16, 10:51 AM Delaney Meigs, PT [phone]

## 2014-05-09 NOTE — Progress Notes (Signed)
Occupational Therapy Treatment Patient Details Name: [name] MRN: [id number] DOB: 1941/02/06 Today's Date: 05/09/2014    History of present illness [name] is a 73 y.o. male past medical history significant for Parkinson's disease status post deep brain stimulator>> followed by Dr. Beaulah Corin at [facility], hypertension, CAD, dementia who presents with fever and confusion. Pt with recent replacement of stimulator and battery as well as tooth extraction on 6/24   OT comments  Pt making slow progress with functional goals. Pt lethargic today and required verbal and physical cues to initiate bed mobility and sit - stand for [facility] transfer.  Follow Up Recommendations  CIR    Equipment Recommendations  None recommended by OT;Other (comment) (TBD at next venue of care)    Recommendations for Other Services      Precautions / Restrictions Precautions Precautions: Fall Restrictions Weight Bearing Restrictions: No       Mobility Bed Mobility Overal bed mobility: Needs Assistance Bed Mobility: Sit to Supine     Supine to sit: HOB elevated;Min assist Sit to supine: Mod assist   General bed mobility comments: cues for initiation and to sequence with assist to elevate legs and control descent of trunk  Transfers Overall transfer level: Needs assistance Equipment used: Rolling walker (2 wheeled) Transfers: Sit to/from Stand Sit to Stand: Mod assist         General transfer comment: Cues for technique and hand placement. Pt using momentum to stand with significant posterior push and bias requiring assist to prevent fall and translate anteriorly    Balance Overall balance assessment: Needs assistance Sitting-balance support: Single extremity supported;Feet supported Sitting balance-Leahy Scale: Poor     Standing balance support: Bilateral upper extremity supported;During functional activity Standing balance-Leahy Scale: Poor                      ADL       Grooming: Minimal assistance;Moderate assistance;Wash/dry face;Sitting;Wash/dry hands;Cueing for sequencing   Upper Body Bathing: Sitting;Minimal assitance;Cueing for sequencing           Lower Body Dressing: Moderate assistance;Sitting/lateral leans;Cueing for sequencing   Toilet Transfer: Moderate assistance;RW;BSC;Cueing for sequencing;Cueing for safety           Functional mobility during ADLs: Moderate assistance;Rolling walker General ADL Comments: Cues for initiation and sequencing      Vision  wears glasses                   Perception Perception Perception Tested?: No   Praxis Praxis Praxis tested?: Not tested    Cognition   Behavior During Therapy: Flat affect Overall Cognitive Status: Impaired/Different from baseline Area of Impairment: Orientation;Safety/judgement Orientation Level: Time   Memory: Decreased short-term memory    Safety/Judgement: Decreased awareness of safety;Decreased awareness of deficits   Problem Solving: Slow processing;Difficulty sequencing;Requires verbal cues;Requires tactile cues                                 General Comments  pt pleasant and cooperative    Pertinent Vitals/ Pain       No c/o pain, VSS  Frequency Min 2X/week     Progress Toward Goals  OT Goals(current goals can now be found in the care plan section)  Progress towards OT goals: Progressing toward goals     Plan Discharge plan remains appropriate                     End of Session Equipment Utilized During Treatment: Gait belt;Rolling walker;Other (comment) (BSC)   Activity Tolerance Patient limited by lethargy   Patient Left with call bell/phone within reach;in bed;with bed alarm set             Time: 1052-1106 OT Time Calculation (min): 14 min  Charges: OT General Charges $OT Visit: 1 Procedure OT  Treatments $Therapeutic Activity: 8-22 mins  Galen ManilaSpencer, Denise Jeanette 05/09/2014, 1:44 PM

## 2014-05-09 NOTE — Progress Notes (Signed)
Patient admitted from 36W.  Patient and wife oriented to room and unit.  Patient denies pain; vitals stable.  Will continue to monitor.

## 2014-05-09 NOTE — Clinical Social Work Psychosocial (Signed)
Clinical Social Work Department BRIEF PSYCHOSOCIAL ASSESSMENT 05/09/2014  Patient:  RONDO, SPITTLER     Account Number:  [id number]     Admit date:  05/05/2014  Clinical Social Worker:  Lovey Newcomer  Date/Time:  05/08/2014 02:00 PM  Referred by:  Physician  Date Referred:  05/09/2014 Referred for  SNF Placement   Other Referral:   Interview type:  Family Other interview type:   Patient and family interviewed at bedside.    PSYCHOSOCIAL DATA Living Status:  WIFE Admitted from facility:   Level of care:   Primary support name:  [name] Primary support relationship to patient:  SPOUSE Degree of support available:   Support is good.    CURRENT CONCERNS Current Concerns  Post-Acute Placement   Other Concerns:    SOCIAL WORK ASSESSMENT / PLAN CSW met with patient and wife at bedside to complete assessment. Wife and patient state that they are hoping that patient will be admitted to CIR. CSW explained the need for a SNF backup plan in case CIR is not an option. Family is agreeable to this plan. CSW explained SNF search/placement process and answered family's questions.   Assessment/plan status:  Psychosocial Support/Ongoing Assessment of Needs Other assessment/ plan:   Complete FL2, Fax, PASRR   Information/referral to community resources:   CSW Orthoptist given.    PATIENT'S/FAMILY'S RESPONSE TO PLAN OF CARE: Patient and wife agreeable to SNF backup plan in the event CIR is not able to accept patient. CSW will follow up with bed offers when available.       Liz Beach MSW, [place], [place], [phone]

## 2014-05-09 NOTE — Progress Notes (Signed)
Report given to 4W Rehab RN

## 2014-05-09 NOTE — Interval H&P Note (Signed)
[name] was admitted today to Inpatient Rehabilitation with the diagnosis of Parkinson's disease.  The patient's history has been reviewed, patient examined, and there is no change in status.  Patient continues to be appropriate for intensive inpatient rehabilitation.  I have reviewed the patient's chart and labs.  Questions were answered to the patient's satisfaction.  Erick Colace 05/09/2014, 5:03 PM

## 2014-05-09 NOTE — Progress Notes (Signed)
CARE MANAGEMENT NOTE 05/09/2014  Patient:  TAYVEN, FURE   Account Number:  [id number]  Date Initiated:  05/09/2014  Documentation initiated by:  [facility]  Subjective/Objective Assessment:   admitted with fever, probable pneumonia     Action/Plan:   PT/OT recommene CIR   Anticipated DC Date:  05/09/2014   Anticipated DC Plan:  IP REHAB FACILITY      DC Planning Services  CM consult      Choice offered to / List presented to:             Status of service:  Completed, signed off Medicare Important Message given?  YES (If response is "NO", the following Medicare IM given date fields will be blank) Date Medicare IM given:  05/09/2014 Medicare IM given by:  [facility]

## 2014-05-09 NOTE — Discharge Summary (Signed)
Physician Discharge Summary  [name] YNW:[id number] DOB: Mar 22, 1941 DOA: 05/05/2014  PCP: Neena Rhymes, MD  Admit date: 05/05/2014 Discharge date: 05/09/2014  Time spent: >30 minutes  Recommendations for Outpatient Follow-up:  Transfer to CIR for further rehabilitation Followup with neurosurgeon/Dr. Angelyn Punt upon discharge from rehabilitation  Discharge Diagnoses:  Active Problems:   Atrial fibrillation   CAD (coronary artery disease)   Dementia   Idiopathic Parkinson's disease   Fever  probable pneumonia-possibly aspiration related Probable cellulitis L.chest wall area/ DBS pocket  Discharge Condition: Improved/stable  Diet recommendation: Dysphagia 1 diet with thin liquids  Filed Weights   05/05/14 1806  Weight: 78.019 kg (172 lb)    History of present illness:  [name] is a 73 y.o. male past medical history significant for Parkinson's disease status post deep brain stimulator>> followed by Dr. Beaulah Corin at [facility], hypertension, CAD, dementia who presents with above complaints. The history is obtained from wife at bedside with patient contributing. She reports that he's had chills since yesterday, with generalized weakness and a little more confused than baseline. Also the report that he has been coughing with meals. It is also noted that he had a tooth extraction 2 days ago per his dentist. His wife also relates that about a month ago at the noted some redness in his left upper chest area>> the site of the deep brain stimulator, and he was also complaining of increased pain there. She states and she took him to [facility] and was seen by Dr. Allene Pyo PA and at the time they were told about that it was not infected. Per wife the redness has remained unchanged and patient continues to have pain in that area. He was seen in the ED temperature 101.7, WBC 7.8. Urinalysis was negative for infection, and chest x-ray showed no acute infiltrates. Dr. Adonis Brook  discussed patient with Dr. Angelyn Punt at [facility] and he stated that patient was able to stay [facility]. Blood and urine cultures were obtained in EDP, and he was started on empiric Vanc & Zosyn and is admitted for further evaluation and management. Lactic acid level was normal at 1.03, and is hemodynamically stable.   Hospital Course:  Active Problems:  Fever/Probable PNA  - early  pneumonia vs possible DBS pocket cellulitis vs dental infection  -As discussed above upon admission initial x-rays did not show acute infiltrates, follow up  cxr showed possible Left airspace disease. Also the area over his left chest wall/DBS pocket showed findings consistent with cellulitis -On admission blood and urine cultures were obtained and he was started on empiric antibiotics  -Speech therapy evaluated patient for dysphagia and he was placed on dysphagia to thin liquids which is to continue upon discharge. -panorex was obtained given his history of tooth extraction , and came back neg for infection, no reports of abscess  -Patient defervesced and has remained hemodynamically stable -His blood cultures to date show no growth Acute encephalopathy in patient with dementia, urine cultures only with multiple bacterial morphotypes -As he improved clinically he was changed over to oral antibiotics which he'll continue upon discharge to complete the treatment course. -Likely secondary to above  -improving withTreatment as above, continue Seroquel and follow  Atrial fibrillation-status post pacemaker  -Rate controlled, continue outpatient medications  -Continue aspirin  CAD (coronary artery disease)  -He is chest pain-free, continue outpatient medications  Dementia  -Continue Seroquel  Idiopathic Parkinson's disease  -Continue outpatient medications  -Possible cellulitis over DBS pocket as noted above>> clinically improved>>  patient is to follow up with Dr. Marcelline Mates upon discharge. Deconditioning  -PTOT was  consulted evaluated patient and recommended CIR -He is medically ready for discharge   Procedures:  None  Consultations:  None  Discharge Exam: Filed Vitals:   05/09/14 1048  BP:   Pulse: 77  Temp:   Resp:    Exam:  General: alert & oriented x 3 In NAD, intermittent involuntary mov'ts  Cardiovascular: RRR, nl S1 s2; no erythema over DBS in L. Chest wall  Respiratory: decreased BS at bases, no wheezes  Abdomen: soft +BS NT/ND, no masses palpable  Extremities: No cyanosis and no edema   Discharge Instructions You were cared for by a hospitalist during your hospital stay. If you have any questions about your discharge medications or the care you received while you were in the hospital after you are discharged, you can call the unit and asked to speak with the hospitalist on call if the hospitalist that took care of you is not available. Once you are discharged, your primary care physician will handle any further medical issues. Please note that NO REFILLS for any discharge medications will be authorized once you are discharged, as it is imperative that you return to your primary care physician (or establish a relationship with a primary care physician if you do not have one) for your aftercare needs so that they can reassess your need for medications and monitor your lab values.  Discharge Instructions   DIET DYS 2    Complete by:  As directed      Increase activity slowly    Complete by:  As directed             Medication List         aspirin 81 MG tablet  Take 81 mg by mouth daily.     CALCIUM-VITAMIN D PO  Take 1 tablet by mouth daily. 400mg  of calcium and 1000units of D3     carbidopa-levodopa 25-100 MG per tablet  Commonly known as:  SINEMET IR  Take 1 tablet by mouth at bedtime.     dapsone 25 MG tablet  Take 25 mg by mouth 2 (two) times daily.     donepezil 10 MG tablet  Commonly known as:  ARICEPT  Take 10 mg by mouth daily.     doxycycline 100 MG  tablet  Commonly known as:  VIBRA-TABS  Take 1 tablet (100 mg total) by mouth every 12 (twelve) hours.     escitalopram 10 MG tablet  Commonly known as:  LEXAPRO  Take 10 mg by mouth daily.     ezetimibe 10 MG tablet  Commonly known as:  ZETIA  Take 10 mg by mouth daily.     FLOMAX 0.4 MG Caps capsule  Generic drug:  tamsulosin  Take 0.4 mg by mouth daily.     Magnesium 400 MG Caps  Take 400 mg by mouth daily.     MYRBETRIQ 50 MG Tb24 tablet  Generic drug:  mirabegron ER  Take 50 mg by mouth daily.     nitroGLYCERIN 0.4 MG SL tablet  Commonly known as:  NITROSTAT  Place 0.4 mg under the tongue every 5 (five) minutes as needed for chest pain.     QUEtiapine 25 MG tablet  Commonly known as:  SEROQUEL  Take 25 mg by mouth at bedtime.     STALEVO 200 PO  Take 200 mg by mouth 4 (four) times daily.  vitamin B-12 1000 MCG tablet  Commonly known as:  CYANOCOBALAMIN  Take 1,000 mcg by mouth daily.       Allergies  Allergen Reactions  . Crestor [Rosuvastatin Calcium] Other (See Comments)    Muscle weakness   . Bactrim Rash  . Gluten Rash  . Sulfamethoxazole-Trimethoprim Rash      The results of significant diagnostics from this hospitalization (including imaging, microbiology, ancillary and laboratory) are listed below for reference.    Significant Diagnostic Studies: Dg Orthopantogram  05/06/2014   CLINICAL DATA:  73 year old male with fever, recent tooth extraction. Initial encounter.  EXAM: ORTHOPANTOGRAM/PANORAMIC  COMPARISON:  Head CT without contrast 05/05/2014.  FINDINGS: Motion artifact in the midline. Site of recent tooth extraction is unclear, possibly the right posterior bicuspid. Otherwise if it was ancisor then it may be obscured on this image. No periapical lucency identified.  IMPRESSION: Suboptimal due to motion artifact limiting visualization of the incisors and canines. Possible recent tooth extraction site at the right posterior bicuspid. Otherwise  no acute findings are evident.   Electronically Signed   By: Augusto Gamble M.D.   On: 05/06/2014 16:37   Ct Head Wo Contrast  05/05/2014   CLINICAL DATA:  Recent tooth extraction, altered mental status.  EXAM: CT HEAD WITHOUT CONTRAST  TECHNIQUE: Contiguous axial images were obtained from the base of the skull through the vertex without intravenous contrast.  COMPARISON:  CT of the head November 11, 2010  FINDINGS: No intraparenchymal hemorrhage, mass effect, midline shift nor acute large vascular territory infarcts. Mild ventriculomegaly, likely on the basis of global parenchymal brain volume loss as there is commensurate enlargement of cerebral sulci and cerebellar folia, unchanged and within normal range for patient's age. Bilateral deep brain stimulators via high bifrontal burr holes, unchanged in position in the region of the substantia nigra. Gliosis about the lead tracts.  No abnormal extra-axial fluid collections. Basal cisterns are patent.  Bilateral ocular lens implants, visualized ocular globes and orbital contents are unremarkable. Mild left maxillary bilateral ethmoid mucosal thickening without paranasal sinus air-fluid levels. Left sphenoid mucosal retention cysts, present previously. Visualized mastoid air cells are well aerated. No skull fracture.  IMPRESSION: No acute intracranial process.  Bilateral DBS in situ.  Worsening paranasal sinusitis.   Electronically Signed   By: Awilda Metro   On: 05/05/2014 16:03   Dg Chest Port 1 View  05/06/2014   CLINICAL DATA:  Cough and fever.  EXAM: PORTABLE CHEST - 1 VIEW  COMPARISON:  Chest x-ray 05/05/2014.  FINDINGS: Lung volumes are low. Retrocardiac opacity favored to reflect some subsegmental atelectasis, although underlying airspace consolidation is difficult to exclude. No pleural effusions. No evidence of pulmonary edema. Heart size is borderline enlarged. The patient is rotated to the left on today's exam, resulting in distortion of the mediastinal  contours and reduced diagnostic sensitivity and specificity for mediastinal pathology. Right-sided pacemaker in position with lead tips projecting over the right atrium and right ventricle. Left-sided electronic device with leads extending toward the head may represent a deep brain stimulator.  IMPRESSION: 1. Low lung volumes with atelectasis and consolidation in the left lower lobe.   Electronically Signed   By: Trudie Reed M.D.   On: 05/06/2014 11:20   Dg Chest Port 1 View  (if Code Sepsis Called)  05/05/2014   CLINICAL DATA:  Weakness.  EXAM: PORTABLE CHEST - 1 VIEW  COMPARISON:  Mild cardiomegaly, no acute pulmonary process.  FINDINGS: Cardiac silhouette appears mildly enlarged, even with  consideration to this low inspiratory portable examination carotid vasculature markings. Mediastinal silhouette is nonsuspicious. No pleural effusions or focal consolidations. No pneumothorax. Implantable electronic device in left chest may reflect vagal stimulator. Dual lead cardiac pacemaker right chest, unchanged. No pneumothorax.  Multiple EKG lines overlie the patient and may obscure subtle underlying pathology. Lumbar scoliosis partially imaged. Soft tissue planes are nonsuspicious. Remote right posterior third and fourth rib fractures.  IMPRESSION: Mild cardiomegaly, no acute pulmonary process.   Electronically Signed   By: Awilda Metroourtnay  Bloomer   On: 05/05/2014 15:25    Microbiology: Recent Results (from the past 240 hour(s))  FECAL OCCULT BLOOD, IMMUNOCHEMICAL     Status: None   Collection Time    05/04/14 11:17 AM      Result Value Ref Range Status   Fecal Occult Bld Negative  Negative Final  URINE CULTURE     Status: None   Collection Time    05/05/14  2:12 PM      Result Value Ref Range Status   Specimen Description URINE, RANDOM   Final   Special Requests NONE   Final   Culture  Setup Time     Final   Value: 05/05/2014 20:52     Performed at [facility]   Colony Count     Final    Value: 25,000 COLONIES/ML     Performed at [facility]   Culture     Final   Value: Multiple bacterial morphotypes present, none predominant. Suggest appropriate recollection if clinically indicated.     Performed at [facility]   Report Status 05/07/2014 FINAL   Final  CULTURE, BLOOD (ROUTINE X 2)     Status: None   Collection Time    05/05/14  2:34 PM      Result Value Ref Range Status   Specimen Description BLOOD RIGHT ANTECUBITAL   Final   Special Requests BOTTLES DRAWN AEROBIC AND ANAEROBIC 5CCS   Final   Culture  Setup Time     Final   Value: 05/05/2014 18:46     Performed at [facility]   Culture     Final   Value:        BLOOD CULTURE RECEIVED NO GROWTH TO DATE CULTURE WILL BE HELD FOR 5 DAYS BEFORE ISSUING A FINAL NEGATIVE REPORT     Performed at [facility]   Report Status PENDING   Incomplete  CULTURE, BLOOD (ROUTINE X 2)     Status: None   Collection Time    05/05/14  2:34 PM      Result Value Ref Range Status   Specimen Description BLOOD RIGHT HAND   Final   Special Requests BOTTLES DRAWN AEROBIC AND ANAEROBIC 6CCS   Final   Culture  Setup Time     Final   Value: 05/05/2014 18:46     Performed at [facility]   Culture     Final   Value:        BLOOD CULTURE RECEIVED NO GROWTH TO DATE CULTURE WILL BE HELD FOR 5 DAYS BEFORE ISSUING A FINAL NEGATIVE REPORT     Performed at [facility]   Report Status PENDING   Incomplete     Labs: Basic Metabolic Panel:  Recent Labs Lab 05/05/14 1408 05/05/14 1506 05/06/14 0440 05/08/14 0553 05/09/14 0535  NA 134* 136* 137 139 140  K 4.3 4.1 4.1 4.1 3.7  CL 97 98 102 101 101  CO2 25  --  24 26 27   GLUCOSE 113* 109* 95 101* 92  BUN 13 13 13 9 8   CREATININE 0.78 0.90 0.76 0.72 0.80  CALCIUM 9.1  --  8.1* 8.8 8.9   Liver Function Tests:  Recent Labs Lab 05/05/14 1408  AST 27  ALT 13  ALKPHOS 95  BILITOT 0.8  PROT 7.1  ALBUMIN 3.8   No results found for this  basename: LIPASE, AMYLASE,  in the last 168 hours No results found for this basename: AMMONIA,  in the last 168 hours CBC:  Recent Labs Lab 05/05/14 1408 05/05/14 1506 05/06/14 0440 05/08/14 0553  WBC 7.8  --  3.7* 4.4  NEUTROABS 6.0  --   --   --   HGB 13.1 14.6 11.7* 12.9*  HCT 41.1 43.0 36.4* 41.0  MCV 87.4  --  87.7 88.2  PLT 289  --  252 285   Cardiac Enzymes:  Recent Labs Lab 05/05/14 1724  TROPONINI <0.30   BNP: BNP (last 3 results) No results found for this basename: PROBNP,  in the last 8760 hours CBG:  Recent Labs Lab 05/07/14 1215 05/09/14 1047  GLUCAP 111* 95       Signed:  Flordia Kassem C  [facility] 05/09/2014, 12:03 PM

## 2014-05-09 NOTE — Progress Notes (Signed)
PMR Admission Coordinator Pre-Admission Assessment  Patient: [name] is an 73 y.o., male  MRN: [id number]  DOB: [birth year]  Height: 5' 6.93" (170 cm)  Weight: 78.019 kg (172 lb)  Insurance Information  HMO:No PPO: PCP: IPA: 80/20: OTHER:  PRIMARY: Medicare A/B Policy#: [id number] Subscriber: Vanetta Mulders  CM Name: Phone#: Fax#:  Pre-Cert#: Employer: Retired  Benefits: Phone #: Name: Checked in [place]. Date: 11/11/03 Deduct: $1260 Out of Pocket Max: none Life Max: unlimited  CIR: 100% SNF: 100 days  Outpatient: 80% Co-Pay: 20%  Home Health: 100% Co-Pay: none  DME: 80% Co-Pay: 20%  Providers: patient's choice   SECONDARY: BCBS of  [place] Policy#: [id number] Subscriber: Vanetta Mulders  CM Name: Phone#: Fax#:  Pre-Cert#: Employer: Retired  Benefits: Phone #: [phone] Name:  Eff. Date: Deduct: Out of Pocket Max: Life Max:  CIR: SNF:  Outpatient: Co-Pay:  Home Health: Co-Pay:  DME: Co-Pay:  Emergency Contact Information  Contact Information    Name  Relation  Home  Work  Mobile    [place]  Spouse  [phone]   [phone]      Current Medical History  Patient Admitting Diagnosis: Parkinson's/Fever  History of Present Illness: A 73 y.o. right-handed male with history significant for Parkinson's disease status post deep brain stimulator by Dr. Angelyn Punt neurosurgery at [facility], CAD/pacemaker, dementia. Patient lives with his wife used a rollator prior to admission and goes to the gym to participate in a Parkinson's program. Admitted 05/05/2014 with chills, fever and generalized weakness with increasing confusion. Reports of recent tooth extraction 2 days prior to admission. Chest x-ray negative. Cranial CT scan with no acute intracranial abnormalities with bilateral deep brain stimulators via high bifrontal Burr holes. Urine study 25,000 colony multi-species. Patient placed on broad-spectrum antibiotics. Blood cultures remain negative. Subcutaneous  Lovenox added for DVT prophylaxis. Patient remains on Sinemet for history of Parkinson's disease. Currently maintained on a dysphagia to thin liquid diet. Mental status continues to improve recently placed on Seroquel. Physical therapy evaluation completed 05/07/2014 with recommendations for physical medicine rehabilitation consult. Pt has had multiple revisions to DBS originally place 2012. Had battery replacement last fall, followed by lead adjustment , then lead replacement in April of this year.  Past Medical History  Past Medical History   Diagnosis  Date   .  Parkinson disease    .  Bradycardia      syncope due to prolonged pauses, resolved with PPM   .  Sleep apnea    .  Hypertension    .  Dermatitis    .  Depression    .  Rib fractures  2011     S/p fall   .  Maxillary fracture  2009     Right   .  BPH (benign prostatic hypertrophy)    .  Coronary artery disease  1986     Inferior wall MI treated with Angioplasty   .  Normal echocardiogram  2007     Mild LVH with Impaired relaxation, Mild-Mod. Aortic Root dilation, Mild Aortic Sclerosis, Mild MR, Mild TR, Ef-50-55%   .  Normal nuclear stress test  2006     Normal EF-54%   .  CAD (coronary artery disease)    .  Inferior MI    .  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  Maxillary fracture  2009     Right side   .  Rib fracture  07/2010   .  Atrioventricular block, complete    .  Pacemaker    .  H/O hiatal hernia     Family History  family history includes Heart attack in his father, mother, and sister; Heart disease in his brother and mother; Hypertension in his mother; Other in his father.  Prior Rehab/Hospitalizations: Had outpatient 2-3 yrs ago for Parkinson's disease.  Current Medications  Current facility-administered medications:acetaminophen (TYLENOL) suppository 650 mg, 650 mg, Rectal, Q6H PRN, Adeline C Viyuoh, MD; acetaminophen (TYLENOL) tablet 650 mg, 650 mg, Oral, Q6H PRN, Adeline C Viyuoh, MD; antiseptic  oral rinse (BIOTENE) solution 15 mL, 15 mL, Mouth Rinse, BID, Adeline C Viyuoh, MD, 15 mL at 05/09/14 0756  carbidopa-levodopa (SINEMET IR) 25-100 MG per tablet immediate release 1 tablet, 1 tablet, Oral, QHS, Kela Millin, MD, 1 tablet at 05/08/14 2117; carbidopa-levodopa (SINEMET IR) 25-100 MG per tablet immediate release 2 tablet, 2 tablet, Oral, QID, Kela Millin, MD, 2 tablet at 05/09/14 0917; dapsone tablet 25 mg, 25 mg, Oral, BID, Adeline C Viyuoh, MD, 25 mg at 05/09/14 0917  docusate sodium (COLACE) capsule 100 mg, 100 mg, Oral, BID, Adeline C Viyuoh, MD, 100 mg at 05/08/14 1044; donepezil (ARICEPT) tablet 10 mg, 10 mg, Oral, Daily, Adeline C Viyuoh, MD, 10 mg at 05/09/14 0918; doxycycline (VIBRA-TABS) tablet 100 mg, 100 mg, Oral, Q12H, Adeline C Viyuoh, MD, 100 mg at 05/09/14 0918; enoxaparin (LOVENOX) injection 40 mg, 40 mg, Subcutaneous, Q24H, Adeline C Viyuoh, MD, 40 mg at 05/06/14 2234  entacapone (COMTAN) tablet 200 mg, 200 mg, Oral, QID, Adeline C Viyuoh, MD, 200 mg at 05/09/14 0917; escitalopram (LEXAPRO) tablet 10 mg, 10 mg, Oral, Daily, Adeline C Viyuoh, MD, 10 mg at 05/09/14 1610; ezetimibe (ZETIA) tablet 10 mg, 10 mg, Oral, Daily, Adeline C Viyuoh, MD, 10 mg at 05/09/14 0917; magnesium oxide (MAG-OX) tablet 400 mg, 400 mg, Oral, Daily, Adeline C Viyuoh, MD, 400 mg at 05/09/14 0918  mirabegron ER (MYRBETRIQ) tablet 50 mg, 50 mg, Oral, Daily, Adeline C Viyuoh, MD, 50 mg at 05/09/14 0918; nitroGLYCERIN (NITROSTAT) SL tablet 0.4 mg, 0.4 mg, Sublingual, Q5 min PRN, Adeline C Viyuoh, MD; ondansetron (ZOFRAN) injection 4 mg, 4 mg, Intravenous, Q6H PRN, Adeline C Viyuoh, MD; ondansetron (ZOFRAN) tablet 4 mg, 4 mg, Oral, Q6H PRN, Adeline C Viyuoh, MD  QUEtiapine (SEROQUEL) tablet 25 mg, 25 mg, Oral, QHS, Adeline C Viyuoh, MD, 25 mg at 05/08/14 2117; sodium chloride 0.9 % injection 3 mL, 3 mL, Intravenous, Q12H, Adeline C Viyuoh, MD, 3 mL at 05/09/14 9604; tamsulosin (FLOMAX) capsule 0.4 mg,  0.4 mg, Oral, Daily, Adeline C Viyuoh, MD, 0.4 mg at 05/09/14 5409; vitamin B-12 (CYANOCOBALAMIN) tablet 1,000 mcg, 1,000 mcg, Oral, Daily, Adeline C Viyuoh, MD, 1,000 mcg at 05/09/14 8119  Patients Current Diet: Dysphagia  Precautions / Restrictions  Precautions  Precautions: Fall  Restrictions  Weight Bearing Restrictions: No  Prior Activity Level  Community (5-7x/wk): Went out 3 X a week.  Home Assistive Devices / Equipment  Home Assistive Devices/Equipment: CPAP;Walker (specify type) (wheels & seat with brakes)  Home Equipment: Walker - 2 wheels;Grab bars - tub/shower;Cane - single point  Prior Functional Level  Prior Function  Level of Independence: Independent with assistive device(s)  Comments: pt normally walks with a rollator, performs transfers and ADLs on his own. he goes to the gym to participate in a Parkinson's program.  Current Functional Level  Cognition  Overall Cognitive Status: Impaired/Different from baseline  Orientation Level: Oriented to person;Oriented to place;Oriented to  time;Disoriented to situation  Safety/Judgement: Decreased awareness of safety;Decreased awareness of deficits   Extremity Assessment  (includes Sensation/Coordination)  Upper Extremity Assessment: LUE deficits/detail;Generalized weakness  LUE Deficits / Details: LUE with tremor  Lower Extremity Assessment: Generalized weakness  Cervical / Trunk Assessment: Normal;Other exceptions   ADLs  Overall ADL's : Needs assistance/impaired  Grooming: Brushing hair;Standing;Minimal assistance;Moderate assistance;Maximal assistance;Oral care;Wash/dry face  Upper Body Bathing: Set up;Supervision/ safety;Sitting  Lower Body Dressing: Moderate assistance;Sit to/from stand  Toilet Transfer: Moderate assistance;Ambulation;RW (chair)  Functional mobility during ADLs: Moderate assistance;Rolling walker  General ADL Comments: Recommended pt sit for LB bathing (spoke with wife about recommending shower chair). Pt  stood at sink to perform grooming tasks- assist with balance.   Mobility  Overal bed mobility: Needs Assistance  Bed Mobility: Sit to Supine  Supine to sit: HOB elevated;Min assist  Sit to supine: Mod assist  General bed mobility comments: cues for sequence with assist to elevate legs and control descent of trunk   Transfers  Overall transfer level: Needs assistance  Equipment used: Rolling walker (2 wheeled)  Transfers: Sit to/from Stand  Sit to Stand: Mod assist  General transfer comment: Cues for technique and hand placement. Pt using momentum to stand with significant posterior push and bias requiring assist to prevent fall and translate anteriorly   Ambulation / Gait / Stairs / Wheelchair Mobility  Ambulation/Gait  Ambulation/Gait assistance: Mod assist  Ambulation Distance (Feet): 5 Feet  Assistive device: Rolling walker (2 wheeled)  Gait Pattern/deviations: Shuffle;Trunk flexed;Leaning posteriorly  Gait velocity interpretation: Below normal speed for age/gender  General Gait Details: pt with posterior right lean with assist for balance, very unsteady gait with pt unable to keep eyes open with gait and turned back to bed   Posture / Balance  Overall balance assessment: Needs assistance  Sitting balance-Leahy Scale: Poor  Standing balance-Leahy Scale: Poor   Special needs/care consideration  BiPAP/CPAP Yes, has CPAP  CPM No  Continuous Drip IV No  Dialysis No  Life Vest No  Oxygen No  Special Bed No  Trach Size No  Wound Vac (area) No  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), but under control with medication  Bowel mgmt: Last BM 05/08/14  Bladder mgmt: Has a condom catheter  Diabetic mgmt No   Previous Home Environment  Living Arrangements: Spouse/significant other  Available Help at Discharge: Family;Personal care attendant;Available 24 hours/day  Type of Home: House  Home Layout: One level  Home Access: Stairs to enter  Entrance Stairs-Rails:  Right  Entrance Stairs-Number of Steps: 2  Bathroom Shower/Tub: Walk-in shower  Home Care Services: Yes  Type of Home Care Services: Other (Comment) (private help assist with dressing)  Home Care Agency (if known): private  Discharge Living Setting  Plans for Discharge Living Setting: Patient's home;House;Lives with (comment) (Lives with wife.)  Type of Home at Discharge: House  Discharge Home Layout: One level  Discharge Home Access: Stairs to enter  Entrance Stairs-Rails: Right (Rail has been reinforced for safety.)  Entrance Stairs-Number of Steps: 4 steps garage entry  Does the patient have any problems obtaining your medications?: No  Social/Family/Support Systems  Patient Roles: Spouse;Parent (Has a wife and a daughter.)  Contact Information: Jake Bathelaine Jaber - wife  Anticipated Caregiver: wife and an aide who works 6 hrs 2 days a week.  Anticipated Caregiver's Contact Information: Consuella Loselaine - wife (h) [phone] (c) [phone]  Ability/Limitations of Caregiver: wife can assist and has a hired caregiver 2 days a week.  Caregiver Availability: 24/7  Discharge Plan Discussed with Primary Caregiver: Yes  Is Caregiver In Agreement with Plan?: Yes  Does Caregiver/Family have Issues with Lodging/Transportation while Pt is in Rehab?: No  Goals/Additional Needs  Patient/Family Goal for Rehab: PT/OT/ST min assist goals  Expected length of stay: 10-14 days  Cultural Considerations: Attends a [employer] on Sundays  Dietary Needs: Dys 2, thin liquids  Equipment Needs: TBD  Pt/Family Agrees to Admission and willing to participate: Yes  Program Orientation Provided & Reviewed with Pt/Caregiver Including Roles & Responsibilities: Yes  Decrease burden of Care through IP rehab admission: N/A  Possible need for SNF placement upon discharge: Not planned  Patient Condition: This patient's condition remains as documented in the consult dated 05/08/14, in which the Rehabilitation Physician  determined and documented that the patient's condition is appropriate for intensive rehabilitative care in an inpatient rehabilitation facility. Will admit to inpatient rehab today.  Preadmission Screen Completed By: Trish Mage, 05/09/2014 11:47 AM  ______________________________________________________________________  Discussed status with Dr. Wynn Banker on 05/09/14 at 1200 and received telephone approval for admission today.  Admission Coordinator: Trish Mage, time1200/Date06/30/15  Cosigned by: Erick Colace, MD [05/09/2014 12:09 PM]

## 2014-05-09 NOTE — H&P (Signed)
Physical Medicine and Rehabilitation Admission H&P  Chief Complaint   Patient presents with   .  Weakness   :  HPI: [name] is a 73 y.o. right-handed male with history significant for Parkinson's disease status post deep brain stimulator by Dr. Salomon Fick neurosurgery at [facility], CAD/pacemaker, dementia. Patient lives with his wife used a rollator prior to admission and goes to the gym to participate in a Parkinson's program. Admitted 05/05/2014 with chills, fever and generalized weakness with increasing confusion. Reports of recent tooth extraction 2 days prior to admission. Chest x-ray negative. Cranial CT scan with no acute intracranial abnormalities with bilateral deep brain stimulators via high bifrontal Burr holes. Urine study 25,000 colony multi-species. Patient placed on broad-spectrum antibiotics and since changed to doxycycline. Blood cultures remain negative. Panorex negative for infection no reports of abscess. Subcutaneous Lovenox added for DVT prophylaxis. Patient remains on Sinemet for history of Parkinson's disease. Seroquel added each bedtime for bouts of restlessness. Currently maintained on a dysphagia 2 thin liquid diet. Marland Kitchen Physical therapy evaluation completed 05/07/2014 with recommendations for physical medicine rehabilitation consult. Patient was admitted for comprehensive rehabilitation program  ROS Review of Systems  Constitutional: Positive for fever.  Musculoskeletal: Positive for falls.  Neurological: Positive for speech change and weakness.  Psychiatric/Behavioral: Positive for depression and memory loss.  All other systems reviewed and are negative  Past Medical History   Diagnosis  Date   .  Parkinson disease    .  Bradycardia      syncope due to prolonged pauses, resolved with PPM   .  Sleep apnea    .  Hypertension    .  Dermatitis    .  Depression    .  Rib fractures  2011     S/p fall   .  Maxillary fracture  2009     Right   .  BPH (benign  prostatic hypertrophy)    .  Coronary artery disease  1986     Inferior wall MI treated with Angioplasty   .  Normal echocardiogram  2007     Mild LVH with Impaired relaxation, Mild-Mod. Aortic Root dilation, Mild Aortic Sclerosis, Mild MR, Mild TR, Ef-50-55%   .  Normal nuclear stress test  2006     Normal EF-54%   .  CAD (coronary artery disease)    .  Inferior MI    .  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  Maxillary fracture  2009     Right side   .  Rib fracture  07/2010   .  Atrioventricular block, complete    .  Pacemaker    .  H/O hiatal hernia     Past Surgical History   Procedure  Laterality  Date   .  Pacemaker insertion   10/28/10     MDT implanted by Dr Lovena Le   .  Deep brain stimulator placement       Parkinsons   .  Arthroscopic knee       Left knee   .  Cataract surgery       Bilateral   .  Inguinal hernia repair       Bilateral   .  Cardiac catheterization       Multiple Angioplasties, last 2002   .  Removal of ganglion cyst       right wrist   .  Insertion / placement / revision neurostimulator     .  Eye surgery     .  Insert / replace / remove pacemaker      Family History   Problem  Relation  Age of Onset   .  Heart attack  Mother    .  Heart disease  Mother    .  Hypertension  Mother    .  Heart attack  Father    .  Other  Father      renal calculi   .  Heart disease  Brother    .  Heart attack  Sister     Social History: reports that he has never smoked. He does not have any smokeless tobacco history on file. He reports that he does not drink alcohol or use illicit drugs.  Allergies:  Allergies   Allergen  Reactions   .  Crestor [Rosuvastatin Calcium]  Other (See Comments)     Muscle weakness   .  Bactrim  Rash   .  Gluten  Rash   .  Sulfamethoxazole-Trimethoprim  Rash    Medications Prior to Admission   Medication  Sig  Dispense  Refill   .  aspirin 81 MG tablet  Take 81 mg by mouth daily.     Marland Kitchen  CALCIUM-VITAMIN D PO  Take 1  tablet by mouth daily. $RemoveBefo'400mg'[id number]$  of calcium and 1000units of D3     .  carbidopa-levodopa (SINEMET) 25-100 MG per tablet  Take 1 tablet by mouth at bedtime.     .  Carbidopa-Levodopa-Entacapone (STALEVO 200 PO)  Take 200 mg by mouth 4 (four) times daily.     .  dapsone 25 MG tablet  Take 25 mg by mouth 2 (two) times daily.     Marland Kitchen  donepezil (ARICEPT) 10 MG tablet  Take 10 mg by mouth daily.     Marland Kitchen  escitalopram (LEXAPRO) 10 MG tablet  Take 10 mg by mouth daily.     Marland Kitchen  ezetimibe (ZETIA) 10 MG tablet  Take 10 mg by mouth daily.     .  Magnesium 400 MG CAPS  Take 400 mg by mouth daily.     .  mirabegron ER (MYRBETRIQ) 50 MG TB24 tablet  Take 50 mg by mouth daily.     .  nitroGLYCERIN (NITROSTAT) 0.4 MG SL tablet  Place 0.4 mg under the tongue every 5 (five) minutes as needed for chest pain.     Marland Kitchen  QUEtiapine (SEROQUEL) 25 MG tablet  Take 25 mg by mouth at bedtime.     .  Tamsulosin HCl (FLOMAX) 0.4 MG CAPS  Take 0.4 mg by mouth daily.     .  vitamin B-12 (CYANOCOBALAMIN) 1000 MCG tablet  Take 1,000 mcg by mouth daily.      Home:  Home Living  Family/patient expects to be discharged to:: Private residence  Living Arrangements: Spouse/significant other  Available Help at Discharge: Family;Personal care attendant;Available 24 hours/day  Type of Home: House  Home Access: Stairs to enter  [employer] of Steps: 2  Entrance Stairs-Rails: Right  Home Layout: One level  Home Equipment: Walker - 2 wheels;Grab bars - tub/shower;Cane - single point  Functional History:  Prior Function  Level of Independence: Independent with assistive device(s)  Comments: pt normally walks with a rollator, performs transfers and ADLs on his own. he goes to the gym to participate in a Parkinson's program.  Functional Status:  Mobility:  Bed Mobility  Overal bed mobility: Needs Assistance  Bed Mobility: Supine to Sit  Supine to sit: HOB elevated;Min assist  General bed mobility comments: not assessed   Transfers  Overall transfer level: Needs assistance  Equipment used: Rolling walker (2 wheeled)  Transfers: Sit to/from Stand  Sit to Stand: Mod assist  General transfer comment: Cues for technique and hand placement. Pt using momentum to stand.  Ambulation/Gait  Ambulation/Gait assistance: Mod assist  Ambulation Distance (Feet): 15 Feet  Assistive device: Rolling walker (2 wheeled)  Gait Pattern/deviations: Narrow base of support;Shuffle;Ataxic;Scissoring;Leaning posteriorly  Gait velocity interpretation: Below normal speed for age/gender  General Gait Details: pt with posterior right lean with assist for balance, clearing obstacles and pt periodically picking up RW despite cues. Pt scissoring x 2 and generally unsafe and unsteady with gait   ADL:  ADL  Overall ADL's : Needs assistance/impaired  Grooming: Brushing hair;Standing;Minimal assistance;Moderate assistance;Maximal assistance;Oral care;Wash/dry face  Upper Body Bathing: Set up;Supervision/ safety;Sitting  Lower Body Dressing: Moderate assistance;Sit to/from stand  Toilet Transfer: Moderate assistance;Ambulation;RW (chair)  Functional mobility during ADLs: Moderate assistance;Rolling walker  General ADL Comments: Recommended pt sit for LB bathing (spoke with wife about recommending shower chair). Pt stood at sink to perform grooming tasks- assist with balance.  Cognition:  Cognition  Overall Cognitive Status: Impaired/Different from baseline  Orientation Level: Oriented to person;Oriented to place;Oriented to time;Disoriented to situation  Cognition  Arousal/Alertness: Awake/alert  Behavior During Therapy: WFL for tasks assessed/performed  Overall Cognitive Status: Impaired/Different from baseline  Area of Impairment: Orientation;Safety/judgement  Orientation Level: Disoriented to;Place  Safety/Judgement: Decreased awareness of safety  Problem Solving: Slow processing  Physical Exam:  Blood pressure 120/64, pulse 77,  temperature 97.6 F (36.4 C), temperature source Oral, resp. rate 18, height 5' 6.93" (1.7 m), weight 78.019 kg (172 lb), SpO2 93.00%.  Physical Exam  Vitals reviewed.  HENT:  Head: Normocephalic.  Eyes: EOM are normal.  Neck: Normal range of motion. Neck supple. No thyromegaly present.  Cardiovascular: Normal rate and regular rhythm.  Respiratory: Effort normal and breath sounds normal. No respiratory distress.  GI: Soft. Bowel sounds are normal. He exhibits no distension.  Neurological:  Alert male in no acute distress. Speech is a bit halting but intelligible. He was able to provide his name and date of birth. Limited medical historian  Skin: Skin is warm and dry.  motor 5/5 in B UE and BLE  chorea noted LUE  Cerebellar intact  No cogwheeling  Results for orders placed during the hospital encounter of 05/05/14 (from the past 48 hour(s))   GLUCOSE, CAPILLARY Status: Abnormal    Collection Time    05/07/14 12:15 PM   Result  Value  Ref Range    Glucose-Capillary  111 (*)  70 - 99 mg/dL   VANCOMYCIN, TROUGH Status: None    Collection Time    05/07/14 4:10 PM   Result  Value  Ref Range    Vancomycin Tr  10.3  10.0 - 20.0 ug/mL   CBC Status: Abnormal    Collection Time    05/08/14 5:53 AM   Result  Value  Ref Range    WBC  4.4  4.0 - 10.5 K/uL    RBC  4.65  4.22 - 5.81 MIL/uL    Hemoglobin  12.9 (*)  13.0 - 17.0 g/dL    HCT  41.0  39.0 - 52.0 %    MCV  88.2  78.0 - 100.0 fL    MCH  27.7  26.0 - 34.0 pg    MCHC  31.5  30.0 -  36.0 g/dL    RDW  14.4  11.5 - 15.5 %    Platelets  285  150 - 400 K/uL   BASIC METABOLIC PANEL Status: Abnormal    Collection Time    05/08/14 5:53 AM   Result  Value  Ref Range    Sodium  139  137 - 147 mEq/L    Potassium  4.1  3.7 - 5.3 mEq/L    Chloride  101  96 - 112 mEq/L    CO2  26  19 - 32 mEq/L    Glucose, Bld  101 (*)  70 - 99 mg/dL    BUN  9  6 - 23 mg/dL    Creatinine, Ser  0.72  0.50 - 1.35 mg/dL    Calcium  8.8  8.4 - 10.5 mg/dL     GFR calc non Af Amer  89 (*)  >90 mL/min    GFR calc Af Amer  >90  >90 mL/min    Comment:  (NOTE)     The eGFR has been calculated using the CKD EPI equation.     This calculation has not been validated in all clinical situations.     eGFR's persistently <90 mL/min signify possible Chronic Kidney     Disease.   BASIC METABOLIC PANEL Status: Abnormal    Collection Time    05/09/14 5:35 AM   Result  Value  Ref Range    Sodium  140  137 - 147 mEq/L    Potassium  3.7  3.7 - 5.3 mEq/L    Chloride  101  96 - 112 mEq/L    CO2  27  19 - 32 mEq/L    Glucose, Bld  92  70 - 99 mg/dL    BUN  8  6 - 23 mg/dL    Creatinine, Ser  0.80  0.50 - 1.35 mg/dL    Calcium  8.9  8.4 - 10.5 mg/dL    GFR calc non Af Amer  85 (*)  >90 mL/min    GFR calc Af Amer  >90  >90 mL/min    Comment:  (NOTE)     The eGFR has been calculated using the CKD EPI equation.     This calculation has not been validated in all clinical situations.     eGFR's persistently <90 mL/min signify possible Chronic Kidney     Disease.    No results found.  Medical Problem List and Plan:  1. Functional deficits secondary to Parkinson's disease with decline in functional status after fever of unknown etiology. Continue Sinemet as directed for Parkinson disease  2. DVT Prophylaxis/Anticoagulation: Lovenox. Monitor platelet counts and any signs of bleeding  3. Pain Management: Tylenol as needed  4. Mood/dementia: Aricept 10 mg daily, Lexapro 10 mg daily, Seroquel 25 mg each bedtime. Bed alarm for safety discuss cognitive baseline with family  5. Neuropsych: This patient is not capable of making decisions on his own behalf.  6. Dysphagia. Dysphagia 2 thin liquid diet  7. Recent tooth extraction. Continue doxycycline for now  8. Overactive bladder..Myrbetriq 50 mg daily, Flomax 0.4 mg daily. Check PVRs x3  9. Hyperlipidemia. Zetia  Post Admission Physician Evaluation:  1. Functional deficits secondary to . 2. Patient is admitted to  receive collaborative, interdisciplinary care between the physiatrist, rehab nursing staff, and therapy team. 3. Patient's level of medical complexity and substantial therapy needs in context of that medical necessity cannot be provided at a lesser intensity of care  such as a SNF. 4. Patient has experienced substantial functional loss from his/her baseline which was documented above under the "Functional History" and "Functional Status" headings. Judging by the patient's diagnosis, physical exam, and functional history, the patient has potential for functional progress which will result in measurable gains while on inpatient rehab. These gains will be of substantial and practical use upon discharge in facilitating mobility and self-care at the household level. 5. Physiatrist will provide 24 hour management of medical needs as well as oversight of the therapy plan/treatment and provide guidance as appropriate regarding the interaction of the two. 6. 24 hour rehab nursing will assist with bladder management, bowel management, safety, skin/wound care, disease management, medication administration, pain management and patient education and help integrate therapy concepts, techniques,education, etc. 7. PT will assess and treat for/with: pre gait, gait training, endurance , safety, equipment, neuromuscular re education. Goals are: sup/minA. 8. OT will assess and treat for/with: ADLs, Cognitive perceptual skills, Neuromuscular re education, safety, endurance, equipment. Goals are: min/sup. 9. SLP will assess and treat for/with: swallow, cognition. Goals are: safe po intake, Sup med management. 10. Case Management and Social Worker will assess and treat for psychological issues and discharge planning. 11. Team conference will be held weekly to assess progress toward goals and to determine barriers to discharge. 12. Patient will receive at least 3 hours of therapy per day at least 5 days per week. 13. ELOS: 10-14   14. Prognosis: good Charlett Blake M.D. [facility] FAAPM&R (Sports Med, Neuromuscular Med) Diplomate Am Board of Electrodiagnostic Med   05/09/2014

## 2014-05-09 NOTE — Progress Notes (Signed)
Physical Medicine and Rehabilitation Consult  Reason for Consult: Fever/Parkinson's disease  Referring Physician: [facility]  HPI: [name] is a 73 y.o. right-handed male with history significant for Parkinson's disease status post deep brain stimulator by Dr. Angelyn Punt neurosurgery at [facility], CAD/pacemaker, dementia. Patient lives with his wife used a rollator prior to admission and goes to the gym to participate in a Parkinson's program. Admitted 05/05/2014 with chills, fever and generalized weakness with increasing confusion. Reports of recent tooth extraction 2 days prior to admission. Chest x-ray negative. Cranial CT scan with no acute intracranial abnormalities with bilateral deep brain stimulators via high bifrontal Burr holes. Urine study 25,000 colony multi-species. Patient placed on broad-spectrum antibiotics. Blood cultures remain negative. Subcutaneous Lovenox added for DVT prophylaxis. Patient remains on Sinemet for history of Parkinson's disease. Currently maintained on a dysphagia to thin liquid diet. Mental status continues to improve recently placed on Seroquel. Physical therapy evaluation completed 05/07/2014 with recommendations for physical medicine rehabilitation consult.  Pt has had multiple revisions to DBS originally place 2012. Had battery replacement last fall, followed by lead adjustment , then lead replacement in April of this year.  Review of Systems  Constitutional: Positive for fever.  Musculoskeletal: Positive for falls.  Neurological: Positive for speech change and weakness.  Psychiatric/Behavioral: Positive for depression and memory loss.  All other systems reviewed and are negative.   Past Medical History   Diagnosis  Date   .  Parkinson disease    .  Bradycardia      syncope due to prolonged pauses, resolved with PPM   .  Sleep apnea    .  Hypertension    .  Dermatitis    .  Depression    .  Rib fractures  2011     S/p fall   .  Maxillary fracture   2009     Right   .  BPH (benign prostatic hypertrophy)    .  Coronary artery disease  1986     Inferior wall MI treated with Angioplasty   .  Normal echocardiogram  2007     Mild LVH with Impaired relaxation, Mild-Mod. Aortic Root dilation, Mild Aortic Sclerosis, Mild MR, Mild TR, Ef-50-55%   .  Normal nuclear stress test  2006     Normal EF-54%   .  CAD (coronary artery disease)    .  Inferior MI    .  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  Maxillary fracture  2009     Right side   .  Rib fracture  07/2010   .  Atrioventricular block, complete    .  Pacemaker    .  H/O hiatal hernia     Past Surgical History   Procedure  Laterality  Date   .  Pacemaker insertion   10/28/10     MDT implanted by Dr Ladona Ridgel   .  Deep brain stimulator placement       Parkinsons   .  Arthroscopic knee       Left knee   .  Cataract surgery       Bilateral   .  Inguinal hernia repair       Bilateral   .  Cardiac catheterization       Multiple Angioplasties, last 2002   .  Removal of ganglion cyst       right wrist   .  Insertion / placement / revision neurostimulator     .  Eye surgery     .  Insert / replace / remove pacemaker      Family History   Problem  Relation  Age of Onset   .  Heart attack  Mother    .  Heart disease  Mother    .  Hypertension  Mother    .  Heart attack  Father    .  Other  Father      renal calculi   .  Heart disease  Brother    .  Heart attack  Sister     Social History: reports that he has never smoked. He does not have any smokeless tobacco history on file. He reports that he does not drink alcohol or use illicit drugs.  Allergies:  Allergies   Allergen  Reactions   .  Crestor [Rosuvastatin Calcium]  Other (See Comments)     Muscle weakness   .  Bactrim  Rash   .  Gluten  Rash   .  Sulfamethoxazole-Trimethoprim  Rash    Medications Prior to Admission   Medication  Sig  Dispense  Refill   .  aspirin 81 MG tablet  Take 81 mg by mouth daily.      Marland Kitchen.  CALCIUM-VITAMIN D PO  Take 1 tablet by mouth daily. 400mg  of calcium and 1000units of D3     .  carbidopa-levodopa (SINEMET) 25-100 MG per tablet  Take 1 tablet by mouth at bedtime.     .  Carbidopa-Levodopa-Entacapone (STALEVO 200 PO)  Take 200 mg by mouth 4 (four) times daily.     .  dapsone 25 MG tablet  Take 25 mg by mouth 2 (two) times daily.     Marland Kitchen.  donepezil (ARICEPT) 10 MG tablet  Take 10 mg by mouth daily.     Marland Kitchen.  escitalopram (LEXAPRO) 10 MG tablet  Take 10 mg by mouth daily.     Marland Kitchen.  ezetimibe (ZETIA) 10 MG tablet  Take 10 mg by mouth daily.     .  Magnesium 400 MG CAPS  Take 400 mg by mouth daily.     .  mirabegron ER (MYRBETRIQ) 50 MG TB24 tablet  Take 50 mg by mouth daily.     .  nitroGLYCERIN (NITROSTAT) 0.4 MG SL tablet  Place 0.4 mg under the tongue every 5 (five) minutes as needed for chest pain.     Marland Kitchen.  QUEtiapine (SEROQUEL) 25 MG tablet  Take 25 mg by mouth at bedtime.     .  Tamsulosin HCl (FLOMAX) 0.4 MG CAPS  Take 0.4 mg by mouth daily.     .  vitamin B-12 (CYANOCOBALAMIN) 1000 MCG tablet  Take 1,000 mcg by mouth daily.      Home:  Home Living  Family/patient expects to be discharged to:: Private residence  Living Arrangements: Spouse/significant other  Available Help at Discharge: Family;Personal care attendant;Available 24 hours/day  Type of Home: House  Home Access: Stairs to enter  Entergy CorporationEntrance Stairs-Number of Steps: 2  Entrance Stairs-Rails: Right  Home Layout: One level  Home Equipment: Walker - 2 wheels;Grab bars - tub/shower;Cane - single point  Functional History:  Prior Function  Level of Independence: Independent with assistive device(s)  Comments: pt normally walks with a rollator, performs transfers and ADLs on his own. he goes to the gym to participate in a Parkinson's program.  Functional Status:  Mobility:  Bed Mobility  Overal bed mobility: Needs Assistance  Bed Mobility: Supine to Sit  Supine to sit: HOB elevated;Min assist  General bed  mobility comments: not assessed  Transfers  Overall transfer level: Needs assistance  Equipment used: Rolling walker (2 wheeled)  Transfers: Sit to/from Stand  Sit to Stand: Mod assist  General transfer comment: Cues for technique and hand placement. Pt using momentum to stand.  Ambulation/Gait  Ambulation/Gait assistance: Mod assist  Ambulation Distance (Feet): 15 Feet  Assistive device: Rolling walker (2 wheeled)  Gait Pattern/deviations: Narrow base of support;Shuffle;Ataxic;Scissoring;Leaning posteriorly  Gait velocity interpretation: Below normal speed for age/gender  General Gait Details: pt with posterior right lean with assist for balance, clearing obstacles and pt periodically picking up RW despite cues. Pt scissoring x 2 and generally unsafe and unsteady with gait   ADL:  ADL  Overall ADL's : Needs assistance/impaired  Grooming: Brushing hair;Standing;Minimal assistance;Moderate assistance;Maximal assistance;Oral care;Wash/dry face  Upper Body Bathing: Set up;Supervision/ safety;Sitting  Lower Body Dressing: Moderate assistance;Sit to/from stand  Toilet Transfer: Moderate assistance;Ambulation;RW (chair)  Functional mobility during ADLs: Moderate assistance;Rolling walker  General ADL Comments: Recommended pt sit for LB bathing (spoke with wife about recommending shower chair). Pt stood at sink to perform grooming tasks- assist with balance.  Cognition:  Cognition  Overall Cognitive Status: Impaired/Different from baseline  Orientation Level: Disoriented to place;Disoriented to time;Oriented to person;Disoriented to situation  Cognition  Arousal/Alertness: Awake/alert  Behavior During Therapy: WFL for tasks assessed/performed  Overall Cognitive Status: Impaired/Different from baseline  Area of Impairment: Orientation;Safety/judgement  Orientation Level: Disoriented to;Place  Safety/Judgement: Decreased awareness of safety  Problem Solving: Slow processing  Blood pressure  133/82, pulse 65, temperature 97.4 F (36.3 C), temperature source Oral, resp. rate 18, height 5' 6.93" (1.7 m), weight 78.019 kg (172 lb), SpO2 92.00%.  Physical Exam  Vitals reviewed.  HENT:  Head: Normocephalic.  Eyes: EOM are normal.  Neck: Normal range of motion. Neck supple. No thyromegaly present.  Cardiovascular: Normal rate and regular rhythm.  Respiratory: Effort normal and breath sounds normal. No respiratory distress.  GI: Soft. Bowel sounds are normal. He exhibits no distension.  Neurological:  Alert male in no acute distress. Speech is a bit halting but intelligible. He was able to provide his name and date of birth. Limited medical historian  Skin: Skin is warm and dry.  motor 5/5 in B UE and BLE  chorea noted LUE  Cerebellar intact  No cogwheeling  Memory intially with no ST recall but with cuing was able to remember 3 objects after 2 minute delay  Results for orders placed during the hospital encounter of 05/05/14 (from the past 24 hour(s))   GLUCOSE, CAPILLARY Status: Abnormal    Collection Time    05/07/14 12:15 PM   Result  Value  Ref Range    Glucose-Capillary  111 (*)  70 - 99 mg/dL   VANCOMYCIN, TROUGH Status: None    Collection Time    05/07/14 4:10 PM   Result  Value  Ref Range    Vancomycin Tr  10.3  10.0 - 20.0 ug/mL   CBC Status: Abnormal    Collection Time    05/08/14 5:53 AM   Result  Value  Ref Range    WBC  4.4  4.0 - 10.5 K/uL    RBC  4.65  4.22 - 5.81 MIL/uL    Hemoglobin  12.9 (*)  13.0 - 17.0 g/dL    HCT  16.1  09.6 - 04.5 %    MCV  88.2  78.0 - 100.0 fL  MCH  27.7  26.0 - 34.0 pg    MCHC  31.5  30.0 - 36.0 g/dL    RDW  96.0  45.4 - 09.8 %    Platelets  285  150 - 400 K/uL    Dg Orthopantogram  05/06/2014 CLINICAL DATA: 73 year old male with fever, recent tooth extraction. Initial encounter. EXAM: ORTHOPANTOGRAM/PANORAMIC COMPARISON: Head CT without contrast 05/05/2014. FINDINGS: Motion artifact in the midline. Site of recent tooth  extraction is unclear, possibly the right posterior bicuspid. Otherwise if it was ancisor then it may be obscured on this image. No periapical lucency identified. IMPRESSION: Suboptimal due to motion artifact limiting visualization of the incisors and canines. Possible recent tooth extraction site at the right posterior bicuspid. Otherwise no acute findings are evident. Electronically Signed By: Augusto Gamble M.D. On: 05/06/2014 16:37  Dg Chest Port 1 View  05/06/2014 CLINICAL DATA: Cough and fever. EXAM: PORTABLE CHEST - 1 VIEW COMPARISON: Chest x-ray 05/05/2014. FINDINGS: Lung volumes are low. Retrocardiac opacity favored to reflect some subsegmental atelectasis, although underlying airspace consolidation is difficult to exclude. No pleural effusions. No evidence of pulmonary edema. Heart size is borderline enlarged. The patient is rotated to the left on today's exam, resulting in distortion of the mediastinal contours and reduced diagnostic sensitivity and specificity for mediastinal pathology. Right-sided pacemaker in position with lead tips projecting over the right atrium and right ventricle. Left-sided electronic device with leads extending toward the head may represent a deep brain stimulator. IMPRESSION: 1. Low lung volumes with atelectasis and consolidation in the left lower lobe. Electronically Signed By: Trudie Reed M.D. On: 05/06/2014 11:20   Assessment/Plan:  Diagnosis: Parkinson's disease with decline in functional status after fever of unknown etiology  1. Does the need for close, 24 hr/day medical supervision in concert with the patient's rehab needs make it unreasonable for this patient to be served in a less intensive setting? Yes 2. Co-Morbidities requiring supervision/potential complications: Afib, CAD, B12 def 3. Due to bladder management, bowel management, safety, skin/wound care, disease management, medication administration and patient education, does the patient require 24 hr/day  rehab nursing? Yes 4. Does the patient require coordinated care of a physician, rehab nurse, PT (1-2 hrs/day, 5 days/week), OT (1-2 hrs/day, 5 days/week) and SLP (.5-1 hrs/day, 5 days/week) to address physical and functional deficits in the context of the above medical diagnosis(es)? Yes Addressing deficits in the following areas: balance, endurance, locomotion, transferring, bowel/bladder control, bathing, dressing, feeding, grooming, toileting, cognition, speech, swallowing and psychosocial support 5. Can the patient actively participate in an intensive therapy program of at least 3 hrs of therapy per day at least 5 days per week? Yes 6. The potential for patient to make measurable gains while on inpatient rehab is good 7. Anticipated functional outcomes upon discharge from inpatient rehab are min assist with PT, min assist with OT, min assist with SLP. 8. Estimated rehab length of stay to reach the above functional goals is: 10-14 days 9. Does the patient have adequate social supports to accommodate these discharge functional goals? Yes 10. Anticipated D/C setting: Home 11. Anticipated post D/C treatments: HH therapy 12. Overall Rehab/Functional Prognosis: good RECOMMENDATIONS:  This patient's condition is appropriate for continued rehabilitative care in the following setting: CIR  Patient has agreed to participate in recommended program. Potentially  Note that insurance prior authorization may be required for reimbursement for recommended care.  Comment: wife has hired respite care  05/08/2014  Revision History...      Date/Time User Action  05/08/2014 3:58 PM Erick Colace, MD Sign    05/08/2014 7:33 AM Charlton Amor, PA-C [place]   View Details Report    Routing History.Marland KitchenMarland Kitchen

## 2014-05-10 ENCOUNTER — Inpatient Hospital Stay (HOSPITAL_COMMUNITY): Payer: Medicare Other | Admitting: Speech Pathology

## 2014-05-10 ENCOUNTER — Inpatient Hospital Stay (HOSPITAL_COMMUNITY): Payer: Medicare Other | Admitting: Occupational Therapy

## 2014-05-10 ENCOUNTER — Inpatient Hospital Stay (HOSPITAL_COMMUNITY): Payer: Medicare Other | Admitting: Physical Therapy

## 2014-05-10 LAB — COMPREHENSIVE METABOLIC PANEL
ALBUMIN: 3.5 g/dL (ref 3.5–5.2)
ALK PHOS: 82 U/L (ref 39–117)
ALT: 13 U/L (ref 0–53)
AST: 25 U/L (ref 0–37)
BUN: 11 mg/dL (ref 6–23)
CO2: 28 mEq/L (ref 19–32)
Calcium: 9 mg/dL (ref 8.4–10.5)
Chloride: 102 mEq/L (ref 96–112)
Creatinine, Ser: 0.83 mg/dL (ref 0.50–1.35)
GFR calc Af Amer: >90 mL/min (ref >90)
GFR calc non Af Amer: 84 mL/min — ABNORMAL LOW (ref >90)
Glucose, Bld: 91 mg/dL (ref 70–99)
POTASSIUM: 4.2 meq/L (ref 3.7–5.3)
Sodium: 141 mEq/L (ref 137–147)
TOTAL PROTEIN: 6.7 g/dL (ref 6.0–8.3)
Total Bilirubin: 0.5 mg/dL (ref 0.3–1.2)

## 2014-05-10 LAB — CBC WITH DIFFERENTIAL/PLATELET
BASOS ABS: 0 10*3/uL (ref 0.0–0.1)
BASOS PCT: 1 % (ref 0–1)
Eosinophils Absolute: 0.4 10*3/uL (ref 0.0–0.7)
Eosinophils Relative: 6 % — ABNORMAL HIGH (ref 0–5)
HCT: 41.8 % (ref 39.0–52.0)
Hemoglobin: 13.1 g/dL (ref 13.0–17.0)
Lymphocytes Relative: 26 % (ref 12–46)
Lymphs Abs: 1.5 10*3/uL (ref 0.7–4.0)
MCH: 27.7 pg (ref 26.0–34.0)
MCHC: 31.3 g/dL (ref 30.0–36.0)
MCV: 88.4 fL (ref 78.0–100.0)
Monocytes Absolute: 0.8 10*3/uL (ref 0.1–1.0)
Monocytes Relative: 13 % — ABNORMAL HIGH (ref 3–12)
NEUTROS PCT: 54 % (ref 43–77)
Neutro Abs: 3.2 10*3/uL (ref 1.7–7.7)
PLATELETS: 319 10*3/uL (ref 150–400)
RBC: 4.73 MIL/uL (ref 4.22–5.81)
RDW: 14.4 % (ref 11.5–15.5)
WBC: 5.9 10*3/uL (ref 4.0–10.5)

## 2014-05-10 NOTE — Evaluation (Signed)
Physical Therapy Assessment and Plan  Patient Details  Name: [name] MRN: [id number] Date of Birth: [birth year]  PT Diagnosis: Abnormality of gait, Cognitive deficits, Difficulty walking and Impaired cognition Rehab Potential: Fair ELOS: 10-14 days   Today's Date: 05/10/2014 Time: 1103-1203 Time Calculation (min): 60 min  Problem List:  Patient Active Problem List   Diagnosis Date Noted  . Parkinson disease 05/09/2014  . Fever 05/05/2014  . Osteopenia 03/06/2014  . Postsurgical percutaneous transluminal coronary angioplasty status 08/23/2013  . Healed myocardial infarct 08/23/2013  . Obstructive apnea 08/23/2013  . Artificial cardiac pacemaker 08/23/2013  . Unspecified vitamin D deficiency 02/03/2013  . B12 deficiency 02/03/2013  . Dizziness 06/10/2012  . Dermatochalasis of eyelid 03/04/2012  . Error, refractive, myopia 03/04/2012  . Posterior vitreous detachment 03/04/2012  . Pseudoaphakia 03/04/2012  . Atrioventricular block, complete   . Idiopathic Parkinson's disease 08/29/2011  . Hematoma of chest wall 08/26/2011  . Hyperlipidemia 08/22/2011  . CAD (coronary artery disease) 08/22/2011  . Dementia 08/22/2011  . Atrial fibrillation 01/21/2011  . CARDIAC PACEMAKER IN SITU 01/21/2011  . PARKINSON'S DISEASE 11/18/2007  . MYOCARDIAL INFARCTION 11/18/2007  . 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 SLEEP APNEA 11/18/2007    Past Medical History:  Past Medical History  Diagnosis Date  . Parkinson disease   . Bradycardia     syncope due to prolonged pauses, resolved with PPM  . Sleep apnea   . Hypertension   . Dermatitis   . Depression   . Rib fractures 2011    S/p fall  . Maxillary fracture 2009    Right  . BPH (benign prostatic hypertrophy)   . Coronary artery disease 1986    Inferior wall MI treated with Angioplasty  . Normal echocardiogram 2007    Mild LVH with Impaired relaxation, Mild-Mod. Aortic Root dilation, Mild Aortic Sclerosis, Mild MR, Mild  TR, Ef-50-55%  . Normal nuclear stress test 2006    Normal EF-54%  . CAD (coronary artery disease)   . Inferior MI   . 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 Maxillary fracture 2009    Right side  . Rib fracture 07/2010  . Atrioventricular block, complete   . Pacemaker   . H/O hiatal hernia    Past Surgical History:  Past Surgical History  Procedure Laterality Date  . Pacemaker insertion  10/28/10    MDT implanted by Dr Lovena Le  . Deep brain stimulator placement      Parkinsons  . Arthroscopic knee      Left knee  . Cataract surgery      Bilateral  . Inguinal hernia repair      Bilateral  . Cardiac catheterization      Multiple Angioplasties, last 2002  . Removal of ganglion cyst      right wrist  . Insertion / placement / revision neurostimulator    . Eye surgery    . Insert / replace / remove pacemaker      Assessment & Plan Clinical Impression: [name] is a 73 y.o. right-handed male with history significant for Parkinson's disease status post deep brain stimulator by Dr. Salomon Fick neurosurgery at [facility], CAD/pacemaker, dementia. Patient lives with his wife used a rollator prior to admission and goes to the gym to participate in a Parkinson's program. Admitted 05/05/2014 with chills, fever and generalized weakness with increasing confusion. Reports of recent tooth extraction 2 days prior to admission. Chest x-ray negative. Cranial CT scan with no acute  intracranial abnormalities with bilateral deep brain stimulators via high bifrontal Burr holes. Urine study 25,000 colony multi-species. Patient placed on broad-spectrum antibiotics and since changed to doxycycline. Blood cultures remain negative. Panorex negative for infection no reports of abscess. Subcutaneous Lovenox added for DVT prophylaxis. Patient remains on Sinemet for history of Parkinson's disease. Seroquel added each bedtime for bouts of restlessness. Currently maintained on a dysphagia 2 thin liquid  diet. Marland Kitchen Physical therapy evaluation completed 05/07/2014 with recommendations for physical medicine rehabilitation consult. Patient was admitted for comprehensive rehabilitation program    Patient currently requires +2 with mobility secondary to impaired timing and sequencing, decreased coordination and decreased motor planning, decreased initiation, decreased awareness, decreased safety awareness and decreased memory and decreased standing balance, decreased postural control and decreased balance strategies.  Prior to hospitalization, patient was modified independent  with mobility and lived with Spouse in a House home.  Home access is 2-3Stairs to enter.  Patient will benefit from skilled PT intervention to maximize safe functional mobility, minimize fall risk and decrease caregiver burden for planned discharge home with 24 hour supervision. Goals may be adjusted pending progress, unable to accurately assess PLOF due to pt being poor historian. Anticipate patient will benefit from follow up OP at discharge.  PT - End of Session Activity Tolerance: Tolerates 30+ min activity with multiple rests Endurance Deficit: Yes PT Assessment Rehab Potential: Fair Barriers to Discharge: Decreased caregiver support PT Patient demonstrates impairments in the following area(s): Balance;Perception;Safety;Endurance;Motor PT Transfers Functional Problem(s): Bed Mobility;Bed to Chair;Car;Furniture PT Locomotion Functional Problem(s): Ambulation;Wheelchair Mobility;Stairs PT Plan PT Intensity: Minimum of 1-2 x/day ,45 to 90 minutes PT Frequency: 5 out of 7 days PT Duration Estimated Length of Stay: 10-14 days PT Treatment/Interventions: Ambulation/gait training;Balance/vestibular training;Community reintegration;Cognitive remediation/compensation;Discharge planning;Disease management/prevention;DME/adaptive equipment instruction;Functional mobility training;Neuromuscular re-education;Patient/family education;Stair  training;Therapeutic Exercise;Therapeutic Activities;UE/LE Coordination activities;Wheelchair propulsion/positioning PT Transfers Anticipated Outcome(s): supervision PT Locomotion Anticipated Outcome(s): supervision PT Recommendation Follow Up Recommendations: Outpatient PT Patient destination: Home Equipment Recommended: Rolling walker with 5" wheels Equipment Details: May adjust equipment recommendation pending progress  Skilled Therapeutic Intervention Pt ambulated w/ RW w/ +2 assist x75', required frequent VC's for sustained attention to task, foot placement, and gait sequencing. Pt left at nurse's station in w/c w/ quick release belt on in care of NT  PT Evaluation Precautions/Restrictions Precautions Precautions: Fall Restrictions Weight Bearing Restrictions: No General Chart Reviewed: Yes Additional Pertinent History: Parkinson's Disease, s/p deep brain stimulation implantation, pre-morbid demtnia Vital SignsTherapy Vitals Pulse Rate: 63 BP: 109/61 mmHg Pain Pain Assessment Pain Assessment: No/denies pain Pain Score: 0-No pain Home Living/Prior Functioning Home Living Available Help at Discharge: Family;Personal care attendant;Available 24 hours/day (Wife available 24 hrs, aide comes 2x/wk for 3 hrs at a time) Type of Home: House Home Access: Stairs to enter [employer] of Steps: 2-3 Entrance Stairs-Rails: Right Home Layout: One level  Lives With: Spouse Prior Function Level of Independence: Requires assistive device for independence  Able to Take Stairs?: Yes Driving: No Vocation: Retired Comments: Used English as a second language teacher, otherwise (I) in home. wife drives him to gym to [employer] Exercise program 2x/wk Cognition Overall Cognitive Status: Impaired/Different from baseline Orientation Level: Oriented to person;Oriented to place Memory: Impaired Awareness: Impaired Problem Solving: Impaired Behaviors: Impulsive;Restless Safety/Judgment:  Impaired Sensation Sensation Light Touch: Appears Intact Proprioception: Appears Intact Motor  Motor Motor - Skilled Clinical Observations: Occasional resting tremor, rigidity  Locomotion  Ambulation Ambulation: Yes Ambulation/Gait Assistance: 1: +2 Total assist Ambulation Distance (Feet): 75 Feet Assistive device: Rolling walker;2 person hand  held assist Ambulation/Gait Assistance Details: Verbal cues for gait pattern;Verbal cues for precautions/safety Ambulation/Gait Assistance Details: HHA for first walk, then RW for second bout Gait Gait: Yes Gait Pattern: Scissoring;Narrow base of support;Trunk flexed (Freezing w/ turning to L) Stairs / Additional Locomotion Stairs: Yes Stairs Assistance: 1: +2 Total assist Stairs Assistance Details: Tactile cues for placement;Verbal cues for technique;Verbal cues for precautions/safety Stairs Assistance Details (indicate cue type and reason): HOH for hand advancing on rails, max vc's for foot placement on steps Number of Stairs: 5 Wheelchair Mobility Wheelchair Mobility: Yes Wheelchair Assistance: 2: Max Technical sales engineer Details: Tactile cues for placement;Tactile cues for initiation Wheelchair Propulsion: Both upper extremities Distance: 20  Balance Balance Balance Assessed: Yes Static Standing Balance Static Standing - Balance Support: Bilateral upper extremity supported Static Standing - Level of Assistance: 1: +2 Total assist Static Standing - Comment/# of Minutes: 5 Extremity Assessment      RLE Assessment RLE Assessment: Within Functional Limits LLE Assessment LLE Assessment: Within Functional Limits  FIM:  FIM - Bed/Chair Transfer Bed/Chair Transfer: 0: Activity did not occur FIM - Locomotion: Wheelchair Distance: 20 Locomotion: Wheelchair: 1: Travels less than 50 ft with maximal assistance (Pt: 25 - 49%) FIM - Locomotion: Ambulation Locomotion: Ambulation Assistive Devices: Astronomer Ambulation/Gait Assistance: 1: +2 Total assist Locomotion: Ambulation: 1: Two helpers FIM - Locomotion: Stairs Locomotion: Scientist, physiological: Insurance account manager - 2 Locomotion: Stairs: 1: Two helpers   Refer to [employer] for Long Term Goals  Recommendations for other services: None  Discharge Criteria: Patient will be discharged from PT if patient refuses treatment 3 consecutive times without medical reason, if treatment goals not met, if there is a change in medical status, if patient makes no progress towards goals or if patient is discharged from hospital.  The above assessment, treatment plan, treatment alternatives and goals were discussed and mutually agreed upon: by patient  Glennon Hamilton, PT, DPT  05/10/2014, 1:32 PM

## 2014-05-10 NOTE — Evaluation (Signed)
Speech Language Pathology Assessment and Plan  Patient Details  Name: [name] MRN: [id number] Date of Birth: [birth year]  SLP Diagnosis: Cognitive Impairments;Dysphagia;Dysarthria  Rehab Potential: Good ELOS: 10-14 days   Today's Date: 05/10/2014 Time: 1435-1530 Time Calculation (min): 55 min  Problem List:  Patient Active Problem List   Diagnosis Date Noted  . Parkinson disease 05/09/2014  . Fever 05/05/2014  . Osteopenia 03/06/2014  . Postsurgical percutaneous transluminal coronary angioplasty status 08/23/2013  . Healed myocardial infarct 08/23/2013  . Obstructive apnea 08/23/2013  . Artificial cardiac pacemaker 08/23/2013  . Unspecified vitamin D deficiency 02/03/2013  . B12 deficiency 02/03/2013  . Dizziness 06/10/2012  . Dermatochalasis of eyelid 03/04/2012  . Error, refractive, myopia 03/04/2012  . Posterior vitreous detachment 03/04/2012  . Pseudoaphakia 03/04/2012  . Atrioventricular block, complete   . Idiopathic Parkinson's disease 08/29/2011  . Hematoma of chest wall 08/26/2011  . Hyperlipidemia 08/22/2011  . CAD (coronary artery disease) 08/22/2011  . Dementia 08/22/2011  . Atrial fibrillation 01/21/2011  . CARDIAC PACEMAKER IN SITU 01/21/2011  . PARKINSON'S DISEASE 11/18/2007  . MYOCARDIAL INFARCTION 11/18/2007  . 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 SLEEP APNEA 11/18/2007   Past Medical History:  Past Medical History  Diagnosis Date  . Parkinson disease   . Bradycardia     syncope due to prolonged pauses, resolved with PPM  . Sleep apnea   . Hypertension   . Dermatitis   . Depression   . Rib fractures 2011    S/p fall  . Maxillary fracture 2009    Right  . BPH (benign prostatic hypertrophy)   . Coronary artery disease 1986    Inferior wall MI treated with Angioplasty  . Normal echocardiogram 2007    Mild LVH with Impaired relaxation, Mild-Mod. Aortic Root dilation, Mild Aortic Sclerosis, Mild MR, Mild TR, Ef-50-55%  . Normal nuclear  stress test 2006    Normal EF-54%  . CAD (coronary artery disease)   . Inferior MI   . 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 Maxillary fracture 2009    Right side  . Rib fracture 07/2010  . Atrioventricular block, complete   . Pacemaker   . H/O hiatal hernia    Past Surgical History:  Past Surgical History  Procedure Laterality Date  . Pacemaker insertion  10/28/10    MDT implanted by Dr Ladona Ridgel  . Deep brain stimulator placement      Parkinsons  . Arthroscopic knee      Left knee  . Cataract surgery      Bilateral  . Inguinal hernia repair      Bilateral  . Cardiac catheterization      Multiple Angioplasties, last 2002  . Removal of ganglion cyst      right wrist  . Insertion / placement / revision neurostimulator    . Eye surgery    . Insert / replace / remove pacemaker      Assessment / Plan / Recommendation Clinical Impression  [name] ALVIZO is a 73 y.o. right-handed male with history significant for Parkinson's disease status post deep brain stimulator by Dr. Angelyn Punt neurosurgery at [facility], CAD/pacemaker, dementia. Patient lives with his wife used a rollator prior to admission and goes to the gym to participate in a Parkinson's program. Admitted 05/05/2014 with chills, fever and generalized weakness with increasing confusion. Reports of recent tooth extraction 2 days prior to admission. Chest x-ray negative. Cranial CT scan with no acute intracranial abnormalities with bilateral  deep brain stimulators via high bifrontal Burr holes. Urine study 25,000 colony multi-species. Patient placed on broad-spectrum antibiotics and since changed to doxycycline. Blood cultures remain negative. Panorex negative for infection no reports of abscess. Subcutaneous Lovenox added for DVT prophylaxis. Patient remains on Sinemet for history of Parkinson's disease. Seroquel added each bedtime for bouts of restlessness. Currently maintained on a dysphagia 2 thin liquid diet. Marland Kitchen Physical  therapy evaluation completed 05/07/2014 with recommendations for physical medicine rehabilitation consult. Patient was admitted for comprehensive rehabilitation program.  Pt was seen for SLP evaluation 05/10/2014 with the following results:  Pt presents with no overt s/s of aspiration across any consistencies assessed including thin liquids, purees, solids, and mixed consistencies.  Additionally, pt was noted with effective mastication and complete oral clearance of both solid and liquid consistencies.  Pt would benefit from remaining on dys 2 textures and thin liquids with full supervision in the setting of generalized weakness secondary to Parkinson's Disease and fluctuating cognitive status which could impact his swallowing safety and ability to fully comply with recommended swallowing precautions.  Pt also presents with moderate cognitive impairments for orientation, safety awareness/judgment, short term memory, sustained attention and executive function for sequencing, planning, thought organization, and error awareness.  Pt was noted with decreased vocal intensity and imprecise articulation of consonants in addition to poor topic maintenance secondary to decreased sustained attention.  Pt had overall poor awareness of how his deficits impacted his speech intelligibility; however he was stimulable for use of increased vocal intensity for brief periods of time following skilled dysarthria education.  Pt required min-mod cuing for return demonstration of both oral motor and pharyngeal strengthening exercises secondary to cognition.  Pt would benefit from skilled speech therapy while inpatient targeting dysarthria and dysphagia management as well as cognitive remediation/compensation.  Anticipate that pt would benefit from follow up speech therapy upon discharge in addition to supervision for safety awareness/insight and assistance for medication and financial management.     Skilled Therapeutic Interventions           Cognitive-linguistic evaluation completed with results and recommendations reviewed with family.     SLP Assessment  Patient will need skilled Speech Lanaguage Pathology Services during CIR admission    Recommendations  Diet Recommendations: Dysphagia 2 (Fine chop);Thin liquid Liquid Administration via: Cup Medication Administration: Whole meds with liquid Supervision: Patient able to self feed;Full supervision/cueing for compensatory strategies Compensations: Slow rate;Small sips/bites;Follow solids with liquid;Multiple dry swallows after each bite/sip Postural Changes and/or Swallow Maneuvers: Seated upright 90 degrees;Upright 30-60 min after meal;Out of bed for meals Oral Care Recommendations: Oral care BID Patient destination: Home Follow up Recommendations: Home Health SLP;24 hour supervision/assistance Equipment Recommended: None recommended by SLP    SLP Frequency 5 out of 7 days   SLP Treatment/Interventions Cognitive remediation/compensation;Dysphagia/aspiration precaution training;Functional tasks;Patient/family education;Oral motor exercises;Internal/external aids    Pain Pain Assessment Pain Assessment: No/denies pain Prior Functioning Cognitive/Linguistic Baseline: Baseline deficits Baseline deficit details: wife managed medications, finances, and household tasks  Type of Home: House  Lives With: Spouse Available Help at Discharge: Family;Personal care attendant;Available 24 hours/day Vocation: Retired  Industrial/product designer Term Goals: Week 1: SLP Short Term Goal 1 (Week 1): Pt will improve coordination of breath support with articulation for 80% speech intelligibilty in conversations with mod assist  SLP Short Term Goal 2 (Week 1): Pt will tolerate his currently prescribed diet with no overt s/s of aspiraiton and min cuing for use of swallowing precautions  SLP Short Term Goal 3 (Week  1): Pt will use external aids to facilitate improved orientaiton to place, date, and situation  as well as improved recall of daily events and information for 75% accuracy with mod assist.  SLP Short Term Goal 4 (Week 1): Pt will identify 2 environmental modifications that he can use to faciltate improved speech intelligibility during conversations with skilled and unskilled communication partners.   SLP Short Term Goal 5 (Week 1): Pt will sustain attention to basic, familiar tasks for 15-20 minutes with min cuing for redirection.   SLP Short Term Goal 6 (Week 1): Pt will tolerate trials of upgraded textures with no overt s/s of aspiration.   See FIM for current functional status Refer to Care Plan for Long Term Goals  Recommendations for other services: None  Discharge Criteria: Patient will be discharged from SLP if patient refuses treatment 3 consecutive times without medical reason, if treatment goals not met, if there is a change in medical status, if patient makes no progress towards goals or if patient is discharged from hospital.  The above assessment, treatment plan, treatment alternatives and goals were discussed and mutually agreed upon: by patient  Windell Moulding, M.A. CCC-SLP  Tashaun Obey, Selinda Orion 05/10/2014, 5:51 PM

## 2014-05-10 NOTE — Progress Notes (Signed)
Nursing Note: Pt asleep.wbb 

## 2014-05-10 NOTE — Progress Notes (Signed)
Nursing Note: Night meds given per orders and pt awake with NT at bedside 1:1 for safety as bed alarm keeps going off and pt will not stay in bed.wbb

## 2014-05-10 NOTE — Plan of Care (Signed)
Problem: RH SAFETY Goal: RH STG ADHERE TO SAFETY PRECAUTIONS W/ASSISTANCE/DEVICE STG Adhere to Safety Precautions With Assistance/Device.  Outcome: Not Progressing Pt has been agitated after family began to leave.wbb

## 2014-05-10 NOTE — Progress Notes (Signed)
Patient information reviewed and entered into eRehab system by Tylan Briguglio, RN, CRRN, PPS Coordinator.  Information including medical coding and functional independence measure will be reviewed and updated through discharge.     Per nursing patient was given "Data Collection Information Summary for Patients in Inpatient Rehabilitation Facilities with attached "Privacy Act Statement-Health Care Records" upon admission.  

## 2014-05-10 NOTE — Plan of Care (Signed)
Problem: RH BLADDER ELIMINATION Goal: RH STG MANAGE BLADDER WITH ASSISTANCE STG Manage Bladder With Assistance  Outcome: Not Progressing Condom cath. In place for bedtime.first night here.

## 2014-05-10 NOTE — Evaluation (Signed)
Occupational Therapy Assessment and Plan & Session Notes  Patient Details  Name: [name] MRN: [id number] Date of Birth: 20-Dec-1940  OT Diagnosis: abnormal posture, acute pain and muscle weakness (generalized) Rehab Potential: Rehab Potential: Good ELOS: 10-14 days   Today's Date: 05/10/2014  Problem List:  Patient Active Problem List   Diagnosis Date Noted  . Parkinson disease 05/09/2014  . Fever 05/05/2014  . Osteopenia 03/06/2014  . Postsurgical percutaneous transluminal coronary angioplasty status 08/23/2013  . Healed myocardial infarct 08/23/2013  . Obstructive apnea 08/23/2013  . Artificial cardiac pacemaker 08/23/2013  . Unspecified vitamin D deficiency 02/03/2013  . B12 deficiency 02/03/2013  . Dizziness 06/10/2012  . Dermatochalasis of eyelid 03/04/2012  . Error, refractive, myopia 03/04/2012  . Posterior vitreous detachment 03/04/2012  . Pseudoaphakia 03/04/2012  . Atrioventricular block, complete   . Idiopathic Parkinson's disease 08/29/2011  . Hematoma of chest wall 08/26/2011  . Hyperlipidemia 08/22/2011  . CAD (coronary artery disease) 08/22/2011  . Dementia 08/22/2011  . Atrial fibrillation 01/21/2011  . CARDIAC PACEMAKER IN SITU 01/21/2011  . PARKINSON'S DISEASE 11/18/2007  . MYOCARDIAL INFARCTION 11/18/2007  . 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 SLEEP APNEA 11/18/2007    Past Medical History:  Past Medical History  Diagnosis Date  . Parkinson disease   . Bradycardia     syncope due to prolonged pauses, resolved with PPM  . Sleep apnea   . Hypertension   . Dermatitis   . Depression   . Rib fractures 2011    S/p fall  . Maxillary fracture 2009    Right  . BPH (benign prostatic hypertrophy)   . Coronary artery disease 1986    Inferior wall MI treated with Angioplasty  . Normal echocardiogram 2007    Mild LVH with Impaired relaxation, Mild-Mod. Aortic Root dilation, Mild Aortic Sclerosis, Mild MR, Mild TR, Ef-50-55%  . Normal nuclear  stress test 2006    Normal EF-54%  . CAD (coronary artery disease)   . Inferior MI   . 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 Maxillary fracture 2009    Right side  . Rib fracture 07/2010  . Atrioventricular block, complete   . Pacemaker   . H/O hiatal hernia    Past Surgical History:  Past Surgical History  Procedure Laterality Date  . Pacemaker insertion  10/28/10    MDT implanted by Dr Lovena Le  . Deep brain stimulator placement      Parkinsons  . Arthroscopic knee      Left knee  . Cataract surgery      Bilateral  . Inguinal hernia repair      Bilateral  . Cardiac catheterization      Multiple Angioplasties, last 2002  . Removal of ganglion cyst      right wrist  . Insertion / placement / revision neurostimulator    . Eye surgery    . Insert / replace / remove pacemaker      Clinical Impression: [name] is a 73 y.o. right-handed male with history significant for Parkinson's disease status post deep brain stimulator by Dr. Salomon Fick neurosurgery at [facility], [place], dementia. Patient lives with his wife used a rollator prior to admission and goes to the gym to participate in a Parkinson's program. Admitted 05/05/2014 with chills, fever and generalized weakness with increasing confusion. Reports of recent tooth extraction 2 days prior to admission. Chest x-ray negative. Cranial CT scan with no acute intracranial abnormalities with bilateral deep brain stimulators  via high bifrontal Burr holes. Urine study 25,000 colony multi-species. Patient placed on broad-spectrum antibiotics and since changed to doxycycline. Blood cultures remain negative. Panorex negative for infection no reports of abscess. Subcutaneous Lovenox added for DVT prophylaxis. Patient remains on Sinemet for history of Parkinson's disease. Seroquel added each bedtime for bouts of restlessness. Currently maintained on a dysphagia 2 thin liquid diet. Marland Kitchen Physical therapy evaluation completed  05/07/2014 with recommendations for physical medicine rehabilitation consult. Patient was admitted for comprehensive rehabilitation program. Patient transferred to CIR on 05/09/2014 .    Patient currently requires mod > total assist with basic self-care skills secondary to muscle weakness and muscle joint tightness, decreased coordination and decreased motor planning, decreased awareness, decreased problem solving, decreased safety awareness, decreased memory and delayed processing and decreased sitting balance, decreased standing balance, decreased postural control and decreased balance strategies.  Prior to hospitalization, patient would use a rollator for functional mobility/ambulation. Patient poor historian and unable to discuss PLOF with therapist; no family present.   Patient will benefit from skilled intervention to increase independence with basic self-care skills prior to discharge home with care partner.  Anticipate patient will require 24 hour supervision and follow up home health.  OT - End of Session Activity Tolerance: Tolerates 10 - 20 min activity with multiple rests Endurance Deficit: Yes OT Assessment Rehab Potential: Good Barriers to Discharge:  (none known at this time) OT Patient demonstrates impairments in the following area(s): Balance;Cognition;Endurance;Motor;Pain;Perception;Safety;Skin Integrity OT Basic ADL's Functional Problem(s): Grooming;Bathing;Toileting;Dressing OT Advanced ADL's Functional Problem(s):  (n/a at this time) OT Transfers Functional Problem(s): Toilet;Tub/Shower OT Additional Impairment(s): None (functional strenghtneing>BUEs) OT Plan OT Intensity: Minimum of 1-2 x/day, 45 to 90 minutes OT Frequency: 5 out of 7 days OT Duration/Estimated Length of Stay: 10-14 days OT Treatment/Interventions: Balance/vestibular training;Cognitive remediation/compensation;Community reintegration;Discharge planning;DME/adaptive equipment instruction;Disease  mangement/prevention;Functional mobility training;Neuromuscular re-education;Pain management;Patient/family education;Psychosocial support;Self Care/advanced ADL retraining;Skin care/wound managment;Splinting/orthotics;Therapeutic Activities;Therapeutic Exercise;UE/LE Strength taining/ROM;UE/LE Coordination activities;Wheelchair propulsion/positioning OT Self Feeding Anticipated Outcome(s): independent OT Basic Self-Care Anticipated Outcome(s): supervision>min assist OT Toileting Anticipated Outcome(s): supervision>min assist OT Bathroom Transfers Anticipated Outcome(s): supervision>min assist OT Recommendation Recommendations for Other Services:  (none at this time) Patient destination: Home Follow Up Recommendations: Home health OT Equipment Recommended: 3 in 1 bedside comode;Tub/shower seat;Tub/shower bench Equipment Details: tub transfer bench vs shower seat  Precautions/Restrictions  Precautions Precautions: Fall Restrictions Weight Bearing Restrictions: No  General Chart Reviewed: Yes Family/Caregiver Present: No  Home Living/Prior Functioning Home Living Available Help at Discharge: Family;Personal care attendant;Available 24 hours/day (patient reports he has an aid Tuesdays & Thursdays for 6 hours) Type of Home: House Home Access: Stairs to enter [employer] of Steps: 2-3 Entrance Stairs-Rails: Right Home Layout: One level  Lives With: Spouse IADL History Homemaking Responsibilities: No Current License: No Occupation: Retired Prior Function Comments: pt normally walks with a rollator, performs transfers and ADLs on his own. he goes to the gym to participate in a Parkinson's program.  ADL - See FIM  Vision/Perception  Vision- History Baseline Vision/History: Wears glasses Wears Glasses: At all times Patient Visual Report: No change from baseline   Cognition Overall Cognitive Status: Impaired/Different from baseline Orientation Level: Oriented to  person;Oriented to place;Oriented to time;Oriented to situation Memory: Impaired Awareness: Impaired Problem Solving: Impaired Behaviors: Impulsive;Restless Safety/Judgment: Impaired  Sensation Sensation Additional Comments: BUEs appear intact Coordination Gross Motor Movements are Fluid and Coordinated: Yes Fine Motor Movements are Fluid and Coordinated: Yes  Motor  Motor Motor - Skilled Clinical Observations: Occasional resting tremor, rigidity  Mobility - See PT evaluation for more information  Trunk/Postural Assessment  - See PT evaluation for more information  Balance Balance Balance Assessed: Yes Static Standing Balance Static Standing - Balance Support: Bilateral upper extremity supported Static Standing - Level of Assistance: 1: +2 Total assist Static Standing - Comment/# of Minutes: 5  Extremity/Trunk Assessment RUE Assessment RUE Assessment: Within Functional Limits (can benefit from BUE functional strengthening) LUE Assessment LUE Assessment: Within Functional Limits (can benefit from BUE functional strengthening)  FIM:  FIM - Eating Eating Activity: 0: Activity did not occur FIM - Grooming Grooming Steps: Wash, rinse, dry face;Wash, rinse, dry hands;Oral care, brush teeth, clean dentures;Brush, comb hair Grooming: 4: Patient completes 3 of 4 or 4 of 5 steps (patient required min assist to complete grooming tasks) FIM - Bathing Bathing Steps Patient Completed: Chest;Right Arm;Left Arm;Abdomen;Right upper leg;Left upper leg Bathing: 3: Mod-Patient completes 5-7 [id number] parts or 50-74% FIM - Upper Body Dressing/Undressing Upper body dressing/undressing: 1: Total-Patient completed less than 25% of tasks FIM - Lower Body Dressing/Undressing Lower body dressing/undressing: 1: Total-Patient completed less than 25% of tasks FIM - Toileting Toileting: 1: Total-Patient completed zero steps, helper did all 3 FIM - Bed/Chair Transfer Bed/Chair Transfer: 4: Supine >  Sit: Min A (steadying Pt. > 75%/lift 1 leg);3: Bed > Chair or W/C: Mod A (lift or lower assist) FIM - TRadio producerDevices: Grab bars;Elevated toilet seat Toilet Transfers: 3-To toilet/BSC: Mod A (lift or lower assist);3-From toilet/BSC: Mod A (lift or lower assist) FIM - Tub/Shower Transfers Tub/shower Transfers: 0-Activity did not occur or was simulated   Refer to Care Plan for Long Term Goals  Recommendations for other services: None  Discharge Criteria: Patient will be discharged from OT if patient refuses treatment 3 consecutive times without medical reason, if treatment goals not met, if there is a change in medical status, if patient makes no progress towards goals or if patient is discharged from hospital.  The above assessment, treatment plan, treatment alternatives and goals were discussed and mutually agreed upon: by patient  ----------------------------------------------------------------------------------------------------------------------------  SESSION NOTES  Session #1 0900-1010 - 70 Minutes Individual Therapy No direct complaints of pain Initial 1:1 occupational therapy evaluation completed. Patient oriented X4. According to NT, patient with impulsive behaviors yesterday - trying to get out of the bed without assistance from staff. Therapist donned brief and bilateral TEDs while patient in supine. Patient then engaged in bed mobility with minimal assistance and moderate multimodal cues. Patient sat EOB, then transferred EOB>w/c with moderate assistance (stand pivot technique). Patient then completed UB/LB bathing & dressing in sit<>stand position at sink level. Patient with decreased motor planning/coordination and sequencing abilities when donning shirt, requiring total assistance for this task. Patient also exhibited decreased motor planning/coordination and sequencing for most functional tasks during this ADL session. Patient with request to  use bathroom, therapist propelled patient into bathroom and patient performed stand pivot transfer > toilet seat with moderate assistance and required total assistance for all toileting needs (peri care and clothing management). Patient with small incontinent episode in brief. Patient transferred off toilet > w/c with mod assist and therapist assisted patient > sink for some grooming tasks. At end of session, therapist donned quick release belt and propelled patient > nurses station secondary to report of impulsive behaviors.   Session #2 [id number]- 310Minutes Individual Therapy No complaints of pain Patient received seated in w/c with daughter and grandson present. Therapist discussed PLOF and d/c planning with  patient's daughter. When asked, patient stated he needed to use bathroom. Patient attempted to stand without an AD, therapist provided moderate multimodal cues for safety with transfer. Patient used grab bars to stand and perform clothing management prior to toileting, then sat on elevated toilet seat with moderate assistance. Patient transferred back to w/c with moderate assistance, then therapist propelled patient > sink for grooming task of washing hands in standing. Patient requires steady assist in standing. Therapist then administered theraband and educated patient on BUE strengthening exercises, patient with poor carryover/memory of taught exercises. At end of session, left patient seated in w/c with quick release belt donned and all needs within reach; patient's daughter and grandson present.  Merrill Deanda 05/10/2014, 3:31 PM

## 2014-05-10 NOTE — Progress Notes (Signed)
73 y.o. right-handed male with history significant for Parkinson's disease status post deep brain stimulator by Dr. Salomon Fick neurosurgery at [facility], [employer], dementia. Patient lives with his wife used a rollator prior to admission and goes to the gym to participate in a Parkinson's program. Admitted 05/05/2014 with chills, fever and generalized weakness with increasing confusion. Reports of recent tooth extraction 2 days prior to admission. Chest x-ray negative. Cranial CT scan with no acute intracranial abnormalities with bilateral deep brain stimulators via high bifrontal Burr holes  Subjective/Complaints: Sitting at nursing station Denies pain No SOB Voice hypophonic , hyperkinetic  Objective: Vital Signs: Blood pressure 124/81, pulse 60, temperature 97.7 F (36.5 C), temperature source Oral, resp. rate 18, height 5' 11" (1.803 m), weight 74.7 kg (164 lb 10.9 oz), SpO2 92.00%. No results found. Results for orders placed during the hospital encounter of 05/09/14 (from the past 72 hour(s))  CBC     Status: Abnormal   Collection Time    05/09/14  4:57 PM      Result Value Ref Range   WBC 3.8 (*) 4.0 - 10.5 K/uL   RBC 4.73  4.22 - 5.81 MIL/uL   Hemoglobin 13.2  13.0 - 17.0 g/dL   HCT 41.7  39.0 - 52.0 %   MCV 88.2  78.0 - 100.0 fL   MCH 27.9  26.0 - 34.0 pg   MCHC 31.7  30.0 - 36.0 g/dL   RDW 14.3  11.5 - 15.5 %   Platelets 317  150 - 400 K/uL  CREATININE, SERUM     Status: None   Collection Time    05/09/14  4:57 PM      Result Value Ref Range   Creatinine, Ser 0.63  0.50 - 1.35 mg/dL   GFR calc non Af Amer >90  >90 mL/min   GFR calc Af Amer >90  >90 mL/min   Comment: (NOTE)     The eGFR has been calculated using the CKD EPI equation.     This calculation has not been validated in all clinical situations.     eGFR's persistently <90 mL/min signify possible Chronic Kidney     Disease.  CBC WITH DIFFERENTIAL     Status: Abnormal   Collection Time    05/10/14  5:45  AM      Result Value Ref Range   WBC 5.9  4.0 - 10.5 K/uL   RBC 4.73  4.22 - 5.81 MIL/uL   Hemoglobin 13.1  13.0 - 17.0 g/dL   HCT 41.8  39.0 - 52.0 %   MCV 88.4  78.0 - 100.0 fL   MCH 27.7  26.0 - 34.0 pg   MCHC 31.3  30.0 - 36.0 g/dL   RDW 14.4  11.5 - 15.5 %   Platelets 319  150 - 400 K/uL   Neutrophils Relative % 54  43 - 77 %   Neutro Abs 3.2  1.7 - 7.7 K/uL   Lymphocytes Relative 26  12 - 46 %   Lymphs Abs 1.5  0.7 - 4.0 K/uL   Monocytes Relative 13 (*) 3 - 12 %   Monocytes Absolute 0.8  0.1 - 1.0 K/uL   Eosinophils Relative 6 (*) 0 - 5 %   Eosinophils Absolute 0.4  0.0 - 0.7 K/uL   Basophils Relative 1  0 - 1 %   Basophils Absolute 0.0  0.0 - 0.1 K/uL  COMPREHENSIVE METABOLIC PANEL     Status: Abnormal   Collection Time  05/10/14  5:45 AM      Result Value Ref Range   Sodium 141  137 - 147 mEq/L   Potassium 4.2  3.7 - 5.3 mEq/L   Chloride 102  96 - 112 mEq/L   CO2 28  19 - 32 mEq/L   Glucose, Bld 91  70 - 99 mg/dL   BUN 11  6 - 23 mg/dL   Creatinine, Ser 0.83  0.50 - 1.35 mg/dL   Calcium 9.0  8.4 - 10.5 mg/dL   Total Protein 6.7  6.0 - 8.3 g/dL   Albumin 3.5  3.5 - 5.2 g/dL   AST 25  0 - 37 U/L   ALT 13  0 - 53 U/L   Alkaline Phosphatase 82  39 - 117 U/L   Total Bilirubin 0.5  0.3 - 1.2 mg/dL   GFR calc non Af Amer 84 (*) >90 mL/min   GFR calc Af Amer >90  >90 mL/min   Comment: (NOTE)     The eGFR has been calculated using the CKD EPI equation.     This calculation has not been validated in all clinical situations.     eGFR's persistently <90 mL/min signify possible Chronic Kidney     Disease.      Vitals reviewed.  HENT:  Head: Normocephalic.  Eyes: EOM are normal.  Neck: Normal range of motion. Neck supple. No thyromegaly present.  Cardiovascular: Normal rate and regular rhythm.  Respiratory: Effort normal and breath sounds normal. No respiratory distress.  GI: Soft. Bowel sounds are normal. He exhibits no distension.  Neurological:  Alert male in  no acute distress. Speech hypophoic hyperkietic dysarthria He was able to provide his name and date of birth. Limited medical historian  Skin: Skin is warm and dry.  motor 5/5 in B UE and BLE  chorea noted LUE  Cerebellar intact  No cogwheeling    Assessment/Plan: 1. Functional deficits secondary to Parkinson's disease which require 3+ hours per day of interdisciplinary therapy in a comprehensive inpatient rehab setting. Physiatrist is providing close team supervision and 24 hour management of active medical problems listed below. Physiatrist and rehab team continue to assess barriers to discharge/monitor patient progress toward functional and medical goals. FIM:                   Comprehension Comprehension Mode: Auditory Comprehension: 2-Understands basic 25 - 49% of the time/requires cueing 51 - 75% of the time  Expression Expression Mode: Verbal Expression: 2-Expresses basic 25 - 49% of the time/requires cueing 50 - 75% of the time. Uses single words/gestures.  Social Interaction Social Interaction: 2-Interacts appropriately 25 - 49% of time - Needs frequent redirection.  Problem Solving Problem Solving: 1-Solves basic less than 25% of the time - needs direction nearly all the time or does not effectively solve problems and may need a restraint for safety  Memory Memory: 1-Recognizes or recalls less than 25% of the time/requires cueing greater than 75% of the time   Medical Problem List and Plan:  1. Functional deficits secondary to Parkinson's disease with decline in functional status after fever of unknown etiology. Continue Sinemet as directed for Parkinson disease  2. DVT Prophylaxis/Anticoagulation: Lovenox. Monitor platelet counts and any signs of bleeding  3. Pain Management: Tylenol as needed  4. Mood/dementia: Aricept 10 mg daily, Lexapro 10 mg daily, Seroquel 25 mg each bedtime. Bed alarm for safety discuss cognitive baseline with family  5. Neuropsych:  This patient is not capable   of making decisions on his own behalf.  6. Dysphagia. Dysphagia 2 thin liquid diet  7. Recent tooth extraction. Continue doxycycline for now  8. Overactive bladder..Myrbetriq 50 mg daily, Flomax 0.4 mg daily. Check PVRs x3  9. Hyperlipidemia. Zetia   LOS (Days) 1 A FACE TO FACE EVALUATION WAS PERFORMED  , E 05/10/2014, 10:06 AM    

## 2014-05-11 ENCOUNTER — Inpatient Hospital Stay (HOSPITAL_COMMUNITY): Payer: Medicare Other

## 2014-05-11 ENCOUNTER — Inpatient Hospital Stay (HOSPITAL_COMMUNITY): Payer: Medicare Other | Admitting: Speech Pathology

## 2014-05-11 ENCOUNTER — Inpatient Hospital Stay (HOSPITAL_COMMUNITY): Payer: Medicare Other | Admitting: Occupational Therapy

## 2014-05-11 ENCOUNTER — Encounter (HOSPITAL_COMMUNITY): Payer: Medicare Other | Admitting: Occupational Therapy

## 2014-05-11 DIAGNOSIS — G20 Parkinson's disease: Secondary | ICD-10-CM

## 2014-05-11 LAB — CULTURE, BLOOD (ROUTINE X 2)
CULTURE: NO GROWTH
Culture: NO GROWTH

## 2014-05-11 NOTE — Care Management Note (Signed)
[facility] Individual Statement of Services  Patient Name:  [name]  Date:  05/11/2014  Welcome to the [facility].  Our goal is to provide you with an individualized program based on your diagnosis and situation, designed to meet your specific needs.  With this comprehensive rehabilitation program, you will be expected to participate in at least 3 hours of rehabilitation therapies Monday-Friday, with modified therapy programming on the weekends.  Your rehabilitation program will include the following services:  Physical Therapy (PT), Occupational Therapy (OT), Speech Therapy (ST), 24 hour per day rehabilitation nursing, Therapeutic Recreaction (TR), Case Management (Social Worker), Rehabilitation Medicine, Nutrition Services and Pharmacy Services  Weekly team conferences will be held on Tuesdays to discuss your progress.  Your Social Worker will talk with you frequently to get your input and to update you on team discussions.  Team conferences with you and your family in attendance may also be held.  Expected length of stay: 10-14 days  Overall anticipated outcome: supervision  Depending on your progress and recovery, your program may change. Your Social Worker will coordinate services and will keep you informed of any changes. Your Social Worker's name and contact numbers are listed  below.  The following services may also be recommended but are not provided by the In[facility]:    Home Health Rehabiltiation Services  Outpatient Rehabilitation Services    Arrangements will be made to provide these services after discharge if needed.  Arrangements include referral to agencies that provide these services.  Your insurance has been verified to be:  Medicare and BCBS of [place] Your primary doctor is:  Dr. Neena Rhymes  Pertinent information will be shared with your doctor and your insurance company.  Social Worker:  [place], [place] [phone] or ([phone]   Information discussed with and copy given to patient by: Amada Jupiter, 05/11/2014, 2:27 PM

## 2014-05-11 NOTE — Progress Notes (Signed)
Speech Language Pathology Daily Session Note  Patient Details  Name: [name] MRN: [id number] Date of Birth: 10/22/1941  Today's Date: 05/11/2014 Time: 4128-7867 Time Calculation (min): 40 min  Short Term Goals: Week 1: SLP Short Term Goal 1 (Week 1): Pt will improve coordination of breath support with articulation for 80% speech intelligibilty in conversations with mod assist  SLP Short Term Goal 2 (Week 1): Pt will tolerate his currently prescribed diet with no overt s/s of aspiraiton and min cuing for use of swallowing precautions  SLP Short Term Goal 3 (Week 1): Pt will use external aids to facilitate improved orientaiton to place, date, and situation as well as improved recall of daily events and information for 75% accuracy with mod assist.  SLP Short Term Goal 4 (Week 1): Pt will identify 2 environmental modifications that he can use to faciltate improved speech intelligibility during conversations with skilled and unskilled communication partners.   SLP Short Term Goal 5 (Week 1): Pt will sustain attention to basic, familiar tasks for 15-20 minutes with min cuing for redirection.   SLP Short Term Goal 6 (Week 1): Pt will tolerate trials of upgraded textures with no overt s/s of aspiration.   Skilled Therapeutic Interventions:  Pt was seen for skilled speech therapy targeting dysphagia management, sustained attention, and orientation/recall of daily events and information.  SLP completed skilled observations during presentations of his currently prescribed diet with pt exhibiting effective oral manipulation and clearance of dys 2 solids, no overt s/s of aspiration with solid or liquid consistencies. SLP also facilitated session with trials of regular solids with pt exhibiting an immediate cough x2 following mixed solid and liquid consistencies.  No overt s/s of aspiration noted with solid cracker consistencies alone.  Suspect cough with mixed consistencies was related to pharyngeal  residue resulting from what appeared to be a delayed swallow initiation and decreased laryngeal elevation.  [name] provided SLP with a physician's summary of pt's most recent MBS (09/20/2013) with the following findings "The patient ingests all thicknesses of liquid barium present and barium coated cracker without difficulty.  No aspiration or penetration of the upper airway is seen."  Continue per current plan of care.  SLP will continue to follow up for diet advancement.    FIM:  Comprehension Comprehension Mode: Auditory Comprehension: 4-Understands basic 75 - 89% of the time/requires cueing 10 - 24% of the time Expression Expression Mode: Verbal Expression: 2-Expresses basic 25 - 49% of the time/requires cueing 50 - 75% of the time. Uses single words/gestures. Social Interaction Social Interaction: 4-Interacts appropriately 75 - 89% of the time - Needs redirection for appropriate language or to initiate interaction. Problem Solving Problem Solving: 2-Solves basic 25 - 49% of the time - needs direction more than half the time to initiate, plan or complete simple activities Memory Memory: 2-Recognizes or recalls 25 - 49% of the time/requires cueing 51 - 75% of the time FIM - Eating Eating Activity: 5: Supervision/cues  Pain Pain Assessment Pain Assessment: No/denies pain  Therapy/Group: Individual Therapy  Jackalyn Lombard, M.A. CCC-SLP  Lisseth Brazeau, Melanee Spry 05/11/2014, 12:26 PM

## 2014-05-11 NOTE — Progress Notes (Signed)
Physical Therapy Session Note  Patient Details  Name: [name] MRN: [id number] Date of Birth: 27-Jul-1941  Today's Date: 05/11/2014 Time: 1000-1100 Time Calculation (min): 60 min  Short Term Goals: Week 1:  PT Short Term Goal 1 (Week 1): Pt will ambulate 62' w/ LRAD w/ MinA PT Short Term Goal 2 (Week 1): Pt will transfers bed to/from w/c w/ LRAD w/ MinA PT Short Term Goal 3 (Week 1): Pt will propel w/c 50' in controlled environment w/ MinA and min cues PT Short Term Goal 4 (Week 1): Pt will perform all bed mobility with MinA  Skilled Therapeutic Interventions/Progress Updates:   Pt. Supine in bed with HOB elevated. Pt. Agreeable to therapy..."call me Nadine Counts." Pt. Oriented to location, but not to day of week. Speech patterns become difficult to understand with fatigue.  Gait: 73' x 2 with RW +2 progressing to Mod A with constant verbal cues for step width. Pt. Displays initial posterior lean transitioning to flexed trunk posture to establish COG over BOS for stability. Pt. Has difficulty navigating with RW; Pt. Able to demonstrate moderate stability with personal rolator near session-end. Rehab Tech assisted in verbal cueing to help pt. Stay in wide stance during ambulation.  Therapeutic Activities: Transfers: Supine<>sit x 3 supervision for safety, sit<>stand x 6 min to mod A for stability and safety due to intiial posterior lean. Bed mobility: x2 with pt self-positioning with verbal cues for proper technique (head of bed, edge of bed) at supervision.  Self Care: x1 Toileting with steady for donning/doffing garments. Pt. Requires supervision to mod A during transfers mainly due to initial LOB upon standing. Pt. Performed pericare independently with one verbal cue for handwashing post-toileting.  Pt. Supine with HOB elevated; wife present and providing PLOF information related to current condition. OT present and will continue patient care and provide needs.  Therapy  Documentation Precautions:  Precautions Precautions: Fall Restrictions Weight Bearing Restrictions: No  Pain: Pain Assessment Pain Assessment: No/denies pain  Locomotion : Ambulation Ambulation/Gait Assistance: 3: Mod assist   See FIM for current functional status  Therapy/Group: Individual Therapy  [name] 05/11/2014, 12:37 PM

## 2014-05-11 NOTE — Progress Notes (Signed)
Occupational Therapy Session Notes  Patient Details  Name: [name] MRN: [id number] Date of Birth: Mar 02, 1941  Today's Date: 05/11/2014  Short Term Goals: Week 1:  OT Short Term Goal 1 (Week 1): Patient will perform UB/LB bathing with minimal assistance OT Short Term Goal 2 (Week 1): Patient will don shirt with min assist OT Short Term Goal 3 (Week 1): Patient will perform LB dressing with moderate assistance OT Short Term Goal 4 (Week 1): Patient will be educated on a BUE functional strengthening HEP  Skilled Therapeutic Interventions/Progress Updates:   Session #1 [id number] - 60 Minutes Individual Therapy No complaints of pain Patient received supine in bed with PT leaving room and wife present in room. Patient engaged in bed mobility with moderate multimodal cues, then transferred EOB>w/c. Therapist then propelled patient into bathroom for shower stall transfer onto tub transfer bench. UB/LB bathing completed in sit<>stand position with minimal assistance and moderate multimodal cues to initiate & sequence through task. Patient transferred bench>w/c with min assist and patient performed UB/LB dressing in sit<>stand position. Patient took more than a reasonable amount of time to complete tasks and required up to moderate multimodal cues to stay on task, initiate task, and sequence through tasks. Patient continues to present with impulsive behaviors, therefore therapist donned quick release belt at end of session and educated wife on quick release belt and calling for assistance as needed. At end of session left patient seated in w/c with all needs within reach and wife present. Patient and wife concerned about medication management, notified RN of this.   Session #2 [id number] - 30 Minutes Individual Therapy No complaints of pain Patient received seated in w/c with wife present. Therapist propelled patient from room > therapy gym. Engaged patient in therapeutic activity focusing on  sit<>stands, dynamic standing balance/tolerance/endurance, and overall activity tolerance/endurance. Engaged patient in pipe tree activity and according to wife, patient is a good Environmental consultant" and enjoys such activities. About 5 minutes into activity, patient with increased confusion. Therapist encouraged patient to sit in w/c, patient required moderate multimodal cues to sit. Wife mentioned that patient has been up most of the day. Therapist propelled patient back to room and assisted patient > elevated toilet seat for toileting; patient able to complete most of this task with steady assist. After toileting, therapist propelled patient back to sink for grooming task of washing hands and brushing teeth. RN present towards end of session, therapist left patient at sink for grooming task with RN present. Therapist discussed recommendation of patient getting back to bed to rest secondary to increased fatigue during therapy session; RN agreed.   Precautions:  Precautions Precautions: Fall Restrictions Weight Bearing Restrictions: No  See FIM for current functional status  Jostin Rue 05/11/2014, 7:23 AM

## 2014-05-11 NOTE — Progress Notes (Signed)
73 y.o. right-handed male with history significant for Parkinson's disease status post deep brain stimulator by Dr. Salomon Fick neurosurgery at [facility] [place], dementia. Patient lives with his wife used a rollator prior to admission and goes to the gym to participate in a Parkinson's program. Admitted 05/05/2014 with chills, fever and generalized weakness with increasing confusion. Reports of recent tooth extraction 2 days prior to admission. Chest x-ray negative. Cranial CT scan with no acute intracranial abnormalities with bilateral deep brain stimulators via high bifrontal Burr holes  Subjective/Complaints: Pt in bed no c/o today Hypophonic Orient to person , "[employer] Review of Systems - Unable to obtain secondary to medical status  Objective: Vital Signs: Blood pressure 120/81, pulse 70, temperature 97.4 F (36.3 C), temperature source Oral, resp. rate 18, height _0  (1.803 m), weight 75.3 kg (166 lb 0.1 oz), SpO2 91.00%. No results found. Results for orders placed during the hospital encounter of 05/09/14 (from the past 72 hour(s))  CBC     Status: Abnormal   Collection Time    05/09/14  4:57 PM      Result Value Ref Range   WBC 3.8 (*) 4.0 - 10.5 K/uL   RBC 4.73  4.22 - 5.81 MIL/uL   Hemoglobin 13.2  13.0 - 17.0 g/dL   HCT 41.7  39.0 - 52.0 %   MCV 88.2  78.0 - 100.0 fL   MCH 27.9  26.0 - 34.0 pg   MCHC 31.7  30.0 - 36.0 g/dL   RDW 14.3  11.5 - 15.5 %   Platelets 317  150 - 400 K/uL  CREATININE, SERUM     Status: None   Collection Time    05/09/14  4:57 PM      Result Value Ref Range   Creatinine, Ser 0.63  0.50 - 1.35 mg/dL   GFR calc non Af Amer >90  >90 mL/min   GFR calc Af Amer >90  >90 mL/min   Comment: (NOTE)     The eGFR has been calculated using the CKD EPI equation.     This calculation has not been validated in all clinical situations.     eGFR's persistently <90 mL/min signify possible Chronic Kidney     Disease.  CBC WITH DIFFERENTIAL      Status: Abnormal   Collection Time    05/10/14  5:45 AM      Result Value Ref Range   WBC 5.9  4.0 - 10.5 K/uL   RBC 4.73  4.22 - 5.81 MIL/uL   Hemoglobin 13.1  13.0 - 17.0 g/dL   HCT 41.8  39.0 - 52.0 %   MCV 88.4  78.0 - 100.0 fL   MCH 27.7  26.0 - 34.0 pg   MCHC 31.3  30.0 - 36.0 g/dL   RDW 14.4  11.5 - 15.5 %   Platelets 319  150 - 400 K/uL   Neutrophils Relative % 54  43 - 77 %   Neutro Abs 3.2  1.7 - 7.7 K/uL   Lymphocytes Relative 26  12 - 46 %   Lymphs Abs 1.5  0.7 - 4.0 K/uL   Monocytes Relative 13 (*) 3 - 12 %   Monocytes Absolute 0.8  0.1 - 1.0 K/uL   Eosinophils Relative 6 (*) 0 - 5 %   Eosinophils Absolute 0.4  0.0 - 0.7 K/uL   Basophils Relative 1  0 - 1 %   Basophils Absolute 0.0  0.0 - 0.1 K/uL  COMPREHENSIVE METABOLIC PANEL     Status: Abnormal   Collection Time    05/10/14  5:45 AM      Result Value Ref Range   Sodium 141  137 - 147 mEq/L   Potassium 4.2  3.7 - 5.3 mEq/L   Chloride 102  96 - 112 mEq/L   CO2 28  19 - 32 mEq/L   Glucose, Bld 91  70 - 99 mg/dL   BUN 11  6 - 23 mg/dL   Creatinine, Ser 0.83  0.50 - 1.35 mg/dL   Calcium 9.0  8.4 - 10.5 mg/dL   Total Protein 6.7  6.0 - 8.3 g/dL   Albumin 3.5  3.5 - 5.2 g/dL   AST 25  0 - 37 U/L   ALT 13  0 - 53 U/L   Alkaline Phosphatase 82  39 - 117 U/L   Total Bilirubin 0.5  0.3 - 1.2 mg/dL   GFR calc non Af Amer 84 (*) >90 mL/min   GFR calc Af Amer >90  >90 mL/min   Comment: (NOTE)     The eGFR has been calculated using the CKD EPI equation.     This calculation has not been validated in all clinical situations.     eGFR's persistently <90 mL/min signify possible Chronic Kidney     Disease.      Vitals reviewed.  HENT:  Head: Normocephalic.  Eyes: EOM are normal.  Neck: Normal range of motion. Neck supple. No thyromegaly present.  Cardiovascular: Normal rate and regular rhythm.  Respiratory: Effort normal and breath sounds normal. No respiratory distress.  GI: Soft. Bowel sounds are normal.  He exhibits no distension.  Neurological:  Alert male in no acute distress. Speech hypophoic hyperkietic dysarthria He was able to provide his name and date of birth. Limited medical historian  Skin: Skin is warm and dry.  motor 5/5 in B UE and BLE  chorea noted LUE  Cerebellar intact  No cogwheeling    Assessment/Plan: 1. Functional deficits secondary to Parkinson's disease which require 3+ hours per day of interdisciplinary therapy in a comprehensive inpatient rehab setting. Physiatrist is providing close team supervision and 24 hour management of active medical problems listed below. Physiatrist and rehab team continue to assess barriers to discharge/monitor patient progress toward functional and medical goals. FIM: FIM - Bathing Bathing Steps Patient Completed: Chest;Right Arm;Left Arm;Abdomen;Right upper leg;Left upper leg Bathing: 3: Mod-Patient completes 5-7 23f10 parts or 50-74%  FIM - Upper Body Dressing/Undressing Upper body dressing/undressing: 1: Total-Patient completed less than 25% of tasks FIM - Lower Body Dressing/Undressing Lower body dressing/undressing: 1: Total-Patient completed less than 25% of tasks  FIM - Toileting Toileting: 1: Total-Patient completed zero steps, helper did all 3  FIM - TRadio producerDevices: Grab bars;Elevated toilet seat Toilet Transfers: 3-To toilet/BSC: Mod A (lift or lower assist);3-From toilet/BSC: Mod A (lift or lower assist)  FIM - Bed/Chair Transfer Bed/Chair Transfer: 0: Activity did not occur  FIM - Locomotion: Wheelchair Distance: 20 Locomotion: Wheelchair: 1: Travels less than 50 ft with maximal assistance (Pt: 25 - 49%) FIM - Locomotion: Ambulation Locomotion: Ambulation Assistive Devices: WAdministratorAmbulation/Gait Assistance: 1: +2 Total assist Locomotion: Ambulation: 1: Two helpers  Comprehension Comprehension Mode: Auditory Comprehension: 3-Understands basic 50 - 74% of the  time/requires cueing 25 - 50%  of the time  Expression Expression Mode: Verbal Expression: 2-Expresses basic 25 - 49% of the time/requires cueing 50 - 75% of the  time. Uses single words/gestures.  Social Interaction Social Interaction: 4-Interacts appropriately 75 - 89% of the time - Needs redirection for appropriate language or to initiate interaction.  Problem Solving Problem Solving: 2-Solves basic 25 - 49% of the time - needs direction more than half the time to initiate, plan or complete simple activities  Memory Memory: 1-Recognizes or recalls less than 25% of the time/requires cueing greater than 75% of the time   Medical Problem List and Plan:  1. Functional deficits secondary to Parkinson's disease with decline in functional status after fever of unknown etiology. Continue Sinemet as directed for Parkinson disease  2. DVT Prophylaxis/Anticoagulation: Lovenox. Monitor platelet counts and any signs of bleeding  3. Pain Management: Tylenol as needed  4. Mood/dementia: Aricept 10 mg daily, Lexapro 10 mg daily, Seroquel 25 mg each bedtime. Bed alarm for safety discuss cognitive baseline with family  5. Neuropsych: This patient is not capable of making decisions on his own behalf.  6. Dysphagia. Dysphagia 2 thin liquid diet  7. Recent tooth extraction. Continue doxycycline for now  8. Overactive bladder..Myrbetriq 50 mg daily, Flomax 0.4 mg daily. Check PVRs x3  9. Hyperlipidemia. Zetia   LOS (Days) 2 A FACE TO FACE EVALUATION WAS PERFORMED  Hebah Bogosian E 05/11/2014, 10:41 AM

## 2014-05-11 NOTE — Plan of Care (Signed)
Problem: RH Wheelchair Mobility Goal: LTG Patient will propel w/c in controlled environment (PT) LTG: Patient will propel wheelchair in controlled environment, # of feet with assist (PT)  Goal downgraded to supervision 05/11/14 after consultation with colleague.  Goal: LTG Patient will propel w/c in community environment (PT) LTG: Patient will propel wheelchair in community environment, # of feet with assist (PT)  Goal downgraded 05/11/14 after consultation with colleague.

## 2014-05-12 ENCOUNTER — Ambulatory Visit (HOSPITAL_COMMUNITY): Payer: Medicare Other | Admitting: Speech Pathology

## 2014-05-12 ENCOUNTER — Encounter (HOSPITAL_COMMUNITY): Payer: Medicare Other | Admitting: Occupational Therapy

## 2014-05-12 ENCOUNTER — Inpatient Hospital Stay (HOSPITAL_COMMUNITY): Payer: Medicare Other

## 2014-05-12 MED ORDER — DOCUSATE SODIUM 100 MG PO CAPS
100.0000 mg | ORAL_CAPSULE | Freq: Every day | ORAL | Status: DC
  Administered 2014-05-13 – 2014-05-24 (×11): 100 mg via ORAL
  Filled 2014-05-12 (×13): qty 1

## 2014-05-12 NOTE — IPOC Note (Signed)
Overall Plan of Care [facility](IPOC) Patient Details Name: [name] BogusRobert J Jamerson MRN: [id number] DOB: Dec 19, 1940  Admitting Diagnosis: Parkinson's  Hospital Problems: Active Problems:   Parkinson disease     Functional Problem List: Nursing Bladder;Bowel;Endurance;Medication Management;Motor;Safety;Skin Integrity  PT Balance;Perception;Safety;Endurance;Motor  OT Balance;Cognition;Endurance;Motor;Pain;Perception;Safety;Skin Integrity  SLP Cognition;Safety;Nutrition  TR         Basic ADL's: OT Grooming;Bathing;Toileting;Dressing     Advanced  ADL's: OT  (n/a at this time)     Transfers: PT Bed Mobility;Bed to Chair;Car;Furniture  OT Toilet;Tub/Shower     Locomotion: PT Ambulation;Wheelchair Mobility;Stairs     Additional Impairments: OT None (functional strenghtneing>BUEs)  SLP Swallowing;Communication;Social Cognition expression Problem Solving;Memory;Attention;Awareness  TR      Anticipated Outcomes Item Anticipated Outcome  Self Feeding independent  Swallowing  supervision with least restrictive diet    Basic self-care  supervision>min assist  Toileting  supervision>min assist   Bathroom Transfers supervision>min assist  Bowel/Bladder  Manage bowel and bladder with pads and briefs  Transfers  supervision  Locomotion  supervision  Communication  min assist for speech intelligibility   Cognition  min assist   Pain  </=3  Safety/Judgment  No falls with injury   Therapy Plan: PT Intensity: Minimum of 1-2 x/day ,45 to 90 minutes PT Frequency: 5 out of 7 days PT Duration Estimated Length of Stay: 10-14 days OT Intensity: Minimum of 1-2 x/day, 45 to 90 minutes OT Frequency: 5 out of 7 days OT Duration/Estimated Length of Stay: 10-14 days SLP Intensity: Minumum of 1-2 x/day, 30 to 90 minutes SLP Frequency: 5 out of 7 days SLP Duration/Estimated Length of Stay: 10-14 days       Team Interventions: Nursing Interventions Patient/Family Education;Bladder  Management;Bowel Management;Pain Management;Medication Management;Skin Care/Wound Management;Cognitive Remediation/Compensation;Psychosocial Support  PT interventions Ambulation/gait training;Balance/vestibular training;Community reintegration;Cognitive remediation/compensation;Discharge planning;Disease management/prevention;DME/adaptive equipment instruction;Functional mobility training;Neuromuscular re-education;Patient/family education;Stair training;Therapeutic Exercise;Therapeutic Activities;UE/LE Coordination activities;Wheelchair propulsion/positioning  OT Interventions Balance/vestibular training;Cognitive remediation/compensation;Community reintegration;Discharge planning;DME/adaptive equipment instruction;Disease mangement/prevention;Functional mobility training;Neuromuscular re-education;Pain management;Patient/family education;Psychosocial support;Self Care/advanced ADL retraining;Skin care/wound managment;Splinting/orthotics;Therapeutic Activities;Therapeutic Exercise;UE/LE Strength taining/ROM;UE/LE Coordination activities;Wheelchair propulsion/positioning  SLP Interventions Cognitive remediation/compensation;Dysphagia/aspiration precaution training;Functional tasks;Patient/family education;Oral motor exercises;Internal/external aids  TR Interventions    SW/CM Interventions Discharge Planning;Psychosocial Support;Patient/Family Education    Team Discharge Planning: Destination: PT-Home ,OT- Home , SLP-Home Projected Follow-up: PT-Outpatient PT, OT-  Home health OT, SLP-Home Health SLP;24 hour supervision/assistance Projected Equipment Needs: PT-Rolling walker with 5" wheels, OT- 3 in 1 bedside comode;Tub/shower seat;Tub/shower bench, SLP-None recommended by SLP Equipment Details: PT-May adjust equipment recommendation pending progress, OT-tub transfer bench vs shower seat Patient/family involved in discharge planning: PT- Patient,  OT-Patient, SLP-Patient  MD ELOS: 10-14d Medical Rehab  Prognosis:  Good Assessment: 73 y.o. right-handed male with history significant for Parkinson's disease status post deep brain stimulator [facility] Ang[facility]atter [facility], CAD/pacemaker, dementia. Patient lives with his wife used a rollator prior to admission and goes to the gym to participate in a Parkinson's program. Admitted 05/05/2014 with chills, fever and generalized weakness with increasing confusion. Reports of recent tooth extraction 2 days prior to admission. Chest x-ray negative. Cranial CT scan with no acute intracranial abnormalities with bilateral deep brain stimulators via high bifrontal Burr holes. Urine study 25,000 colony multi-species. Patient placed on broad-spectrum antibiotics and since changed to doxycycline. Blood cultures remain negative. Panorex negative for infection no reports of abscess. Subcutaneous Lovenox added for DVT prophylaxis  Now requiring 24/7 Rehab RN,MD, as well as CIR level PT, OT and SLP.  Treatment team will focus on ADLs and  mobility with goals set at Sup/Min A    See Team Conference Notes for weekly updates to the plan of care

## 2014-05-12 NOTE — Progress Notes (Addendum)
Speech Language Pathology Daily Session Note  Patient Details  Name: [name] MRN: [id number] Date of Birth: [birth year]  Today's Date: 05/12/2014 Time: 4320-0379 Time Calculation (min): 44 min  Short Term Goals: Week 1: SLP Short Term Goal 1 (Week 1): Pt will improve coordination of breath support with articulation for 80% speech intelligibilty in conversations with mod assist  SLP Short Term Goal 2 (Week 1): Pt will tolerate his currently prescribed diet with no overt s/s of aspiraiton and min cuing for use of swallowing precautions  SLP Short Term Goal 3 (Week 1): Pt will use external aids to facilitate improved orientaiton to place, date, and situation as well as improved recall of daily events and information for 75% accuracy with mod assist.  SLP Short Term Goal 4 (Week 1): Pt will identify 2 environmental modifications that he can use to faciltate improved speech intelligibility during conversations with skilled and unskilled communication partners.   SLP Short Term Goal 5 (Week 1): Pt will sustain attention to basic, familiar tasks for 15-20 minutes with min cuing for redirection.   SLP Short Term Goal 6 (Week 1): Pt will tolerate trials of upgraded textures with no overt s/s of aspiration.   Skilled Therapeutic Interventions:  Pt was seen for skilled speech therapy targeting dysphagia management and recall of daily information.  SLP completed skilled observations with presentations of pt's currently prescribed diet with immediate cough x2 when consecutive bites of dys 2 textures were followed by cup sips of thin liquids.  Pt required mod verbal and tactile cues for multiple swallows between bites/sips resulting in no further s/s of aspiration.  Continue dys 2 textures with thin liquids and close supervision for use of extra swallows as pt has decreased working memory and recall of new information.  Also recommend completing meals in a quiet, minimally distracting environment to  facilitate improved sustained attention to self feeding tasks.  Pt would benefit from an MBS to objectively determine safest diet consistency in the setting of progressive illness and inconsistent s/s of aspiration noted at bedside.  To be completed with primary SLP at next available appointment.  SLP reoriented pt to daily schedule with min-mod cuing to locate targeted information to facilitate recall of daily events and information.  Pt answered targeted questions related to the daily schedule with approximately 50% accuracy secondary to perseveration which was improved to >75% accuracy with repetition of trials as part of spaced retrieval training.  Continue per current plan of care.  FIM:  Comprehension Comprehension Mode: Auditory Comprehension: 3-Understands basic 50 - 74% of the time/requires cueing 25 - 50%  of the time Expression Expression Mode: Verbal Expression: 3-Expresses basic 50 - 74% of the time/requires cueing 25 - 50% of the time. Needs to repeat parts of sentences. Social Interaction Social Interaction: 4-Interacts appropriately 75 - 89% of the time - Needs redirection for appropriate language or to initiate interaction. Problem Solving Problem Solving: 3-Solves basic 50 - 74% of the time/requires cueing 25 - 49% of the time Memory Memory: 3-Recognizes or recalls 50 - 74% of the time/requires cueing 25 - 49% of the time FIM - Eating Eating Activity: 5: Supervision/cues  Pain Pain Assessment Pain Assessment: No/denies pain Pain Score: 0-No pain  Therapy/Group: Individual Therapy  Jackalyn Lombard, M.A. CCC-SLP  Krystian Ferrentino, Joni Reining L 05/12/2014, 11:10 AM

## 2014-05-12 NOTE — Progress Notes (Signed)
Occupational Therapy Session Note  Patient Details  Name: [name] MRN: [id number] Date of Birth: 05-29-1941  Today's Date: 05/12/2014 Time: 6468-0321 Time Calculation (min): 55 min  Short Term Goals: Week 1:  OT Short Term Goal 1 (Week 1): Patient will perform UB/LB bathing with minimal assistance OT Short Term Goal 2 (Week 1): Patient will don shirt with min assist OT Short Term Goal 3 (Week 1): Patient will perform LB dressing with moderate assistance OT Short Term Goal 4 (Week 1): Patient will be educated on a BUE functional strengthening HEP  Skilled Therapeutic Interventions/Progress Updates:  Patient received seated in w/c near door "waiting on therapy". Patient oriented X4 and to this therapists name independently. Patient propelled self into bathroom and perofrmed toilet transfer & toileting needs, then shower stall transfer. UB/LB bathing completed in sit<>stand position with occasional steady assist during standing. Patient transferred out of shower stall>w/c with close supervision then performed UB/LB dressing in sit<>stand position from sink. See FIM for level of function. Patient continues to take more than a reasonable amount of time to complete tasks and requires up to moderate multimodal cues for safety, problem solving, memory, attention, and initiation. After ADL, patient worked on propelling self from room > gym, therapist providing moderate verbal cues for correct technique for BUE w/c propelling. Patient left seated in w/c in gym with quick release belt donned and PT present for next therapy session.  Precautions:  Precautions Precautions: Fall Restrictions Weight Bearing Restrictions: No  See FIM for current functional status  Therapy/Group: Individual Therapy  Leonila Speranza 05/12/2014, 10:45 AM

## 2014-05-12 NOTE — Progress Notes (Signed)
Social Work  Social Work Assessment and Plan  Patient Details  Name: [name] MRN: [id number] Date of Birth: 1941/07/04  Today's Date: 05/12/2014  Problem List:  Patient Active Problem List   Diagnosis Date Noted  . Parkinson disease 05/09/2014  . Fever 05/05/2014  . Osteopenia 03/06/2014  . Postsurgical percutaneous transluminal coronary angioplasty status 08/23/2013  . Healed myocardial infarct 08/23/2013  . Obstructive apnea 08/23/2013  . Artificial cardiac pacemaker 08/23/2013  . Unspecified vitamin D deficiency 02/03/2013  . B12 deficiency 02/03/2013  . Dizziness 06/10/2012  . Dermatochalasis of eyelid 03/04/2012  . Error, refractive, myopia 03/04/2012  . Posterior vitreous detachment 03/04/2012  . Pseudoaphakia 03/04/2012  . Atrioventricular block, complete   . Idiopathic Parkinson's disease 08/29/2011  . Hematoma of chest wall 08/26/2011  . Hyperlipidemia 08/22/2011  . CAD (coronary artery disease) 08/22/2011  . Dementia 08/22/2011  . Atrial fibrillation 01/21/2011  . CARDIAC PACEMAKER IN SITU 01/21/2011  . PARKINSON'S DISEASE 11/18/2007  . MYOCARDIAL INFARCTION 11/18/2007  . 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 SLEEP APNEA 11/18/2007   Past Medical History:  Past Medical History  Diagnosis Date  . Parkinson disease   . Bradycardia     syncope due to prolonged pauses, resolved with PPM  . Sleep apnea   . Hypertension   . Dermatitis   . Depression   . Rib fractures 2011    S/p fall  . Maxillary fracture 2009    Right  . BPH (benign prostatic hypertrophy)   . Coronary artery disease 1986    Inferior wall MI treated with Angioplasty  . Normal echocardiogram 2007    Mild LVH with Impaired relaxation, Mild-Mod. Aortic Root dilation, Mild Aortic Sclerosis, Mild MR, Mild TR, Ef-50-55%  . Normal nuclear stress test 2006    Normal EF-54%  . CAD (coronary artery disease)   . Inferior MI   . 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 Maxillary  fracture 2009    Right side  . Rib fracture 07/2010  . Atrioventricular block, complete   . Pacemaker   . H/O hiatal hernia    Past Surgical History:  Past Surgical History  Procedure Laterality Date  . Pacemaker insertion  10/28/10    MDT implanted by Dr Ladona Ridgel  . Deep brain stimulator placement      Parkinsons  . Arthroscopic knee      Left knee  . Cataract surgery      Bilateral  . Inguinal hernia repair      Bilateral  . Cardiac catheterization      Multiple Angioplasties, last 2002  . Removal of ganglion cyst      right wrist  . Insertion / placement / revision neurostimulator    . Eye surgery    . Insert / replace / remove pacemaker     Social History:  reports that he has never smoked. He does not have any smokeless tobacco history on file. He reports that he does not drink alcohol or use illicit drugs.  Family / Support Systems Marital Status: Married Patient Roles: Spouse;Parent (Has a wife and a daughter.) Spouse/Significant Other: wife, Zaiyden Brumbelow @ (H) [phone] or [phone] Children: one daughter, Toma Copier, living in [place] Anticipated Caregiver: wife and an aide who works 6 hrs 2 days a week. Ability/Limitations of Caregiver: wife can assist and has a hired caregiver 2 days a week. Caregiver Availability: 24/7 Family Dynamics: wife very involved and attentive  Social History Preferred language: English Religion:  [employer] Cultural Background: NA Education: college ed Read: Yes Write: Yes Employment Status: Retired Date Retired/Disabled/Unemployed: "early 2000's" Fish farm managerLegal Hisotry/Current Legal Issues: None Guardian/Conservator: None- per MD, pt not capable of making decisions on his own behalf - defer to spouse   Abuse/Neglect Physical Abuse: Denies Verbal Abuse: Denies Sexual Abuse: Denies Exploitation of patient/patient's resources: Denies Self-Neglect: Denies  Emotional Status Pt's affect, behavior adn adjustment status: Pt with  siginificant physical limitations due to PD and limitations to speech production/ intelligibility.  Despite efforts from pt to answer questions, I simply could not understand and had to defer to wife who would provide answer.  She does try to allow pt a chance to answer as he is able.  Cannot really assess emotional adjustment status other then per wife's perspective.  She does feel he is "only about 50% back to his old self in alot of ways" (including emotionally) She feels he is experience "alot of frustration" with his limitations. Recent Psychosocial Issues: None other then chronic health issues Pyschiatric History: none per wife Substance Abuse History: none  Patient / Family Perceptions, Expectations & Goals Pt/Family understanding of illness & functional limitations: Wife reports "I'm not sure what they feel really caused this (functional decline)...he had a fever but don't really know where it came from exactly..."  Wfie with very good understanding of pt's PD as he has "suffered with it" for 30+ years.  As noted, wife feels his overall functioning is only about 50 % of his premorbid capabilities both physciall and cognitively. Premorbid pt/family roles/activities: Wife reports minimal cognitive deficits PTA. Using walker to move about the home, however, sometimes would not.  Going to PD exercise class 2 times each week and "lots" of doctor's appointments. Anticipated changes in roles/activities/participation: wife may need to increased her caregiver support both physically and with cognition if pt does not reach supervision goals Pt/family expectations/goals: "I just hope he gets close to where he was..."  [employer] Community Agencies: Other (Comment) (PD exercise class - "[employer]") Premorbid Home Care/DME Agencies: None Transportation available at discharge: yes Resource referrals recommended: Support group (specify);Neuropsychology  Discharge Planning Living Arrangements:  Spouse/significant other Support Systems: Children;Spouse/significant other Type of Residence: Private residence Insurance Resources: Medicare;Private Insurance (specify) Herbalist(BCBS) Financial Resources: Social Security Financial Screen Referred: No Living Expenses: Own Money Management: Spouse Does the patient have any problems obtaining your medications?: No Home Management: wife Patient/Family Preliminary Plans: pt to return home with wife and private duty caregiver Social Work Anticipated Follow Up Needs: HH/OP Expected length of stay: 10-14 days  Clinical Impression Very impaired gentleman here following exacerbation and worsening of Parkinson's dx following fever.  Wife very involved and attentive and hopeful pt may regain back to prior functional level.  Very difficult to understand pt's speech and cannot fully assess emotional issues - will monitor as speech production improves.  Following for support and d/c planning needs.  Arnol Mcgibbon 05/12/2014, 11:41 AM

## 2014-05-12 NOTE — Progress Notes (Signed)
Physical Therapy Session Note  Patient Details  Name: ORVILL EDE MRN: [id number] Date of Birth: Feb 06, 1941  Today's Date: 05/12/2014 Time: 5697-9480 Time Calculation (min): 44 min  Short Term Goals: Week 1:  PT Short Term Goal 1 (Week 1): Pt will ambulate 65' w/ LRAD w/ MinA PT Short Term Goal 2 (Week 1): Pt will transfers bed to/from w/c w/ LRAD w/ MinA PT Short Term Goal 3 (Week 1): Pt will propel w/c 50' in controlled environment w/ MinA and min cues PT Short Term Goal 4 (Week 1): Pt will perform all bed mobility with MinA  Skilled Therapeutic Interventions/Progress Updates:  Pt. Received in gym in W/C. Pt. Agreeable to therapy; states he feels tired prior to tx. OT reports patient fatigue possibly caused by loose stools this a.m.  Therapeutic Activities: Transfers: x 6 sit<>stand with min A with verbal/tactile cues for safety and stability.  Bed mobility: log rolling L<>R x 2 Min A with tactile cues for positioning. Scooting to [facility] with BLE used to facilitate bridging of hips for weight shifting.   NMReEd: lateral side steping 6 x 10 feet in  bars min to mod A with occasional LOB due to sequencing, posture, and general fatigue. Pt. Static standing and static sitting for increased proprioceptive stability, balance control, and posture control min-mod A for support and stability with LOBs. Pt. Displays decreased ability to maintain balance with increasing purterbations; unable to incorporate appropriate balance strategies sequentially.  W/C mgmt: Pt. Propels w/c with BUE/BLE and requires constant verbal cues for proper technique and sequencing with min A for w/c control.  Pt. Returned to room supine in bed with all needs within reach. Pt. Quickly fell asleep.  Therapy Documentation Precautions:  Precautions Precautions: Fall Restrictions Weight Bearing Restrictions: No  Pain: Pain Assessment Pain Assessment: No/denies pain Pain Score: 0-No pain  See FIM for current  functional status  Therapy/Group: Individual Therapy  [name] 05/12/2014, 11:07 AM

## 2014-05-12 NOTE — Progress Notes (Signed)
73 y.o. right-handed male with history significant for Parkinson's disease status post deep brain stimulator by Dr. Salomon Fick neurosurgery at [facility], [place], dementia. Patient lives with his wife used a rollator prior to admission and goes to the gym to participate in a Parkinson's program. Admitted 05/05/2014 with chills, fever and generalized weakness with increasing confusion. Reports of recent tooth extraction 2 days prior to admission. Chest x-ray negative. Cranial CT scan with no acute intracranial abnormalities with bilateral deep brain stimulators via high bifrontal Burr holes  Subjective/Complaints: Pt in bed no c/o today Hypophonic Working with therapy at bedside Review of Systems - Unable to obtain secondary to medical status  Objective: Vital Signs: Blood pressure 137/75, pulse 61, temperature 97.1 F (36.2 C), temperature source Oral, resp. rate 18, height $RemoveBe'5\' 11"'djViAArjv$  (1.803 m), weight 75.3 kg (166 lb 0.1 oz), SpO2 92.00%. No results found. Results for orders placed during the hospital encounter of 05/09/14 (from the past 72 hour(s))  CBC     Status: Abnormal   Collection Time    05/09/14  4:57 PM      Result Value Ref Range   WBC 3.8 (*) 4.0 - 10.5 K/uL   RBC 4.73  4.22 - 5.81 MIL/uL   Hemoglobin 13.2  13.0 - 17.0 g/dL   HCT 41.7  39.0 - 52.0 %   MCV 88.2  78.0 - 100.0 fL   MCH 27.9  26.0 - 34.0 pg   MCHC 31.7  30.0 - 36.0 g/dL   RDW 14.3  11.5 - 15.5 %   Platelets 317  150 - 400 K/uL  CREATININE, SERUM     Status: None   Collection Time    05/09/14  4:57 PM      Result Value Ref Range   Creatinine, Ser 0.63  0.50 - 1.35 mg/dL   GFR calc non Af Amer >90  >90 mL/min   GFR calc Af Amer >90  >90 mL/min   Comment: (NOTE)     The eGFR has been calculated using the CKD EPI equation.     This calculation has not been validated in all clinical situations.     eGFR's persistently <90 mL/min signify possible Chronic Kidney     Disease.  CBC WITH DIFFERENTIAL      Status: Abnormal   Collection Time    05/10/14  5:45 AM      Result Value Ref Range   WBC 5.9  4.0 - 10.5 K/uL   RBC 4.73  4.22 - 5.81 MIL/uL   Hemoglobin 13.1  13.0 - 17.0 g/dL   HCT 41.8  39.0 - 52.0 %   MCV 88.4  78.0 - 100.0 fL   MCH 27.7  26.0 - 34.0 pg   MCHC 31.3  30.0 - 36.0 g/dL   RDW 14.4  11.5 - 15.5 %   Platelets 319  150 - 400 K/uL   Neutrophils Relative % 54  43 - 77 %   Neutro Abs 3.2  1.7 - 7.7 K/uL   Lymphocytes Relative 26  12 - 46 %   Lymphs Abs 1.5  0.7 - 4.0 K/uL   Monocytes Relative 13 (*) 3 - 12 %   Monocytes Absolute 0.8  0.1 - 1.0 K/uL   Eosinophils Relative 6 (*) 0 - 5 %   Eosinophils Absolute 0.4  0.0 - 0.7 K/uL   Basophils Relative 1  0 - 1 %   Basophils Absolute 0.0  0.0 - 0.1 K/uL  COMPREHENSIVE METABOLIC PANEL  Status: Abnormal   Collection Time    05/10/14  5:45 AM      Result Value Ref Range   Sodium 141  137 - 147 mEq/L   Potassium 4.2  3.7 - 5.3 mEq/L   Chloride 102  96 - 112 mEq/L   CO2 28  19 - 32 mEq/L   Glucose, Bld 91  70 - 99 mg/dL   BUN 11  6 - 23 mg/dL   Creatinine, Ser 0.83  0.50 - 1.35 mg/dL   Calcium 9.0  8.4 - 10.5 mg/dL   Total Protein 6.7  6.0 - 8.3 g/dL   Albumin 3.5  3.5 - 5.2 g/dL   AST 25  0 - 37 U/L   ALT 13  0 - 53 U/L   Alkaline Phosphatase 82  39 - 117 U/L   Total Bilirubin 0.5  0.3 - 1.2 mg/dL   GFR calc non Af Amer 84 (*) >90 mL/min   GFR calc Af Amer >90  >90 mL/min   Comment: (NOTE)     The eGFR has been calculated using the CKD EPI equation.     This calculation has not been validated in all clinical situations.     eGFR's persistently <90 mL/min signify possible Chronic Kidney     Disease.      Vitals reviewed.  HENT:  Head: Normocephalic.  Eyes: EOM are normal.  Neck: Normal range of motion. Neck supple. No thyromegaly present.  Cardiovascular: Normal rate and regular rhythm.  Respiratory: Effort normal and breath sounds normal. No respiratory distress.  GI: Soft. Bowel sounds are normal. He  exhibits no distension.  Neurological:  Alert male in no acute distress. Speech hypophoic hyperkietic dysarthria He was able to provide his name and hospital  [employer] medical historian  Skin: Skin is warm and dry.  motor 5/5 in B UE and BLE  chorea noted LUE  Cerebellar intact  No cogwheeling    Assessment/Plan: 1. Functional deficits secondary to Parkinson's disease which require 3+ hours per day of interdisciplinary therapy in a comprehensive inpatient rehab setting. Physiatrist is providing close team supervision and 24 hour management of active medical problems listed below. Physiatrist and rehab team continue to assess barriers to discharge/monitor patient progress toward functional and medical goals. FIM: FIM - Bathing Bathing Steps Patient Completed: Chest;Right Arm;Left Arm;Abdomen;Right upper leg;Left upper leg;Front perineal area;Buttocks;Right lower leg (including foot);Left lower leg (including foot) Bathing: 4: Min-Patient completes 8-9 [id number] parts or 75+ percent  FIM - Upper Body Dressing/Undressing Upper body dressing/undressing steps patient completed: Thread/unthread right sleeve of pullover shirt/dresss;Thread/unthread left sleeve of pullover shirt/dress;Put head through opening of pull over shirt/dress;Pull shirt over trunk Upper body dressing/undressing: 5: Set-up assist to: Obtain clothing/put away FIM - Lower Body Dressing/Undressing Lower body dressing/undressing steps patient completed: Pull underwear up/down;Fasten/unfasten pants;Pull pants up/down Lower body dressing/undressing: 4: Min-Patient completed 75 plus % of tasks  FIM - Toileting Toileting steps completed by patient: Adjust clothing after toileting;Performs perineal hygiene;Adjust clothing prior to toileting Toileting Assistive Devices: Grab bar or rail for support Toileting: 4: Assist with fasteners  FIM - TRadio producerDevices: Grab bars Toilet  Transfers: 4-To toilet/BSC: Min A (steadying Pt. > 75%)  FIM - Bed/Chair Transfer Bed/Chair Transfer: 0: Activity did not occur  FIM - Locomotion: Wheelchair Distance: 20 Locomotion: Wheelchair: 0: Activity did not occur FIM - Locomotion: Ambulation Locomotion: Ambulation Assistive Devices: WAdministratorAmbulation/Gait Assistance: 3: Mod assist Locomotion: Ambulation: 1:  Travels less than 50 ft with moderate assistance (Pt: 50 - 74%)  Comprehension Comprehension Mode: Auditory Comprehension: 3-Understands basic 50 - 74% of the time/requires cueing 25 - 50%  of the time  Expression Expression Mode: Verbal Expression: 2-Expresses basic 25 - 49% of the time/requires cueing 50 - 75% of the time. Uses single words/gestures.  Social Interaction Social Interaction: 4-Interacts appropriately 75 - 89% of the time - Needs redirection for appropriate language or to initiate interaction.  Problem Solving Problem Solving: 2-Solves basic 25 - 49% of the time - needs direction more than half the time to initiate, plan or complete simple activities  Memory Memory: 2-Recognizes or recalls 25 - 49% of the time/requires cueing 51 - 75% of the time   Medical Problem List and Plan:  1. Functional deficits secondary to Parkinson's disease with decline in functional status after fever of unknown etiology. Continue Sinemet as directed for Parkinson disease  2. DVT Prophylaxis/Anticoagulation: Lovenox. Monitor platelet counts and any signs of bleeding  3. Pain Management: Tylenol as needed  4. Mood/dementia: Aricept 10 mg daily, Lexapro 10 mg daily, Seroquel 25 mg each bedtime. Bed alarm for safety discuss cognitive baseline with family  5. Neuropsych: This patient is not capable of making decisions on his own behalf.  6. Dysphagia. Dysphagia 2 thin liquid diet  7. Recent tooth extraction. Continue doxycycline for now  8. Overactive bladder..Myrbetriq 50 mg daily, Flomax 0.4 mg daily. Check PVRs x3   9. Hyperlipidemia. Zetia   LOS (Days) 3 A FACE TO FACE EVALUATION WAS PERFORMED  Chelcey Caputo E 05/12/2014, 10:42 AM

## 2014-05-12 NOTE — Progress Notes (Signed)
Physical Therapy Session Note  Patient Details  Name: [name] MRN: [id number] Date of Birth: [birth year]  Today's Date: 05/12/2014 Time: 6659-9357 Time Calculation (min): 47 min  Short Term Goals: Week 1:  PT Short Term Goal 1 (Week 1): Pt will ambulate 54' w/ LRAD w/ MinA PT Short Term Goal 2 (Week 1): Pt will transfers bed to/from w/c w/ LRAD w/ MinA PT Short Term Goal 3 (Week 1): Pt will propel w/c 50' in controlled environment w/ MinA and min cues PT Short Term Goal 4 (Week 1): Pt will perform all bed mobility with MinA  Skilled Therapeutic Interventions/Progress Updates:  Pt. Received in w/c with wife present in room. Pt states he wants to walk with rolator for this afternoon's session.  Gait: 65 feet x 2 with min - mod A for safety and stability with rolator. Pt. Required frequent to constant verbal cues for step width, gait velocity, and upright posture. Pt. Displays difficulty with turning to stand>sit and frequently obtains a scissoring posture/stance with task.  Self-Care: Pt. required additional assistance to toilet due to fatigue and partial BM during ambulation. Pt. Required help doffing shorts and briefs; donning shorts and clean brief mod A for stability and task completion. Pt. Required additional supplies for toileting and additional pericare. Therapist is aware of 3 BMs having occurred today; Pt. Wife states decreased frequency of stool softeners needed.  Therapeutic Activities: sit<>Stand x 8 during toileting and dressing min-mod A for safety and task completion. Sit>supine mod A due to patient Fatigue. Bed mobility for postioning; including scooting up to [facility], log rolling to position torso, with hop and shoulder lateral movements for postural alignment. Pt. Fatigued and only able to perform movements with short rest/recovery breaks.  Pt. Has no c/o pain during session or post tx.  Pt. Supine in bed with all needs within reach with wife present.  Therapy  Documentation Precautions:  Precautions Precautions: Fall Restrictions Weight Bearing Restrictions: No  Locomotion : Ambulation Ambulation/Gait Assistance: 3: Mod assist   See FIM for current functional status  Therapy/Group: Individual Therapy  [name] 05/12/2014, 4:20 PM

## 2014-05-13 ENCOUNTER — Inpatient Hospital Stay (HOSPITAL_COMMUNITY): Payer: Medicare Other | Admitting: Physical Therapy

## 2014-05-13 ENCOUNTER — Inpatient Hospital Stay (HOSPITAL_COMMUNITY): Payer: Medicare Other | Admitting: Occupational Therapy

## 2014-05-13 ENCOUNTER — Inpatient Hospital Stay (HOSPITAL_COMMUNITY): Payer: Medicare Other

## 2014-05-13 DIAGNOSIS — I4891 Unspecified atrial fibrillation: Secondary | ICD-10-CM

## 2014-05-13 DIAGNOSIS — G20 Parkinson's disease: Secondary | ICD-10-CM

## 2014-05-13 DIAGNOSIS — F0390 Unspecified dementia without behavioral disturbance: Secondary | ICD-10-CM

## 2014-05-13 NOTE — Progress Notes (Signed)
Occupational Therapy Session Note  Patient Details  Name: [name] MRN: [id number] Date of Birth: [birth year]  Today's Date: 05/13/2014 Time: 0800-0855 Time Calculation (min): 55 min  Short Term Goals: Week 1:  OT Short Term Goal 1 (Week 1): Patient will perform UB/LB bathing with minimal assistance OT Short Term Goal 2 (Week 1): Patient will don shirt with min assist OT Short Term Goal 3 (Week 1): Patient will perform LB dressing with moderate assistance OT Short Term Goal 4 (Week 1): Patient will be educated on a BUE functional strengthening HEP  Skilled Therapeutic Interventions/Progress Updates:  Patient received supine in bed with no complaints of pain. Patient engaged in bed mobility, sat EOB, and stood with RW, Patient with posterior lean during standing and fell back onto bed. Therapist provided verbal and tactile cueing to have patient lean forward in order to prevent posterior lean. Patient stood second time with success and ambulated into bathroom using rollator with minimal assistance and moderate multimodal cues for safe & effective functional ambulation to transfer onto tub transfer bench in shower. UB/LB bathing completed in sit<>stand position, patient dried, then transferred bench>w/c with minimal assistance. Patient sat at sink for grooming tasks and completed UB/LB dressing in sit<>stand position at sink. Therapist donned bilateral TEDs per order. Patient required moderate multimodal cues throughout session for problem solving, memory, initiation, and attention. Patient does well with direct simple cues in order to complete tasks. At end of session, left patient seated in w/c with NT present for breakfast.  Precautions:  Precautions Precautions: Fall Restrictions Weight Bearing Restrictions: No  See FIM for current functional status  Therapy/Group: Individual Therapy  Geryl Dohn 05/13/2014, 9:04 AM

## 2014-05-13 NOTE — Progress Notes (Signed)
Patient ID: [name], male   DOB: May 19, 1941, 73 y.o.   MRN: [id number]  05/13/14.  73 y.o. right-handed male with history significant for Parkinson's disease status post deep brain stimulator by Dr. Angelyn Punt neurosurgery at [facility], CAD/pacemaker, dementia. Patient lives with his wife used a rollator prior to admission and goes to the gym to participate in a Parkinson's program. Admitted 05/05/2014 with chills, fever and generalized weakness with increasing confusion. Reports of recent tooth extraction 2 days prior to admission. Chest x-ray negative. Cranial CT scan with no acute intracranial abnormalities with bilateral deep brain stimulators via high bifrontal Burr holes  Patient Vitals for the past 24 hrs:  BP Temp Temp src Pulse Resp SpO2  05/13/14 0543 129/78 mmHg 97.1 F (36.2 C) Oral 63 17 90 %  05/12/14 2144 126/83 mmHg 97 F (36.1 C) Oral 67 17 94 %  05/12/14 1500 124/80 mmHg 97.2 F (36.2 C) Oral 60 18 92 %     Intake/Output Summary (Last 24 hours) at 05/13/14 0841 Last data filed at 05/12/14 1600  Gross per 24 hour  Intake    600 ml  Output    100 ml  Net    500 ml     Subjective/Complaints: Pt in bed no c/o today Hypophonic Working with therapy at bedside Review of Systems - Unable to obtain secondary to medical status  Objective: Vital Signs: Blood pressure 129/78, pulse 63, temperature 97.1 F (36.2 C), temperature source Oral, resp. rate 17, height 5\' 11"  (1.803 m), weight 166 lb 0.1 oz (75.3 kg), SpO2 90.00%. No results found. No results found for this or any previous visit (from the past 72 hour(s)).    Vitals reviewed.  HENT:  Head: Normocephalic.  Eyes: EOM are normal.  Neck: Normal range of motion. Neck supple. No thyromegaly present.  Cardiovascular: Normal rate and regular rhythm.  Respiratory: Effort normal and breath sounds normal. No respiratory distress.  GI: Soft. Bowel sounds are normal. He exhibits no distension.  Neurological:   Alert male in no acute distress. Speech hypophoic hyperkietic dysarthria Skin: Skin is warm and dry.  motor 5/5 in B UE and BLE  chorea noted LUE  Cerebellar intact  No cogwheeling    Assessment/Plan:  1. Functional deficits secondary to Parkinson's disease with decline in functional status after fever of unknown etiology. Continue Sinemet as directed for Parkinson disease  2. DVT Prophylaxis/Anticoagulation: Lovenox. Monitor platelet counts and any signs of bleeding  3. Pain Management: Tylenol as needed  4. Mood/dementia: Aricept 10 mg daily, Lexapro 10 mg daily, Seroquel 25 mg each bedtime. Bed alarm for safety discuss cognitive baseline with family  5. Neuropsych: This patient is not capable of making decisions on his own behalf.  6. Dysphagia. Dysphagia 2 thin liquid diet  7. Recent tooth extraction. Continue doxycycline for now  8. Overactive bladder..Myrbetriq 50 mg daily, Flomax 0.4 mg daily. Check PVRs x3  9. Hyperlipidemia. Zetia   LOS (Days) 4 A FACE TO FACE EVALUATION WAS PERFORMED  Rogelia Boga 05/13/2014, 8:39 AM

## 2014-05-13 NOTE — Progress Notes (Addendum)
Physical Therapy Session Note  Patient Details  Name: [name] MRN: [id number] Date of Birth: 1941-09-25  Today's Date: 05/13/2014 Time: 2706-2376 Time Calculation (min): 49 min  Short Term Goals: Week 1:  PT Short Term Goal 1 (Week 1): Pt will ambulate 25' w/ LRAD w/ MinA PT Short Term Goal 2 (Week 1): Pt will transfers bed to/from w/c w/ LRAD w/ MinA PT Short Term Goal 3 (Week 1): Pt will propel w/c 50' in controlled environment w/ MinA and min cues PT Short Term Goal 4 (Week 1): Pt will perform all bed mobility with MinA  Skilled Therapeutic Interventions/Progress Updates:    Pt with decreased performance when compared to am session. Pt with limited postural control requiring max cues to correct, limited command following, and eyes closed at points. As per pt's wife, pt typically sleeps after all meals prior to admission. Vitals WFL able to tolerated decreased intensity of program in session.  Therapy Documentation Precautions:  Precautions Precautions: Fall Restrictions Weight Bearing Restrictions: No Mobility:  all transfers mod A with cues for attention, safety, and posture. Locomotion : Ambulation Ambulation/Gait Assistance: 2: Max assist with cues for weight shift, technique , safety and attention Wheelchair Mobility Distance: 75  Other Treatments:  Pt performs static and dynamic standing balance tasks with dual cog and motor tasks including weight shifting, LE manipulation, reaching, and grasping. Pt performs repeated weight shifting 2x10 in wheelchair. Facilitation of upright posture performed. Pt and wife educated on rehab plan and anticipated disposition. Hip add, HS, and gastroc stretches performed 2x30".  See FIM for current functional status  Therapy/Group: Individual Therapy  Christia Reading 05/13/2014, 3:55 PM

## 2014-05-13 NOTE — Progress Notes (Signed)
Physical Therapy Session Note  Patient Details  Name: [name] MRN: [id number] Date of Birth: Jul 06, 1941  Today's Date: 05/13/2014 Time: 0932-3557 Time Calculation (min): 59 min  Short Term Goals: Week 1:  PT Short Term Goal 1 (Week 1): Pt will ambulate 48' w/ LRAD w/ MinA PT Short Term Goal 2 (Week 1): Pt will transfers bed to/from w/c w/ LRAD w/ MinA PT Short Term Goal 3 (Week 1): Pt will propel w/c 50' in controlled environment w/ MinA and min cues PT Short Term Goal 4 (Week 1): Pt will perform all bed mobility with MinA Week 2:    Week 3:     Skilled Therapeutic Interventions/Progress Updates:    Pt demonstrates improved transfers with cues for large amplitude movement. HEP reviewed with pt and wife reviewed for exaggerated phonation with loud voice to promote core strength while maintaining patient safety at this time. Pt would benefit from further high amplitude training. Pt would continue to benefit from skilled PT services to increase functional mobility.  Therapy Documentation Precautions:  Precautions Precautions: Fall Restrictions Weight Bearing Restrictions: No Pain: Pain Assessment Pain Assessment: No/denies pain Mobility:  SBA for bed mobility and Mod A with transfer with cues for weight shift and amplitude. Locomotion : Gait with RW, HHA, then no AD mod A with cues for amplitude and posture 100'x5. Stairs Mod A with cues for hand placement, amplitude, and sequencing. Wheelchair Mobility Distance: 75  Trunk/Postural Assessment :    Balance:   Exercises:   Other Treatments:  Pt performs static and dynamic standing balance tasks with dual motor task including weight shifting, reaching, and grasping. Pt performs repeated sit to stands 5x5 with cues for weight shift and amplitude. Exaggerated phonation performed for core strengthening. Pt and wife educated on high amplitude training. Wife and pt educated on using phonation as component of HEP.  See FIM for  current functional status  Therapy/Group: Individual Therapy  Christia Reading 05/13/2014, 12:28 PM

## 2014-05-13 NOTE — Progress Notes (Signed)
Speech Language Pathology Daily Session Note  Patient Details  Name: [name] MRN: [id number] Date of Birth: [birth year]  Today's Date: 05/13/2014 Time: 1430-1500 Time Calculation (min): 30 min  Short Term Goals: Week 1: SLP Short Term Goal 1 (Week 1): Pt will improve coordination of breath support with articulation for 80% speech intelligibilty in conversations with mod assist  SLP Short Term Goal 2 (Week 1): Pt will tolerate his currently prescribed diet with no overt s/s of aspiraiton and min cuing for use of swallowing precautions  SLP Short Term Goal 3 (Week 1): Pt will use external aids to facilitate improved orientaiton to place, date, and situation as well as improved recall of daily events and information for 75% accuracy with mod assist.  SLP Short Term Goal 4 (Week 1): Pt will identify 2 environmental modifications that he can use to faciltate improved speech intelligibility during conversations with skilled and unskilled communication partners.   SLP Short Term Goal 5 (Week 1): Pt will sustain attention to basic, familiar tasks for 15-20 minutes with min cuing for redirection.   SLP Short Term Goal 6 (Week 1): Pt will tolerate trials of upgraded textures with no overt s/s of aspiration.   Skilled Therapeutic Interventions: Skilled treatment focused on cognitive and speech goals. SLP facilitated session with Mod-Max cues for basic problem solving to utilize daily schedule. In a quiet environment, pt required Min cues for diaphragmatic breathing to increase speech intelligibility. Pt and wife with questions regarding current diet and MBS on Monday; SLP provided education regarding the rationale for precautions and repeat testing (they report pt had test completed at [facility] in [place]). Continue plan of care.   FIM:  Comprehension Comprehension Mode: Auditory Comprehension: 4-Understands basic 75 - 89% of the time/requires cueing 10 - 24% of the  time Expression Expression Mode: Verbal Expression: 4-Expresses basic 75 - 89% of the time/requires cueing 10 - 24% of the time. Needs helper to occlude trach/needs to repeat words. Social Interaction Social Interaction: 4-Interacts appropriately 75 - 89% of the time - Needs redirection for appropriate language or to initiate interaction. Problem Solving Problem Solving: 2-Solves basic 25 - 49% of the time - needs direction more than half the time to initiate, plan or complete simple activities Memory Memory: 3-Recognizes or recalls 50 - 74% of the time/requires cueing 25 - 49% of the time FIM - Eating Eating Activity: 5: Supervision/cues  Pain Pain Assessment Pain Assessment: No/denies pain  Therapy/Group: Individual Therapy   Maxcine Ham, M.A. CCC-SLP [phone]  Maxcine Ham 05/13/2014, 4:07 PM

## 2014-05-14 ENCOUNTER — Inpatient Hospital Stay (HOSPITAL_COMMUNITY): Payer: Medicare Other | Admitting: Physical Therapy

## 2014-05-14 NOTE — Progress Notes (Signed)
Patient ID: [name], male   DOB: 19-Feb-1941, 73 y.o.   MRN: [id number]   Patient ID: [name], male   DOB: 19-Feb-1941, 73 y.o.   MRN: [id number]  05/14/14.  73 y.o. right-handed male with history significant for Parkinson's disease status post deep brain stimulator [facility], CAD/pacemaker, dementia. Patient lives with his wife used a rollator prior to admission and goes to the gym to participate in a Parkinson's program.  Admitted 05/05/2014 with chills, fever and generalized weakness with increasing confusion. Reports of recent tooth extraction 2 days prior to admission. Chest x-ray negative. Cranial CT scan with no acute intracranial abnormalities with bilateral deep brain stimulators via high bifrontal Ezekiel InaBurr holes  Past Medical History  Diagnosis Date  . Parkinson disease   . Bradycardia     syncope due to prolonged pauses, resolved with PPM  . Sleep apnea   . Hypertension   . Dermatitis   . Depression   . Rib fractures 2011    S/p fall  . Maxillary fracture 2009    Right  . BPH (benign prostatic hypertrophy)   . Coronary artery disease 1986    Inferior wall MI treated with Angioplasty  . Normal echocardiogram 2007    Mild LVH with Impaired relaxation, Mild-Mod. Aortic Root dilation, Mild Aortic Sclerosis, Mild MR, Mild TR, Ef-50-55%  . Normal nuclear stress test 2006    Normal EF-54%  . CAD (coronary artery disease)   . Inferior MI   . 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 Maxillary fracture 2009    Right side  . Rib fracture 07/2010  . Atrioventricular block, complete   . Pacemaker   . H/O hiatal hernia      Patient Vitals for the past 24 hrs:  BP Temp Temp src Pulse Resp SpO2  05/14/14 0548 145/90 mmHg 97.6 F (36.4 C) Oral 80 17 90 %  05/13/14 2045 123/74 mmHg 97.5 F (36.4 C) Oral 62 18 95 %  05/13/14 1500 104/67 mmHg 97.4 F (36.3 C) Oral 72 20 94 %     Intake/Output Summary (Last 24 hours) at 05/14/14  [id number] Last data filed at 05/14/14 0754  Gross per 24 hour  Intake    600 ml  Output    475 ml  Net    125 ml     Subjective/Complaints: Pt in bed no c/o today Hypophonic Working with therapy at bedside Review of Systems - Unable to obtain secondary to medical status  Objective: Vital Signs: Blood pressure 145/90, pulse 80, temperature 97.6 F (36.4 C), temperature source Oral, resp. rate 17, height 5\' 11"  (1.803 m), weight 166 lb 0.1 oz (75.3 kg), SpO2 90.00%. No results found. No results found for this or any previous visit (from the past 72 hour(s)).    Vitals reviewed.  HENT:  Head: Normocephalic.  Eyes: EOM are normal.  Neck: Normal range of motion. Neck supple. No thyromegaly present.  Cardiovascular: Normal rate and regular rhythm.  Respiratory: Effort normal and breath sounds normal. No respiratory distress.  GI: Soft. Bowel sounds are normal. He exhibits no distension.  Neurological:  Alert male in no acute distress. Speech hypophoic hyperkietic dysarthria Skin: Skin is warm and dry.  motor 5/5 in B UE and BLE  chorea noted LUE  Cerebellar intact  No cogwheeling  Tremor present    Assessment/Plan:  1. Functional deficits secondary to Parkinson's disease with decline in functional status after fever  of unknown etiology. Continue Sinemet as directed for Parkinson disease  2. DVT Prophylaxis/Anticoagulation: Lovenox. Monitor platelet counts and any signs of bleeding  3. Pain Management: Tylenol as needed  4. Mood/dementia: Aricept 10 mg daily, Lexapro 10 mg daily, Seroquel 25 mg each bedtime. Bed alarm for safety discuss cognitive baseline with family  5. Neuropsych: This patient is not capable of making decisions on his own behalf.  6. Dysphagia. Dysphagia 2 thin liquid diet  7. Recent tooth extraction. Continue doxycycline for now  8. Overactive bladder..Myrbetriq 50 mg daily, Flomax 0.4 mg daily. Check PVRs x3  9. Hyperlipidemia. Zetia   LOS (Days) 5 A FACE  TO FACE EVALUATION WAS PERFORMED  Rogelia Boga 05/14/2014, 8:12 AM

## 2014-05-14 NOTE — Progress Notes (Signed)
Physical Therapy Session Note  Patient Details  Name: [name] MRN: [id number] Date of Birth: August 03, 1941  Today's Date: 05/14/2014 Time: 0272-5366 Time Calculation (min): 45 min  Short Term Goals: Week 1:  PT Short Term Goal 1 (Week 1): Pt will ambulate 65' w/ LRAD w/ MinA PT Short Term Goal 2 (Week 1): Pt will transfers bed to/from w/c w/ LRAD w/ MinA PT Short Term Goal 3 (Week 1): Pt will propel w/c 50' in controlled environment w/ MinA and min cues PT Short Term Goal 4 (Week 1): Pt will perform all bed mobility with MinA  Skilled Therapeutic Interventions/Progress Updates:    Pt demonstrates improvement requiring decreased assist for gait activities at this time. Pt still limited in transfers with decreased anterior weight shift. Weight shift improves with cueing but pt with difficult time carrying over cues. Pt with improved ability to follow commands and attend compared to pm session yesterday.   Therapy Documentation Precautions:  Precautions Precautions: Fall Restrictions Weight Bearing Restrictions: No Pain:  None reported Mobility:  Pt Mod A for transfers with cues for safety, attention, and weight shift. Locomotion : Ambulation Ambulation/Gait Assistance: 4: Min assist 100'x2 with rollator with cues for pacing, attention, and sequencing  Other Treatments:   Pt performs static and dynamic standing and sitting balance tasks with dual cog and dual motor task including weight shifting, reaching, LE manipulation, and grasping. Pt requires redirection throughout session to stay on task. Pt performs repeated sit to stands 3x5. Pt performs static standing 1"x2.   See FIM for current functional status  Therapy/Group: Individual Therapy  Christia Reading 05/14/2014, 12:40 PM

## 2014-05-15 ENCOUNTER — Inpatient Hospital Stay (HOSPITAL_COMMUNITY): Payer: Medicare Other

## 2014-05-15 ENCOUNTER — Inpatient Hospital Stay (HOSPITAL_COMMUNITY): Payer: Medicare Other | Admitting: Occupational Therapy

## 2014-05-15 ENCOUNTER — Inpatient Hospital Stay (HOSPITAL_COMMUNITY): Payer: Medicare Other | Admitting: Speech Pathology

## 2014-05-15 DIAGNOSIS — G20 Parkinson's disease: Secondary | ICD-10-CM

## 2014-05-15 DIAGNOSIS — I4891 Unspecified atrial fibrillation: Secondary | ICD-10-CM

## 2014-05-15 DIAGNOSIS — F0390 Unspecified dementia without behavioral disturbance: Secondary | ICD-10-CM

## 2014-05-15 NOTE — Progress Notes (Signed)
Physical Therapy Session Note  Patient Details  Name: [name] MRN: [id number] Date of Birth: 1941/05/05  Today's Date: 05/15/2014 Time: 1130-1203 & 8832-5498 Time Calculation (min): 33 min & 57 min  Short Term Goals: Week 1:  PT Short Term Goal 1 (Week 1): Pt will ambulate 50' w/ LRAD w/ MinA PT Short Term Goal 2 (Week 1): Pt will transfers bed to/from w/c w/ LRAD w/ MinA PT Short Term Goal 3 (Week 1): Pt will propel w/c 50' in controlled environment w/ MinA and min cues PT Short Term Goal 4 (Week 1): Pt will perform all bed mobility with MinA  Skilled Therapeutic Interventions/Progress Updates:  Session 1 (33 min): Pt. Supine in bed, HOB elevated; visibly wet shorts when covers removed. Pt. Reports he had difficulty with urinal. Donned patient's shoes for safety upon sitting. Therapeutic Activities: supine<>sit for pericare at (s) for safety. Pt. Sit<>stand x 4 for donn/doff of shorts and brief at min A for safety and steady. Pt. Performed single leg stance with steady by therapist x 4 with functional task appropriate trunk flexion, sequencing of BLE, and weight shifting. Pt. Requires seated rest/recovery breaks with increased activity.  Pt. Transferred stand>sit to w/c at (s) with pink gait belt applied post sitting. Pt. States he is ready for lunch. RN tech notified. Pt. Has no c/o pain.  Session 2 (57 min): Pt. Seated in w/c with gait belt secured. Wife present and has additional shorts.  Therapeutic Activities: sit<>stand (s) to donn shorts with rolator and steady from therapist. Stand<>toilet x 3 for pericare; patient requires mod-max A for pericare completion (donn/doff, additional equipment/supplies) Gait: Pt. Ambulated 80', 100', 180', 310' with rolator at (s) to min A for occasional LOB and safety. Pt. Requires frequent verbal cues for step width, step length, and object navigation. Pt. Displays increasing scissoring pattern with complex turning; unable maintaining proper  sequencing of BLE.resulting in unsafe movement patterns, must give strong tactile and verbal cues. Self-Care: Pt. Required toileting and related pericare with BM that occurred with ambulation/gait. Therapist required to assist pt. Transfers, pericare, and dressing. Pt. Supine with HOB elevated. All needs within reach and coached to contact RN with additional needs. Wife present and able to verbalize additional need to limit stool softeners. Pt. Has no c/o pain post tx.   Therapy Documentation Precautions:  Precautions Precautions: Fall Restrictions Weight Bearing Restrictions: No  Locomotion : Ambulation Ambulation/Gait Assistance: 4: Min assist   See FIM for current functional status  Therapy/Group: Individual Therapy  [name] 05/15/2014, 4:38 PM

## 2014-05-15 NOTE — Progress Notes (Signed)
73 y.o. right-handed male with history significant for Parkinson's disease status post deep brain stimulator by Dr. Angelyn Punt neurosurgery at [facility], CAD/pacemaker, dementia. Patient lives with his wife used a rollator prior to admission and goes to the gym to participate in a Parkinson's program. Admitted 05/05/2014 with chills, fever and generalized weakness with increasing confusion. Reports of recent tooth extraction 2 days prior to admission. Chest x-ray negative. Cranial CT scan with no acute intracranial abnormalities with bilateral deep brain stimulators via high bifrontal Burr holes  Subjective/Complaints: Denies pain. Appetite fair to good. Denies choking or cough Review of Systems - somewhat limited due to speech/cognitive  Objective: Vital Signs: Blood pressure 116/77, pulse 69, temperature 97.8 F (36.6 C), temperature source Oral, resp. rate 18, height 5\' 11"  (1.803 m), weight 75.3 kg (166 lb 0.1 oz), SpO2 91.00%. No results found. No results found for this or any previous visit (from the past 72 hour(s)).    Vitals reviewed.  HENT:  Head: Normocephalic.  Eyes: EOM are normal.  Neck: Normal range of motion. Neck supple. No thyromegaly present.  Cardiovascular: Normal rate and regular rhythm.  Respiratory: Effort normal and breath sounds normal. No respiratory distress.  GI: Soft. Bowel sounds are normal. He exhibits no distension.  Neurological:  Alert male in no acute distress. 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  motor 5/5 in B UE and BLE  chorea noted LUE  Cerebellar intact  No cogwheeling. Persistent tremors bilateral UE's    Assessment/Plan: 1. Functional deficits secondary to Parkinson's disease which require 3+ hours per day of interdisciplinary therapy in a comprehensive inpatient rehab setting. Physiatrist is providing close team supervision and 24  hour management of active medical problems listed below. Physiatrist and rehab team continue to assess barriers to discharge/monitor patient progress toward functional and medical goals. FIM: FIM - Bathing Bathing Steps Patient Completed: Chest;Right Arm;Left Arm;Abdomen;Right upper leg;Left upper leg;Front perineal area;Buttocks;Right lower leg (including foot);Left lower leg (including foot) Bathing: 4: Steadying assist  FIM - Upper Body Dressing/Undressing Upper body dressing/undressing steps patient completed: Thread/unthread right sleeve of pullover shirt/dresss;Thread/unthread left sleeve of pullover shirt/dress;Put head through opening of pull over shirt/dress;Pull shirt over trunk Upper body dressing/undressing: 5: Set-up assist to: Obtain clothing/put away FIM - Lower Body Dressing/Undressing Lower body dressing/undressing steps patient completed: Thread/unthread right underwear leg;Thread/unthread left underwear leg;Thread/unthread right pants leg;Thread/unthread left pants leg;Fasten/unfasten right shoe;Don/Doff left shoe;Don/Doff right shoe;Fasten/unfasten left shoe;Fasten/unfasten pants Lower body dressing/undressing: 4: Min-Patient completed 75 plus % of tasks  FIM - Toileting Toileting steps completed by patient: Adjust clothing prior to toileting;Performs perineal hygiene Toileting Assistive Devices: Grab bar or rail for support Toileting: 0: Activity did not occur  FIM - Diplomatic Services operational officer Devices: Therapist, music Transfers: 0-Activity did not occur  FIM - Banker Devices: Therapist, occupational: 4: Bed > Chair or W/C: Min A (steadying Pt. > 75%);4: Chair or W/C > Bed: Min A (steadying Pt. > 75%)  FIM - Locomotion: Wheelchair Distance: 75 Locomotion: Wheelchair: 0: Activity did not occur FIM - Locomotion: Ambulation Locomotion: Ambulation Assistive Devices: Designer, industrial/product (rollator) Ambulation/Gait  Assistance: 4: Min assist Locomotion: Ambulation: 2: Travels 50 - 149 ft with minimal assistance (Pt.>75%)  Comprehension Comprehension Mode: Auditory Comprehension: 4-Understands basic 75 - 89% of the time/requires cueing 10 - 24% of the time  Expression Expression Mode: Verbal Expression:  4-Expresses basic 75 - 89% of the time/requires cueing 10 - 24% of the time. Needs helper to occlude trach/needs to repeat words.  Social Interaction Social Interaction: 4-Interacts appropriately 75 - 89% of the time - Needs redirection for appropriate language or to initiate interaction.  Problem Solving Problem Solving: 2-Solves basic 25 - 49% of the time - needs direction more than half the time to initiate, plan or complete simple activities  Memory Memory: 3-Recognizes or recalls 50 - 74% of the time/requires cueing 25 - 49% of the time   Medical Problem List and Plan:  1. Functional deficits secondary to Parkinson's disease with decline in functional status after fever of unknown etiology. Continue Sinemet as directed for Parkinson disease  2. DVT Prophylaxis/Anticoagulation: Lovenox.  3. Pain Management: Tylenol as needed  4. Mood/dementia: Aricept 10 mg daily, Lexapro 10 mg daily, Seroquel 25 mg each bedtime. Bed alarm for safety discuss cognitive baseline with family  5. Neuropsych: This patient is not capable of making decisions on his own behalf.  6. Dysphagia. Dysphagia 2 thin liquid diet   -repeat MBS today 7. Recent tooth extraction. Continue doxycycline for now  8. Overactive bladder..Myrbetriq 50 mg daily, Flomax 0.4 mg daily.  9. Hyperlipidemia. Zetia   LOS (Days) 6 A FACE TO FACE EVALUATION WAS PERFORMED  Darris Staiger T 05/15/2014, 9:03 AM

## 2014-05-15 NOTE — Progress Notes (Signed)
Occupational Therapy Session Note  Patient Details  Name: [name] MRN: [id number] Date of Birth: 1941-09-07  Today's Date: 05/15/2014 Time: 0800-0900 Time Calculation (min): 60 min  Short Term Goals: Week 1:  OT Short Term Goal 1 (Week 1): Patient will perform UB/LB bathing with minimal assistance OT Short Term Goal 2 (Week 1): Patient will don shirt with min assist OT Short Term Goal 3 (Week 1): Patient will perform LB dressing with moderate assistance OT Short Term Goal 4 (Week 1): Patient will be educated on a BUE functional strengthening HEP  Skilled Therapeutic Interventions/Progress Updates:  Patient finishing breakfast with HOB up and NT supervising.  Engaged in self care retraining to include shave, toilet, shower, dress and other grooming tasks.  Focused session on activity tolerance, speech intelligibility, problem solving, sequencing, dynamic balance, sit><stand, and standing tolerance.  Once patient seated EOB he was provided with choices, on what to do first.  He stated that he wanted to shave at sink.  Patient was presented with his slippers so he could walk then he picked them up and put them aside.  When asked if he plans to walk in his bare feet, he reached for and donned his slippers.  Patient ambulated to sink with Rollator and stood to shave with electric razor.  Patient not quite finished shaving then said "poop" and within 2 seconds he began to have soft BM while standing.  After this clinician cleaned the BM off the floor, patient ambulated to toilet and required assist to complete hygiene. Patient struggled with problem solving, sequencing and motor planning throughout session and several times attempted sit>stand 4-5 times before successful secondary to not able to perform and maintain anterior weight shifts onto feet.  Therapy Documentation Precautions:  Precautions Precautions: Fall Restrictions Weight Bearing Restrictions: No Pain: Denies pain ADL: See FIM  for current functional status  Therapy/Group: Individual Therapy  Mihira Tozzi 05/15/2014, 9:15 AM

## 2014-05-15 NOTE — Procedures (Signed)
Objective Swallowing Evaluation: Modified Barium Swallowing Study  Patient Details  Name: [name] MRN: [id number] Date of Birth: 1941-11-08  Today's Date: 05/15/2014 Time: 0935-1000 Time Calculation (min): 25 min  Past Medical History:  Past Medical History  Diagnosis Date  . Parkinson disease   . Bradycardia     syncope due to prolonged pauses, resolved with PPM  . Sleep apnea   . Hypertension   . Dermatitis   . Depression   . Rib fractures 2011    S/p fall  . Maxillary fracture 2009    Right  . BPH (benign prostatic hypertrophy)   . Coronary artery disease 1986    Inferior wall MI treated with Angioplasty  . Normal echocardiogram 2007    Mild LVH with Impaired relaxation, Mild-Mod. Aortic Root dilation, Mild Aortic Sclerosis, Mild MR, Mild TR, Ef-50-55%  . Normal nuclear stress test 2006    Normal EF-54%  . CAD (coronary artery disease)   . Inferior MI   . 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 Maxillary fracture 2009    Right side  . Rib fracture 07/2010  . Atrioventricular block, complete   . Pacemaker   . H/O hiatal hernia    Past Surgical History:  Past Surgical History  Procedure Laterality Date  . Pacemaker insertion  10/28/10    MDT implanted by Dr Ladona Ridgelaylor  . Deep brain stimulator placement      Parkinsons  . Arthroscopic knee      Left knee  . Cataract surgery      Bilateral  . Inguinal hernia repair      Bilateral  . Cardiac catheterization      Multiple Angioplasties, last 2002  . Removal of ganglion cyst      right wrist  . Insertion / placement / revision neurostimulator    . Eye surgery    . Insert / replace / remove pacemaker     HPI:  [name] BogusRobert J [name] is a 73 year old male with past medical history significant for Parkinson's disease status post deep brain stimulator, [facility], CAD, dementia admitted with fevers and MD documentation states wife reports coughing with meals. Underwent tooth extraction 2 days  ago.  CT 6/26 revealed mild cardiomegaly, no acute pulmonary process.  C[facility]'s performed at [facility] since 2011 have been negative.  Sus[facility] swallow [facility] due to sign in room stating pt. is not to use straws and pt. stated "swallow test done 2 months ago was ok." (? reliability with his dementia).  Patient currently maintained on a dysphagia 2 thin liquid diet. Patient was admitted for comprehensive rehabilitation program 05/09/14 and has been participating in therapies.  Objective study warranted to determine safest diet consistency in the setting of progressive illness and inconsistent s/s of aspiration noted at bedside.     Recommendation/Prognosis  Clinical Impression:   Dysphagia Diagnosis: Mild oral phase dysphagia;Mild pharyngeal phase dysphagia Patient demonstrates a mild oral and pharyngeal phase sensory-motor dysphagia. Oral phase deficits are characterized by prolonged mastication, organization and transit of boluses.  Pharyngeal phase deficits are characterized by a delayed swallow initiation (to the level of the pyriform sinuses with thin liquids) with decreased base of tongue retraction, laryngeal elevation and epiglottic inversion resulting in silent penetration of residuals post swallow with large boluses if thin liquid sips.  Patient with residue in the vallecula, lateral channels and pyriform sinuses post swallows that he required consistent cues to  manage with a second swallow. While, no aspiration was observed during this study patient's cognition (needs for consistent cues), distractibility and fatigue are also factors to consider during a full meal with PO intake.  Recommend to continue with current orders and strict full supervision during PO intake.     Swallow Evaluation Recommendations:  Diet Recommendations: Dysphagia 2 (Fine chop);Thin liquid Liquid Administration via: Cup Medication Administration: Whole meds with liquid Supervision: Patient able to self  feed;Full supervision/cueing for compensatory strategies Compensations: Slow rate;Small sips/bites;Multiple dry swallows after each bite/sip Postural Changes and/or Swallow Maneuvers: Seated upright 90 degrees;Upright 30-60 min after meal;Out of bed for meals Oral Care Recommendations: Oral care BID Follow up Recommendations: Outpatient SLP;24 hour supervision/assistance    Prognosis:  Prognosis for Safe Diet Advancement: Good Barriers to Reach Goals: Cognitive deficits   Individuals Consulted: Consulted and Agree with Results and Recommendations: Patient      SLP Assessment/Plan  Plan:  See Individualized Plan of care for details   Short Term Goals: Week 1: SLP Short Term Goal 1 (Week 1): Pt will improve coordination of breath support with articulation for 80% speech intelligibilty in conversations with mod assist  SLP Short Term Goal 2 (Week 1): Pt will tolerate his currently prescribed diet with no overt s/s of aspiraiton and min cuing for use of swallowing precautions  SLP Short Term Goal 3 (Week 1): Pt will use external aids to facilitate improved orientaiton to place, date, and situation as well as improved recall of daily events and information for 75% accuracy with mod assist.  SLP Short Term Goal 4 (Week 1): Pt will identify 2 environmental modifications that he can use to faciltate improved speech intelligibility during conversations with skilled and unskilled communication partners.   SLP Short Term Goal 5 (Week 1): Pt will sustain attention to basic, familiar tasks for 15-20 minutes with min cuing for redirection.   SLP Short Term Goal 6 (Week 1): Pt will tolerate trials of upgraded textures with no overt s/s of aspiration.     General: Date of Onset: 05/06/14 Type of Study: Modified Barium Swallowing Study Reason for Referral: Objectively evaluate swallowing function Previous Swallow Assessment: BSE, recommended dys 2 textures with thin liquids Diet Prior to this Study:  Dysphagia 2 (chopped);Thin liquids Temperature Spikes Noted: No Respiratory Status: Room air History of Recent Intubation: No Behavior/Cognition: Alert;Cooperative;Pleasant mood;Requires cueing;Distractible Oral Cavity - Dentition: Adequate natural dentition Oral Motor / Sensory Function: Impaired - see Bedside swallow eval Self-Feeding Abilities: Able to feed self;Needs assist Patient Positioning: Upright in chair Baseline Vocal Quality: Low vocal intensity Volitional Cough: Weak Volitional Swallow: Able to elicit Anatomy: Within functional limits Pharyngeal Secretions: Not observed secondary MBS   Reason for Referral:   Objectively evaluate swallowing function    Oral Phase: Oral Preparation/Oral Phase Oral Phase: Impaired Oral - Solids Oral - Puree: Within functional limits Oral - Mechanical Soft: Within functional limits;Reduced posterior propulsion Oral - Regular: Within functional limits;Reduced posterior propulsion;Impaired mastication   Pharyngeal Phase:  Pharyngeal Phase Pharyngeal Phase: Impaired Pharyngeal - Thin Pharyngeal - Thin Cup: Delayed swallow initiation;Premature spillage to pyriform sinuses;Reduced airway/laryngeal closure;Penetration/Aspiration after swallow;Pharyngeal residue - valleculae;Pharyngeal residue - pyriform sinuses;Lateral channel residue;Compensatory strategies attempted (Comment);Reduced epiglottic inversion;Reduced laryngeal elevation;Reduced tongue base retraction (cues for throat clear effective at reducing penetrates) Penetration/Aspiration details (thin cup): Material enters airway, remains ABOVE vocal cords and not ejected out Pharyngeal - Thin Straw: Delayed swallow initiation;Premature spillage to pyriform sinuses;Pharyngeal residue - valleculae;Pharyngeal residue - pyriform sinuses;Lateral channel residue;Other (  Comment);Reduced epiglottic inversion;Reduced laryngeal elevation;Reduced tongue base retraction (no penertration or aspiratoin  observed but suspect that large bolus size could impact function) Pharyngeal - Solids Pharyngeal - Puree: Delayed swallow initiation;Premature spillage to valleculae;Reduced epiglottic inversion;Reduced laryngeal elevation;Reduced tongue base retraction;Pharyngeal residue - valleculae;Pharyngeal residue - pyriform sinuses;Compensatory strategies attempted (Comment) (cues for second swallow effective at reducing residue) Pharyngeal - Mechanical Soft: Delayed swallow initiation;Reduced epiglottic inversion;Reduced laryngeal elevation;Reduced tongue base retraction;Premature spillage to valleculae;Pharyngeal residue - valleculae;Pharyngeal residue - pyriform sinuses;Compensatory strategies attempted (Comment) (cues for second swallow effective at reducing residue) Pharyngeal - Regular: Delayed swallow initiation;Premature spillage to valleculae;Reduced epiglottic inversion;Reduced laryngeal elevation;Reduced tongue base retraction;Pharyngeal residue - valleculae;Pharyngeal residue - pyriform sinuses;Compensatory strategies attempted (Comment) (cues for second swallow effective at reducing residue)   Cervical Esophageal Phase  Cervical Esophageal Phase Cervical Esophageal Phase: [facility]   GN       Fae Pippin, M.A., CCC-SLP [phone]   Carling Liberman 05/15/2014, 10:24 AM

## 2014-05-15 NOTE — Progress Notes (Signed)
Speech Language Pathology Daily Session Note  Patient Details  Name: [name] MRN: [id number] Date of Birth: 08-13-1941  Today's Date: 05/15/2014 Time: 9735-3299 Time Calculation (min): 32 min  Short Term Goals: Week 1: SLP Short Term Goal 1 (Week 1): Pt will improve coordination of breath support with articulation for 80% speech intelligibilty in conversations with mod assist  SLP Short Term Goal 2 (Week 1): Pt will tolerate his currently prescribed diet with no overt s/s of aspiraiton and min cuing for use of swallowing precautions  SLP Short Term Goal 3 (Week 1): Pt will use external aids to facilitate improved orientaiton to place, date, and situation as well as improved recall of daily events and information for 75% accuracy with mod assist.  SLP Short Term Goal 4 (Week 1): Pt will identify 2 environmental modifications that he can use to faciltate improved speech intelligibility during conversations with skilled and unskilled communication partners.   SLP Short Term Goal 5 (Week 1): Pt will sustain attention to basic, familiar tasks for 15-20 minutes with min cuing for redirection.   SLP Short Term Goal 6 (Week 1): Pt will tolerate trials of upgraded textures with no overt s/s of aspiration.   Skilled Therapeutic Interventions:  Pt was seen for skilled speech therapy targeting skilled education related to this morning's MBS.  [name] was present for the duration of the therapy session and remained engaged and participatory in all provided education.  SLP reviewed the results and recommendations of today's MBS including normal and abnormal swallowing physiology and their impact on airway protection.  Educated pt and pt's wife that pt remains at risk for aspiration in the setting of progressive illness and will need close supervision for use of swallowing precautions during meals.  [name] reported that pt required frequent cuing for slow rate and to completely chew foods before  swallowing prior to admission and would cough upon completion of a meal.  SLP also updated pt's wife on current goals for conservative diet advancement and provided her with education related to currently prescribed dys 2 textures.  Continue per current plan of care.    FIM:  Comprehension Comprehension Mode: Auditory Comprehension: 4-Understands basic 75 - 89% of the time/requires cueing 10 - 24% of the time Expression Expression Mode: Verbal Expression: 3-Expresses basic 50 - 74% of the time/requires cueing 25 - 50% of the time. Needs to repeat parts of sentences. Social Interaction Social Interaction: 4-Interacts appropriately 75 - 89% of the time - Needs redirection for appropriate language or to initiate interaction. Problem Solving Problem Solving: 3-Solves basic 50 - 74% of the time/requires cueing 25 - 49% of the time Memory Memory: 3-Recognizes or recalls 50 - 74% of the time/requires cueing 25 - 49% of the time  Pain Pain Assessment Pain Assessment: No/denies pain  Therapy/Group: Individual Therapy  Jackalyn Lombard, M.A. CCC-SLP  Damarri Rampy, Melanee Spry 05/15/2014, 4:43 PM

## 2014-05-16 ENCOUNTER — Inpatient Hospital Stay (HOSPITAL_COMMUNITY): Payer: Medicare Other

## 2014-05-16 ENCOUNTER — Ambulatory Visit (HOSPITAL_COMMUNITY): Payer: Medicare Other | Admitting: Speech Pathology

## 2014-05-16 ENCOUNTER — Encounter (HOSPITAL_COMMUNITY): Payer: Medicare Other | Admitting: Occupational Therapy

## 2014-05-16 LAB — CREATININE, SERUM
Creatinine, Ser: 0.77 mg/dL (ref 0.50–1.35)
GFR calc non Af Amer: 87 mL/min — ABNORMAL LOW (ref >90)

## 2014-05-16 MED ORDER — QUETIAPINE 12.5 MG HALF TABLET
12.5000 mg | ORAL_TABLET | Freq: Every day | ORAL | Status: DC
  Administered 2014-05-16 – 2014-05-18 (×3): 12.5 mg via ORAL
  Filled 2014-05-16 (×4): qty 1

## 2014-05-16 MED ORDER — CARBIDOPA-LEVODOPA 25-100 MG PO TABS
2.0000 | ORAL_TABLET | Freq: Four times a day (QID) | ORAL | Status: DC
  Administered 2014-05-16 – 2014-05-24 (×32): 2 via ORAL
  Filled 2014-05-16 (×36): qty 2

## 2014-05-16 NOTE — Progress Notes (Signed)
73 y.o. right-handed male with history significant for Parkinson's disease status post deep brain stimulator by Dr. Angelyn Punt neurosurgery at [facility], CAD/pacemaker, dementia. Patient lives with his wife used a rollator prior to admission and goes to the gym to participate in a Parkinson's program. Admitted 05/05/2014 with chills, fever and generalized weakness with increasing confusion. Reports of recent tooth extraction 2 days prior to admission. Chest x-ray negative. Cranial CT scan with no acute intracranial abnormalities with bilateral deep brain stimulators via high bifrontal Burr holes  Subjective/Complaints: Has much more dyskesias, tremors in am.  Review of Systems - somewhat limited due to speech/cognitive  Objective: Vital Signs: Blood pressure 136/88, pulse 73, temperature 97.5 F (36.4 C), temperature source Oral, resp. rate 18, height 5\' 11"  (1.803 m), weight 75.3 kg (166 lb 0.1 oz), SpO2 91.00%. Dg Swallowing Func-speech Pathology  05/15/2014   Ophelia Shoulder, CCC-SLP     05/15/2014 12:30 PM   Objective Swallowing Evaluation: Modified Barium Swallowing Study   Patient Details  Name: [name] MRN: [id number] Date of Birth: 25-Jul-1941  Today's Date: 05/15/2014 Time: 0935-1000 Time Calculation (min): 25 min  Past Medical History:  Past Medical History  Diagnosis Date  . Parkinson disease   . Bradycardia     syncope due to prolonged pauses, resolved with PPM  . Sleep apnea   . Hypertension   . Dermatitis   . Depression   . Rib fractures 2011    S/p fall  . Maxillary fracture 2009    Right  . BPH (benign prostatic hypertrophy)   . Coronary artery disease 1986    Inferior wall MI treated with Angioplasty  . Normal echocardiogram 2007    Mild LVH with Impaired relaxation, Mild-Mod. Aortic Root  dilation, Mild Aortic Sclerosis, Mild MR, Mild TR, Ef-50-55%  . Normal nuclear stress test 2006    Normal EF-54%  . CAD (coronary artery disease)   . Inferior MI   . 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 Maxillary fracture 2009    Right side  . Rib fracture 07/2010  . Atrioventricular block, complete   . Pacemaker   . H/O hiatal hernia    Past Surgical History:  Past Surgical History  Procedure Laterality Date  . Pacemaker insertion  10/28/10    MDT implanted by Dr Ladona Ridgel  . Deep brain stimulator placement      Parkinsons  . Arthroscopic knee      Left knee  . Cataract surgery      Bilateral  . Inguinal hernia repair      Bilateral  . Cardiac catheterization      Multiple Angioplasties, last 2002  . Removal of ganglion cyst      right wrist  . Insertion / placement / revision neurostimulator    . Eye surgery    . Insert / replace / remove pacemaker     HPI:  [name] is a 73 year old male with past medical history  significant for Parkinson's disease status post deep brain  stimulator, seen at [facility] for most care, CAD, dementia admitted  with fevers and MD documentation states wife reports coughing  with meals. Underwent tooth extraction 2 days ago.  CT 6/26  revealed mild cardiomegaly, no acute pulmonary process.  CXR's  performed at [facility] since 2011 have been negative.  Suspect swallow  assessment performed at [facility] due to sign in room stating pt.  is not to use straws and pt.  stated "swallow test done 2 months  ago was ok." (? reliability with his dementia).  Patient  currently maintained on a dysphagia 2 thin liquid diet. Patient  was admitted for comprehensive rehabilitation program 05/09/14 and  has been participating in therapies.  Objective study warranted  to determine safest diet consistency in the setting of  progressive illness and inconsistent s/s of aspiration noted at  bedside.     Recommendation/Prognosis  Clinical Impression:   Dysphagia Diagnosis: Mild oral phase dysphagia;Mild pharyngeal  phase dysphagia Patient demonstrates a mild oral and pharyngeal phase  sensory-motor dysphagia. Oral phase deficits are characterized by  prolonged mastication, organization and transit of  boluses.   Pharyngeal phase deficits are characterized by a delayed swallow  initiation (to the level of the pyriform sinuses with thin  liquids) with decreased base of tongue retraction, laryngeal  elevation and epiglottic inversion resulting in silent  penetration of residuals post swallow with large boluses if thin  liquid sips.  Patient with residue in the vallecula, lateral  channels and pyriform sinuses post swallows that he required  consistent cues to manage with a second swallow. While, no  aspiration was observed during this study patient's cognition  (needs for consistent cues), distractibility and fatigue are also  factors to consider during a full meal with PO intake.  Recommend  to continue with current orders and strict full supervision  during PO intake.     Swallow Evaluation Recommendations:  Diet Recommendations: Dysphagia 2 (Fine chop);Thin liquid Liquid Administration via: Cup Medication Administration: Whole meds with liquid Supervision: Patient able to self feed;Full supervision/cueing  for compensatory strategies Compensations: Slow rate;Small sips/bites;Multiple dry swallows  after each bite/sip Postural Changes and/or Swallow Maneuvers: Seated upright 90  degrees;Upright 30-60 min after meal;Out of bed for meals Oral Care Recommendations: Oral care BID Follow up Recommendations: Outpatient SLP;24 hour  supervision/assistance    Prognosis:  Prognosis for Safe Diet Advancement: Good Barriers to Reach Goals: Cognitive deficits   Individuals Consulted: Consulted and Agree with Results and  Recommendations: Patient      SLP Assessment/Plan  Plan:  See Individualized Plan of care for details   Short Term Goals: Week 1: SLP Short Term Goal 1 (Week 1): Pt will  improve coordination of breath support with articulation for 80%  speech intelligibilty in conversations with mod assist  SLP Short Term Goal 2 (Week 1): Pt will tolerate his currently  prescribed diet with no overt s/s of aspiraiton and min  cuing for  use of swallowing precautions  SLP Short Term Goal 3 (Week 1): Pt will use external aids to  facilitate improved orientaiton to place, date, and situation as  well as improved recall of daily events and information for 75%  accuracy with mod assist.  SLP Short Term Goal 4 (Week 1): Pt will identify 2 environmental  modifications that he can use to faciltate improved speech  intelligibility during conversations with skilled and unskilled  communication partners.   SLP Short Term Goal 5 (Week 1): Pt will sustain attention to  basic, familiar tasks for 15-20 minutes with min cuing for  redirection.   SLP Short Term Goal 6 (Week 1): Pt will tolerate trials of  upgraded textures with no overt s/s of aspiration.     General: Date of Onset: 05/06/14 Type of Study: Modified Barium Swallowing Study Reason for Referral: Objectively evaluate swallowing function Previous Swallow Assessment: BSE, recommended dys 2 textures with  thin liquids Diet Prior to this Study: Dysphagia  2 (chopped);Thin liquids Temperature Spikes Noted: No Respiratory Status: Room air History of Recent Intubation: No Behavior/Cognition: Alert;Cooperative;Pleasant mood;Requires  cueing;Distractible Oral Cavity - Dentition: Adequate natural dentition Oral Motor / Sensory Function: Impaired - see Bedside swallow  eval Self-Feeding Abilities: Able to feed self;Needs assist Patient Positioning: Upright in chair Baseline Vocal Quality: Low vocal intensity Volitional Cough: Weak Volitional Swallow: Able to elicit Anatomy: Within functional limits Pharyngeal Secretions: Not observed secondary MBS   Reason for Referral:   Objectively evaluate swallowing function    Oral Phase: Oral Preparation/Oral Phase Oral Phase: Impaired Oral - Solids Oral - Puree: Within functional limits Oral - Mechanical Soft: Within functional limits;Reduced  posterior propulsion Oral - Regular: Within functional limits;Reduced posterior  propulsion;Impaired mastication    Pharyngeal Phase:  Pharyngeal Phase Pharyngeal Phase: Impaired Pharyngeal - Thin Pharyngeal - Thin Cup: Delayed swallow initiation;Premature  spillage to pyriform sinuses;Reduced airway/laryngeal  closure;Penetration/Aspiration after swallow;Pharyngeal residue -  valleculae;Pharyngeal residue - pyriform sinuses;Lateral channel  residue;Compensatory strategies attempted (Comment);Reduced  epiglottic inversion;Reduced laryngeal elevation;Reduced tongue  base retraction (cues for throat clear effective at reducing  penetrates) Penetration/Aspiration details (thin cup): Material enters  airway, remains ABOVE vocal cords and not ejected out Pharyngeal - Thin Straw: Delayed swallow initiation;Premature  spillage to pyriform sinuses;Pharyngeal residue -  valleculae;Pharyngeal residue - pyriform sinuses;Lateral channel  residue;Other (Comment);Reduced epiglottic inversion;Reduced  laryngeal elevation;Reduced tongue base retraction (no  penertration or aspiratoin observed but suspect that large bolus  size could impact function) Pharyngeal - Solids Pharyngeal - Puree: Delayed swallow initiation;Premature spillage  to valleculae;Reduced epiglottic inversion;Reduced laryngeal  elevation;Reduced tongue base retraction;Pharyngeal residue -  valleculae;Pharyngeal residue - pyriform sinuses;Compensatory  strategies attempted (Comment) (cues for second swallow effective  at reducing residue) Pharyngeal - Mechanical Soft: Delayed swallow initiation;Reduced  epiglottic inversion;Reduced laryngeal elevation;Reduced tongue  base retraction;Premature spillage to valleculae;Pharyngeal  residue - valleculae;Pharyngeal residue - pyriform  sinuses;Compensatory strategies attempted (Comment) (cues for  second swallow effective at reducing residue) Pharyngeal - Regular: Delayed swallow initiation;Premature  spillage to valleculae;Reduced epiglottic inversion;Reduced  laryngeal elevation;Reduced tongue base retraction;Pharyngeal  residue -  valleculae;Pharyngeal residue - pyriform  sinuses;Compensatory strategies attempted (Comment) (cues for  second swallow effective at reducing residue)   Cervical Esophageal Phase  Cervical Esophageal Phase [facility]erv[facility]ophageal [facility]   GN       Fae PippinMelissa Bowie, M.A., CCC-SLP [phone]   BOWIE,MELISSA 05/15/2014, 10:24 AM                    No results found for this or any previous visit (from the past 72 hour(s)).    Vitals reviewed.  HENT:  Head: Normocephalic.  Eyes: EOM are normal.  Neck: Normal range of motion. Neck supple. No thyromegaly present.  Cardiovascular: Normal rate and regular rhythm.  Respiratory: Effort normal and breath sounds normal. No respiratory distress.  GI: Soft. Bowel sounds are normal. He exhibits no distension.  Neurological:  Alert male in no acute distress. 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  motor 5/5 in B UE and BLE  chorea noted LUE  Cerebellar intact  No cogwheeling. Persistent tremors bilateral UE's    Assessment/Plan: 1. Functional deficits secondary to Parkinson's disease which require 3+ hours per day of interdisciplinary therapy in a comprehensive inpatient rehab setting. Physiatrist is providing close team supervision and 24 hour management of active medical problems listed below. Physiatrist and rehab team continue to  assess barriers to discharge/monitor patient progress toward functional and medical goals. FIM: FIM - Bathing Bathing Steps Patient Completed: Chest;Right Arm;Left Arm;Abdomen;Right upper leg;Left upper leg;Front perineal area;Buttocks;Right lower leg (including foot);Left lower leg (including foot) Bathing: 4: Steadying assist  FIM - Upper Body Dressing/Undressing Upper body dressing/undressing steps patient completed: Thread/unthread right sleeve of pullover shirt/dresss;Put head through opening of pull over  shirt/dress;Pull shirt over trunk Upper body dressing/undressing: 4: Min-Patient completed 75 plus % of tasks FIM - Lower Body Dressing/Undressing Lower body dressing/undressing steps patient completed: Thread/unthread right underwear leg;Thread/unthread left underwear leg;Thread/unthread right pants leg;Thread/unthread left pants leg;Fasten/unfasten right shoe;Don/Doff left shoe;Don/Doff right shoe;Fasten/unfasten left shoe;Fasten/unfasten pants;Pull underwear up/down;Pull pants up/down;Don/Doff right sock;Don/Doff left sock Lower body dressing/undressing: 4: Steadying Assist  FIM - Toileting Toileting steps completed by patient: Adjust clothing prior to toileting Toileting Assistive Devices: Grab bar or rail for support Toileting: 2: Max-Patient completed 1 of 3 steps  FIM - Diplomatic Services operational officer Devices: Grab bars Toilet Transfers: 5-To toilet/BSC: Supervision (verbal cues/safety issues);5-From toilet/BSC: Supervision (verbal cues/safety issues)  FIM - Banker Devices: Therapist, occupational: 5: Sit > Supine: Supervision (verbal cues/safety issues);5: Supine > Sit: Supervision (verbal cues/safety issues);5: Bed > Chair or W/C: Supervision (verbal cues/safety issues);5: Chair or W/C > Bed: Supervision (verbal cues/safety issues)  FIM - Locomotion: Wheelchair Distance: 75 Locomotion: Wheelchair: 0: Activity did not occur FIM - Locomotion: Ambulation Locomotion: Ambulation Assistive Devices: Other (comment) (rolator) Ambulation/Gait Assistance: 4: Min assist Locomotion: Ambulation: 4: Travels 150 ft or more with minimal assistance (Pt.>75%)  Comprehension Comprehension Mode: Auditory Comprehension: 4-Understands basic 75 - 89% of the time/requires cueing 10 - 24% of the time  Expression Expression Mode: Verbal Expression: 3-Expresses basic 50 - 74% of the time/requires cueing 25 - 50% of the time. Needs to repeat parts  of sentences.  Social Interaction Social Interaction: 4-Interacts appropriately 75 - 89% of the time - Needs redirection for appropriate language or to initiate interaction.  Problem Solving Problem Solving: 3-Solves basic 50 - 74% of the time/requires cueing 25 - 49% of the time  Memory Memory: 3-Recognizes or recalls 50 - 74% of the time/requires cueing 25 - 49% of the time   Medical Problem List and Plan:  1. Functional deficits secondary to Parkinson's disease with decline in functional status after fever of unknown etiology. Continue Sinemet as directed for Parkinson disease   -adjusted timing of sinemet  -will try reducing hs seroquel also 2. DVT Prophylaxis/Anticoagulation: Lovenox.  3. Pain Management: Tylenol as needed  4. Mood/dementia: Aricept 10 mg daily, Lexapro 10 mg daily, Seroquel 25 mg each bedtime (dec to 12.5). Bed alarm for safety    5. Neuropsych: This patient is not capable of making decisions on his own behalf.  6. Dysphagia. Dysphagia 3 thin liquid diet after MBS yesterday 7. Recent tooth extraction. Continue doxycycline for now  8. Overactive bladder..Myrbetriq 50 mg daily, Flomax 0.4 mg daily.  9. Hyperlipidemia. Zetia   LOS (Days) 7 A FACE TO FACE EVALUATION WAS PERFORMED  SWARTZ,ZACHARY T 05/16/2014, 8:52 AM

## 2014-05-16 NOTE — Progress Notes (Signed)
Speech Language Pathology Daily Session Note  Patient Details  Name: [name] MRN: [id number] Date of Birth: 05-18-1941  Today's Date: 05/16/2014 Time: 6063-0160 Time Calculation (min): 40 min  Short Term Goals: Week 1: SLP Short Term Goal 1 (Week 1): Pt will improve coordination of breath support with articulation for 80% speech intelligibilty in conversations with mod assist  SLP Short Term Goal 2 (Week 1): Pt will tolerate his currently prescribed diet with no overt s/s of aspiraiton and min cuing for use of swallowing precautions  SLP Short Term Goal 3 (Week 1): Pt will use external aids to facilitate improved orientaiton to place, date, and situation as well as improved recall of daily events and information for 75% accuracy with mod assist.  SLP Short Term Goal 4 (Week 1): Pt will identify 2 environmental modifications that he can use to faciltate improved speech intelligibility during conversations with skilled and unskilled communication partners.   SLP Short Term Goal 5 (Week 1): Pt will sustain attention to basic, familiar tasks for 15-20 minutes with min cuing for redirection.   SLP Short Term Goal 6 (Week 1): Pt will tolerate trials of upgraded textures with no overt s/s of aspiration.   Skilled Therapeutic Interventions:  Pt was seen for skilled speech therapy targeting dysphagia management.  Skilled observations were completed with presentations of dys 3 textures in the setting of a trial meal tray.  Pt required max faded to mod assist verbal cues for use of extra swallows to minimize s/s of aspiration with pt exhibiting intermittent, delayed swallow.  Pt's coughing was decreased in comparison to previous therapy sessions which SLP suspects was related to consistent use of swallowing precautions.  Pt also benefited from intermittent redirection as he was noted to become distracted by items on his meal tray.  During session, pt presented with improved carryover of swallowing  precautions but will continue to require supervision during meals due to fluctuating mentation.  Recommend a diet upgrade to dys 3 textures with continued thin liquids and full supervision.  Continue per current plan of care.     FIM:  Comprehension Comprehension Mode: Auditory Comprehension: 4-Understands basic 75 - 89% of the time/requires cueing 10 - 24% of the time Expression Expression Mode: Verbal Expression: 3-Expresses basic 50 - 74% of the time/requires cueing 25 - 50% of the time. Needs to repeat parts of sentences. Social Interaction Social Interaction: 4-Interacts appropriately 75 - 89% of the time - Needs redirection for appropriate language or to initiate interaction. Problem Solving Problem Solving: 3-Solves basic 50 - 74% of the time/requires cueing 25 - 49% of the time Memory Memory: 3-Recognizes or recalls 50 - 74% of the time/requires cueing 25 - 49% of the time FIM - Eating Eating Activity: 5: Needs verbal cues/supervision  Pain Pain Assessment Pain Assessment: No/denies pain  Therapy/Group: Individual Therapy  Jackalyn Lombard, M.A. CCC-SLP   Darria Corvera, Melanee Spry 05/16/2014, 8:48 AM

## 2014-05-16 NOTE — Progress Notes (Signed)
Occupational Therapy Session Note  Patient Details  Name: [name] MRN: [id number] Date of Birth: May 10, 1941  Today's Date: 05/16/2014 Time: 6010-9323 Time Calculation (min): 60 min  Short Term Goals: Week 1:  OT Short Term Goal 1 (Week 1): Patient will perform UB/LB bathing with minimal assistance OT Short Term Goal 2 (Week 1): Patient will don shirt with min assist OT Short Term Goal 3 (Week 1): Patient will perform LB dressing with moderate assistance OT Short Term Goal 4 (Week 1): Patient will be educated on a BUE functional strengthening HEP  Skilled Therapeutic Interventions/Progress Updates:  Patient received supine in bed asleep. Patient easy to awake and arouse. Patient engaged in bed mobility and sat EOB. Patient stood with rollator, requiring up to mod assist secondary to posterior lean. Patient then ambulated from EOB>bathroom for toilet transfer (min assist), toileting, then shower stall transfer. UB/LB bathing completed in sit<>stand position with up to moderate assistance to maintain dynamic standing balance while bathing. Patient transferred shower bench>w/c with minimal assistance. Grooming tasks & UB/LB dressing completed in sit<>stand position from sink level. Patient with increased posterior lean this am which decreases his independence with functional mobility/transfers and functional tasks. Patient also presented with increased impulsive behaviors. Throughout session, patient required up to moderate assistance to maintain dynamic standing balance and during functional ambulation/mobility; also requiring up to moderate multimodal cues for safety. Will discuss this with team during conference today. At end of session, left patient seated in w/c with PT present for next therapy session.  Precautions:  Precautions Precautions: Fall Restrictions Weight Bearing Restrictions: No  See FIM for current functional status  Therapy/Group: Individual  Therapy  Cecile Gillispie 05/16/2014, 10:48 AM

## 2014-05-16 NOTE — Progress Notes (Signed)
Physical Therapy Session Note  Patient Details  Name: [name] MRN: [id number] Date of Birth: 04-May-1941  Today's Date: 05/16/2014 Time: 3524-8185 & 9093-1121 Time Calculation (min): 59 min & 28 min  Short Term Goals: Week 1:  PT Short Term Goal 1 (Week 1): Pt will ambulate 47' w/ LRAD w/ MinA (met) PT Short Term Goal 2 (Week 1): Pt will transfers bed to/from w/c w/ LRAD w/ MinA (met) PT Short Term Goal 3 (Week 1): Pt will propel w/c 50' in controlled environment w/ MinA and min cues (met) PT Short Term Goal 4 (Week 1): Pt will perform all bed mobility with MinA (met)  Skilled Therapeutic Interventions/Progress Updates:  Session 1 (59 min): Pt. Supine in bed with HOB elevated. Pt. Ready for therapy.  Therapeutic Activities (TA): supine>sit (S) with verbal cue for safety. Sit<>stand x4 with (s) for posterior lean that pt. Is correcting for; occasional LOB with pt. descending into W/C or seating surface. Pt. Car transfer (S) with simple verbal command to initiate the activity and to uncross feet. Pt. Strong and able to complete transfer well; additional car transfers for therapy unneeded.  W/C mgmt: Pt. Requires (S) with use; frequent verbal commands for technique due to perseveration that occurs with intensity of task. Pt. Displays increased control with closed environment, and tends to be easily distracted with increased hallway/hospital obstacles. Motor planning appears minimal, and pt. Adapts by sitting still until stimulus are decreased.  Self care: Pt. Toileted with min A to steady with assistance for additional pericare (washcloth) for improved hygiene. Pt. Doff/donn shorts/brief min A due to therapist need to facilitate time requirements. Pt. Transferred with rolator x 2 to w/c for lunch.  Pt. Has no c/o pain.  Pt. Seated in W/C with gait belt donned and with all needs within reach.  Session 2 (28 min): Pt. Seated in w/c having fallen asleep. Pt. Feels tired and sleepy; will  try to participate in therapy. Therapeutic Activities: Transfers: sit<>stand x5 for improved sequencing and to correct for posterior lean and LOB resulting in additional sitting/multi-tries. Pt. Sit>supine (S) with mod I bed mobility.  Self Care: Pt. Toileted with x2 sit<>Stand transfers at (S) and with environment modification by therapist for safety. No additional pericare needed due to improved set-up by pt. Pt. Performed donn/doff of shorts/breifs with steady for safety.  Pt. Has no c/o pain during session.  Pt. Supine in bed with wife present. All needs within reach. Pt. Quickly fell asleep; wife states he always sleeps after lunch. RN notified of pt. BM.  Therapy Documentation Precautions:  Precautions Precautions: Fall Restrictions Weight Bearing Restrictions: No Locomotion : Ambulation Ambulation/Gait Assistance: 4: Min assist Wheelchair Mobility Distance: 250   See FIM for current functional status  Therapy/Group: Individual Therapy  Juluis Mire 05/16/2014, 4:10 PM

## 2014-05-17 ENCOUNTER — Ambulatory Visit (HOSPITAL_COMMUNITY): Payer: Medicare Other | Admitting: Speech Pathology

## 2014-05-17 ENCOUNTER — Encounter (HOSPITAL_COMMUNITY): Payer: Medicare Other | Admitting: Occupational Therapy

## 2014-05-17 ENCOUNTER — Inpatient Hospital Stay (HOSPITAL_COMMUNITY): Payer: Medicare Other

## 2014-05-17 DIAGNOSIS — F0390 Unspecified dementia without behavioral disturbance: Secondary | ICD-10-CM

## 2014-05-17 DIAGNOSIS — G20 Parkinson's disease: Secondary | ICD-10-CM

## 2014-05-17 DIAGNOSIS — I4891 Unspecified atrial fibrillation: Secondary | ICD-10-CM

## 2014-05-17 LAB — URINALYSIS, ROUTINE W REFLEX MICROSCOPIC
BILIRUBIN URINE: NEGATIVE
Glucose, UA: NEGATIVE mg/dL
HGB URINE DIPSTICK: NEGATIVE
Ketones, ur: 15 mg/dL — AB
Leukocytes, UA: NEGATIVE
Nitrite: NEGATIVE
PROTEIN: NEGATIVE mg/dL
SPECIFIC GRAVITY, URINE: 1.025 (ref 1.005–1.030)
UROBILINOGEN UA: 0.2 mg/dL (ref 0.0–1.0)
pH: 5 (ref 5.0–8.0)

## 2014-05-17 MED ORDER — ENSURE PUDDING PO PUDG
1.0000 | Freq: Three times a day (TID) | ORAL | Status: DC
  Administered 2014-05-17 – 2014-05-24 (×19): 1 via ORAL

## 2014-05-17 NOTE — Progress Notes (Signed)
Occupational Therapy Session Note  Patient Details  Name: [name] MRN: [id number] Date of Birth: Dec 11, 1940  Today's Date: 05/17/2014 Time: 1980-2217 Time Calculation (min): 55 min  Short Term Goals: Week 1:  OT Short Term Goal 1 (Week 1): Patient will perform UB/LB bathing with minimal assistance OT Short Term Goal 1 - Progress (Week 1): Met OT Short Term Goal 2 (Week 1): Patient will don shirt with min assist OT Short Term Goal 2 - Progress (Week 1): Met OT Short Term Goal 3 (Week 1): Patient will perform LB dressing with moderate assistance OT Short Term Goal 3 - Progress (Week 1): Met OT Short Term Goal 4 (Week 1): Patient will be educated on a BUE functional strengthening HEP OT Short Term Goal 4 - Progress (Week 1): Met  Week 2:  OT Short Term Goal 1 (Week 2): Short Term Goals = Long Term Goals secondary to estimated d/c date of 05/24/14  Skilled Therapeutic Interventions/Progress Updates:  Patient received supine in bed. Patient donned yellow gripper socks, then engaged in supine>sidelying>sit with supervision. Patient stood with rollator with minimal assistance, and minimal posterior lean. Patient ambulated into bathroom for toilet transfer, toileting, then shower stall transfer. UB/LB bathing completed in sit<>stand position in shower. After shower, patient donned bilateral yellow gripper socks then ambulated > w/c in front of sink with min assist. From here, patient completed grooming tasks, then UB/LB dressing sin sit<>stand position. Patient's posterior lean was not as severe as yesterday, but still present. Due to posterior lean, patient required min assist for transfers and min multimodal cues throughout session. At end of session, left patient seated in w/c with quick release belt donned and all needs within reach.   Precautions:  Precautions Precautions: Fall Restrictions Weight Bearing Restrictions: No  See FIM for current functional status  Therapy/Group:  Individual Therapy  Myrta Mercer 05/17/2014, 10:46 AM

## 2014-05-17 NOTE — Progress Notes (Signed)
Speech Language Pathology Daily Session Note  Patient Details  Name: [name] MRN: [id number] Date of Birth: August 23, 1941  Today's Date: 05/17/2014 Time: 0383-3383 Time Calculation (min): 42 min  Short Term Goals: Week 1: SLP Short Term Goal 1 (Week 1): Pt will improve coordination of breath support with articulation for 80% speech intelligibilty in conversations with mod assist  SLP Short Term Goal 2 (Week 1): Pt will tolerate his currently prescribed diet with no overt s/s of aspiraiton and min cuing for use of swallowing precautions  SLP Short Term Goal 3 (Week 1): Pt will use external aids to facilitate improved orientaiton to place, date, and situation as well as improved recall of daily events and information for 75% accuracy with mod assist.  SLP Short Term Goal 4 (Week 1): Pt will identify 2 environmental modifications that he can use to faciltate improved speech intelligibility during conversations with skilled and unskilled communication partners.   SLP Short Term Goal 5 (Week 1): Pt will sustain attention to basic, familiar tasks for 15-20 minutes with min cuing for redirection.   SLP Short Term Goal 6 (Week 1): Pt will tolerate trials of upgraded textures with no overt s/s of aspiration.   Skilled Therapeutic Interventions:  Pt was seen for skilled speech therapy targeting dysphagia management.  Pt recalled 2 swallowing precautions which were targeted in yesterday's therapy session with min question cues.  Pt required intermittent min assist verbal cues for use of extra swallows during his meal; however, he was modified independent for rate and monitoring of bite size.  No overt s/s of aspiration were noted throughout the duration of his entire meal or following his meal.  SLP facilitated session with min instructional cues for use of loud voicing to facilitate improved speech intelligibility to be able to communicate needs/wants.  Pt was able to achieve significantly improved vocal  intensity during structured sustained phonation tasks for periods of 7-10 seconds.  Pt was also able to carryover increased vocal intensity during counting and sentence repetition tasks with min instructional cues.  Continue per current plan of care.   FIM:  Comprehension Comprehension Mode: Auditory Comprehension: 4-Understands basic 75 - 89% of the time/requires cueing 10 - 24% of the time Expression Expression Mode: Verbal Expression: 3-Expresses basic 50 - 74% of the time/requires cueing 25 - 50% of the time. Needs to repeat parts of sentences. Social Interaction Social Interaction: 4-Interacts appropriately 75 - 89% of the time - Needs redirection for appropriate language or to initiate interaction. Problem Solving Problem Solving: 3-Solves basic 50 - 74% of the time/requires cueing 25 - 49% of the time Memory Memory: 3-Recognizes or recalls 50 - 74% of the time/requires cueing 25 - 49% of the time FIM - Eating Eating Activity: 5: Supervision/cues  Pain Pain Assessment Pain Assessment: No/denies pain  Therapy/Group: Individual Therapy  Jackalyn Lombard, M.A. CCC-SLP  [name], Melanee Spry 05/17/2014, 11:29 AM

## 2014-05-17 NOTE — Progress Notes (Signed)
Occupational Therapy Weekly Progress Note  Patient Details  Name: [name] MRN: [id number] Date of Birth: Mar 30, 1941  Beginning of progress report period: May 09, 2014 End of progress report period: May 17, 2014  Today's Date: 05/17/2014  Patient has met 4 of 4 short term goals. Patient is currently making slow progress on CIR. Patient's level of independence fluctuates from day to day and sometimes from session to session. On evaluation, patient required min>mod assist for functional transfers, over the week he progressed to supervision>min assist, and the past couple days has regressed back to min>mod assist. Patient continues to require up to moderate assistance for multimodal cues for safety during ADLs and mobility. Patient's wife has been present for a few OT sessions, she states that she has been providing 24/7 supervision/assisting. Goals set for 24/7 supervision>min assist once patient discharges > home. Plan is for patient to discharge 7/15 > home with wife.   Patient continues to demonstrate the following deficits: decreased independence with BADLs, decreased independence with functional mobility/transfers, decreased overall activity tolerance/endurance, increased posterior lean, decreased safety awareness, increased impulsive behaviors, and impaired overall cognition. Therefore, patient will continue to benefit from skilled OT intervention to enhance overall performance with BADL and Reduce care partner burden.  Patient progressing toward long term goals..  Continue plan of care for now. Will modify if/as needed.   OT Short Term Goals Week 1:  OT Short Term Goal 1 (Week 1): Patient will perform UB/LB bathing with minimal assistance OT Short Term Goal 1 - Progress (Week 1): Met OT Short Term Goal 2 (Week 1): Patient will don shirt with min assist OT Short Term Goal 2 - Progress (Week 1): Met OT Short Term Goal 3 (Week 1): Patient will perform LB dressing with moderate  assistance OT Short Term Goal 3 - Progress (Week 1): Met OT Short Term Goal 4 (Week 1): Patient will be educated on a BUE functional strengthening HEP OT Short Term Goal 4 - Progress (Week 1): Met  Week 2:  OT Short Term Goal 1 (Week 2): Short Term Goals = Long Term Goals secondary to estimated d/c date of 05/24/14  Skilled Therapeutic Interventions/Progress Updates:  Balance/vestibular training;Cognitive remediation/compensation;Community reintegration;Discharge planning;DME/adaptive equipment instruction;Disease mangement/prevention;Functional mobility training;Neuromuscular re-education;Pain management;Patient/family education;Psychosocial support;Self Care/advanced ADL retraining;Skin care/wound managment;Splinting/orthotics;Therapeutic Activities;Therapeutic Exercise;UE/LE Strength taining/ROM;UE/LE Coordination activities;Wheelchair propulsion/positioning   Precautions:  Precautions Precautions: Fall Restrictions Weight Bearing Restrictions: No  Vital Signs: Therapy Vitals Temp: 97.4 F (36.3 C) Temp src: Oral Pulse Rate: 78 Resp: 17 BP: 128/83 mmHg Oxygen Therapy SpO2: 94 % O2 Device: None (Room air)  See FIM for current functional status  Alaura Schippers 05/17/2014, 7:31 AM

## 2014-05-17 NOTE — Progress Notes (Signed)
73 y.o. right-handed male with history significant for Parkinson's disease status post deep brain stimulator by Dr. Salomon Fick neurosurgery at [facility], [place], dementia. Patient lives with his wife used a rollator prior to admission and goes to the gym to participate in a Parkinson's program. Admitted 05/05/2014 with chills, fever and generalized weakness with increasing confusion. Reports of recent tooth extraction 2 days prior to admission. Chest x-ray negative. Cranial CT scan with no acute intracranial abnormalities with bilateral deep brain stimulators via high bifrontal Burr holes  Subjective/Complaints: Has much more dyskesias, tremors in am.  Review of Systems - somewhat limited due to speech/cognitive  Objective: Vital Signs: Blood pressure 128/83, pulse 78, temperature 97.4 F (36.3 C), temperature source Oral, resp. rate 17, height _0  (1.803 m), weight 74.39 kg (164 lb), SpO2 94.00%. Dg Swallowing Func-speech Pathology  05/15/2014   [name], CCC-SLP     05/15/2014 12:30 PM   Objective Swallowing Evaluation: Modified Barium Swallowing Study   Patient Details  Name: [name] MRN: [id number] Date of Birth: January 10, 1941  Today's Date: 05/15/2014 Time: 0935-1000 Time Calculation (min): 25 min  Past Medical History:  Past Medical History  Diagnosis Date  . Parkinson disease   . Bradycardia     syncope due to prolonged pauses, resolved with PPM  . Sleep apnea   . Hypertension   . Dermatitis   . Depression   . Rib fractures 2011    S/p fall  . Maxillary fracture 2009    Right  . BPH (benign prostatic hypertrophy)   . Coronary artery disease 1986    Inferior wall MI treated with Angioplasty  . Normal echocardiogram 2007    Mild LVH with Impaired relaxation, Mild-Mod. Aortic Root  dilation, Mild Aortic Sclerosis, Mild MR, Mild TR, Ef-50-55%  . Normal nuclear stress test 2006    Normal EF-54%  . CAD (coronary artery disease)   . Inferior MI   . 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 Maxillary fracture 2009    Right side  . Rib fracture 07/2010  . Atrioventricular block, complete   . Pacemaker   . H/O hiatal hernia    Past Surgical History:  Past Surgical History  Procedure Laterality Date  . Pacemaker insertion  10/28/10    MDT implanted by Dr Lovena Le  . Deep brain stimulator placement      Parkinsons  . Arthroscopic knee      Left knee  . Cataract surgery      Bilateral  . Inguinal hernia repair      Bilateral  . Cardiac catheterization      Multiple Angioplasties, last 2002  . Removal of ganglion cyst      right wrist  . Insertion / placement / revision neurostimulator    . Eye surgery    . Insert / replace / remove pacemaker     HPI:  [name] is a 73 year old male with past medical history  significant for Parkinson's disease status post deep brain  stimulator, seen at [facility] for most care, CAD, dementia admitted  with fevers and MD documentation states wife reports coughing  with meals. Underwent tooth extraction 2 days ago.  CT 6/26  revealed mild cardiomegaly, no acute pulmonary process.  CXR's  performed at [facility] since 2011 have been negative.  Suspect swallow  assessment performed at [facility] due to sign in room stating pt.  is not to use straws and pt. stated "swallow  test done 2 months  ago was ok." (? reliability with his dementia).  Patient  currently maintained on a dysphagia 2 thin liquid diet. Patient  was admitted for comprehensive rehabilitation program 05/09/14 and  has been participating in therapies.  Objective study warranted  to determine safest diet consistency in the setting of  progressive illness and inconsistent s/s of aspiration noted at  bedside.     Recommendation/Prognosis  Clinical Impression:   Dysphagia Diagnosis: Mild oral phase dysphagia;Mild pharyngeal  phase dysphagia Patient demonstrates a mild oral and pharyngeal phase  sensory-motor dysphagia. Oral phase deficits are characterized by  prolonged mastication, organization and transit of boluses.    Pharyngeal phase deficits are characterized by a delayed swallow  initiation (to the level of the pyriform sinuses with thin  liquids) with decreased base of tongue retraction, laryngeal  elevation and epiglottic inversion resulting in silent  penetration of residuals post swallow with large boluses if thin  liquid sips.  Patient with residue in the vallecula, lateral  channels and pyriform sinuses post swallows that he required  consistent cues to manage with a second swallow. While, no  aspiration was observed during this study patient's cognition  (needs for consistent cues), distractibility and fatigue are also  factors to consider during a full meal with PO intake.  Recommend  to continue with current orders and strict full supervision  during PO intake.     Swallow Evaluation Recommendations:  Diet Recommendations: Dysphagia 2 (Fine chop);Thin liquid Liquid Administration via: Cup Medication Administration: Whole meds with liquid Supervision: Patient able to self feed;Full supervision/cueing  for compensatory strategies Compensations: Slow rate;Small sips/bites;Multiple dry swallows  after each bite/sip Postural Changes and/or Swallow Maneuvers: Seated upright 90  degrees;Upright 30-60 min after meal;Out of bed for meals Oral Care Recommendations: Oral care BID Follow up Recommendations: Outpatient SLP;24 hour  supervision/assistance    Prognosis:  Prognosis for Safe Diet Advancement: Good Barriers to Reach Goals: Cognitive deficits   Individuals Consulted: Consulted and Agree with Results and  Recommendations: Patient      SLP Assessment/Plan  Plan:  See Individualized Plan of care for details   Short Term Goals: Week 1: SLP Short Term Goal 1 (Week 1): Pt will  improve coordination of breath support with articulation for 80%  speech intelligibilty in conversations with mod assist  SLP Short Term Goal 2 (Week 1): Pt will tolerate his currently  prescribed diet with no overt s/s of aspiraiton and min cuing for   use of swallowing precautions  SLP Short Term Goal 3 (Week 1): Pt will use external aids to  facilitate improved orientaiton to place, date, and situation as  well as improved recall of daily events and information for 75%  accuracy with mod assist.  SLP Short Term Goal 4 (Week 1): Pt will identify 2 environmental  modifications that he can use to faciltate improved speech  intelligibility during conversations with skilled and unskilled  communication partners.   SLP Short Term Goal 5 (Week 1): Pt will sustain attention to  basic, familiar tasks for 15-20 minutes with min cuing for  redirection.   SLP Short Term Goal 6 (Week 1): Pt will tolerate trials of  upgraded textures with no overt s/s of aspiration.     General: Date of Onset: 05/06/14 Type of Study: Modified Barium Swallowing Study Reason for Referral: Objectively evaluate swallowing function Previous Swallow Assessment: BSE, recommended dys 2 textures with  thin liquids Diet Prior to this Study: Dysphagia 2 (chopped);Thin  liquids Temperature Spikes Noted: No Respiratory Status: Room air History of Recent Intubation: No Behavior/Cognition: Alert;Cooperative;Pleasant mood;Requires  cueing;Distractible Oral Cavity - Dentition: Adequate natural dentition Oral Motor / Sensory Function: Impaired - see Bedside swallow  eval Self-Feeding Abilities: Able to feed self;Needs assist Patient Positioning: Upright in chair Baseline Vocal Quality: Low vocal intensity Volitional Cough: Weak Volitional Swallow: Able to elicit Anatomy: Within functional limits Pharyngeal Secretions: Not observed secondary MBS   Reason for Referral:   Objectively evaluate swallowing function    Oral Phase: Oral Preparation/Oral Phase Oral Phase: Impaired Oral - Solids Oral - Puree: Within functional limits Oral - Mechanical Soft: Within functional limits;Reduced  posterior propulsion Oral - Regular: Within functional limits;Reduced posterior  propulsion;Impaired mastication   Pharyngeal Phase:   Pharyngeal Phase Pharyngeal Phase: Impaired Pharyngeal - Thin Pharyngeal - Thin Cup: Delayed swallow initiation;Premature  spillage to pyriform sinuses;Reduced airway/laryngeal  closure;Penetration/Aspiration after swallow;Pharyngeal residue -  valleculae;Pharyngeal residue - pyriform sinuses;Lateral channel  residue;Compensatory strategies attempted (Comment);Reduced  epiglottic inversion;Reduced laryngeal elevation;Reduced tongue  base retraction (cues for throat clear effective at reducing  penetrates) Penetration/Aspiration details (thin cup): Material enters  airway, remains ABOVE vocal cords and not ejected out Pharyngeal - Thin Straw: Delayed swallow initiation;Premature  spillage to pyriform sinuses;Pharyngeal residue -  valleculae;Pharyngeal residue - pyriform sinuses;Lateral channel  residue;Other (Comment);Reduced epiglottic inversion;Reduced  laryngeal elevation;Reduced tongue base retraction (no  penertration or aspiratoin observed but suspect that large bolus  size could impact function) Pharyngeal - Solids Pharyngeal - Puree: Delayed swallow initiation;Premature spillage  to valleculae;Reduced epiglottic inversion;Reduced laryngeal  elevation;Reduced tongue base retraction;Pharyngeal residue -  valleculae;Pharyngeal residue - pyriform sinuses;Compensatory  strategies attempted (Comment) (cues for second swallow effective  at reducing residue) Pharyngeal - Mechanical Soft: Delayed swallow initiation;Reduced  epiglottic inversion;Reduced laryngeal elevation;Reduced tongue  base retraction;Premature spillage to valleculae;Pharyngeal  residue - valleculae;Pharyngeal residue - pyriform  sinuses;Compensatory strategies attempted (Comment) (cues for  second swallow effective at reducing residue) Pharyngeal - Regular: Delayed swallow initiation;Premature  spillage to valleculae;Reduced epiglottic inversion;Reduced  laryngeal elevation;Reduced tongue base retraction;Pharyngeal  residue - valleculae;Pharyngeal  residue - pyriform  sinuses;Compensatory strategies attempted (Comment) (cues for  second swallow effective at reducing residue)   Cervical Esophageal Phase  Cervical Esophageal Phase Cervical Esophageal Phase: [facility]   GN       Carmelia Roller., CCC-SLP [phone]   BOWIE,MELISSA 05/15/2014, 10:24 AM                    Results for orders placed during the hospital encounter of 05/09/14 (from the past 72 hour(s))  CREATININE, SERUM     Status: Abnormal   Collection Time    05/16/14  8:15 AM      Result Value Ref Range   Creatinine, Ser 0.77  0.50 - 1.35 mg/dL   GFR calc non Af Amer 87 (*) >90 mL/min   GFR calc Af Amer >90  >90 mL/min   Comment: (NOTE)     The eGFR has been calculated using the CKD EPI equation.     This calculation has not been validated in all clinical situations.     eGFR's persistently <90 mL/min signify possible Chronic Kidney     Disease.      Vitals reviewed.  HENT:  Head: Normocephalic.  Eyes: EOM are normal.  Neck: Normal range of motion. Neck supple. No thyromegaly present.  Cardiovascular: Normal rate and regular rhythm.  Respiratory: Effort normal and breath sounds normal. No respiratory distress.  GI: Soft. Bowel  sounds are normal. He exhibits no distension.  Neurological:  Alert male in no acute distress. 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  motor 5/5 in B UE and BLE  chorea noted LUE  Cerebellar intact  No cogwheeling. Persistent tremors bilateral UE's    Assessment/Plan: 1. Functional deficits secondary to Parkinson's disease which require 3+ hours per day of interdisciplinary therapy in a comprehensive inpatient rehab setting. Physiatrist is providing close team supervision and 24 hour management of active medical problems listed below. Physiatrist and rehab team continue to assess barriers to discharge/monitor patient progress toward  functional and medical goals. FIM: FIM - Bathing Bathing Steps Patient Completed: Chest;Right Arm;Left Arm;Abdomen;Right upper leg;Left upper leg;Front perineal area;Buttocks;Right lower leg (including foot);Left lower leg (including foot) Bathing: 3: Mod-Patient completes 5-7 27f10 parts or 50-74% (mod assist to maintain dynamic standing balance)  FIM - Upper Body Dressing/Undressing Upper body dressing/undressing steps patient completed: Thread/unthread right sleeve of pullover shirt/dresss;Put head through opening of pull over shirt/dress;Pull shirt over trunk Upper body dressing/undressing: 4: Min-Patient completed 75 plus % of tasks (increased confusion with front & back of shirt) FIM - Lower Body Dressing/Undressing Lower body dressing/undressing steps patient completed: Thread/unthread right underwear leg;Thread/unthread left underwear leg;Thread/unthread right pants leg;Thread/unthread left pants leg;Fasten/unfasten right shoe;Don/Doff left shoe;Don/Doff right shoe;Fasten/unfasten left shoe;Fasten/unfasten pants;Pull underwear up/down;Pull pants up/down;Don/Doff right sock;Don/Doff left sock Lower body dressing/undressing: 4: Steadying Assist  FIM - Toileting Toileting steps completed by patient: Adjust clothing prior to toileting;Performs perineal hygiene Toileting Assistive Devices: Grab bar or rail for support Toileting: 3: Mod-Patient completed 2 of 3 steps  FIM - TRadio producerDevices: Grab bars;Walker Toilet Transfers: 4-To toilet/BSC: Min A (steadying Pt. > 75%);4-From toilet/BSC: Min A (steadying Pt. > 75%)  FIM - Bed/Chair Transfer Bed/Chair Transfer Assistive Devices: WCopy 5: Supine > Sit: Supervision (verbal cues/safety issues);3: Bed > Chair or W/C: Mod A (lift or lower assist)  FIM - Locomotion: Wheelchair Distance: 250 Locomotion: Wheelchair: 5: Travels 150 ft or more: maneuvers on rugs and over door sills with  supervision, cueing or coaxing FIM - Locomotion: Ambulation Locomotion: Ambulation Assistive Devices: Other (comment) (rolator) Ambulation/Gait Assistance: 4: Min assist Locomotion: Ambulation: 4: Travels 150 ft or more with minimal assistance (Pt.>75%)  Comprehension Comprehension Mode: Auditory Comprehension: 4-Understands basic 75 - 89% of the time/requires cueing 10 - 24% of the time  Expression Expression Mode: Verbal Expression: 3-Expresses basic 50 - 74% of the time/requires cueing 25 - 50% of the time. Needs to repeat parts of sentences.  Social Interaction Social Interaction: 4-Interacts appropriately 75 - 89% of the time - Needs redirection for appropriate language or to initiate interaction.  Problem Solving Problem Solving: 3-Solves basic 50 - 74% of the time/requires cueing 25 - 49% of the time  Memory Memory: 3-Recognizes or recalls 50 - 74% of the time/requires cueing 25 - 49% of the time   Medical Problem List and Plan:  1. Functional deficits secondary to Parkinson's disease with decline in functional status after fever of unknown etiology. Continue Sinemet as directed for Parkinson disease   -adjusted timing of sinemet  - reducing hs seroquel also---may stop all together 2. DVT Prophylaxis/Anticoagulation: Lovenox.  3. Pain Management: Tylenol as needed  4. Mood/dementia: Aricept 10 mg daily, Lexapro 10 mg daily, Seroquel 25 mg each bedtime (dec to 12.5). Bed alarm for safety  5. Neuropsych: This patient is not capable of making decisions on his own behalf.  6. Dysphagia. Dysphagia 3 thin liquid diet after MBS yesterday 7. Recent tooth extraction. Continue doxycycline for now  8. Overactive bladder..Myrbetriq 50 mg daily, Flomax 0.4 mg daily.   -recheck urine once more given fatigue, decline noted by some therapists 9. Hyperlipidemia. Zetia   LOS (Days) 8 A FACE TO FACE EVALUATION WAS PERFORMED  [name] T 05/17/2014, 9:06 AM

## 2014-05-17 NOTE — Progress Notes (Signed)
Social Work Patient ID: [name], male   DOB: 05-05-1941, 73 y.o.   MRN: [id number]  Amada JupiterLucy Corisa Montini, LCSW Social Worker Signed  Patient Care Conference Service date: 05/17/2014 10:20 AM  Inpatient RehabilitationTeam Conference and Plan of Care Update Date: 05/16/2014   Time: 2:10 PM     Patient Name: [name] BogusRobert J Stopa       Medical Record Number: [id number]   Date of Birth: 05-05-1941 Sex: Male         Room/Bed: 4W10C/4W10C-01 Payor Info: Payor: MEDICARE / Plan: MEDICARE PART A AND B / Product Type: *No Product type* /   Admitting Diagnosis: Parkinson's   Admit Date/Time:  05/09/2014  3:44 PM Admission Comments: No comment available   Primary Diagnosis:  <principal problem not specified> Principal Problem: <principal problem not specified>    Patient Active Problem List     Diagnosis  Date Noted   .  Parkinson disease  05/09/2014   .  Fever  05/05/2014   .  Osteopenia  03/06/2014   .  Postsurgical percutaneous transluminal coronary angioplasty status  08/23/2013   .  Healed myocardial infarct  08/23/2013   .  Obstructive apnea  08/23/2013   .  Artificial cardiac pacemaker  08/23/2013   .  Unspecified vitamin D deficiency  02/03/2013   .  B12 deficiency  02/03/2013   .  Dizziness  06/10/2012   .  Dermatochalasis of eyelid  03/04/2012   .  Error, refractive, myopia  03/04/2012   .  Posterior vitreous detachment  03/04/2012   .  Pseudoaphakia  03/04/2012   .  Atrioventricular block, complete     .  Idiopathic Parkinson's disease  08/29/2011   .  Hematoma of chest wall  08/26/2011   .  Hyperlipidemia  08/22/2011   .  CAD (coronary artery disease)  08/22/2011   .  Dementia  08/22/2011   .  Atrial fibrillation  01/21/2011   .  CARDIAC PACEMAKER IN SITU  01/21/2011   .  PARKINSON'S DISEASE  11/18/2007   .  MYOCARDIAL INFARCTION  11/18/2007   .  DERMATITIS HERPETIFORMIS  11/18/2007   .  SLEEP APNEA  11/18/2007     Expected Discharge Date: Expected Discharge Date:  05/24/14  Team Members Present: Physician leading conference: Dr. Faith RogueZachary Swartz Social Worker Present: Amada JupiterLucy Rayane Gallardo, LCSW Nurse Present: Carlean PurlMaryann Barbour, RN PT Present: Hosie SpangleJess Godfrey, Conchita ParisPT;Mandy Hyslop, PT OT Present: Donzetta KohutFrank Barthold, OT;Patricia Mat Carnelay, OT SLP Present: Feliberto Gottronourtney Payne, SLP Other (Discipline and Name): Ottie GlazierBarbara Boyette, RN [facility]) PPS Coordinator present : Tora DuckMarie Noel, RN, CRRN;Becky Henrene DodgeWindsor, PT        Current Status/Progress  Goal  Weekly Team Focus   Medical     severe PD. deconditioning, dementia  improve safety, coordination, and exercise tolerance  improve swallowing, safety, reduce tremors   Bowel/Bladder     Continent of bladder and bowel, incont at times, LBM 05/15/14  To be continnet of bladder and bowel  Timed toileting during day   Swallow/Nutrition/ Hydration     mod assist for use of swallowing precautions, upgraded to dys 3 textures per MBS yesterday  supervision with least restrictive diet   Carryover of safe swallowing strategies    ADL's     overall min>mod assist secondary to posterior lean & impulsive behaviors  overall supervision  ADL retraining, dynamic standing, sit<>stands, functional mobility/transfers, overall activity tolerance/endurance, functional strengtening   Mobility     min-mod A due to LOBs and bowel control  supervision/set up  gait with rolator, transfers, bed mobility, ambulating community distance   Communication     mod assist for speech intelligibility   min assist   carryover of compensatory strategies    Safety/Cognition/ Behavioral Observations    mod-max assist   min assist   orientation, memory, basic problem solving, awareness   Pain     NO c/o pain  Pain level <3  Assess and treat for pain q shift   Skin     No skin issues  No skin breakdown  Assess skin q shift    Rehab Goals Patient on target to meet rehab goals: Yes *See Care Plan and progress notes for long and short-term goals.    Barriers to Discharge:  severe PD and  associated symptoms      Possible Resolutions to Barriers:    adjusting regimen, adaptive equipment, family education      Discharge Planning/Teaching Needs:    home with wife to provide 24/7 assistance (has some private duty assist two days per wk)      Team Discussion:    Severe PD;  Declining in overall functioning since admit? Had been steady assistance and not concern that can reach min assist goals.  Cognition fluctuates.  SW to follow up with wife to confirm that she can provide min assist   Revisions to Treatment Plan:    None    Continued Need for Acute Rehabilitation Level of Care: The patient requires daily medical management by a physician with specialized training in physical medicine and rehabilitation for the following conditions: Daily direction of a multidisciplinary physical rehabilitation program to ensure safe treatment while eliciting the highest outcome that is of practical value to the patient.: Yes Daily medical management of patient stability for increased activity during participation in an intensive rehabilitation regime.: Yes Daily analysis of laboratory values and/or radiology reports with any subsequent need for medication adjustment of medical intervention for : Neurological problems;Pulmonary problems  Haylen Bellotti 05/17/2014, 10:20 AM

## 2014-05-17 NOTE — Progress Notes (Signed)
Physical Therapy Session Note  Patient Details  Name: [name] MRN: [id number] Date of Birth: 06/10/1941  Today's Date: 05/17/2014 Time: 0086-7619 Time Calculation (min): 25 min  Short Term Goals: Week 1:  PT Short Term Goal 1 (Week 1): Pt will ambulate 68' w/ LRAD w/ MinA PT Short Term Goal 2 (Week 1): Pt will transfers bed to/from w/c w/ LRAD w/ MinA PT Short Term Goal 3 (Week 1): Pt will propel w/c 50' in controlled environment w/ MinA and min cues PT Short Term Goal 4 (Week 1): Pt will perform all bed mobility with MinA  Skilled Therapeutic Interventions/Progress Updates:  Session 1 (59 min): Pt. Seated in W/C with OT present. OT indicates increased hand tremors; also visible to PTA. Patient enthusiastic about tx session and is looking forward to walking. PT. Allowed to self-direct direction and distances independently of therapist to increase participation.  Gait: Pt. Ambulated >500' during session with frequent patient identified rest/recovery breaks. Pt. Able to perform 180 turns with therapist assistance to sit on rollator seat. Pt. Requires mod A verbal cues for proper technique and for gait performance (step length and step width). Pt also more distracted during ambulation due to environmental stimuli and required occasional verbal cue to watch where he was going. Therapeutic Activities: Pt. toileting tasks with x3 practice with bathroom navigation due to displayed poor technique and positioning of rollator. Pt. Required min A for toileting needs due to increased tremor in Bilat hands.   Pt. Contacted wife and requested neural stimulator be set at a different level to help decrease hand tremors.   PAIN: no c/o pain during session.  Pt. Seated in w/c with all needs within reach and gait belt donned for safety.   Session 2 (25 min):  Pt. Seated in w/c with visible tremors in R>L hand with wife present. Wife indicates she increased neural simulation level and provided copy  of side-effects of doing so. Copy placed above patient's bed and RN and OT notified. Medical sample collected during therapy.  Therapeutic Activities: Pt. Sit<>stand x 4 for toileting and ambulation; pt. Required steady for donn/doff shorts and briefs. Pt. Required min guard to min A for safety. Pt. Ambulated short distances in room with concentration on turning and obstacle navigation/management. Sit<>supine x 2 for increase sequencing/function (S) to min A for task completion. Pt. Doffed shoes prior to bed mobility and required no additional assistance for shoe mgmt.  Pt. States fatigue is affecting performance, wife concurs due to post meal nap not having occurred.   Pain; No c/o pain during session.  Pt. Supine in bed with HOB elevated and all needs within reach.  Therapy Documentation Precautions:  Precautions Precautions: Fall Restrictions Weight Bearing Restrictions: No  Locomotion : Ambulation Ambulation/Gait Assistance: 4: Min guard;4: Min assist   See FIM for current functional status  Therapy/Group: Individual Therapy  [name] 05/17/2014, 4:06 PM

## 2014-05-17 NOTE — Progress Notes (Addendum)
INITIAL NUTRITION ASSESSMENT  DOCUMENTATION CODES Per approved criteria  -Severe malnutrition in the context of chronic illness or injury   Pt meets criteria for severe MALNUTRITION in the context of chronic illness as evidenced by severe fat and muscle depletion.  INTERVENTION: Ensure Pudding po TID, each supplement provides 170 kcal and 4 grams of protein  NUTRITION DIAGNOSIS: Inadequate oral intake related to dysphagia as evidenced by moderate to severe fat and muscle loss, suspected decline in PO intake due to frequent cueing.   Goal: Pt will meet >90% of estimated nutritional needs  Monitor:  PO/supplement intake, labs, weight changes, I/O's  Reason for Assessment: MST  73 y.o. male  Admitting Dx: <principal problem not specified>  ASSESSMENT: Pt with hx of Parkinson's disease status post deep brain stimulator by Dr. Angelyn Punt, CAD/pacemaker, dementia. S/p tooth extraction on 05/03/14. Admitted for CIR on 05/09/14 for further rehabilitation for Parkinson's disease after hospitalization at [facility] for chills, fever, generalized weakness, and confusion.  Pt reveals that he has lost "some" weight, during this hospitalization, but unable to quantify amount or time frame. He reports his UBW is 165#. Wt hx reveals weight has ranged between 164-170# within the past year.  Pt reports good appetite. Documented meal intake reveals 50-100% of meals. He reports he swallows and tolerates meals well and is delighted that his "swallow test" went well recently. He voiced pleasure in diet advancement; noted diet was upgraded from dysphagia 2 to dysphagia 3 on 05/15/14.  S/p MBSS on 05/15/14 which revealed mild oral dysphagia. Per SLP, pt requires requires frequent cueing, with small bites and sips. Suspect intake may be decreased due to constant cueing.  Labs reviewed; WNL.  Educated pt on importance of good PO intake to improve nutritional status and for continued success in therapy. Pt shared with this RD  that he really enjoys Ensure pudding- RD will order.  Nutrition Focused Physical Exam:  Subcutaneous Fat:  Orbital Region: moderate depletion Upper Arm Region: severe depletion Thoracic and Lumbar Region: moderate depletion  Muscle:  Temple Region: moderate depletion Clavicle Bone Region: severe depletion Clavicle and Acromion Bone Region: moderate depletion Scapular Bone Region: severe depletion Dorsal Hand: moderate depletion Patellar Region: severe depletion Anterior Thigh Region: severe depletion Posterior Calf Region: moderate depletion  Edema: none present  Height: Ht Readings from Last 1 Encounters:  05/09/14 5\' 11"  (1.803 m)    Weight: Wt Readings from Last 1 Encounters:  05/16/14 164 lb (74.39 kg)    Ideal Body Weight: 172#  % Ideal Body Weight: 95%  Wt Readings from Last 10 Encounters:  05/16/14 164 lb (74.39 kg)  05/05/14 172 lb (78.019 kg)  03/06/14 171 lb 4 oz (77.678 kg)  03/01/14 169 lb (76.658 kg)  12/14/13 170 lb 12.8 oz (77.474 kg)  10/24/13 164 lb (74.39 kg)  06/29/13 166 lb (75.297 kg)  02/23/13 173 lb 9.6 oz (78.744 kg)  02/03/13 177 lb 6.4 oz (80.468 kg)  12/14/12 177 lb 9.6 oz (80.559 kg)    Usual Body Weight: 165#  % Usual Body Weight: 99%  BMI:  Body mass index is 22.88 kg/(m^2). Normal weight range  Estimated Nutritional Needs: Kcal: 3762-8315 Protein: 97-112 grams Fluid: 1.9-2.2 L  Skin: rt knee abrasion  Diet Order: Dysphagia 3, gluten free  EDUCATION NEEDS: -Education needs addressed   Intake/Output Summary (Last 24 hours) at 05/17/14 0842 Last data filed at 05/16/14 1800  Gross per 24 hour  Intake    960 ml  Output  0 ml  Net    960 ml    Last BM: 05/16/14  Labs:   Recent Labs Lab 05/16/14 0815  CREATININE 0.77    CBG (last 3)  No results found for this basename: GLUCAP,  in the last 72 hours  Scheduled Meds: . antiseptic oral rinse  15 mL Mouth Rinse BID  . carbidopa-levodopa  1 tablet Oral QHS   . carbidopa-levodopa  2 tablet Oral QID  . dapsone  25 mg Oral BID  . docusate sodium  100 mg Oral Daily  . donepezil  10 mg Oral Daily  . doxycycline  100 mg Oral Q12H  . enoxaparin (LOVENOX) injection  40 mg Subcutaneous Q24H  . entacapone  200 mg Oral QID  . escitalopram  10 mg Oral Daily  . ezetimibe  10 mg Oral Daily  . magnesium oxide  400 mg Oral Daily  . mirabegron ER  50 mg Oral Daily  . QUEtiapine  12.5 mg Oral QHS  . tamsulosin  0.4 mg Oral Daily  . vitamin B-12  1,000 mcg Oral Daily    Continuous Infusions:   Past Medical History  Diagnosis Date  . Parkinson disease   . Bradycardia     syncope due to prolonged pauses, resolved with PPM  . Sleep apnea   . Hypertension   . Dermatitis   . Depression   . Rib fractures 2011    S/p fall  . Maxillary fracture 2009    Right  . BPH (benign prostatic hypertrophy)   . Coronary artery disease 1986    Inferior wall MI treated with Angioplasty  . Normal echocardiogram 2007    Mild LVH with Impaired relaxation, Mild-Mod. Aortic Root dilation, Mild Aortic Sclerosis, Mild MR, Mild TR, Ef-50-55%  . Normal nuclear stress test 2006    Normal EF-54%  . CAD (coronary artery disease)   . Inferior MI   . 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 Maxillary fracture 2009    Right side  . Rib fracture 07/2010  . Atrioventricular block, complete   . Pacemaker   . H/O hiatal hernia     Past Surgical History  Procedure Laterality Date  . Pacemaker insertion  10/28/10    MDT implanted by Dr Ladona Ridgelaylor  . Deep brain stimulator placement      Parkinsons  . Arthroscopic knee      Left knee  . Cataract surgery      Bilateral  . Inguinal hernia repair      Bilateral  . Cardiac catheterization      Multiple Angioplasties, last 2002  . Removal of ganglion cyst      right wrist  . Insertion / placement / revision neurostimulator    . Eye surgery    . Insert / replace / remove pacemaker      Reylene Stauder A. Mayford KnifeWilliams, RD,  LDN Pager: [phone] After hours Pager: [phone]

## 2014-05-17 NOTE — Patient Care Conference (Signed)
Inpatient RehabilitationTeam Conference and Plan of Care Update Date: 05/16/2014   Time: 2:10 PM    Patient Name: [name]      Medical Record Number: [id number]  Date of Birth: 04-19-1941 Sex: Male         Room/Bed: 4W10C/4W10C-01 Payor Info: Payor: MEDICARE / Plan: MEDICARE PART A AND B / Product Type: *No Product type* /    Admitting Diagnosis: Parkinson's  Admit Date/Time:  05/09/2014  3:44 PM Admission Comments: No comment available   Primary Diagnosis:  <principal problem not specified> Principal Problem: <principal problem not specified>  Patient Active Problem List   Diagnosis Date Noted  . Parkinson disease 05/09/2014  . Fever 05/05/2014  . Osteopenia 03/06/2014  . Postsurgical percutaneous transluminal coronary angioplasty status 08/23/2013  . Healed myocardial infarct 08/23/2013  . Obstructive apnea 08/23/2013  . Artificial cardiac pacemaker 08/23/2013  . Unspecified vitamin D deficiency 02/03/2013  . B12 deficiency 02/03/2013  . Dizziness 06/10/2012  . Dermatochalasis of eyelid 03/04/2012  . Error, refractive, myopia 03/04/2012  . Posterior vitreous detachment 03/04/2012  . Pseudoaphakia 03/04/2012  . Atrioventricular block, complete   . Idiopathic Parkinson's disease 08/29/2011  . Hematoma of chest wall 08/26/2011  . Hyperlipidemia 08/22/2011  . CAD (coronary artery disease) 08/22/2011  . Dementia 08/22/2011  . Atrial fibrillation 01/21/2011  . CARDIAC PACEMAKER IN SITU 01/21/2011  . PARKINSON'S DISEASE 11/18/2007  . MYOCARDIAL INFARCTION 11/18/2007  . 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 SLEEP APNEA 11/18/2007    Expected Discharge Date: Expected Discharge Date: 05/24/14  Team Members Present: Physician leading conference: Dr. Faith RogueZachary Swartz Social Worker Present: Amada JupiterLucy Tricia [place], LCSW Nurse Present: Carlean PurlMaryann Barbour, RN PT Present: Hosie SpangleJess Godfrey, Conchita ParisPT;Mandy Hyslop, PT OT Present: Donzetta KohutFrank Barthold, OT;Patricia Mat Carnelay, OT SLP Present: Feliberto Gottronourtney Payne,  SLP Other (Discipline and Name): Ottie GlazierBarbara Boyette, RN [facility]) PPS Coordinator present : Tora DuckMarie Noel, RN, CRRN;Becky Henrene DodgeWindsor, PT     Current Status/Progress Goal Weekly Team Focus  Medical   severe PD. deconditioning, dementia  improve safety, coordination, and exercise tolerance  improve swallowing, safety, reduce tremors   Bowel/Bladder   Continent of bladder and bowel, incont at times, LBM 05/15/14  To be continnet of bladder and bowel  Timed toileting during day   Swallow/Nutrition/ Hydration   mod assist for use of swallowing precautions, upgraded to dys 3 textures per MBS yesterday  supervision with least restrictive diet   Carryover of safe swallowing strategies    ADL's   overall min>mod assist secondary to posterior lean & impulsive behaviors  overall supervision  ADL retraining, dynamic standing, sit<>stands, functional mobility/transfers, overall activity tolerance/endurance, functional strengtening   Mobility   min-mod A due to LOBs and bowel control  supervision/set up  gait with rolator, transfers, bed mobility, ambulating community distance   Communication   mod assist for speech intelligibility   min assist   carryover of compensatory strategies    Safety/Cognition/ Behavioral Observations  mod-max assist   min assist   orientation, memory, basic problem solving, awareness   Pain   NO c/o pain  Pain level <3  Assess and treat for pain q shift   Skin   No skin issues  No skin breakdown  Assess skin q shift    Rehab Goals Patient on target to meet rehab goals: Yes *See Care Plan and progress notes for long and short-term goals.  Barriers to Discharge: severe PD and associated symptoms    Possible Resolutions to Barriers:  adjusting regimen, adaptive equipment, family education  Discharge Planning/Teaching Needs:  home with wife to provide 24/7 assistance (has some private duty assist two days per wk)      Team Discussion:  Severe PD;  Declining in overall  functioning since admit? Had been steady assistance and not concern that can reach min assist goals.  Cognition fluctuates.  SW to follow up with wife to confirm that she can provide min assist  Revisions to Treatment Plan:  None   Continued Need for Acute Rehabilitation Level of Care: The patient requires daily medical management by a physician with specialized training in physical medicine and rehabilitation for the following conditions: Daily direction of a multidisciplinary physical rehabilitation program to ensure safe treatment while eliciting the highest outcome that is of practical value to the patient.: Yes Daily medical management of patient stability for increased activity during participation in an intensive rehabilitation regime.: Yes Daily analysis of laboratory values and/or radiology reports with any subsequent need for medication adjustment of medical intervention for : Neurological problems;Pulmonary problems  Kemaya Dorner 05/17/2014, 10:20 AM

## 2014-05-18 ENCOUNTER — Inpatient Hospital Stay (HOSPITAL_COMMUNITY): Payer: Medicare Other

## 2014-05-18 ENCOUNTER — Encounter (HOSPITAL_COMMUNITY): Payer: Medicare Other | Admitting: Speech Pathology

## 2014-05-18 ENCOUNTER — Encounter (HOSPITAL_COMMUNITY): Payer: Medicare Other | Admitting: Occupational Therapy

## 2014-05-18 LAB — CBC
HCT: 42.5 % (ref 39.0–52.0)
Hemoglobin: 13.5 g/dL (ref 13.0–17.0)
MCH: 28 pg (ref 26.0–34.0)
MCHC: 31.8 g/dL (ref 30.0–36.0)
MCV: 88 fL (ref 78.0–100.0)
PLATELETS: 324 10*3/uL (ref 150–400)
RBC: 4.83 MIL/uL (ref 4.22–5.81)
RDW: 14 % (ref 11.5–15.5)
WBC: 4.4 10*3/uL (ref 4.0–10.5)

## 2014-05-18 LAB — URINE CULTURE
Colony Count: NO GROWTH
Culture: NO GROWTH

## 2014-05-18 LAB — COMPREHENSIVE METABOLIC PANEL
ALK PHOS: 73 U/L (ref 39–117)
ALT: 7 U/L (ref 0–53)
AST: 20 U/L (ref 0–37)
Albumin: 3.5 g/dL (ref 3.5–5.2)
Anion gap: 10 (ref 5–15)
BILIRUBIN TOTAL: 0.4 mg/dL (ref 0.3–1.2)
BUN: 14 mg/dL (ref 6–23)
CO2: 27 mEq/L (ref 19–32)
Calcium: 9 mg/dL (ref 8.4–10.5)
Chloride: 103 mEq/L (ref 96–112)
Creatinine, Ser: 0.76 mg/dL (ref 0.50–1.35)
GFR calc Af Amer: >90 mL/min (ref >90)
GFR calc non Af Amer: 87 mL/min — ABNORMAL LOW (ref >90)
GLUCOSE: 92 mg/dL (ref 70–99)
POTASSIUM: 4.5 meq/L (ref 3.7–5.3)
Sodium: 140 mEq/L (ref 137–147)
Total Protein: 6.6 g/dL (ref 6.0–8.3)

## 2014-05-18 NOTE — Progress Notes (Signed)
Speech Language Pathology Weekly Progress and Session Note  Patient Details  Name: [name] MRN: [id number] Date of Birth: February 07, 1941  Beginning of progress report period: May 11, 2014 End of progress report period: May 18, 2014  Today's Date: 05/18/2014 Time: 1140-1230 Time Calculation (min): 50 min  Short Term Goals: Week 1: SLP Short Term Goal 1 (Week 1): Pt will improve coordination of breath support with articulation for 80% speech intelligibilty in conversations with mod assist  SLP Short Term Goal 1 - Progress (Week 1): Met SLP Short Term Goal 2 (Week 1): Pt will tolerate his currently prescribed diet with no overt s/s of aspiraiton and min cuing for use of swallowing precautions  SLP Short Term Goal 2 - Progress (Week 1): Progressing toward goal SLP Short Term Goal 3 (Week 1): Pt will use external aids to facilitate improved orientaiton to place, date, and situation as well as improved recall of daily events and information for 75% accuracy with mod assist.  SLP Short Term Goal 3 - Progress (Week 1): Met SLP Short Term Goal 4 (Week 1): Pt will identify 2 environmental modifications that he can use to faciltate improved speech intelligibility during conversations with skilled and unskilled communication partners.   SLP Short Term Goal 4 - Progress (Week 1): Progressing toward goal SLP Short Term Goal 5 (Week 1): Pt will sustain attention to basic, familiar tasks for 15-20 minutes with min cuing for redirection.   SLP Short Term Goal 5 - Progress (Week 1): Met SLP Short Term Goal 6 (Week 1): Pt will tolerate trials of upgraded textures with no overt s/s of aspiration.  SLP Short Term Goal 6 - Progress (Week 1): Met    New Short Term Goals: Week 2: SLP Short Term Goal 1 (Week 2): Pt will improve coordination of breath support with articulation for 80% speech intelligibilty in conversations with min assist  SLP Short Term Goal 2 (Week 2): Pt will tolerate his currently  prescribed diet with no overt s/s of aspiraiton and min cuing for use of swallowing precautions  SLP Short Term Goal 3 (Week 2): Pt will use external aids to facilitate improved orientaiton to place, date, and situation as well as improved recall of daily events and information for 75% accuracy with min assist.  SLP Short Term Goal 4 (Week 2): Pt will identify 2 environmental modifications that he can use to faciltate improved speech intelligibility during conversations with skilled and unskilled communication partners.   SLP Short Term Goal 5 (Week 2): Pt will sustain attention to basic, familiar tasks for 30-45 minutes with min cues for redirection.   SLP Short Term Goal 6 (Week 2): Pt will tolerate trials of regular textures with no overt s/s of aspiration.   Weekly Progress Updates:   Patient has made functional gains and has met 4 of 6 short term goals this reporting period due to improved use of swallowing precautions, attention, and orientation.  Currently, patient continues to require mod assist for speech intelligibility including breath support, volume, and topic maintenance as well as for orientation  Pt is tolerating his currently prescribed diet with overall min-mod cuing for use of swallowing precautions to minimize s/s of aspiration.  Patient  and family education is ongoing. Patient would benefit from continued skilled SLP intervention to maximize cognitive function and swallowing safety in order to maximize his functional independence prior to discharge.    Intensity: Minumum of 1-2 x/day, 30 to 90 minutes Frequency: 5 out of 7  days Duration/Length of Stay: 10-14 days Treatment/Interventions: Cognitive remediation/compensation;Dysphagia/aspiration precaution training;Functional tasks;Patient/family education;Oral motor exercises;Internal/external aids   Daily Session  Skilled Therapeutic Interventions: Pt was seen for doubled speech therapy session targeting dysphagia management.   SLP facilitated the session with guided questions related to the pt's swallow precautions with pt able to recall at least 2 with min-mod assist.  SLP completed skilled observations with presentations of pt's currently prescribed diet with pt exhibiting no overt s/s of aspiration and improved initiation and completion of the swallow in a timely manner.  Pt required min-mod assist verbal cues to use swallow precautions during lunch in the presence of moderate environmental distractions.  When engaged in structured conversations related to pt's schedule, pt was unable to recall PM therapies but verbalized use of his daily schedule as an external aid to facilitate temporal awareness.          FIM:  Comprehension Comprehension Mode: Auditory Comprehension: 3-Understands basic 50 - 74% of the time/requires cueing 25 - 50%  of the time Expression Expression Mode: Verbal Expression: 3-Expresses basic 50 - 74% of the time/requires cueing 25 - 50% of the time. Needs to repeat parts of sentences. Social Interaction Social Interaction: 4-Interacts appropriately 75 - 89% of the time - Needs redirection for appropriate language or to initiate interaction. Problem Solving Problem Solving: 2-Solves basic 25 - 49% of the time - needs direction more than half the time to initiate, plan or complete simple activities Memory Memory: 2-Recognizes or recalls 25 - 49% of the time/requires cueing 51 - 75% of the time FIM - Eating Eating Activity: 5: Supervision/cues Pain Pain Assessment Pain Assessment: No/denies pain  Therapy/Group: Individual Therapy  Windell Moulding, M.A. CCC-SLP  Chivonne Rascon, Selinda Orion 05/18/2014, 5:07 PM

## 2014-05-18 NOTE — Progress Notes (Signed)
Physical Therapy Session Note  Patient Details  Name: [name] MRN: [id number] Date of Birth: 1941-10-28  Today's Date: 05/18/2014 Time: 1450-1530 Time Calculation (min): 40 min  Skilled Therapeutic Interventions/Progress Updates:  1:1. Pt missed at start of session due to nursing care. Pt received sitting in w/c, ready for therapy. Focus this session on safety and seq during t/f sit<>stand as well as gait training. Pt amb 175'x2 and 225' w/ weighted RW this session and overall min A. Pt switched to RW for increased safety and stability vs. rollator due to instability and difficulty seq brakes. Strong emphasis on management of RW during turns, req cues 90% of time due to pt being outside the RW and very narrow BOS. Pt demonstrates significantly decreased quality, speed and fluidity of ambulation when attempting to dual task, such as engage in conversation with therapist. Pt able to safely negotiate up/down 8 steps w/ B rail and min A using reciprocal pattern. Strong emphasis on t/f sit<>stand for safety and consistency, utilized anterior wedge to facilitate increased hip flexion during transfer. Overall, pt req min-mod cueing for seq/safety this session vs. Max cues in AM. Pt and wife educated on benefits of RW regarding safety and decreased risk for falls, however, wife continued to state that pt will likely return to use of rollator at home upon d/c. Pt left sitting in w/c at end of session, all needs in reach, quick release belt in place and wife in room.   Therapy Documentation Precautions:  Precautions Precautions: Fall Restrictions Weight Bearing Restrictions: No General: Amount of Missed PT Time (min): 5 Minutes Missed Time Reason: Nursing care  See FIM for current functional status  Therapy/Group: Individual Therapy  Denzil Hughes 05/18/2014, 4:46 PM

## 2014-05-18 NOTE — Progress Notes (Signed)
Physical Therapy Weekly Progress Note  Patient Details  Name: [name] MRN: [id number] Date of Birth: 1941-06-02  Beginning of progress report period: May 10, 2014 End of progress report period: May 18, 2014  Today's Date: 05/18/2014  Patient has made slow progress and has met 1 of 4 short term goals with one goal being discontinued secondary to pt ambulatory on unit and w/c goals not applicable.  Pt functional status can fluctuate due to timing of Parkinson's medication as well as BP issues but is currently overall min A for bed mobility, mod A basic transfers; pt is ambulating 150' but requires mod A for gait in controlled environment with 4WW or weighted RW, and is min A for stairs with 2 rails.  Secondary to pt ambulating greater distances, home w/c goal was D/C but secondary to pt slow progress and continued need for physical assistance pt basic transfers and gait for controlled and home environment have been downgraded to min A with use of 2WW for safety and balance.  Car goal has also been added.  Patient continues to demonstrate the following deficits: impaired cognition, activity tolerance/endurance, impaired trunk and postural control, impaired balance, gait, impaired motor control and coordination and therefore will continue to benefit from skilled PT intervention to enhance overall performance with activity tolerance, balance, postural control, functional use of  right lower extremity and left lower extremity, attention and coordination.  Patient not progressing toward long term goals.  See goal revision..  Plan of care revisions: goals downgraded to supervision-min A overall with w/c goal D/C and car goal added.  PT Short Term Goals Week 1:  PT Short Term Goal 1 (Week 1): Pt will ambulate 29' w/ LRAD w/ MinA PT Short Term Goal 1 - Progress (Week 1): Met PT Short Term Goal 2 (Week 1): Pt will transfers bed to/from w/c w/ LRAD w/ MinA PT Short Term Goal 2 - Progress (Week 1):  Progressing toward goal PT Short Term Goal 3 (Week 1): Pt will propel w/c 50' in controlled environment w/ MinA and min cues PT Short Term Goal 3 - Progress (Week 1): Discontinued (comment) PT Short Term Goal 4 (Week 1): Pt will perform all bed mobility with MinA PT Short Term Goal 4 - Progress (Week 1): Met Week 2:  PT Short Term Goal 1 (Week 2): = LTG of supervision-min A overall   See FIM for current functional status   Raylene Everts [facility] 05/18/2014, 4:41 PM

## 2014-05-18 NOTE — Progress Notes (Signed)
Occupational Therapy Note  Patient Details  Name: [name] MRN: [id number] Date of Birth: Jan 22, 1941 Today's Date: 05/18/2014  Patient missed 60 minutes of skilled OT secondary to patient ill and refused. Prior to entering room, am treating PT informed this OT of patient's decline in functional mobility independence and low blood pressure readings. This OT entered room, finding patient supine in bed asleep. Patient easy to awake and arouse. Patient with increased cognition delays and could barely keep eyes open to have a conversation. BP=110/70. Patient made statement, "I just want to lay here for awhile". Normally, patient is excited to engage in OT ADL sessions and eager to shower. Encouraged patient to get up and engage in therapy, patient continued to refuse or just wouldn't say anything. Notified RN and PA of this.   Shye Doty 05/18/2014, 9:37 AM

## 2014-05-18 NOTE — Plan of Care (Deleted)
Problem: RH COGNITION-NURSING Goal: RH STG USES MEMORY AIDS/STRATEGIES W/ASSIST TO PROBLEM SOLVE STG Uses Memory Aids/Strategies With min Assistance to Problem Solve.  Outcome: Not Progressing Patient lethargic and unable to problem solve. Unable to arouse patient at times. adm

## 2014-05-18 NOTE — Progress Notes (Signed)
Social Work Patient ID: [name], male   DOB: Sep 26, 1941, 73 y.o.   MRN: [id number]  Met yesterday with pt and wife to review team conference.  Both aware and agreeable with targeted d/c date of 7/15 with minimal assist goals.  Wife was pleased with progress to date and denies any concerns about managing his care at home upon d/c.  Will continue to follow for d/c planning needs and support.  Thatiana Renbarger, LCSW

## 2014-05-18 NOTE — Progress Notes (Signed)
Physical Therapy Session Note  Patient Details  Name: [name] MRN: [id number] Date of Birth: Dec 22, 1940  Today's Date: 05/18/2014 Time: 0800-0855 Time Calculation (min): 55 min   Skilled Therapeutic Interventions/Progress Updates:  1:1. Pt received semi-reclined in bed eating breakfast with nurse tech in room, PT taking over. PT providing intermittent cueing for swallowing precautions per SLP recommendation sheet. Focus this session on safety during functional mobility in room and gait training. Pt req (S) for t/f sup>sit EOB and donning shoes. Pt req overall mod A for ambulation in room and hallway w/ rollator (75'x4), toilet transfers, t/f sit<>stand and min A for stair negotiation (up/down 5 steps +B rails) w/ max single step cueing for seq, safety and management of RW. Pt appearing very confused and stated, "I'm not doing well today am I?" Pt assisted back to room to assess vitals w/ RN present, BP found to be low. Pt req min A for t/f sit>sup at end of session. Pt left w/ all needs in reach, bed alarm on and PA-C aware of concerns. Also, discussed concerns with pt's primary PT along with recommendation for more safe AD due to impulsivity and difficulty managing rollator for safety.   Therapy Documentation Precautions:  Precautions Precautions: Fall Restrictions Weight Bearing Restrictions: No Vital Signs: Therapy Vitals Pulse Rate: 72 BP: 98/62 mmHg Patient Position (if appropriate): Sitting  See FIM for current functional status  Therapy/Group: Individual Therapy  Denzil Hughes 05/18/2014, 12:09 PM

## 2014-05-18 NOTE — Progress Notes (Signed)
73 y.o. right-handed male with history significant for Parkinson's disease status post deep brain stimulator by Dr. Angelyn Punt neurosurgery at [facility], CAD/pacemaker, dementia. Patient lives with his wife used a rollator prior to admission and goes to the gym to participate in a Parkinson's program. Admitted 05/05/2014 with chills, fever and generalized weakness with increasing confusion. Reports of recent tooth extraction 2 days prior to admission. Chest x-ray negative. Cranial CT scan with no acute intracranial abnormalities with bilateral deep brain stimulators via high bifrontal Burr holes  Subjective/Complaints: Appears MORE alert this am. Slept well. (therapy reports MORE lethargy during their session an hour after I saw him)  Review of Systems - somewhat limited due to speech/cognitive  Objective: Vital Signs: Blood pressure 98/62, pulse 72, temperature 97.5 F (36.4 C), temperature source Oral, resp. rate 17, height 5\' 11"  (1.803 m), weight 74.39 kg (164 lb), SpO2 92.00%. No results found. Results for orders placed during the hospital encounter of 05/09/14 (from the past 72 hour(s))  CREATININE, SERUM     Status: Abnormal   Collection Time    05/16/14  8:15 AM      Result Value Ref Range   Creatinine, Ser 0.77  0.50 - 1.35 mg/dL   GFR calc non Af Amer 87 (*) >90 mL/min   GFR calc Af Amer >90  >90 mL/min   Comment: (NOTE)     The eGFR has been calculated using the CKD EPI equation.     This calculation has not been validated in all clinical situations.     eGFR's persistently <90 mL/min signify possible Chronic Kidney     Disease.  URINALYSIS, ROUTINE W REFLEX MICROSCOPIC     Status: Abnormal   Collection Time    05/17/14  2:04 PM      Result Value Ref Range   Color, Urine AMBER (*) YELLOW   Comment: BIOCHEMICALS MAY BE AFFECTED BY COLOR   APPearance CLEAR  CLEAR   Specific Gravity, Urine 1.025  1.005 - 1.030   pH 5.0  5.0 - 8.0   Glucose, UA NEGATIVE  NEGATIVE mg/dL   Hgb urine dipstick NEGATIVE  NEGATIVE   Bilirubin Urine NEGATIVE  NEGATIVE   Ketones, ur 15 (*) NEGATIVE mg/dL   Protein, ur NEGATIVE  NEGATIVE mg/dL   Urobilinogen, UA 0.2  0.0 - 1.0 mg/dL   Nitrite NEGATIVE  NEGATIVE   Leukocytes, UA NEGATIVE  NEGATIVE   Comment: MICROSCOPIC NOT DONE ON URINES WITH NEGATIVE PROTEIN, BLOOD, LEUKOCYTES, NITRITE, OR GLUCOSE <1000 mg/dL.      Vitals reviewed.  HENT:  Head: Normocephalic.  Eyes: EOM are normal.  Neck: Normal range of motion. Neck supple. No thyromegaly present.  Cardiovascular: Normal rate and regular rhythm.  Respiratory: Effort normal and breath sounds normal. No respiratory distress.  GI: Soft. Bowel sounds are normal. He exhibits no distension.  Neurological:  More alert. Left hand with more choreatic activity today. 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  motor 5/5 in B UE and BLE   Cerebellar intact  No cogwheeling. Persistent tremors bilateral UE's    Assessment/Plan: 1. Functional deficits secondary to Parkinson's disease which require 3+ hours per day of interdisciplinary therapy in a comprehensive inpatient rehab setting. Physiatrist is providing close team supervision and 24 hour management of active medical problems listed below. Physiatrist and rehab team continue to assess barriers to discharge/monitor patient progress toward functional and  medical goals. FIM: FIM - Bathing Bathing Steps Patient Completed: Chest;Right Arm;Left Arm;Abdomen;Right upper leg;Left upper leg;Front perineal area;Buttocks;Right lower leg (including foot);Left lower leg (including foot) Bathing: 5: Supervision: Safety issues/verbal cues  FIM - Upper Body Dressing/Undressing Upper body dressing/undressing steps patient completed: Thread/unthread right sleeve of pullover shirt/dresss;Put head through opening of pull over shirt/dress;Pull shirt  over trunk;Thread/unthread left sleeve of pullover shirt/dress Upper body dressing/undressing: 5: Supervision: Safety issues/verbal cues FIM - Lower Body Dressing/Undressing Lower body dressing/undressing steps patient completed: Thread/unthread right underwear leg;Thread/unthread left underwear leg;Thread/unthread right pants leg;Thread/unthread left pants leg;Fasten/unfasten right shoe;Don/Doff left shoe;Don/Doff right shoe;Fasten/unfasten left shoe;Fasten/unfasten pants;Pull underwear up/down;Pull pants up/down;Don/Doff right sock;Don/Doff left sock Lower body dressing/undressing: 4: Steadying Assist  FIM - Toileting Toileting steps completed by patient: Adjust clothing prior to toileting;Performs perineal hygiene;Adjust clothing after toileting Toileting Assistive Devices: Grab bar or rail for support Toileting: 4: Steadying assist  FIM - Radio producer Devices: Grab bars;Walker Toilet Transfers: 3-From toilet/BSC: Mod A (lift or lower assist);3-To toilet/BSC: Mod A (lift or lower assist)  FIM - Control and instrumentation engineer Devices: Walker;Arm rests;HOB elevated;Bed rails Bed/Chair Transfer: 5: Supine > Sit: Supervision (verbal cues/safety issues);4: Sit > Supine: Min A (steadying pt. > 75%/lift 1 leg);3: Chair or W/C > Bed: Mod A (lift or lower assist);3: Bed > Chair or W/C: Mod A (lift or lower assist)  FIM - Locomotion: Wheelchair Distance: 250 Locomotion: Wheelchair: 0: Activity did not occur FIM - Locomotion: Ambulation Locomotion: Ambulation Assistive Devices: Walker - Rolling;Orthosis Ambulation/Gait Assistance: 3: Mod assist Locomotion: Ambulation: 3: Travels 150 ft or more with moderate assistance (Pt: 50 - 74%)  Comprehension Comprehension Mode: Auditory Comprehension: 3-Understands basic 50 - 74% of the time/requires cueing 25 - 50%  of the time  Expression Expression Mode: Verbal Expression: 3-Expresses basic 50 - 74% of  the time/requires cueing 25 - 50% of the time. Needs to repeat parts of sentences.  Social Interaction Social Interaction: 4-Interacts appropriately 75 - 89% of the time - Needs redirection for appropriate language or to initiate interaction.  Problem Solving Problem Solving: 2-Solves basic 25 - 49% of the time - needs direction more than half the time to initiate, plan or complete simple activities  Memory Memory: 2-Recognizes or recalls 25 - 49% of the time/requires cueing 51 - 75% of the time   Medical Problem List and Plan:  1. Functional deficits secondary to Parkinson's disease with decline in functional status after fever of unknown etiology. Continue Sinemet as directed for Parkinson disease   -adjusted timing of sinemet  - reduced dose seroquel at HS 2. DVT Prophylaxis/Anticoagulation: Lovenox.  3. Pain Management: Tylenol as needed  4. Mood/dementia: Aricept 10 mg daily, Lexapro 10 mg daily, Seroquel 25 mg each bedtime (dec to 12.5). Bed alarm for safety    5. Neuropsych: This patient is not capable of making decisions on his own behalf.  6. Dysphagia. Dysphagia 3 thin liquid diet after MBS yesterday 7. Recent tooth extraction. Continue doxycycline for now  8. Overactive bladder..Myrbetriq 50 mg daily, Flomax 0.4 mg daily.   -recheck urine once more given fatigue, decline noted by some therapists 9. Hyperlipidemia. Zetia  10. ?functional decline: was more alert for me this am but has been inconsistent  -ua-, culture pending  -will check blood work today as well  -unfortunately Mr. Brodhead has SEVERE PD and will be prone to inconsistencies in appearance and function  LOS (Days) 9 A FACE TO FACE EVALUATION WAS PERFORMED  SWARTZ,ZACHARY T 05/18/2014, 9:27 AM

## 2014-05-18 NOTE — Progress Notes (Signed)
Patient confused and lethargic during 8 am physical therapy session. Blood pressure checked manually 98/62 pulse 72. Assisted patient back to bed with physical therapist. Will continue to monitor patient for any further distress. PA aware. adm

## 2014-05-19 ENCOUNTER — Inpatient Hospital Stay (HOSPITAL_COMMUNITY): Payer: Medicare Other

## 2014-05-19 ENCOUNTER — Inpatient Hospital Stay (HOSPITAL_COMMUNITY): Payer: Medicare Other | Admitting: Physical Therapy

## 2014-05-19 ENCOUNTER — Encounter (HOSPITAL_COMMUNITY): Payer: Medicare Other | Admitting: Occupational Therapy

## 2014-05-19 ENCOUNTER — Ambulatory Visit (HOSPITAL_COMMUNITY): Payer: Medicare Other | Admitting: Speech Pathology

## 2014-05-19 DIAGNOSIS — G20 Parkinson's disease: Secondary | ICD-10-CM

## 2014-05-19 DIAGNOSIS — F0390 Unspecified dementia without behavioral disturbance: Secondary | ICD-10-CM

## 2014-05-19 DIAGNOSIS — I4891 Unspecified atrial fibrillation: Secondary | ICD-10-CM

## 2014-05-19 MED ORDER — ESCITALOPRAM OXALATE 10 MG PO TABS
10.0000 mg | ORAL_TABLET | Freq: Every day | ORAL | Status: DC
  Administered 2014-05-20 – 2014-05-23 (×4): 10 mg via ORAL
  Filled 2014-05-19 (×6): qty 1

## 2014-05-19 NOTE — Progress Notes (Signed)
Speech Language Pathology Daily Session Note  Patient Details  Name: [name] MRN: [id number] Date of Birth: 1941/05/20  Today's Date: 05/19/2014 Time: 0800-0900 Time Calculation (min): 60 min  Short Term Goals: Week 2: SLP Short Term Goal 1 (Week 2): Pt will improve coordination of breath support with articulation for 80% speech intelligibilty in conversations with min assist  SLP Short Term Goal 2 (Week 2): Pt will tolerate his currently prescribed diet with no overt s/s of aspiraiton and min cuing for use of swallowing precautions  SLP Short Term Goal 3 (Week 2): Pt will use external aids to facilitate improved orientaiton to place, date, and situation as well as improved recall of daily events and information for 75% accuracy with min assist.  SLP Short Term Goal 4 (Week 2): Pt will identify 2 environmental modifications that he can use to faciltate improved speech intelligibility during conversations with skilled and unskilled communication partners.   SLP Short Term Goal 5 (Week 2): Pt will sustain attention to basic, familiar tasks for 30-45 minutes with min cues for redirection.   SLP Short Term Goal 6 (Week 2): Pt will tolerate trials of regular textures with no overt s/s of aspiration.   Skilled Therapeutic Interventions:  Pt was seen for skilled speech therapy targeting speech intelligibility, basic problem solving, and dysphagia management.  SLP completed skilled observations with presentations of pt's currently prescribed diet with no overt s/s of aspiration noted with solids or liquids.  Pt managed his swallowing precautions with initial supervision level verbal cues.  SLP facilitated the session with structured tasks targeting carryover of loud voice during functional conversations to improve speech intelligibility.  Pt was able to sustain phonation of vowels at an increased volume for 5 seconds x3 with supervision instructional cuing.  Pt also presented with significantly  improved vocal intensity during counting, automatics, and phrase repetition tasks with min cuing.  SLP also facilitated the session with min cuing for sequencing, organizing, and safety awareness during basic self care tasks.  Continue per current plan of care.   FIM:  Comprehension Comprehension Mode: Auditory Comprehension: 3-Understands basic 50 - 74% of the time/requires cueing 25 - 50%  of the time Expression Expression Mode: Verbal Expression: 3-Expresses basic 50 - 74% of the time/requires cueing 25 - 50% of the time. Needs to repeat parts of sentences. Social Interaction Social Interaction: 4-Interacts appropriately 75 - 89% of the time - Needs redirection for appropriate language or to initiate interaction. Problem Solving Problem Solving: 3-Solves basic 50 - 74% of the time/requires cueing 25 - 49% of the time Memory Memory: 2-Recognizes or recalls 25 - 49% of the time/requires cueing 51 - 75% of the time FIM - Eating Eating Activity: 5: Supervision/cues  Pain Pain Assessment Pain Assessment: No/denies pain  Therapy/Group: Individual Therapy  Jackalyn Lombard, M.A. CCC-SLP  Haileyann Staiger, Melanee Spry 05/19/2014, 12:29 PM

## 2014-05-19 NOTE — Progress Notes (Addendum)
Occupational Therapy Session Note  Patient Details  Name: [name] MRN: [id number] Date of Birth: 1940-12-19  Today's Date: 05/19/2014 Time: 3154-0086 Time Calculation (min): 55 min  Short Term Goals: Week 1:  OT Short Term Goal 1 (Week 1): Patient will perform UB/LB bathing with minimal assistance OT Short Term Goal 1 - Progress (Week 1): Met OT Short Term Goal 2 (Week 1): Patient will don shirt with min assist OT Short Term Goal 2 - Progress (Week 1): Met OT Short Term Goal 3 (Week 1): Patient will perform LB dressing with moderate assistance OT Short Term Goal 3 - Progress (Week 1): Met OT Short Term Goal 4 (Week 1): Patient will be educated on a BUE functional strengthening HEP OT Short Term Goal 4 - Progress (Week 1): Met  Week 2:  OT Short Term Goal 1 (Week 2): Short Term Goals = Long Term Goals secondary to estimated d/c date of 05/24/14  Skilled Therapeutic Interventions/Progress Updates:  Patient received seated in w/c with no complaints of pain. Patient stated he was feeling better today that "my blood pressure was low yesterday". Therapist assisted with w/c mobility and propelled patient into bathroom for toilet transfer, then shower stall transfer. Patient min assist for transfers and dynamic standing balance this morning. Patient completed grooming tasks seated at sink and UB/LB dressing in sit<>stand position. Throughout session, patient required up to moderate multimodal cueing for safety and overall supervision>min for physical assistance. Therapist donned bilateral TEDs and at end of session, left patient seated in w/c with quick release belt donned and all needs within reach.   *At this time, recommending w/c level or seated from rollator level transfers for toileting and showering. Patient continues to present with posterior lean and can require up to moderate assistance for ambulation & transfers when ambulating with rollator. Patient is supervision>min assist with  w/c level transfers. Patient fluctuates with level of assistance needed from day to day and sometimes from session to session.   Precautions:  Precautions Precautions: Fall Restrictions Weight Bearing Restrictions: No  See FIM for current functional status  Therapy/Group: Individual Therapy  [name] 05/19/2014, 10:33 AM

## 2014-05-19 NOTE — Progress Notes (Signed)
Physical Therapy Session Note  Patient Details  Name: [name] MRN: [id number] Date of Birth: 11/19/1940  Today's Date: 05/19/2014 Time: 1032-1118 Time Calculation (min): 46 min  Short Term Goals: Week 1:  PT Short Term Goal 1 (Week 1): Pt will ambulate 38' w/ LRAD w/ MinA PT Short Term Goal 1 - Progress (Week 1): Met PT Short Term Goal 2 (Week 1): Pt will transfers bed to/from w/c w/ LRAD w/ MinA PT Short Term Goal 2 - Progress (Week 1): Progressing toward goal PT Short Term Goal 3 (Week 1): Pt will propel w/c 50' in controlled environment w/ MinA and min cues PT Short Term Goal 3 - Progress (Week 1): Discontinued (comment) PT Short Term Goal 4 (Week 1): Pt will perform all bed mobility with MinA PT Short Term Goal 4 - Progress (Week 1): Met Week 2:  PT Short Term Goal 1 (Week 2): = LTG of supervision-min A overall  Skilled Therapeutic Interventions/Progress Updates:  Pt. Seated in W/C with OT present. Pt. Agreeable to therapy. Pt. Indicates he prefers rollator vs. weighted rolling walker.  Gait: 150' x2 min A with rollator with one seated rest break at half-way point. Pt. Enjoys seeing people come and go in the elevator area; works as a Catering manager. Pt. Difficulty with distractions and requires verbal cues and Min A to maintain focus and direction. Pt. Appears to have increased difficulty focusing on task. 150' x 2 with weighted RW at min A. Pt. Has difficulty with foot placement and seqeuncing; states he's still learning to use it and prefers rollator. Pt. Had one LOB with RW due to increased tone in BUE and lifted walker off floor while turning requiring mod A to restore balance and postural control.  Therapeutic Activities: multiple transfers sit<>Stand  With emphasis on technique and preventing posterior lean when standing. Pt. Demonstrates carry-over for learning, but muscle sequencing for standing is variable and lacks consistency related to fatigue or medication timing.    Pt. Appears to have more difficulty with normally easy tasks; has increased velocity of movements post mild freezing to compensate for timing.  Pt. Reports no c/o pain during session.  Pt. Seated in w/c with all needs within reach in anticipation for eating lunch. Gait belt used for safety.  Therapy Documentation Precautions:  Precautions Precautions: Fall Restrictions Weight Bearing Restrictions: No Pain: Pain Assessment Pain Assessment: No/denies pain  Locomotion : Ambulation Ambulation/Gait Assistance: 4: Min assist   See FIM for current functional status  Therapy/Group: Individual Therapy  Juluis Mire 05/19/2014, 3:55 PM

## 2014-05-19 NOTE — Progress Notes (Signed)
Physical Therapy Session Note  Patient Details  Name: [name] MRN: [id number] Date of Birth: 05-Jun-1941  Today's Date: 05/19/2014 Time: 8381-8403 Time Calculation (min): 35 min  Short Term Goals: Week 2:  PT Short Term Goal 1 (Week 2): = LTG of supervision-min A overall  Skilled Therapeutic Interventions/Progress Updates:   Pt received supine in bed, wife present in room. Stand pivot transfer bed > w/c with S. Pt transported in w/c to [facility] with focus on anterior weightshift with transfers and gait in home environment using weighted RW. Patient performed multiple furniture transfers to low compliant surfaces with and without UE support, overall S-min guard to facilitate anterior weight shift as needed. Pt ambulated using weighted RW throughout carpeted area with overall min A. Pt requires vc's for improved B step length and safety with RW during turns. Pt propelled w/c on carpeted and hard level surfaces x 150 ft using BUEs with S and vc's for increased excursion on w/c rim for energy conservation and improved propulsion. Pt returned to room and left sitting in w/c with QRB on and wife present.   Therapy Documentation Precautions:  Precautions Precautions: Fall Restrictions Weight Bearing Restrictions: No General:   Vital Signs: Therapy Vitals Temp: 97.9 F (36.6 C) Temp src: Oral Pulse Rate: 65 Resp: 18 BP: 139/85 mmHg Patient Position (if appropriate): Lying Oxygen Therapy SpO2: 93 % O2 Device: None (Room air) Pain:  Denied pain Locomotion : Ambulation Ambulation/Gait Assistance: 4: Min assist Wheelchair Mobility Distance: 150   See FIM for current functional status  Therapy/Group: Individual Therapy  Kerney Elbe 05/19/2014, 4:37 PM

## 2014-05-19 NOTE — Progress Notes (Signed)
73 y.o. right-handed male with history significant for Parkinson's disease status post deep brain stimulator by Dr. Salomon Fick neurosurgery at [facility], [place], dementia. Patient lives with his wife used a rollator prior to admission and goes to the gym to participate in a Parkinson's program. Admitted 05/05/2014 with chills, fever and generalized weakness with increasing confusion. Reports of recent tooth extraction 2 days prior to admission. Chest x-ray negative. Cranial CT scan with no acute intracranial abnormalities with bilateral deep brain stimulators via high bifrontal Burr holes  Subjective/Complaints: Appears MORE alert again this am. Slept well.  Fatigues easily with therapies. Review of Systems - somewhat limited due to speech/cognitive  Objective: Vital Signs: Blood pressure 121/71, pulse 72, temperature 97.9 F (36.6 C), temperature source Oral, resp. rate 16, height $RemoveBe'5\' 11"'VNjeWYtCO$  (1.803 m), weight 74.39 kg (164 lb), SpO2 97.00%. No results found. Results for orders placed during the hospital encounter of 05/09/14 (from the past 72 hour(s))  URINE CULTURE     Status: None   Collection Time    05/17/14  2:04 PM      Result Value Ref Range   Specimen Description URINE, CLEAN CATCH     Special Requests NONE     Culture  Setup Time       Value: 05/17/2014 14:25     Performed at [place]       Value: NO GROWTH     Performed at [facility]   Culture       Value: NO GROWTH     Performed at [facility]   Report Status 05/18/2014 FINAL    URINALYSIS, ROUTINE W REFLEX MICROSCOPIC     Status: Abnormal   Collection Time    05/17/14  2:04 PM      Result Value Ref Range   Color, Urine AMBER (*) YELLOW   Comment: BIOCHEMICALS MAY BE AFFECTED BY COLOR   APPearance CLEAR  CLEAR   Specific Gravity, Urine 1.025  1.005 - 1.030   pH 5.0  5.0 - 8.0   Glucose, UA NEGATIVE  NEGATIVE mg/dL   Hgb urine dipstick NEGATIVE  NEGATIVE   Bilirubin  Urine NEGATIVE  NEGATIVE   Ketones, ur 15 (*) NEGATIVE mg/dL   Protein, ur NEGATIVE  NEGATIVE mg/dL   Urobilinogen, UA 0.2  0.0 - 1.0 mg/dL   Nitrite NEGATIVE  NEGATIVE   Leukocytes, UA NEGATIVE  NEGATIVE   Comment: MICROSCOPIC NOT DONE ON URINES WITH NEGATIVE PROTEIN, BLOOD, LEUKOCYTES, NITRITE, OR GLUCOSE <1000 mg/dL.  COMPREHENSIVE METABOLIC PANEL     Status: Abnormal   Collection Time    05/18/14 10:30 AM      Result Value Ref Range   Sodium 140  137 - 147 mEq/L   Potassium 4.5  3.7 - 5.3 mEq/L   Chloride 103  96 - 112 mEq/L   CO2 27  19 - 32 mEq/L   Glucose, Bld 92  70 - 99 mg/dL   BUN 14  6 - 23 mg/dL   Creatinine, Ser 0.76  0.50 - 1.35 mg/dL   Calcium 9.0  8.4 - 10.5 mg/dL   Total Protein 6.6  6.0 - 8.3 g/dL   Albumin 3.5  3.5 - 5.2 g/dL   AST 20  0 - 37 U/L   ALT 7  0 - 53 U/L   Alkaline Phosphatase 73  39 - 117 U/L   Total Bilirubin 0.4  0.3 - 1.2 mg/dL   GFR calc non  Af Amer 87 (*) >90 mL/min   GFR calc Af Amer >90  >90 mL/min   Comment: (NOTE)     The eGFR has been calculated using the CKD EPI equation.     This calculation has not been validated in all clinical situations.     eGFR's persistently <90 mL/min signify possible Chronic Kidney     Disease.   Anion gap 10  5 - 15  CBC     Status: None   Collection Time    05/18/14 10:30 AM      Result Value Ref Range   WBC 4.4  4.0 - 10.5 K/uL   RBC 4.83  4.22 - 5.81 MIL/uL   Hemoglobin 13.5  13.0 - 17.0 g/dL   HCT 26.3  33.5 - 45.6 %   MCV 88.0  78.0 - 100.0 fL   MCH 28.0  26.0 - 34.0 pg   MCHC 31.8  30.0 - 36.0 g/dL   RDW 25.6  38.9 - 37.3 %   Platelets 324  150 - 400 K/uL      Vitals reviewed.  HENT:  Head: Normocephalic.  Eyes: EOM are normal.  Neck: Normal range of motion. Neck supple. No thyromegaly present.  Cardiovascular: Normal rate and regular rhythm.  Respiratory: Effort normal and breath sounds normal. No respiratory distress.  GI: Soft. Bowel sounds are normal. He exhibits no distension.   Neurological:  More alert. Left hand with more choreatic activity today. 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  motor 5/5 in B UE and BLE   Cerebellar intact  No cogwheeling. Persistent tremors bilateral UE's    Assessment/Plan: 1. Functional deficits secondary to Parkinson's disease which require 3+ hours per day of interdisciplinary therapy in a comprehensive inpatient rehab setting. Physiatrist is providing close team supervision and 24 hour management of active medical problems listed below. Physiatrist and rehab team continue to assess barriers to discharge/monitor patient progress toward functional and medical goals. FIM: FIM - Bathing Bathing Steps Patient Completed: Chest;Right Arm;Left Arm;Abdomen;Right upper leg;Left upper leg;Front perineal area;Buttocks;Right lower leg (including foot);Left lower leg (including foot) Bathing: 5: Supervision: Safety issues/verbal cues  FIM - Upper Body Dressing/Undressing Upper body dressing/undressing steps patient completed: Thread/unthread right sleeve of pullover shirt/dresss;Put head through opening of pull over shirt/dress;Pull shirt over trunk;Thread/unthread left sleeve of pullover shirt/dress Upper body dressing/undressing: 5: Supervision: Safety issues/verbal cues FIM - Lower Body Dressing/Undressing Lower body dressing/undressing steps patient completed: Thread/unthread right underwear leg;Thread/unthread left underwear leg;Thread/unthread right pants leg;Thread/unthread left pants leg;Fasten/unfasten right shoe;Don/Doff left shoe;Don/Doff right shoe;Fasten/unfasten left shoe;Fasten/unfasten pants;Pull underwear up/down;Pull pants up/down;Don/Doff right sock;Don/Doff left sock Lower body dressing/undressing: 4: Steadying Assist  FIM - Toileting Toileting steps completed by patient: Adjust clothing prior to toileting;Performs  perineal hygiene;Adjust clothing after toileting Toileting Assistive Devices: Grab bar or rail for support Toileting: 4: Steadying assist  FIM - Diplomatic Services operational officer Devices: Grab bars;Walker Toilet Transfers: 3-From toilet/BSC: Mod A (lift or lower assist);3-To toilet/BSC: Mod A (lift or lower assist)  FIM - Banker Devices: Walker;Arm rests;HOB elevated;Bed rails Bed/Chair Transfer: 5: Supine > Sit: Supervision (verbal cues/safety issues);4: Sit > Supine: Min A (steadying pt. > 75%/lift 1 leg);3: Chair or W/C > Bed: Mod A (lift or lower assist);3: Bed > Chair or W/C: Mod A (lift or lower assist)  FIM - Locomotion: Wheelchair Distance: 250 Locomotion: Wheelchair: 0:  Activity did not occur FIM - Locomotion: Ambulation Locomotion: Ambulation Assistive Devices: Walker - Rolling;Orthosis Ambulation/Gait Assistance: 3: Mod assist Locomotion: Ambulation: 2: Travels 50 - 149 ft with moderate assistance (Pt: 50 - 74%)  Comprehension Comprehension Mode: Auditory Comprehension: 3-Understands basic 50 - 74% of the time/requires cueing 25 - 50%  of the time  Expression Expression Mode: Verbal Expression: 3-Expresses basic 50 - 74% of the time/requires cueing 25 - 50% of the time. Needs to repeat parts of sentences.  Social Interaction Social Interaction: 4-Interacts appropriately 75 - 89% of the time - Needs redirection for appropriate language or to initiate interaction.  Problem Solving Problem Solving: 2-Solves basic 25 - 49% of the time - needs direction more than half the time to initiate, plan or complete simple activities  Memory Memory: 2-Recognizes or recalls 25 - 49% of the time/requires cueing 51 - 75% of the time   Medical Problem List and Plan:  1. Functional deficits secondary to Parkinson's disease with decline in functional status after fever of unknown etiology. Continue Sinemet as directed for Parkinson  disease   -adjusted timing of sinemet  - reduced dose seroquel at HS---will dc entirely 2. DVT Prophylaxis/Anticoagulation: Lovenox.  3. Pain Management: Tylenol as needed  4. Mood/dementia: Aricept 10 mg daily, Lexapro 10 mg daily, Seroquel 25 mg each bedtime (dec to 12.5). Bed alarm for safety    5. Neuropsych: This patient is not capable of making decisions on his own behalf.  6. Dysphagia. Dysphagia 3 thin liquid diet after MBS yesterday 7. Recent tooth extraction. Continue doxycycline for now  8. Overactive bladder..Myrbetriq 50 mg daily, Flomax 0.4 mg daily.    9. Hyperlipidemia. Zetia  10. ?functional decline: was more alert for me this am but has been inconsistent  -ua-, culture again negative  -all bloodwork within normal limits  -unfortunately Mr. Sroka has SEVERE PD and will be prone to inconsistencies in appearance and function  LOS (Days) 10 A FACE TO FACE EVALUATION WAS PERFORMED  SWARTZ,ZACHARY T 05/19/2014, 9:23 AM

## 2014-05-20 ENCOUNTER — Inpatient Hospital Stay (HOSPITAL_COMMUNITY): Payer: Medicare Other | Admitting: *Deleted

## 2014-05-20 ENCOUNTER — Inpatient Hospital Stay (HOSPITAL_COMMUNITY): Payer: Medicare Other | Admitting: Speech Pathology

## 2014-05-20 NOTE — Progress Notes (Signed)
73 y.o. right-handed male with history significant for Parkinson's disease status post deep brain stimulator by Dr. Salomon Fick neurosurgery at [facility], [place], dementia. Patient lives with his wife used a rollator prior to admission and goes to the gym to participate in a Parkinson's program. Admitted 05/05/2014 with chills, fever and generalized weakness with increasing confusion. Reports of recent tooth extraction 2 days prior to admission. Chest x-ray negative. Cranial CT scan with no acute intracranial abnormalities with bilateral deep brain stimulators via high bifrontal Burr holes  Subjective/Complaints: No new issues. Denies pain. Slept ok Review of Systems - somewhat limited due to speech/cognitive  Objective: Vital Signs: Blood pressure 125/75, pulse 60, temperature 97.6 F (36.4 C), temperature source Oral, resp. rate 17, height $RemoveBe'5\' 11"'sXceixbHf$  (1.803 m), weight 74.39 kg (164 lb), SpO2 95.00%. No results found. Results for orders placed during the hospital encounter of 05/09/14 (from the past 72 hour(s))  URINE CULTURE     Status: None   Collection Time    05/17/14  2:04 PM      Result Value Ref Range   Specimen Description URINE, CLEAN CATCH     Special Requests NONE     Culture  Setup Time       Value: 05/17/2014 14:25     Performed at [place]       Value: NO GROWTH     Performed at [facility]   Culture       Value: NO GROWTH     Performed at [facility]   Report Status 05/18/2014 FINAL    URINALYSIS, ROUTINE W REFLEX MICROSCOPIC     Status: Abnormal   Collection Time    05/17/14  2:04 PM      Result Value Ref Range   Color, Urine AMBER (*) YELLOW   Comment: BIOCHEMICALS MAY BE AFFECTED BY COLOR   APPearance CLEAR  CLEAR   Specific Gravity, Urine 1.025  1.005 - 1.030   pH 5.0  5.0 - 8.0   Glucose, UA NEGATIVE  NEGATIVE mg/dL   Hgb urine dipstick NEGATIVE  NEGATIVE   Bilirubin Urine NEGATIVE  NEGATIVE   Ketones, ur  15 (*) NEGATIVE mg/dL   Protein, ur NEGATIVE  NEGATIVE mg/dL   Urobilinogen, UA 0.2  0.0 - 1.0 mg/dL   Nitrite NEGATIVE  NEGATIVE   Leukocytes, UA NEGATIVE  NEGATIVE   Comment: MICROSCOPIC NOT DONE ON URINES WITH NEGATIVE PROTEIN, BLOOD, LEUKOCYTES, NITRITE, OR GLUCOSE <1000 mg/dL.  COMPREHENSIVE METABOLIC PANEL     Status: Abnormal   Collection Time    05/18/14 10:30 AM      Result Value Ref Range   Sodium 140  137 - 147 mEq/L   Potassium 4.5  3.7 - 5.3 mEq/L   Chloride 103  96 - 112 mEq/L   CO2 27  19 - 32 mEq/L   Glucose, Bld 92  70 - 99 mg/dL   BUN 14  6 - 23 mg/dL   Creatinine, Ser 0.76  0.50 - 1.35 mg/dL   Calcium 9.0  8.4 - 10.5 mg/dL   Total Protein 6.6  6.0 - 8.3 g/dL   Albumin 3.5  3.5 - 5.2 g/dL   AST 20  0 - 37 U/L   ALT 7  0 - 53 U/L   Alkaline Phosphatase 73  39 - 117 U/L   Total Bilirubin 0.4  0.3 - 1.2 mg/dL   GFR calc non Af Amer 87 (*) >90 mL/min  GFR calc Af Amer >90  >90 mL/min   Comment: (NOTE)     The eGFR has been calculated using the CKD EPI equation.     This calculation has not been validated in all clinical situations.     eGFR's persistently <90 mL/min signify possible Chronic Kidney     Disease.   Anion gap 10  5 - 15  CBC     Status: None   Collection Time    05/18/14 10:30 AM      Result Value Ref Range   WBC 4.4  4.0 - 10.5 K/uL   RBC 4.83  4.22 - 5.81 MIL/uL   Hemoglobin 13.5  13.0 - 17.0 g/dL   HCT 42.5  39.0 - 52.0 %   MCV 88.0  78.0 - 100.0 fL   MCH 28.0  26.0 - 34.0 pg   MCHC 31.8  30.0 - 36.0 g/dL   RDW 14.0  11.5 - 15.5 %   Platelets 324  150 - 400 K/uL      Vitals reviewed.  HENT:  Head: Normocephalic.  Eyes: EOM are normal.  Neck: Normal range of motion. Neck supple. No thyromegaly present.  Cardiovascular: Normal rate and regular rhythm.  Respiratory: Effort normal and breath sounds normal. No respiratory distress.  GI: Soft. Bowel sounds are normal. He exhibits no distension.  Neurological:  More alert. Left hand  only slight choreatic activity today. 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  motor 5/5 in B UE and BLE   Cerebellar intact  No cogwheeling. Persistent tremors bilateral UE's    Assessment/Plan: 1. Functional deficits secondary to Parkinson's disease which require 3+ hours per day of interdisciplinary therapy in a comprehensive inpatient rehab setting. Physiatrist is providing close team supervision and 24 hour management of active medical problems listed below. Physiatrist and rehab team continue to assess barriers to discharge/monitor patient progress toward functional and medical goals. FIM: FIM - Bathing Bathing Steps Patient Completed: Chest;Right Arm;Left Arm;Abdomen;Right upper leg;Left upper leg;Front perineal area;Buttocks;Right lower leg (including foot);Left lower leg (including foot) Bathing: 5: Supervision: Safety issues/verbal cues (sit<>stand position)  FIM - Upper Body Dressing/Undressing Upper body dressing/undressing steps patient completed: Thread/unthread right sleeve of pullover shirt/dresss;Put head through opening of pull over shirt/dress;Pull shirt over trunk;Thread/unthread left sleeve of pullover shirt/dress Upper body dressing/undressing: 5: Supervision: Safety issues/verbal cues FIM - Lower Body Dressing/Undressing Lower body dressing/undressing steps patient completed: Thread/unthread right underwear leg;Thread/unthread left underwear leg;Thread/unthread right pants leg;Thread/unthread left pants leg;Fasten/unfasten right shoe;Don/Doff left shoe;Don/Doff right shoe;Fasten/unfasten left shoe;Fasten/unfasten pants;Pull underwear up/down;Pull pants up/down;Don/Doff right sock;Don/Doff left sock Lower body dressing/undressing: 4: Steadying Assist  FIM - Toileting Toileting steps completed by patient: Adjust clothing prior to toileting;Performs perineal  hygiene;Adjust clothing after toileting Toileting Assistive Devices: Grab bar or rail for support Toileting: 4: Steadying assist  FIM - Radio producer Devices: Grab bars;Walker Toilet Transfers: 4-To toilet/BSC: Min A (steadying Pt. > 75%);4-From toilet/BSC: Min A (steadying Pt. > 75%)  FIM - Control and instrumentation engineer Devices: Walker;Arm rests;HOB elevated;Bed rails Bed/Chair Transfer: 5: Supine > Sit: Supervision (verbal cues/safety issues);5: Bed > Chair or W/C: Supervision (verbal cues/safety issues)  FIM - Locomotion: Wheelchair Distance: 150 Locomotion: Wheelchair: 5: Travels 150 ft or more: maneuvers on rugs and over door sills with supervision, cueing or coaxing FIM - Locomotion: Ambulation Locomotion: Ambulation Assistive Devices: Administrator Ambulation/Gait Assistance:  4: Min assist Locomotion: Ambulation: 2: Travels 50 - 149 ft with minimal assistance (Pt.>75%)  Comprehension Comprehension Mode: Auditory Comprehension: 3-Understands basic 50 - 74% of the time/requires cueing 25 - 50%  of the time  Expression Expression Mode: Verbal Expression: 3-Expresses basic 50 - 74% of the time/requires cueing 25 - 50% of the time. Needs to repeat parts of sentences.  Social Interaction Social Interaction: 4-Interacts appropriately 75 - 89% of the time - Needs redirection for appropriate language or to initiate interaction.  Problem Solving Problem Solving: 3-Solves basic 50 - 74% of the time/requires cueing 25 - 49% of the time  Memory Memory: 2-Recognizes or recalls 25 - 49% of the time/requires cueing 51 - 75% of the time   Medical Problem List and Plan:  1. Functional deficits secondary to Parkinson's disease with decline in functional status after fever of unknown etiology. Continue Sinemet as directed for Parkinson disease   -adjusted timing of sinemet  -HS seroquel stopped--appears more alert to me.  -pt with severe  PD syndrome 2. DVT Prophylaxis/Anticoagulation: Lovenox.  3. Pain Management: Tylenol as needed  4. Mood/dementia: Aricept 10 mg daily, Lexapro 10 mg daily, Seroquel stopped. Bed alarm for safety    5. Neuropsych: This patient is not capable of making decisions on his own behalf.  6. Dysphagia. Dysphagia 3 thin liquid diet after MBS yesterday 7. Recent tooth extraction. Continue doxycycline for now  8. Overactive bladder..Myrbetriq 50 mg daily, Flomax 0.4 mg daily.    9. Hyperlipidemia. Zetia  1 LOS (Days) 11 A FACE TO FACE EVALUATION WAS PERFORMED  Feliz Lincoln T 05/20/2014, 8:25 AM

## 2014-05-20 NOTE — Progress Notes (Signed)
Occupational Therapy Session Note  Patient Details  Name: [name] MRN: [id number] Date of Birth: 1941-03-20  Today's Date: 05/20/2014 Time: 1545-1645   (60) Time Calculation (min): 60 min  Short Term Goals: Week 1:  OT Short Term Goal 1 (Week 1): Patient will perform UB/LB bathing with minimal assistance OT Short Term Goal 1 - Progress (Week 1): Met OT Short Term Goal 2 (Week 1): Patient will don shirt with min assist OT Short Term Goal 2 - Progress (Week 1): Met OT Short Term Goal 3 (Week 1): Patient will perform LB dressing with moderate assistance OT Short Term Goal 3 - Progress (Week 1): Met OT Short Term Goal 4 (Week 1): Patient will be educated on a BUE functional strengthening HEP OT Short Term Goal 4 - Progress (Week 1): Met  Skilled Therapeutic Interventions/Progress Updates:    Pt. Sitting in wc upon OT arrival.  . Address wc mobility, transfers, sit to stand, balance, strengthening.  Pt propelled wc down to gym. Ambulated with RW with mod assist and cues for balance and postural control.  Pt. Demonstrated ataxic LE with crossing legs and postural control.  Performed balance exercises on mat in qua draped positions.   Pt was incontinent of bowel.  Provided cues for pt to slow down and go slowly back to wc so we could go back to room to get cleaned up.  Pt. Need min assist to do this.  Transferred to toilet and pt removed soiled pad.  Transferred back to wc and ambulated about 4 feet to sink to wash hands.  Provided cues to stabilize pelvis next to sink while washing hands.  Ambulated from room to gym and back to room with mod assist.  Did picking up objects off floor cues for positioning and balance.  Practiced turning and stopping when walking.  Did squats x10.  Left pt in room with wife and safety belt on.    Therapy Documentation Precautions:  Precautions Precautions: Fall Restrictions Weight Bearing Restrictions: No       Pain: Pain Assessment Pain Assessment:  No/denies pain Pain Score: 0-No pain    See FIM for current functional status  Therapy/Group: Individual Therapy  Lisa Roca 05/20/2014, 6:13 PM

## 2014-05-20 NOTE — Progress Notes (Signed)
Speech Language Pathology Daily Session Note  Patient Details  Name: [name] MRN: [id number] Date of Birth: 1940/12/21  Today's Date: 05/20/2014 Time: 1430-1500 Time Calculation (min): 30 min  Short Term Goals: Week 1: SLP Short Term Goal 1 (Week 1): Pt will improve coordination of breath support with articulation for 80% speech intelligibilty in conversations with mod assist  SLP Short Term Goal 1 - Progress (Week 1): Met SLP Short Term Goal 2 (Week 1): Pt will tolerate his currently prescribed diet with no overt s/s of aspiraiton and min cuing for use of swallowing precautions  SLP Short Term Goal 2 - Progress (Week 1): Progressing toward goal SLP Short Term Goal 3 (Week 1): Pt will use external aids to facilitate improved orientaiton to place, date, and situation as well as improved recall of daily events and information for 75% accuracy with mod assist.  SLP Short Term Goal 3 - Progress (Week 1): Met SLP Short Term Goal 4 (Week 1): Pt will identify 2 environmental modifications that he can use to faciltate improved speech intelligibility during conversations with skilled and unskilled communication partners.   SLP Short Term Goal 4 - Progress (Week 1): Progressing toward goal SLP Short Term Goal 5 (Week 1): Pt will sustain attention to basic, familiar tasks for 15-20 minutes with min cuing for redirection.   SLP Short Term Goal 5 - Progress (Week 1): Met SLP Short Term Goal 6 (Week 1): Pt will tolerate trials of upgraded textures with no overt s/s of aspiration.  SLP Short Term Goal 6 - Progress (Week 1): Met  Skilled Therapeutic Interventions: Skilled treatment session focused on speech/voice goals with SLP providing direct instruction, demonstration, education of diaphragmatic breathing technique and phonation exercises for maximizing vocal intensity. Patient's spouse present and she stated that patient has previously been through voice treatment but that he hasn't kept up with  his exercises. Patient return-demonstrated approximate diaphragmatic breathing technique sitting upright in chair. Clinician demonstrated and cued patient to coordinate respiration with sustained phonation at vowel, word, and 3-word phrase level with cues to speak "as loud as you can...as if you are yelling". Patient able to achieve adequate speaking vocal intensity however had difficulty in controlling and coordinating his exhalation of breath with phonation. Continue with plan of care   FIM:  Comprehension Comprehension Mode: Auditory Comprehension: 3-Understands basic 50 - 74% of the time/requires cueing 25 - 50%  of the time Expression Expression Mode: Verbal Expression: 3-Expresses basic 50 - 74% of the time/requires cueing 25 - 50% of the time. Needs to repeat parts of sentences. Social Interaction Social Interaction: 4-Interacts appropriately 75 - 89% of the time - Needs redirection for appropriate language or to initiate interaction. Problem Solving Problem Solving: 3-Solves basic 50 - 74% of the time/requires cueing 25 - 49% of the time Memory Memory: 2-Recognizes or recalls 25 - 49% of the time/requires cueing 51 - 75% of the time  Pain Pain Assessment Pain Assessment: No/denies pain Pain Score: 0-No pain  Therapy/Group: Individual Therapy  Dannial Monarch 05/20/2014, 5:04 PM  Sonia Baller, MA, CCC-SLP [facility] Speech-Language Pathologist

## 2014-05-21 ENCOUNTER — Inpatient Hospital Stay (HOSPITAL_COMMUNITY): Payer: Medicare Other | Admitting: *Deleted

## 2014-05-21 LAB — GLUCOSE, CAPILLARY: GLUCOSE-CAPILLARY: 111 mg/dL — AB (ref 70–99)

## 2014-05-21 NOTE — Progress Notes (Signed)
Occupational Therapy Session Note  Patient Details  Name: [name] MRN: [id number] Date of Birth: 05-18-1941  Today's Date: 05/21/2014 Time: 5248-1859 Time Calculation (min): 60 min  Short Term Goals: Week 1:  OT Short Term Goal 1 (Week 1): Patient will perform UB/LB bathing with minimal assistance OT Short Term Goal 1 - Progress (Week 1): Met OT Short Term Goal 2 (Week 1): Patient will don shirt with min assist OT Short Term Goal 2 - Progress (Week 1): Met OT Short Term Goal 3 (Week 1): Patient will perform LB dressing with moderate assistance OT Short Term Goal 3 - Progress (Week 1): Met OT Short Term Goal 4 (Week 1): Patient will be educated on a BUE functional strengthening HEP OT Short Term Goal 4 - Progress (Week 1): Met Week 2:  OT Short Term Goal 1 (Week 2): Short Term Goals = Long Term Goals secondary to estimated d/c date of 05/24/14  Skilled Therapeutic Interventions/Progress Updates:    Pt. Sitting in wc with wife and visitor.  Wife reported his Parkinson meds were due in 15 minutes and that his tremors were real bad.  Pt agreed to go to gym.  Focus on wc mobility, transfers, sit to stand, balance.  Pt. Propelled wc very fast and provided cues to slow down.  Transferred wc to mat with minimal assist.  Performed balance activities in qua draped and half kneeling.  Practiced side lying and prone on elbows  Did static trunk holding using PNF patterns in standing.  Used nustep for 10 min.at 4 wkload.    Pt c/o of back and arm pain during session.  He thought he was in the gym that he uses when he works out at home.  Pt. Confused and fatigued.   Was difficult to get a clear answer from him and hard to understand.  Used 2# weights for some UE and then reverted to no weights when pt c/o of arm pain.  Pt propelled self back to room with OT pushing him 50 % of way.   Discussed with wife all the above complaints.  She and NT reported pt had had a lot of visitors and had taken a shower  with the NT at [place] administered meds.  Left pt  In room with wife and NT and set up for dinner tray.     Therapy Documentation Precautions:  Precautions Precautions: Fall Restrictions Weight Bearing Restrictions: No    Pain:    4/10 low back and arms           See FIM for current functional status  Therapy/Group: Individual Therapy  Lisa Roca 05/21/2014, 5:34 PM

## 2014-05-21 NOTE — Progress Notes (Signed)
73 y.o. right-handed male with history significant for Parkinson's disease status post deep brain stimulator by Dr. Angelyn Punt neurosurgery at [facility], CAD/pacemaker, dementia. Patient lives with his wife used a rollator prior to admission and goes to the gym to participate in a Parkinson's program. Admitted 05/05/2014 with chills, fever and generalized weakness with increasing confusion. Reports of recent tooth extraction 2 days prior to admission. Chest x-ray negative. Cranial CT scan with no acute intracranial abnormalities with bilateral deep brain stimulators via high bifrontal Burr holes  Subjective/Complaints: No new complaints. Denies pain. Slept well again. Review of Systems - somewhat limited due to speech/cognitive  Objective: Vital Signs: Blood pressure 141/84, pulse 60, temperature 97.5 F (36.4 C), temperature source Oral, resp. rate 18, height 5\' 11"  (1.803 m), weight 74.39 kg (164 lb), SpO2 94.00%. No results found. Results for orders placed during the hospital encounter of 05/09/14 (from the past 72 hour(s))  COMPREHENSIVE METABOLIC PANEL     Status: Abnormal   Collection Time    05/18/14 10:30 AM      Result Value Ref Range   Sodium 140  137 - 147 mEq/L   Potassium 4.5  3.7 - 5.3 mEq/L   Chloride 103  96 - 112 mEq/L   CO2 27  19 - 32 mEq/L   Glucose, Bld 92  70 - 99 mg/dL   BUN 14  6 - 23 mg/dL   Creatinine, Ser 07/19/14  0.50 - 1.35 mg/dL   Calcium 9.0  8.4 - 8.29 mg/dL   Total Protein 6.6  6.0 - 8.3 g/dL   Albumin 3.5  3.5 - 5.2 g/dL   AST 20  0 - 37 U/L   ALT 7  0 - 53 U/L   Alkaline Phosphatase 73  39 - 117 U/L   Total Bilirubin 0.4  0.3 - 1.2 mg/dL   GFR calc non Af Amer 87 (*) >90 mL/min   GFR calc Af Amer >90  >90 mL/min   Comment: (NOTE)     The eGFR has been calculated using the CKD EPI equation.     This calculation has not been validated in all clinical situations.     eGFR's persistently <90 mL/min signify possible Chronic Kidney     Disease.   Anion gap 10  5 - 15  CBC     Status: None   Collection Time    05/18/14 10:30 AM      Result Value Ref Range   WBC 4.4  4.0 - 10.5 K/uL   RBC 4.83  4.22 - 5.81 MIL/uL   Hemoglobin 13.5  13.0 - 17.0 g/dL   HCT 07/19/14  00.4 - 76.7 %   MCV 88.0  78.0 - 100.0 fL   MCH 28.0  26.0 - 34.0 pg   MCHC 31.8  30.0 - 36.0 g/dL   RDW 37.8  45.3 - 06.3 %   Platelets 324  150 - 400 K/uL      Vitals reviewed.  HENT:  Head: Normocephalic.  Eyes: EOM are normal.  Neck: Normal range of motion. Neck supple. No thyromegaly present.  Cardiovascular: Normal rate and regular rhythm.  Respiratory: Effort normal and breath sounds normal. No respiratory distress.  GI: Soft. Bowel sounds are normal. He exhibits no distension.  Neurological:  More alert. Left hand with continued choreatic activity today. Speech hypophonic/dysarthria but better today.  He was able to carry on a short conversation today.    Skin: Skin is warm and dry.  motor  5/5 in B UE and BLE   Cerebellar intact  No cogwheeling. Persistent tremors bilateral UE's    Assessment/Plan: 1. Functional deficits secondary to Parkinson's disease which require 3+ hours per day of interdisciplinary therapy in a comprehensive inpatient rehab setting. Physiatrist is providing close team supervision and 24 hour management of active medical problems listed below. Physiatrist and rehab team continue to assess barriers to discharge/monitor patient progress toward functional and medical goals. FIM: FIM - Bathing Bathing Steps Patient Completed: Chest;Right Arm;Left Arm;Abdomen;Right upper leg;Left upper leg;Front perineal area;Buttocks;Right lower leg (including foot);Left lower leg (including foot) Bathing: 5: Supervision: Safety issues/verbal cues (sit<>stand position)  FIM - Upper Body Dressing/Undressing Upper body dressing/undressing steps patient completed: Thread/unthread right sleeve of pullover shirt/dresss;Put head through opening of pull over  shirt/dress;Pull shirt over trunk;Thread/unthread left sleeve of pullover shirt/dress Upper body dressing/undressing: 5: Supervision: Safety issues/verbal cues FIM - Lower Body Dressing/Undressing Lower body dressing/undressing steps patient completed: Thread/unthread right underwear leg;Thread/unthread left underwear leg;Thread/unthread right pants leg;Thread/unthread left pants leg;Fasten/unfasten right shoe;Don/Doff left shoe;Don/Doff right shoe;Fasten/unfasten left shoe;Fasten/unfasten pants;Pull underwear up/down;Pull pants up/down;Don/Doff right sock;Don/Doff left sock Lower body dressing/undressing: 4: Steadying Assist  FIM - Toileting Toileting steps completed by patient: Adjust clothing prior to toileting;Adjust clothing after toileting Toileting Assistive Devices: Grab bar or rail for support Toileting: 3: Mod-Patient completed 2 of 3 steps  FIM - Radio producer Devices: Grab bars;Walker Toilet Transfers: 3-To toilet/BSC: Mod A (lift or lower assist);3-From toilet/BSC: Mod A (lift or lower assist)  FIM - Control and instrumentation engineer Devices: Walker;Arm rests;HOB elevated;Bed rails Bed/Chair Transfer: 5: Supine > Sit: Supervision (verbal cues/safety issues);5: Bed > Chair or W/C: Supervision (verbal cues/safety issues)  FIM - Locomotion: Wheelchair Distance: 150 Locomotion: Wheelchair: 5: Travels 150 ft or more: maneuvers on rugs and over door sills with supervision, cueing or coaxing FIM - Locomotion: Ambulation Locomotion: Ambulation Assistive Devices: Administrator Ambulation/Gait Assistance: 3: Mod assist Locomotion: Ambulation: 2: Travels 50 - 149 ft with minimal assistance (Pt.>75%)  Comprehension Comprehension Mode: Auditory Comprehension: 3-Understands basic 50 - 74% of the time/requires cueing 25 - 50%  of the time  Expression Expression Mode: Verbal Expression: 3-Expresses basic 50 - 74% of the time/requires cueing  25 - 50% of the time. Needs to repeat parts of sentences.  Social Interaction Social Interaction: 4-Interacts appropriately 75 - 89% of the time - Needs redirection for appropriate language or to initiate interaction.  Problem Solving Problem Solving: 3-Solves basic 50 - 74% of the time/requires cueing 25 - 49% of the time  Memory Memory: 2-Recognizes or recalls 25 - 49% of the time/requires cueing 51 - 75% of the time   Medical Problem List and Plan:  1. Functional deficits secondary to Parkinson's disease with decline in functional status after fever of unknown etiology. Continue Sinemet as directed for Parkinson disease   -adjusted timing of sinemet  -HS seroquel stopped--appears more alert    -pt with severe PD syndrome 2. DVT Prophylaxis/Anticoagulation: Lovenox.  3. Pain Management: Tylenol as needed  4. Mood/dementia: Aricept 10 mg daily, Lexapro 10 mg daily, Seroquel stopped. Bed alarm for safety    5. Neuropsych: This patient is not capable of making decisions on his own behalf.  6. Dysphagia. Dysphagia 3 thin liquid diet after MBS yesterday 7. Recent tooth extraction. Continue doxycycline for now  8. Overactive bladder..Myrbetriq 50 mg daily, Flomax 0.4 mg daily.    9. Hyperlipidemia. Zetia  1 LOS (Days) 12 A FACE TO  FACE EVALUATION WAS PERFORMED  Annaliz Aven T 05/21/2014, 7:57 AM

## 2014-05-22 ENCOUNTER — Inpatient Hospital Stay (HOSPITAL_COMMUNITY): Payer: Medicare Other | Admitting: *Deleted

## 2014-05-22 ENCOUNTER — Inpatient Hospital Stay (HOSPITAL_COMMUNITY): Payer: Medicare Other | Admitting: Speech Pathology

## 2014-05-22 ENCOUNTER — Encounter (HOSPITAL_COMMUNITY): Payer: Medicare Other | Admitting: Occupational Therapy

## 2014-05-22 DIAGNOSIS — G20 Parkinson's disease: Secondary | ICD-10-CM

## 2014-05-22 DIAGNOSIS — I4891 Unspecified atrial fibrillation: Secondary | ICD-10-CM

## 2014-05-22 DIAGNOSIS — F0390 Unspecified dementia without behavioral disturbance: Secondary | ICD-10-CM

## 2014-05-22 NOTE — Progress Notes (Signed)
Speech Language Pathology Daily Session Note  Patient Details  Name: [name] MRN: [id number] Date of Birth: 1941/11/08  Today's Date: 05/22/2014 Time: 7353-2992 Time Calculation (min): 46 min  Short Term Goals: Week 2: SLP Short Term Goal 1 (Week 2): Pt will improve coordination of breath support with articulation for 80% speech intelligibilty in conversations with min assist  SLP Short Term Goal 2 (Week 2): Pt will tolerate his currently prescribed diet with no overt s/s of aspiraiton and min cuing for use of swallowing precautions  SLP Short Term Goal 3 (Week 2): Pt will use external aids to facilitate improved orientaiton to place, date, and situation as well as improved recall of daily events and information for 75% accuracy with min assist.  SLP Short Term Goal 4 (Week 2): Pt will identify 2 environmental modifications that he can use to faciltate improved speech intelligibility during conversations with skilled and unskilled communication partners.   SLP Short Term Goal 5 (Week 2): Pt will sustain attention to basic, familiar tasks for 30-45 minutes with min cues for redirection.   SLP Short Term Goal 6 (Week 2): Pt will tolerate trials of regular textures with no overt s/s of aspiration.   Skilled Therapeutic Interventions: Skilled treatment session focused on addressing patient and caregiver education with spouse.  Wife reports that patient is close to baseline functioning and that she has experience caring for him.  SLP re-educated her regarding s/s of aspiration, use of single step commands or questions and choice of two to promote recall of information.  Wife verbalized understanding and agreed that skilled SLP services following this rehab stay would not be beneficial at this time.  Recommend wrap up education regarding Dys 3 diet restrictions tomorrow.    FIM:  Comprehension Comprehension Mode: Auditory Comprehension: 3-Understands basic 50 - 74% of the time/requires cueing  25 - 50%  of the time Expression Expression Mode: Verbal Expression: 3-Expresses basic 50 - 74% of the time/requires cueing 25 - 50% of the time. Needs to repeat parts of sentences. Social Interaction Social Interaction: 4-Interacts appropriately 75 - 89% of the time - Needs redirection for appropriate language or to initiate interaction. Problem Solving Problem Solving: 3-Solves basic 50 - 74% of the time/requires cueing 25 - 49% of the time Memory Memory: 2-Recognizes or recalls 25 - 49% of the time/requires cueing 51 - 75% of the time  Pain Pain Assessment Pain Assessment: No/denies pain  Therapy/Group: Individual Therapy  [name] Ferretti., CCC-SLP [phone]  [name] 05/22/2014, 4:10 PM

## 2014-05-22 NOTE — Progress Notes (Signed)
05-23-14 Patient is not compliant with safety plan. Patient continues to get out of bed with out assistance after numerous discussion, does not use safety socks. Patient refuses bed alarm.

## 2014-05-22 NOTE — Progress Notes (Signed)
Physical Therapy Session Note  Patient Details  Name: [name] MRN: [id number] Date of Birth: [birth year]  Today's Date: 05/22/2014 Time: 1000-1056 and 1330-1402 Time Calculation (min): 56 min and 32 min  Short Term Goals: Week 2:  PT Short Term Goal 1 (Week 2): = LTG of supervision-min A overall  Skilled Therapeutic Interventions/Progress Updates:    AM Session: Patient received sitting in wheelchair. Prior to session, discussion with primary PT/OT, recommending only household ambulation, but may require wheelchair level intermittently at discharge secondary to patient's fluctuations in CLOF and required assistance. Discussion/education to be done with patient's wife. Session focused on anterior weight shifts with functional movements, sit<>stand and functional transfers, gait training, stair negotiation, wheelchair mobility. Patient performs stand pivot transfer wheelchair>mat with minA, patient benefits from direct, simple commands during transitional movements. Seated anterior weight shifts facilitated with patient's B UEs on therapist's shoulders.  Trunk rotations in supine/hooklying and sitting with 1# bar; emphasis on synchronized head turns with trunk rotations to facilitate increased rotation. Gait training with weighted RW 25' x1 and 30' x1 and minA. Patient demonstrates good sequencing and management of RW. Stair negotiation of 3 stairs x2 with R handrail and min-modA secondary to posterior lean. Patient ascends forwards and descends backwards without cues to do so. Wheelchair mobility >150' with B UE/LE and supervision, mod cues for sequencing and safety with negotiation of obstacles. Patient left sitting in wheelchair with seatbelt donned and all needs within reach.  PM Session: Patient received sitting in wheelchair, wife present. Session focused on family education/observation with patient's wife as well as discharge planning. Extensive discussion with patient and wife about  discharge recommendations: use of RW-not 4WW, minA overall for transfers and ambulation, household ambulation only until CLOF improves, use of transport chair for [place] distances, etc. Patient wife appearing hesitant at first, but with further questioning and explanation, seems more comfortable with d/c recommendations. Discussion about patient's PLOF and assistance required before admission and patient's wife reports he was mod I with household mobility and described about minA for stair negotiation. Emphasized that this was PTA and that patient is requiring more assistance now.  Patient performed gait training 5' x2 with weighted RW and minA, assist required to maintain RW at proper proximity to patient as patient tends to keep BOS too far posterior. Patient's wife observed gait training and reports it is "different" and "a little worse now." Also discussed proper cuing and that patient benefits from direct, simple cues as opposed to step-by-step cues. Patient in wheelchair, handed off to SLP with wife for further family education. Patient's wife would benefit from hands on training prior to discharge.  Therapy Documentation Precautions:  Precautions Precautions: Fall Restrictions Weight Bearing Restrictions: No Pain: Pain Assessment Pain Assessment: No/denies pain (no s/s of pain) Pain Score: 0-No pain Locomotion : Ambulation Ambulation/Gait Assistance: 4: Min assist Wheelchair Mobility Distance: 150   See FIM for current functional status  Therapy/Group: Individual Therapy  Chipper Herb. Avriel Kandel, PT, DPT 05/22/2014, 10:58 AM

## 2014-05-22 NOTE — Progress Notes (Signed)
73 y.o. right-handed male with history significant for Parkinson's disease status post deep brain stimulator by Dr. Angelyn Puntatter [facility], CAD/pacemaker, dementia. Patient lives with his wife used a rollator prior to admission and goes to the gym to participate in a Parkinson's program. Admitted 05/05/2014 with chills, fever and generalized weakness with increasing confusion. Reports of recent tooth extraction 2 days prior to admission. Chest x-ray negative. Cranial CT scan with no acute intracranial abnormalities with bilateral deep brain stimulators via high bifrontal [place] holes  Subjective/Complaints: Alert sitting in bed. No distress. No apparent issues Review of Systems - somewhat limited due to speech/cognitive  Objective: Vital Signs: Blood pressure 126/78, pulse 60, temperature 97.9 F (36.6 C), temperature source Oral, resp. rate 18, height 5\' 11"  (1.803 m), weight 74.39 kg (164 lb), SpO2 93.00%. No results found. Results for orders placed during the hospital encounter of 05/09/14 (from the past 72 hour(s))  GLUCOSE, CAPILLARY     Status: Abnormal   Collection Time    05/21/14  8:59 PM      Result Value Ref Range   Glucose-Capillary 111 (*) 70 - 99 mg/dL      Vitals reviewed.  HENT:  Head: Normocephalic.  Eyes: EOM are normal.  Neck: Normal range of motion. Neck supple. No thyromegaly present.  Cardiovascular: Normal rate and regular rhythm.  Respiratory: Effort normal and breath sounds normal. No respiratory distress.  GI: Soft. Bowel sounds are normal. He exhibits no distension.  Neurological:  More alert. Left hand with continued choreatic activity today. Speech hypophonic/dysarthria but better today.  He was able to carry on a short conversation today.    Skin: Skin is warm and dry.  motor 5/5 in B UE and BLE   Cerebellar intact  No cogwheeling. Persistent tremors bilateral UE's    Assessment/Plan: 1. Functional deficits secondary to Parkinson's  disease which require 3+ hours per day of interdisciplinary therapy in a comprehensive inpatient rehab setting. Physiatrist is providing close team supervision and 24 hour management of active medical problems listed below. Physiatrist and rehab team continue to assess barriers to discharge/monitor patient progress toward functional and medical goals. FIM: FIM - Bathing Bathing Steps Patient Completed: Chest;Right Arm;Left Arm;Abdomen;Right upper leg;Left upper leg;Front perineal area;Buttocks;Right lower leg (including foot);Left lower leg (including foot) Bathing: 5: Supervision: Safety issues/verbal cues (sit<>stand position)  FIM - Upper Body Dressing/Undressing Upper body dressing/undressing steps patient completed: Thread/unthread right sleeve of pullover shirt/dresss;Put head through opening of pull over shirt/dress;Pull shirt over trunk;Thread/unthread left sleeve of pullover shirt/dress Upper body dressing/undressing: 5: Supervision: Safety issues/verbal cues FIM - Lower Body Dressing/Undressing Lower body dressing/undressing steps patient completed: Thread/unthread right underwear leg;Thread/unthread left underwear leg;Thread/unthread right pants leg;Thread/unthread left pants leg;Fasten/unfasten right shoe;Don/Doff left shoe;Don/Doff right shoe;Fasten/unfasten left shoe;Fasten/unfasten pants;Pull underwear up/down;Pull pants up/down;Don/Doff right sock;Don/Doff left sock Lower body dressing/undressing: 4: Steadying Assist  FIM - Toileting Toileting steps completed by patient: Adjust clothing prior to toileting;Adjust clothing after toileting Toileting Assistive Devices: Grab bar or rail for support Toileting: 1: Two helpers  FIM - Diplomatic Services operational officerToilet Transfers Toilet Transfers Assistive Devices: Grab bars Toilet Transfers: 3-From toilet/BSC: Mod A (lift or lower assist);3-To toilet/BSC: Mod A (lift or lower assist)  FIM - Bed/Chair Transfer Bed/Chair Transfer Assistive Devices:  Therapist, occupationalWalker Bed/Chair Transfer: 5: Supine > Sit: Supervision (verbal cues/safety issues);5: Sit > Supine: Supervision (verbal cues/safety issues)  FIM - Locomotion: Wheelchair Distance: 150 Locomotion: Wheelchair: 5: Travels 150 ft or more: maneuvers on rugs and over door sills with supervision,  cueing or coaxing FIM - Locomotion: Ambulation Locomotion: Ambulation Assistive Devices: Walker - Rolling Ambulation/Gait Assistance: 3: Mod assist Locomotion: Ambulation: 2: Travels 50 - 149 ft with minimal assistance (Pt.>75%)  Comprehension Comprehension Mode: Auditory Comprehension: 3-Understands basic 50 - 74% of the time/requires cueing 25 - 50%  of the time  Expression Expression Mode: Verbal Expression: 3-Expresses basic 50 - 74% of the time/requires cueing 25 - 50% of the time. Needs to repeat parts of sentences.  Social Interaction Social Interaction: 4-Interacts appropriately 75 - 89% of the time - Needs redirection for appropriate language or to initiate interaction.  Problem Solving Problem Solving: 3-Solves basic 50 - 74% of the time/requires cueing 25 - 49% of the time  Memory Memory: 2-Recognizes or recalls 25 - 49% of the time/requires cueing 51 - 75% of the time   Medical Problem List and Plan:  1. Functional deficits secondary to Parkinson's disease with decline in functional status after fever of unknown etiology. Continue Sinemet as directed for Parkinson disease   -adjusted timing of sinemet  -HS seroquel stopped due to am lethargy   -pt with severe PD syndrome 2. DVT Prophylaxis/Anticoagulation: Lovenox.  3. Pain Management: Tylenol as needed  4. Mood/dementia: Aricept 10 mg daily, Lexapro 10 mg daily, Seroquel stopped. Bed alarm for safety    5. Neuropsych: This patient is not capable of making decisions on his own behalf.  6. Dysphagia. Dysphagia 3 thin liquid diet after MBS yesterday 7. Recent tooth extraction. Continue doxycycline for now  8. Overactive  bladder..Myrbetriq 50 mg daily, Flomax 0.4 mg daily.    9. Hyperlipidemia. Zetia  1 LOS (Days) 13 A FACE TO FACE EVALUATION WAS PERFORMED  SWARTZ,ZACHARY T 05/22/2014, 8:21 AM

## 2014-05-22 NOTE — Progress Notes (Signed)
Occupational Therapy Session Note  Patient Details  Name: [name] MRN: [id number] Date of Birth: February 19, 1941  Today's Date: 05/22/2014 Time: 7494-4967 Time Calculation (min): 60 min  Short Term Goals: Week 1:  OT Short Term Goal 1 (Week 1): Patient will perform UB/LB bathing with minimal assistance OT Short Term Goal 1 - Progress (Week 1): Met OT Short Term Goal 2 (Week 1): Patient will don shirt with min assist OT Short Term Goal 2 - Progress (Week 1): Met OT Short Term Goal 3 (Week 1): Patient will perform LB dressing with moderate assistance OT Short Term Goal 3 - Progress (Week 1): Met OT Short Term Goal 4 (Week 1): Patient will be educated on a BUE functional strengthening HEP OT Short Term Goal 4 - Progress (Week 1): Met  Week 2:  OT Short Term Goal 1 (Week 2): Short Term Goals = Long Term Goals secondary to estimated d/c date of 05/24/14  Skilled Therapeutic Interventions/Progress Updates:  Patient received supine in bed with [facility] raised and RN present for medication management. Patient engaged in bed mobility with supervision, then transferred EOB>w/c with minimal assistance and min verbal cues. Patient with urine incontinence; brief and shirt soaked. Therapist assisted patient > bathroom in w/c and patient transferred w/c>elevated toilet seat with minimal assistance. Patient transferred back to w/c, then w/c > tub transfer bench in shower. UB/LB bathing completed in sit<>stand position with supervision during standing and for initiation during bathing. Patient dried, then transferred > w/c for UB/LB dressing in sit<>stand position and grooming tasks seated in w/c. Patient continues to require minimal physical assistance and min>mod multimodal cues for initiation, problem solving, and memory. Therapist donned bilateral TEDs and left patient seated in w/c with quick release belt donned and all needs within reach.   Precautions:  Precautions Precautions: Fall Restrictions Weight  Bearing Restrictions: No  See FIM for current functional status  Therapy/Group: Individual Therapy  Iliya Spivack 05/22/2014, 11:02 AM

## 2014-05-23 ENCOUNTER — Inpatient Hospital Stay (HOSPITAL_COMMUNITY): Payer: Medicare Other

## 2014-05-23 ENCOUNTER — Encounter (HOSPITAL_COMMUNITY): Payer: Medicare Other | Admitting: Occupational Therapy

## 2014-05-23 ENCOUNTER — Encounter (HOSPITAL_COMMUNITY): Payer: Medicare Other | Admitting: Speech Pathology

## 2014-05-23 DIAGNOSIS — I4891 Unspecified atrial fibrillation: Secondary | ICD-10-CM

## 2014-05-23 DIAGNOSIS — G20 Parkinson's disease: Secondary | ICD-10-CM

## 2014-05-23 DIAGNOSIS — F0390 Unspecified dementia without behavioral disturbance: Secondary | ICD-10-CM

## 2014-05-23 NOTE — Discharge Summary (Signed)
Discharge summary job [phone]

## 2014-05-23 NOTE — Progress Notes (Signed)
Speech Language Pathology Discharge Summary  Patient Details  Name: [name] MRN: [id number] Date of Birth: 1941/01/18  Today's Date: 05/23/2014 Time: 2774-1287 Time Calculation (min): 27 min  Skilled Therapeutic Interventions:  Pt was seen for skilled speech therapy targeting family education prior to discharge.  SLP reviewed and reinforced recommendations from yesterday's training session with return demonstration of at least one distraction management technique by pt's wife to facilitate pt's sustained attention.  SLP also provided pt and pt's wife with a diet handout related to pt's currently prescribed diet with recommendations to advance pt's diet as tolerated upon return home given that his swallowing function is returned to baseline.  All pt and pt's wife's questions were answered to their satisfaction at this time and are in agreement with no follow up speech services and 24/7 supervision for safety.       Patient has met 9 of 10 long term goals.  Patient to discharge at overall Min;Supervision level.  Reasons goals not met: inconsistent progress    Clinical Impression/Discharge Summary:  Patient has made functional gains and has met 9 of 10 long term goals this admission due to improved endurance for swallowing during meals, sustained and selective attention, orientation, and use of external aids for recall of daily events and information. Patient is currently an overall min assist-supervision level for basic cognitive tasks and requires supervision cues for utilization of swallowing compensatory strategies to minimize overt s/s of aspiration with dys 3 textures and thin liquids.  [name] reports that pt has returned to his cognitive and swallowing function baseline and was provided with recommendations to advance pt as tolerated by SLP upon return home.  Patient and family education is complete and patient will discharge home with 24/7 supervision from family, friends, etc.  Given  that pt is returned to baseline for cognition and swallowing function, no further SLP needs are anticipated at this time.  Care Partner:  Caregiver Able to Provide Assistance: Yes  Type of Caregiver Assistance: Physical;Cognitive  Recommendation:  24 hour supervision/assistance;Other (comment) (Pt returned to swallowing and cognitive baseline, no further ST needs indicated at this time. )      Equipment: none recommended by SLP   Reasons for discharge: Discharged from hospital   Patient/Family Agrees with Progress Made and Goals Achieved: Yes   See FIM for current functional status  Windell Moulding, M.A. CCC-SLP  Tujuana Kilmartin, Selinda Orion 05/23/2014, 5:14 PM

## 2014-05-23 NOTE — Progress Notes (Signed)
Occupational Therapy Session Notes  Patient Details  Name: [name] MRN: [id number] Date of Birth: [birth year]  Today's Date: 05/23/2014  Short Term Goals: Week 1:  OT Short Term Goal 1 (Week 1): Patient will perform UB/LB bathing with minimal assistance OT Short Term Goal 1 - Progress (Week 1): Met OT Short Term Goal 2 (Week 1): Patient will don shirt with min assist OT Short Term Goal 2 - Progress (Week 1): Met OT Short Term Goal 3 (Week 1): Patient will perform LB dressing with moderate assistance OT Short Term Goal 3 - Progress (Week 1): Met OT Short Term Goal 4 (Week 1): Patient will be educated on a BUE functional strengthening HEP OT Short Term Goal 4 - Progress (Week 1): Met  Week 2:  OT Short Term Goal 1 (Week 2): Short Term Goals = Long Term Goals secondary to estimated d/c date of 05/24/14  Skilled Therapeutic Interventions/Progress Updates:   Session #1 0935-1030 - 55 Minutes Individual Therapy No complaint of pain Patient received supine in bed. Patient engaged in bed mobility with supervision and min verbal cues for correct technique. Patient then stood with RW and min assist and ambulated > bathroom. Patient performed toilet transfer, then shower stall transfer using DME prn; patient min assist with transfers and functional ambulation using RW. Patient dried in shower, donned gripper socks, then ambulated > EOB for UB/LB dressing. Patient continues to require up to moderate multimodal cues during functional tasks secondary to decreased safety awareness, decreased problem solving, decreased initiation, decreased attention. Patient does best with direct cues. Patient sat in w/c at sink to complete grooming tasks. At end of session, left patient seated in w/c with quick release belt donned and all needs within reach.   *Family education scheduled for this afternoon with patient's wife.   Session #2 [id number] - 35 Minutes  Individual Therapy No complaints of  pain Patient received seated in w/c with wife present. Therapist talked with patient and patient's wife regarding below "Recommendations for home". Wife agreed with recommendations. Patient's wife then propelled patient > ADL apartment. Therapist educated patient and patient's wife on safe & effective shower stall transfers using shower seat and RW. Therapist also worked with patient and patient's wife on multiple transfers using RW. Patient continues to require up to mod assist for initiation, problem solving, attention, and safety awareness during functional mobility/transfers. At end of session, left patient seated in w/c with PT present for next therapy session.   Recommendations for home:  Always use weighted RW during ambulation and transfers Patient is to always have either gripper socks or shoes on during all transfers & ambulation Caregiver should provided hands on assist during all transfers, sit<>stands, and standing tasks Patient requires 24/7 supervision/min assist at this time Sit for grooming tasks at sink  *Patient's wife aware of recommendations and agreed to all.   Precautions:  Precautions Precautions: Fall Restrictions Weight Bearing Restrictions: No  See FIM for current functional status  [name] 05/23/2014, 7:26 AM

## 2014-05-23 NOTE — Progress Notes (Signed)
Physical Therapy Discharge Summary  Patient Details  Name: [name] MRN: [id number] Date of Birth: 1941-05-30  Today's Date: 05/23/2014  Patient has met 9 of 9 long term goals due to improved activity tolerance, improved balance, improved postural control, improved attention and improved coordination.  Patient to discharge at an ambulatory level [place] with 2WW and use of transport w/c for community mobility.   Patient's care partner is independent to provide the necessary physical, cognitive and supervision assistance at discharge.  Reasons goals not met: All goals met  Recommendation:  Patient will benefit from ongoing skilled PT services in [place] and then outpatient with focus on Parkinsons rehabilitation to continue to advance safe functional mobility, address ongoing impairments in LE and core weakness, impaired motor control, timing/sequencing, coordination, activity tolerance/endurance, balance, postural control, gait, and minimize fall risk.  Equipment: RW, transport w/c  Reasons for discharge: treatment goals met and discharge from hospital  Patient/family agrees with progress made and goals achieved: Yes  PT Discharge Precautions/Restrictions Precautions Precautions: Fall Restrictions Weight Bearing Restrictions: No Vital Signs Therapy Vitals Temp: 97.6 F (36.4 C) Temp src: Oral Pulse Rate: 60 Resp: 18 BP: 116/74 mmHg Patient Position (if appropriate): Lying Oxygen Therapy SpO2: 93 % O2 Device: None (Room air) Sensation Sensation Light Touch: Appears Intact Proprioception: Appears Intact Additional Comments: BUEs appear intact Coordination Gross Motor Movements are Fluid and Coordinated: Yes Fine Motor Movements are Fluid and Coordinated: Not tested Motor  Motor Motor: Other (comment) (parkinsonism) Motor - Skilled Clinical Observations: Occasional resting tremor, rigidity Motor - Discharge Observations: Occasional resting tremor, rigidity   Mobility Bed Mobility Bed Mobility: Sit to Supine;Supine to Sit;Scooting to HOB Supine to Sit: 4: Min guard Sit to Supine: 4: Min guard Scooting to HOB: 5: Set up Scooting to [facility] Details: Visual cues/gestures for precautions/safety Scooting to [facility] Details (indicate cue type and reason): use of BLE to increase mobility Transfers Transfers: Yes Locomotion  Ambulation Ambulation: Yes Ambulation/Gait Assistance: 4: Min assist Ambulation Distance (Feet): 200 Feet Assistive device: Rolling walker Ambulation/Gait Assistance Details: Verbal cues for gait pattern;Verbal cues for precautions/safety;Verbal cues for sequencing Gait Gait: Yes Gait Pattern: Impaired Gait Pattern: Scissoring;Narrow base of support;Trunk flexed;Festinating Stairs / Additional Locomotion Stairs: Yes Stairs Assistance: 4: Min assist Stairs Assistance Details: Tactile cues for placement;Verbal cues for technique;Verbal cues for precautions/safety Stairs Assistance Details (indicate cue type and reason): VC's for hand placement/use with wife supporting R side up/ L side down Stair Management Technique: One rail Left Number of Stairs: 4 Height of Stairs: 6 Wheelchair Mobility Wheelchair Mobility: Yes Wheelchair Assistance: 3: Building surveyor Details: Verbal cues for Information systems manager: Both upper extremities Wheelchair Parts Management: Needs assistance Distance: 200  Event organiser Standing - Balance Support: Bilateral upper extremity supported Static Standing - Level of Assistance: 4: Min assist Static Standing - Comment/# of Minutes: 5 Extremity Assessment  RUE Assessment RUE Assessment: Within Functional Limits LUE Assessment LUE Assessment: Within Functional Limits RLE Assessment RLE Assessment: Within Functional Limits LLE Assessment LLE Assessment: Within Functional Limits  See FIM for current functional status  Raylene Everts  [facility] 05/23/2014, 4:32 PM

## 2014-05-23 NOTE — Progress Notes (Signed)
Occupational Therapy Discharge Summary  Patient Details  Name: [name] MRN: [id number] Date of Birth: 28-Jan-1941  Today's Date: 05/23/2014  Patient has met 12 of 12 long term goals due to improved activity tolerance, improved balance, postural control, ability to compensate for deficits, improved attention, improved awareness and improved coordination.  Patient to discharge at [facility] Assist level.  Patient's care partner is independent to provide the necessary physical assistance at discharge.  Please see last OT note for more information about caregiver education and recommendations for home.   Reasons goals not met: n/a, all goals met at this time.  Recommendation:  Patient will benefit from ongoing skilled OT services in home health setting to continue to advance functional skills in the area of BADL, iADL and Reduce care partner burden.  Equipment: shower seat  Reasons for discharge: treatment goals met and discharge from hospital  Patient/family agrees with progress made and goals achieved: Yes  Precautions/Restrictions  Precautions Precautions: Fall Restrictions Weight Bearing Restrictions: No  Vital Signs Therapy Vitals Temp: 97.6 F (36.4 C) Temp src: Oral Pulse Rate: 60 Resp: 18 BP: 116/74 mmHg Patient Position (if appropriate): Lying Oxygen Therapy SpO2: 93 % O2 Device: None (Room air)  ADL - See FIM  Vision/Perception  Vision- History Baseline Vision/History: Wears glasses Wears Glasses: At all times Patient Visual Report: No change from baseline   Cognition Overall Cognitive Status: History of cognitive impairments - at baseline Arousal/Alertness: Awake/alert Orientation Level: Oriented X4 Attention: Sustained Sustained Attention: Impaired Sustained Attention Impairment: Functional complex Memory: Impaired Memory Impairment: Decreased recall of new information;Decreased short term memory Decreased Short Term Memory: Verbal  complex Awareness: Impaired Awareness Impairment: Intellectual impairment Problem Solving: Impaired Problem Solving Impairment: Functional basic;Verbal basic Executive Function: Self Monitoring;Self Correcting Self Monitoring: Impaired Self Monitoring Impairment: Verbal basic;Functional basic Self Correcting: Impaired Self Correcting Impairment: Verbal basic;Functional basic Behaviors: Impulsive;Restless Safety/Judgment: Impaired  Sensation Sensation Light Touch: Appears Intact Proprioception: Appears Intact Additional Comments: BUEs appear intact Coordination Gross Motor Movements are Fluid and Coordinated: Yes Fine Motor Movements are Fluid and Coordinated: Yes  Motor  Motor Motor: Other (comment) (parkinsonism) Motor - Skilled Clinical Observations: Occasional resting tremor, rigidity Motor - Discharge Observations: Occasional resting tremor, rigidity  Mobility  Bed Mobility Bed Mobility: Sit to Supine;Supine to Sit;Scooting to HOB Supine to Sit: 4: Min guard Sit to Supine: 4: Min guard Scooting to HOB: 5: Set up Scooting to [facility] Details: Visual cues/gestures for precautions/safety Scooting to [facility] Details (indicate cue type and reason): use of BLE to increase mobility   Balance Static Standing Balance Static Standing - Balance Support: Bilateral upper extremity supported Static Standing - Level of Assistance: 4: Min assist Static Standing - Comment/# of Minutes: 5  Extremity/Trunk Assessment RUE Assessment RUE Assessment: Within Functional Limits LUE Assessment LUE Assessment: Within Functional Limits  See FIM for current functional status  [name] 05/23/2014, 6:08 PM

## 2014-05-23 NOTE — Plan of Care (Signed)
Problem: RH Awareness Goal: LTG: Patient will demonstrate intellectual/emergent (SLP) LTG: Patient will demonstrate intellectual/emergent/anticipatory awareness with assist during a cognitive/linguistic activity (SLP)  Outcome: Not Met (add Reason) Pt made inconsistent progress towards meeting emergent awareness goals.

## 2014-05-23 NOTE — Progress Notes (Signed)
Physical Therapy Session Note  Patient Details  Name: [name] MRN: [id number] Date of Birth: [birth year]  Today's Date: 05/23/2014 Time: 1100-1130 Time Calculation (min): 30 min  Short Term Goals: Week 1:  PT Short Term Goal 1 (Week 1): Pt will ambulate 17' w/ LRAD w/ MinA PT Short Term Goal 1 - Progress (Week 1): Met PT Short Term Goal 2 (Week 1): Pt will transfers bed to/from w/c w/ LRAD w/ MinA PT Short Term Goal 2 - Progress (Week 1): Progressing toward goal PT Short Term Goal 3 (Week 1): Pt will propel w/c 50' in controlled environment w/ MinA and min cues PT Short Term Goal 3 - Progress (Week 1): Discontinued (comment) PT Short Term Goal 4 (Week 1): Pt will perform all bed mobility with MinA PT Short Term Goal 4 - Progress (Week 1): Met Week 2:  PT Short Term Goal 1 (Week 2): = LTG of supervision-min A overall  Skilled Therapeutic Interventions/Progress Updates:  Pt. Seated in w/c asleep with gait belt applied for safety. Pt. Awakened but was disoriented; stated he needed to go to the bathroom.   Self-Care: Pt. To toilet with min A for weighted RW use, obstacle avoidance, and personal hygiene. Pt. Required steady for doffing/donning shorts and briefs.  Gait: Pt. Ambulated 24' x2 with weighted RW at min A for safety. Pt. Requires constant verbal and tactile cues for step width, step length and proper RW sequencing. Pt. Continues to attempt to use RW as 4WW that previously used at home/community. Pt. Requires extra time for planning movements and has decreased ability to perform turns to sit in chair or w/c. Pt. Demonstrates fatigue and requires therapeutic rest/recovery break between gait training.  Pt. Has no c/o pain during session.  Pt. Seated in W/C with gait belt applied for safety with RN tech present in room. Pt. Needs within arm reach and lunch was arriving in hallway.  Therapy Documentation Precautions:  Precautions Precautions: Fall Restrictions Weight Bearing  Restrictions: No  Locomotion : Ambulation Ambulation/Gait Assistance: 4: Min assist   See FIM for current functional status  Therapy/Group: Individual Therapy  Juluis Mire 05/23/2014, 12:47 PM

## 2014-05-23 NOTE — Discharge Summary (Signed)
[name]:  [name], [name]               ACCOUNT NO.:  [id number]  MEDICAL RECORD NO.:  [id number]  LOCATION:  [id number]                        FACILITY:  [facility]  PHYSICIAN:  Ranelle OysterZachary T. Swartz, M.D.DATE OF BIRTH:  27-Mar-1941  DATE OF ADMISSION:  05/09/2014 DATE OF DISCHARGE:                              DISCHARGE SUMMARY   DISCHARGE DIAGNOSES: 1. Functional deficit secondary to Parkinson disease. 2. Subcutaneous Lovenox for deep vein thrombosis prophylaxis. 3. Pain management. 4. History of dementia. 5. Dysphagia. 6. Recent tooth extraction. 7. Overactive bladder.  HISTORY OF PRESENT ILLNESS:  This is a 73 year old right-handed male with history of Parkinson disease status post deep brain stimulator by Dr[facility] Ang[facility]atter of [facility], CAD with pacemaker, and dementia.  The patient lives with his wife, used a rolling walker prior to admission, and goes to the gym to participate in a Parkinson's program.  Admitted on May 05, 2014, with chills, fever, and generalized weakness with increasing confusion.  Reports of recent tooth extraction 2 days prior to admission.  Chest x-ray negative.  Cranial CT scan with no acute abnormalities.  Urine study 25,000 colony multiple species. The patient placed on broad-spectrum antibiotics.  Blood cultures remain negative.  Panorex negative for infection.  No reports of abscess. Subcutaneous Lovenox for DVT prophylaxis.  The patient remained on Sinemet for history of Parkinson disease.  Seroquel added for some nighttime restlessness.  He was maintained on a dysphagia 2 thin liquid diet.  Physical and occupational therapy on going.  The patient was admitted for a comprehensive rehab program.  PAST MEDICAL HISTORY:  See discharge diagnoses.  SOCIAL HISTORY:  Lives with spouse.  FUNCTIONAL HISTORY:  Prior to admission, independent with a rolling walker and active.  Functional status upon admission to rehab services was moderate assist to  ambulate 15 feet with a rolling walker, minimal assist supine to sit, min to mod assist for activities of daily living.  PHYSICAL EXAMINATION:  VITAL SIGNS:  Blood pressure 120/64, pulse 77, temperature 97.6, respirations 18. GENERAL:  This was an alert male, no acute distress.  Speech was a bit halting, but intelligible.  He was able to provide his name, date of birth, limited medical historian. LUNGS:  Clear to auscultation. CARDIAC:  Regular rate and rhythm. ABDOMEN:  Soft, nontender.  Good bowel sounds.  REHABILITATION HOSPITAL COURSE:  The patient was admitted to inpatient rehab services with therapies initiated on a 3-hour daily basis consisting of physical therapy, occupational therapy, speech therapy, and rehabilitation nursing.  The following issues were addressed during the patient's rehabilitation stay.  Pertaining to [name]'s functional deficits secondary to Parkinson disease remained stable, he continued on Sinemet.  He would follow up with [facility]surgery at [facility] for history of deep brain stimulator.  He continued on subcutaneous Lovenox for DVT prophylaxis.  He did have a documented history of dementia.  He continued on Aricept, his Seroquel had been discontinued due to some lethargy, he continued on Lexapro at bedtime. He was participating fully with therapies.  A bed alarm was in place for safety.  His diet was advanced to mechanical soft, which he tolerated well.  He had completed a course of doxycycline  for recent tooth extraction, recent Panorex film showed no signs of abscess.  He did have a history of overactive bladder.  He remained on Myrbetriq 50 mg daily as well as Flomax.  Recent urine studies negative.  The patient received weekly collaborative interdisciplinary team conferences to discuss estimated length of stay, family teaching, and any barriers to discharge.  Sessions focused on household ambulation, trunk rotation, ambulating 30 feet min  assist with assistive device, stair negotiation min to mod assist secondary to posterior lean, supervision for wheelchair mobility.  Sessions focused on family education, extensive discussions with the patient and wife about discharged recommendations and supervision for safety.  His overall mobility for ambulation did improve to 55 feet x2 rolling walker, minimal assist.  Activities of daily living, the patient engage in bed mobility with supervision and transferred edge of bed, wheelchair with minimal assistance and some minimal verbal cues.  The patient transferred wheelchair to elevated toilet seat with minimal assistance.  Upper body, lower body bathing completed sit to stand position with supervision.  Full family teaching was completed and recommendations were for a Home Health physical and occupational therapy.  DISCHARGE MEDICATIONS: 1. Sinemet 25-100 mg 2 tablets q.i.d. 1 tablet bedtime. 2. Dapsone 25 mg p.o. b.i.d. 3. Aricept 10 mg p.o. daily. 4. Comtan 200 mg p.o. q.i.d. 5. Lexapro 10 mg p.o. at bedtime. 6. Zetia 10 mg p.o. daily. 7. Magnesium oxide 400 mg p.o. daily. 8. Myrbetriq 50 mg p.o. daily. 9. Nitroglycerin as needed for chest pain. 10.Flomax 0.4 mg p.o. daily. 11.Vitamin B12 1000 mcg p.o. daily.  DIET:  Mechanical soft.  SPECIAL INSTRUCTIONS:  The patient would follow up with Dr. Faith Rogue at the outpatient rehab service office as directed; Dr. Angelyn Punt, Neurosurgery, call for appointment; Dr. Neena Rhymes, medical management.  Home Health physical and occupational therapies have been arranged.     Mariam Dollar, P.A.   ______________________________ Ranelle Oyster, M.D.    DA/MEDQ  D:  05/23/2014  T:  05/23/2014  Job:  [id number]  cc:   Neena Rhymes, M.D. Brooke Pace, MD

## 2014-05-23 NOTE — Progress Notes (Signed)
73 y.o. right-handed male with history significant for Parkinson's disease status post deep brain stimulator by Dr. Angelyn Punt neurosurgery at [facility], CAD/pacemaker, dementia. Patient lives with his wife used a rollator prior to admission and goes to the gym to participate in a Parkinson's program. Admitted 05/05/2014 with chills, fever and generalized weakness with increasing confusion. Reports of recent tooth extraction 2 days prior to admission. Chest x-ray negative. Cranial CT scan with no acute intracranial abnormalities with bilateral deep brain stimulators via high bifrontal Burr holes  Subjective/Complaints: No new issues. Family ed underway.  Review of Systems - somewhat limited due to speech/cognitive  Objective: Vital Signs: Blood pressure 121/81, pulse 60, temperature 97.7 F (36.5 C), temperature source Oral, resp. rate 18, height 5\' 11"  (1.803 m), weight 74.39 kg (164 lb), SpO2 95.00%. No results found. Results for orders placed during the hospital encounter of 05/09/14 (from the past 72 hour(s))  GLUCOSE, CAPILLARY     Status: Abnormal   Collection Time    05/21/14  8:59 PM      Result Value Ref Range   Glucose-Capillary 111 (*) 70 - 99 mg/dL      Vitals reviewed.  HENT:  Head: Normocephalic.  Eyes: EOM are normal.  Neck: Normal range of motion. Neck supple. No thyromegaly present.  Cardiovascular: Normal rate and regular rhythm.  Respiratory: Effort normal and breath sounds normal. No respiratory distress.  GI: Soft. Bowel sounds are normal. He exhibits no distension.  Neurological:  More alert. Left hand with continued choreatic movements which come and go. Speech hypophonic/dysarthria but better as a whole.      Skin: Skin is warm and dry.  motor 5/5 in B UE and BLE   Cerebellar intact  No cogwheeling. Persistent tremors bilateral UE's    Assessment/Plan: 1. Functional deficits secondary to Parkinson's disease which require 3+ hours per day of  interdisciplinary therapy in a comprehensive inpatient rehab setting. Physiatrist is providing close team supervision and 24 hour management of active medical problems listed below. Physiatrist and rehab team continue to assess barriers to discharge/monitor patient progress toward functional and medical goals. FIM: FIM - Bathing Bathing Steps Patient Completed: Chest;Right Arm;Left Arm;Abdomen;Right upper leg;Left upper leg;Front perineal area;Buttocks;Right lower leg (including foot);Left lower leg (including foot) Bathing: 5: Supervision: Safety issues/verbal cues (sit<>stand position)  FIM - Upper Body Dressing/Undressing Upper body dressing/undressing steps patient completed: Thread/unthread right sleeve of pullover shirt/dresss;Put head through opening of pull over shirt/dress;Pull shirt over trunk;Thread/unthread left sleeve of pullover shirt/dress Upper body dressing/undressing: 5: Supervision: Safety issues/verbal cues FIM - Lower Body Dressing/Undressing Lower body dressing/undressing steps patient completed: Thread/unthread right underwear leg;Thread/unthread left underwear leg;Thread/unthread right pants leg;Thread/unthread left pants leg;Fasten/unfasten right shoe;Don/Doff left shoe;Don/Doff right shoe;Fasten/unfasten left shoe;Fasten/unfasten pants;Pull underwear up/down;Pull pants up/down;Don/Doff right sock;Don/Doff left sock Lower body dressing/undressing: 4: Steadying Assist  FIM - Toileting Toileting steps completed by patient: Adjust clothing prior to toileting;Adjust clothing after toileting Toileting Assistive Devices: Grab bar or rail for support Toileting: 1: Two helpers  FIM - Diplomatic Services operational officer Devices: Art gallery manager Transfers: 3-From toilet/BSC: Mod A (lift or lower assist)  FIM - Banker Devices: Therapist, occupational: 4: Bed > Chair or W/C: Min A (steadying Pt. > 75%);4: Chair or W/C > Bed: Min  A (steadying Pt. > 75%)  FIM - Locomotion: Wheelchair Distance: 150 Locomotion: Wheelchair: 1: Total Assistance/staff pushes wheelchair (Pt<25%) FIM - Locomotion: Ambulation Locomotion: Ambulation Assistive Devices: Designer, industrial/product Ambulation/Gait Assistance: 4: Min  assist Locomotion: Ambulation: 2: Travels 50 - 149 ft with minimal assistance (Pt.>75%)  Comprehension Comprehension Mode: Auditory Comprehension: 3-Understands basic 50 - 74% of the time/requires cueing 25 - 50%  of the time  Expression Expression Mode: Verbal Expression: 3-Expresses basic 50 - 74% of the time/requires cueing 25 - 50% of the time. Needs to repeat parts of sentences.  Social Interaction Social Interaction: 4-Interacts appropriately 75 - 89% of the time - Needs redirection for appropriate language or to initiate interaction.  Problem Solving Problem Solving: 3-Solves basic 50 - 74% of the time/requires cueing 25 - 49% of the time  Memory Memory: 2-Recognizes or recalls 25 - 49% of the time/requires cueing 51 - 75% of the time   Medical Problem List and Plan:  1. Functional deficits secondary to Parkinson's disease with decline in functional status after fever of unknown etiology. Continue Sinemet as directed for Parkinson disease   -adjusted timing of sinemet  -HS seroquel stopped due to am lethargy   -pt with severe PD syndrome symptoms of which are likely to continue to progress 2. DVT Prophylaxis/Anticoagulation: Lovenox.  3. Pain Management: Tylenol as needed  4. Mood/dementia: Aricept 10 mg daily, Lexapro 10 mg daily, Seroquel stopped. Bed alarm for safety    5. Neuropsych: This patient is not capable of making decisions on his own behalf.  6. Dysphagia. Dysphagia 3 thin liquid diet after MBS yesterday 7. Recent tooth extraction. Continue doxycycline for now  8. Overactive bladder..Myrbetriq 50 mg daily, Flomax 0.4 mg daily.    9. Hyperlipidemia. Zetia  1 LOS (Days) 14 A FACE TO FACE  EVALUATION WAS PERFORMED  Yanett Conkright T 05/23/2014, 7:57 AM

## 2014-05-23 NOTE — Progress Notes (Signed)
Physical Therapy Session Note  Patient Details  Name: HALDON CARLEY MRN: [id number] Date of Birth: 1941/05/28  Today's Date: 05/23/2014 Time: 1402-1500 Time Calculation (min): 58 min  Short Term Goals: Week 1:  PT Short Term Goal 1 (Week 1): Pt will ambulate 43' w/ LRAD w/ MinA PT Short Term Goal 1 - Progress (Week 1): Met PT Short Term Goal 2 (Week 1): Pt will transfers bed to/from w/c w/ LRAD w/ MinA PT Short Term Goal 2 - Progress (Week 1): Progressing toward goal PT Short Term Goal 3 (Week 1): Pt will propel w/c 50' in controlled environment w/ MinA and min cues PT Short Term Goal 3 - Progress (Week 1): Discontinued (comment) PT Short Term Goal 4 (Week 1): Pt will perform all bed mobility with MinA PT Short Term Goal 4 - Progress (Week 1): Met Week 2:  PT Short Term Goal 1 (Week 2): = LTG of supervision-min A overall Week 3:     Skilled Therapeutic Interventions/Progress Updates:  Pt. Received in hallway with OT and wife present. Pt. And wife agreeable to therapy session / family education prior to D/C tomorrow.  Pt. And wife educated, demonstrated for, and coached on proper transfer techniques for car, bed, floor, and stairs during focused session. PT. And wife able to remain safe with occasional cues for adjusting techniques for safety. Pt. Endurance/activity levels diminished with fatigue and required therapeutic rest/recovery breaks. Pt. Performed static standing on compliant surface and performed stand<>sit on floor/mat for functional safety. Pt. Required verbal cues for proper sequencing of extremities, trunk, and proper use of walker and wife for support lowering and standing up. Pt. Required min A for safety from wife to perform all tasks/activities with all questions being addressed and coached on asking if any additional concerns or questions arise. Pt. Demonstrated functional use of w/c for transfers and community use with min A for safety and management.  PAIN: No  complaints or reports of pain.  Pt. Toileted (BM) upon returning to room.   Pt. In room supine in bed with additional education for pt. And wife on use of bed mobility. All needs within reach with SW present in room for additional information/questions.   Therapy Documentation Precautions:  Precautions Precautions: Fall Restrictions Weight Bearing Restrictions: No General:   Vital Signs: Therapy Vitals Temp: 97.6 F (36.4 C) Temp src: Oral Pulse Rate: 60 Resp: 18 BP: 116/74 mmHg Patient Position (if appropriate): Lying Oxygen Therapy SpO2: 93 % O2 Device: None (Room air) Pain:   Mobility: Bed Mobility Bed Mobility: Sit to Supine;Supine to Sit;Scooting to HOB Supine to Sit: 4: Min guard Sit to Supine: 4: Min guard Scooting to HOB: 5: Set up Scooting to [facility] Details: Visual cues/gestures for precautions/safety Scooting to [facility] Details (indicate cue type and reason): use of BLE to increase mobility Transfers Transfers: Yes Locomotion : Ambulation Ambulation: Yes Ambulation/Gait Assistance: 4: Min assist Ambulation Distance (Feet): 200 Feet Assistive device: Rolling walker Ambulation/Gait Assistance Details: Verbal cues for gait pattern;Verbal cues for precautions/safety;Verbal cues for sequencing Gait Gait: Yes Gait Pattern: Impaired Gait Pattern: Scissoring;Narrow base of support;Trunk flexed;Festinating Stairs / Additional Locomotion Stairs: Yes Stairs Assistance: 4: Min assist Stairs Assistance Details: Tactile cues for placement;Verbal cues for technique;Verbal cues for precautions/safety Stairs Assistance Details (indicate cue type and reason): VC's for hand placement/use with wife supporting R side up/ L side down Stair Management Technique: One rail Left Number of Stairs: 4 Height of Stairs: 6 Wheelchair Mobility Wheelchair Mobility: Yes Wheelchair Assistance:  3: Mod assist Wheelchair Assistance Details: Verbal cues for Engineer, maintenance: Both upper extremities Wheelchair Parts Management: Needs assistance Distance: 200  Trunk/Postural Assessment :    Balance: Static Standing Balance Static Standing - Balance Support: Bilateral upper extremity supported Static Standing - Level of Assistance: 4: Min assist Static Standing - Comment/# of Minutes: 5  See FIM for current functional status  Therapy/Group: Individual Therapy  Juluis Mire 05/23/2014, 4:10 PM

## 2014-05-24 ENCOUNTER — Telehealth: Payer: Self-pay

## 2014-05-24 DIAGNOSIS — I4891 Unspecified atrial fibrillation: Secondary | ICD-10-CM

## 2014-05-24 DIAGNOSIS — F0390 Unspecified dementia without behavioral disturbance: Secondary | ICD-10-CM

## 2014-05-24 DIAGNOSIS — G20 Parkinson's disease: Secondary | ICD-10-CM

## 2014-05-24 MED ORDER — VITAMIN B-12 1000 MCG PO TABS: 1000.0000 ug | ORAL_TABLET | Freq: Every day | ORAL | Status: AC

## 2014-05-24 MED ORDER — ENTACAPONE 200 MG PO TABS: 200.0000 mg | ORAL_TABLET | Freq: Four times a day (QID) | ORAL | Status: DC

## 2014-05-24 MED ORDER — MIRABEGRON ER 50 MG PO TB24: 50.0000 mg | ORAL_TABLET | Freq: Every day | ORAL | Status: DC

## 2014-05-24 MED ORDER — MAGNESIUM 400 MG PO CAPS: 400.0000 mg | ORAL_CAPSULE | Freq: Every day | ORAL | Status: DC

## 2014-05-24 MED ORDER — ESCITALOPRAM OXALATE 10 MG PO TABS: 10.0000 mg | ORAL_TABLET | Freq: Every day | ORAL | Status: AC

## 2014-05-24 MED ORDER — EZETIMIBE 10 MG PO TABS: 10.0000 mg | ORAL_TABLET | Freq: Every day | ORAL | Status: DC

## 2014-05-24 MED ORDER — TAMSULOSIN HCL 0.4 MG PO CAPS: 0.4000 mg | ORAL_CAPSULE | Freq: Every day | ORAL | Status: DC

## 2014-05-24 NOTE — Progress Notes (Signed)
Social Work Patient ID: [name], male   DOB: August 07, 1941, 73 y.o.   MRN: [id number]  Met with pt and wife following team conference yesterday afternoon.  Discussed therapy f/u and DME.  Explained to pt and wife that Dr. Naaman Plummer recommends f/u with aquatic PT.  Both willing to consider, however, they do not want to start this until it is approved by his neurologist.  (They have appointment with him in August).  Will provide information for them to pursue as they would like.    Curtiss Mahmood, LCSW

## 2014-05-24 NOTE — Progress Notes (Signed)
Social Work Discharge Note  The overall goal for the admission was met for:   Discharge location: Yes - home with wife who can provide 24/7 assistance  Length of Stay: Yes - 15days  Discharge activity level: Yes - minimal assistance overall  Home/community participation: Yes  Services provided included: MD, RD, PT, OT, SLP, RN, TR, Pharmacy and SW  Financial Services: Medicare and Private Insurance: BCBS  Follow-up services arranged: Other: Pt to resume [facility] exercise program  Comments (or additional information):  Patient/Family verbalized understanding of follow-up arrangements: Yes  Individual responsible for coordination of the follow-up plan: pt/ wife  Confirmed correct DME delivered: ,  05/24/2014    ,  

## 2014-05-24 NOTE — Discharge Instructions (Signed)
Inpatient Rehab Discharge Instructions  [name] Discharge date and time: No discharge date for patient encounter.   Activities/Precautions/ Functional Status: Activity: activity as tolerated Diet: Mechanical soft Wound Care: none needed Functional status:  ___ No restrictions     ___ Walk up steps independently _x__ 24/7 supervision/assistance   ___ Walk up steps with assistance ___ Intermittent supervision/assistance  ___ Bathe/dress independently ___ Walk with walker     ___ Bathe/dress with assistance ___ Walk Independently    ___ Shower independently _x__ Walk with assistance    ___ Shower with assistance ___ No alcohol     ___ Return to work/school ________    COMMUNITY REFERRALS UPON DISCHARGE:     Outpatient: PT   [facility] at [facility] if neurology agrees. Phone # [phone]                          Resume classes @ [facility] By Franki Monte                            Medical Equipment/Items Ordered: transport chair, tub seat and rolling walker                                                    Agency/Supplier:  [facility] [phone]      Special Instructions:  Followup with Dr. Angelyn Punt neurosurgery [facility]   My questions have been answered and I understand these instructions. I will adhere to these goals and the provided educational materials after my discharge from the hospital.  Patient/Caregiver Signature _______________________________ Date __________  Clinician Signature _______________________________________ Date __________  Please bring this form and your medication list with you to all your follow-up doctor's appointments.

## 2014-05-24 NOTE — Patient Care Conference (Signed)
Inpatient RehabilitationTeam Conference and Plan of Care Update Date: 05/23/2014   Time: 2:00 PM    Patient Name: [name]      Medical Record Number: [id number]  Date of Birth: [birth year] Sex: Male         Room/Bed: 4W10C/4W10C-01 Payor Info: Payor: MEDICARE / Plan: MEDICARE PART A AND B / Product Type: *No Product type* /    Admitting Diagnosis: Parkinson's  Admit Date/Time:  05/09/2014  3:44 PM Admission Comments: No comment available   Primary Diagnosis:  <principal problem not specified> Principal Problem: <principal problem not specified>  Patient Active Problem List   Diagnosis Date Noted  . Parkinson disease 05/09/2014  . Fever 05/05/2014  . Osteopenia 03/06/2014  . Postsurgical percutaneous transluminal coronary angioplasty status 08/23/2013  . Healed myocardial infarct 08/23/2013  . Obstructive apnea 08/23/2013  . Artificial cardiac pacemaker 08/23/2013  . Unspecified vitamin D deficiency 02/03/2013  . B12 deficiency 02/03/2013  . Dizziness 06/10/2012  . Dermatochalasis of eyelid 03/04/2012  . Error, refractive, myopia 03/04/2012  . Posterior vitreous detachment 03/04/2012  . Pseudoaphakia 03/04/2012  . Atrioventricular block, complete   . Idiopathic Parkinson's disease 08/29/2011  . Hematoma of chest wall 08/26/2011  . Hyperlipidemia 08/22/2011  . CAD (coronary artery disease) 08/22/2011  . Dementia 08/22/2011  . Atrial fibrillation 01/21/2011  . CARDIAC PACEMAKER IN SITU 01/21/2011  . PARKINSON'S DISEASE 11/18/2007  . MYOCARDIAL INFARCTION 11/18/2007  . 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 SLEEP APNEA 11/18/2007    Expected Discharge Date: Expected Discharge Date: 05/24/14  Team Members Present: Physician leading conference: Dr. Alger Simons Social Worker Present: Lennart Pall, LCSW Nurse Present: Elliot Cousin, RN PT Present: Raylene Everts, Lorriane Shire, PT OT Present: Salome Spotted, OT;Jennifer Tamala Julian, OT;Katie Abner Greenspan, OT;Patricia Lebo,  OT SLP Present: Weston Anna, SLP Other (Discipline and Name): Danne Baxter, RN [facility]) PPS Coordinator present : Daiva Nakayama, RN, CRRN;Becky Alwyn Ren, PT     Current Status/Progress Goal Weekly Team Focus  Medical   plateaued. not back to baseline  see prior  family education to account for increased deficits   Bowel/Bladder   Continent of bowel and bladder, LBM 05/22/2014  Remain continent of bowel and bladder  Assess and document bowel and bladder movements.   Swallow/Nutrition/ Hydration    supervision for use of swallowing precautions, dys.3, thin liquids  supervision   family education complete, pt may be advanced as tolerated to regular solids at home.     ADL's   overall min assist  overall min assist (goals downgraded)  Family education, d/c planning   Mobility   Supervision w/c mobility, min A transfers and gait, mod A stairs  Goals downgraded to min A overall  Family education, gait, transfers, balance   Communication   min assist for speech intelligibility   min assist   family education complete, d/c tomorrow    Safety/Cognition/ Behavioral Observations  min-mod assist; fluctuates with lethargy   min assist   family education complete, d/c tomorrow    Pain   No c/o pain  Pain level <3  Assess for verbal and nonverbal cues of pain q shift.   Skin              Rehab Goals Patient on target to meet rehab goals: Yes *See Care Plan and progress notes for long and short-term goals.  Barriers to Discharge: unchanged    Possible Resolutions to Barriers:  see prior    Discharge Planning/Teaching Needs:  home with wife to provide  24/7 assistance (has some private duty assist two days per wk)      Team Discussion:  Limited gains this week.  Family ed completed today.  Having difficult time with rolling walker and recommend using transport w/c at home on "bad days".  Still with incont of bladder sometimes at night.  Has met ST goals.  Ready for d/c tomorrow.  Revisions  to Treatment Plan:  None   Continued Need for Acute Rehabilitation Level of Care: The patient requires daily medical management by a physician with specialized training in physical medicine and rehabilitation for the following conditions: Daily direction of a multidisciplinary physical rehabilitation program to ensure safe treatment while eliciting the highest outcome that is of practical value to the patient.: Yes Daily analysis of laboratory values and/or radiology reports with any subsequent need for medication adjustment of medical intervention for : Post surgical problems;Other  Nikya Busler 05/23/2014, 4:51 PM

## 2014-05-24 NOTE — Progress Notes (Signed)
Pt discharged home with wife. Discharge instructions provided by Harvel Ricks, PA. All questions answered. Pt escorted off unit in w/c with personal belonging as well as home health equipment by Luster Landsberg, NT/Secretary.

## 2014-05-24 NOTE — Progress Notes (Signed)
73 y.o. right-handed male with history significant for Parkinson's disease status post deep brain stimulator [facility], CAD/pacemaker, dementia. Patient lives with his wife used a rollator prior to admission and goes to the gym to participate in a Parkinson's program. Admitted 05/05/2014 with chills, fever and generalized weakness with increasing confusion. Reports of recent tooth extraction 2 days prior to admission. Chest x-ray negative. Cranial CT scan with no acute intracranial abnormalities with bilateral deep brain stimulators via high bifrontal Burr holes  Subjective/Complaints: In good spirits. Excited to be going home.  Review of Systems - somewhat limited due to speech/cognitive  Objective: Vital Signs: Blood pressure 139/84, pulse 91, temperature 97.6 F (36.4 C), temperature source Oral, resp. rate 18, height 5\' 11"  (1.803 m), weight 75.3 kg (166 lb 0.1 oz), SpO2 97.00%. No results found. Results for orders placed during the hospital encounter of 05/09/14 (from the past 72 hour(s))  GLUCOSE, CAPILLARY     Status: Abnormal   Collection Time    05/21/14  8:59 PM      Result Value Ref Range   Glucose-Capillary 111 (*) 70 - 99 mg/dL      Vitals reviewed.  HENT:  Head: Normocephalic.  Eyes: EOM are normal.  Neck: Normal range of motion. Neck supple. No thyromegaly present.  Cardiovascular: Normal rate and regular rhythm.  Respiratory: Effort normal and breath sounds normal. No respiratory distress.  GI: Soft. Bowel sounds are normal. He exhibits no distension.  Neurological:  More alert. Left hand with continued choreatic movements which come and go. Speech hypophonic/dysarthria but better as a whole.      Skin: Skin is warm and dry.  motor 5/5 in B UE and BLE   Cerebellar intact  No cogwheeling. Persistent tremors bilateral UE's    Assessment/Plan: 1. Functional deficits secondary to Parkinson's disease which require 3+ hours per day  of interdisciplinary therapy in a comprehensive inpatient rehab setting. Physiatrist is providing close team supervision and 24 hour management of active medical problems listed below. Physiatrist and rehab team continue to assess barriers to discharge/monitor patient progress toward functional and medical goals.  Home today. outpt aquatic therapy if wife is ok getting him there   FIM: FIM - Bathing Bathing Steps Patient Completed: Chest;Right Arm;Left Arm;Abdomen;Right upper leg;Left upper leg;Front perineal area;Buttocks;Right lower leg (including foot);Left lower leg (including foot) Bathing: 5: Supervision: Safety issues/verbal cues (close supervision for sit<>stand during bathing, using grab bars )  FIM - Upper Body Dressing/Undressing Upper body dressing/undressing steps patient completed: Thread/unthread right sleeve of pullover shirt/dresss;Put head through opening of pull over shirt/dress;Pull shirt over trunk;Thread/unthread left sleeve of pullover shirt/dress Upper body dressing/undressing: 5: Supervision: Safety issues/verbal cues FIM - Lower Body Dressing/Undressing Lower body dressing/undressing steps patient completed: Thread/unthread right underwear leg;Thread/unthread left underwear leg;Thread/unthread right pants leg;Thread/unthread left pants leg;Fasten/unfasten right shoe;Don/Doff left shoe;Don/Doff right shoe;Fasten/unfasten left shoe;Fasten/unfasten pants;Pull underwear up/down;Pull pants up/down;Don/Doff right sock;Don/Doff left sock Lower body dressing/undressing: 4: Steadying Assist  FIM - Toileting Toileting steps completed by patient: Adjust clothing prior to toileting;Performs perineal hygiene;Adjust clothing after toileting Toileting Assistive Devices: Grab bar or rail for support Toileting: 4: Steadying assist  FIM - Diplomatic Services operational officerToilet Transfers Toilet Transfers Assistive Devices: Best boyWalker;Grab bars Toilet Transfers: 4-To toilet/BSC: Min A (steadying Pt. > 75%);4-From  toilet/BSC: Min A (steadying Pt. > 75%)  FIM - BankerBed/Chair Transfer Bed/Chair Transfer Assistive Devices: Walker;Bed rails Bed/Chair Transfer: 5: Sit > Supine: Supervision (verbal cues/safety issues)  FIM - Locomotion: Wheelchair Distance: 200  Locomotion: Wheelchair: 5: Travels 150 ft or more: maneuvers on rugs and over door sills with supervision, cueing or coaxing FIM - Locomotion: Ambulation Locomotion: Ambulation Assistive Devices: Designer, industrial/product Ambulation/Gait Assistance: 4: Min assist Locomotion: Ambulation: 2: Travels 50 - 149 ft with minimal assistance (Pt.>75%)  Comprehension Comprehension Mode: Auditory Comprehension: 4-Understands basic 75 - 89% of the time/requires cueing 10 - 24% of the time  Expression Expression Mode: Verbal Expression: 4-Expresses basic 75 - 89% of the time/requires cueing 10 - 24% of the time. Needs helper to occlude trach/needs to repeat words.  Social Interaction Social Interaction: 4-Interacts appropriately 75 - 89% of the time - Needs redirection for appropriate language or to initiate interaction.  Problem Solving Problem Solving: 3-Solves basic 50 - 74% of the time/requires cueing 25 - 49% of the time  Memory Memory: 4-Recognizes or recalls 75 - 89% of the time/requires cueing 10 - 24% of the time   Medical Problem List and Plan:  1. Functional deficits secondary to Parkinson's disease with decline in functional status after fever of unknown etiology. Continue Sinemet as directed for Parkinson disease   -adjusted timing of sinemet  -HS seroquel stopped due to am lethargy   -pt with severe PD syndrome symptoms of which are likely to continue to progress 2. DVT Prophylaxis/Anticoagulation: Lovenox.  3. Pain Management: Tylenol as needed  4. Mood/dementia: Aricept 10 mg daily, Lexapro 10 mg daily, Seroquel stopped. Bed alarm for safety    5. Neuropsych: This patient is not capable of making decisions on his own behalf.  6. Dysphagia.  Dysphagia 3 thin liquid diet after MBS yesterday 7. Recent tooth extraction. abx completed  8. Overactive bladder..Myrbetriq 50 mg daily, Flomax 0.4 mg daily.    9. Hyperlipidemia. Zetia  1 LOS (Days) 15 A FACE TO FACE EVALUATION WAS PERFORMED  SWARTZ,ZACHARY T 05/24/2014, 8:01 AM

## 2014-05-24 NOTE — Progress Notes (Signed)
Social Work Patient ID: [name], male   DOB: January 20, 1941, 73 y.o.   MRN: [id number]  Lennart Pall, LCSW Social Worker Signed  Patient Care Conference Service date: 05/23/2014 4:51 PM  Inpatient RehabilitationTeam Conference and Plan of Care Update Date: 05/23/2014   Time: 2:00 PM     Patient Name: [name]       Medical Record Number: [id number]   Date of Birth: 1941-05-20 Sex: Male         Room/Bed: 4W10C/4W10C-01 Payor Info: Payor: MEDICARE / Plan: MEDICARE PART A AND B / Product Type: *No Product type* /   Admitting Diagnosis: Parkinson's   Admit Date/Time:  05/09/2014  3:44 PM Admission Comments: No comment available   Primary Diagnosis:  <principal problem not specified> Principal Problem: <principal problem not specified>    Patient Active Problem List     Diagnosis  Date Noted   .  Parkinson disease  05/09/2014   .  Fever  05/05/2014   .  Osteopenia  03/06/2014   .  Postsurgical percutaneous transluminal coronary angioplasty status  08/23/2013   .  Healed myocardial infarct  08/23/2013   .  Obstructive apnea  08/23/2013   .  Artificial cardiac pacemaker  08/23/2013   .  Unspecified vitamin D deficiency  02/03/2013   .  B12 deficiency  02/03/2013   .  Dizziness  06/10/2012   .  Dermatochalasis of eyelid  03/04/2012   .  Error, refractive, myopia  03/04/2012   .  Posterior vitreous detachment  03/04/2012   .  Pseudoaphakia  03/04/2012   .  Atrioventricular block, complete     .  Idiopathic Parkinson's disease  08/29/2011   .  Hematoma of chest wall  08/26/2011   .  Hyperlipidemia  08/22/2011   .  CAD (coronary artery disease)  08/22/2011   .  Dementia  08/22/2011   .  Atrial fibrillation  01/21/2011   .  CARDIAC PACEMAKER IN SITU  01/21/2011   .  PARKINSON'S DISEASE  11/18/2007   .  MYOCARDIAL INFARCTION  11/18/2007   .  DERMATITIS HERPETIFORMIS  11/18/2007   .  SLEEP APNEA  11/18/2007     Expected Discharge Date: Expected Discharge Date:  05/24/14  Team Members Present: Physician leading conference: Dr. Alger Simons Social Worker Present: Lennart Pall, LCSW Nurse Present: Elliot Cousin, RN PT Present: Raylene Everts, Lorriane Shire, PT OT Present: Salome Spotted, OT;Jennifer Tamala Julian, OT;Katie Abner Greenspan, OT;Patricia [place], OT SLP Present: Weston Anna, SLP Other (Discipline and Name): Danne Baxter, RN [facility]) PPS Coordinator present : Daiva Nakayama, RN, CRRN;Becky Alwyn Ren, PT        Current Status/Progress  Goal  Weekly Team Focus   Medical     plateaued. not [name] to baseline  see prior  family education to account for increased deficits   Bowel/Bladder     Continent of bowel and bladder, LBM 05/22/2014  Remain continent of bowel and bladder  Assess and document bowel and bladder movements.   Swallow/Nutrition/ Hydration     supervision for use of swallowing precautions, dys.3, thin liquids  supervision   family education complete, pt may be advanced as tolerated to regular solids at home.     ADL's     overall min assist  overall min assist (goals downgraded)  Family education, d/c planning   Mobility     Supervision w/c mobility, min A transfers and gait, mod A stairs  Goals downgraded to min A overall  Family education, gait, transfers, balance   Communication     min assist for speech intelligibility   min assist   family education complete, d/c tomorrow    Safety/Cognition/ Behavioral Observations    min-mod assist; fluctuates with lethargy   min assist   family education complete, d/c tomorrow    Pain     No c/o pain  Pain level <3  Assess for verbal and nonverbal cues of pain q shift.   Skin             Rehab Goals Patient on target to meet rehab goals: Yes *See Care Plan and progress notes for long and short-term goals.    Barriers to Discharge:  unchanged     Possible Resolutions to Barriers:    see prior      Discharge Planning/Teaching Needs:    home with wife to provide 24/7 assistance (has some  private duty assist two days per wk)      Team Discussion:    Limited gains this week.  Family ed completed today.  Having difficult time with rolling walker and recommend using transport w/c at home on "bad days".  Still with incont of bladder sometimes at night.  Has met ST goals.  Ready for d/c tomorrow.   Revisions to Treatment Plan:    None    Continued Need for Acute Rehabilitation Level of Care: The patient requires daily medical management by a physician with specialized training in physical medicine and rehabilitation for the following conditions: Daily direction of a multidisciplinary physical rehabilitation program to ensure safe treatment while eliciting the highest outcome that is of practical value to the patient.: Yes Daily analysis of laboratory values and/or radiology reports with any subsequent need for medication adjustment of medical intervention for : Post surgical problems;Other  Angello Chien 05/23/2014, 4:51 PM         Lennart Pall, LCSW Social Worker Signed  Patient Care Conference Service date: 05/17/2014 10:20 AM  Inpatient RehabilitationTeam Conference and Plan of Care Update Date: 05/16/2014   Time: 2:10 PM     Patient Name: [name]       Medical Record Number: [id number]   Date of Birth: 07/13/1941 Sex: Male         Room/Bed: 4W10C/4W10C-01 Payor Info: Payor: MEDICARE / Plan: MEDICARE PART A AND B / Product Type: *No Product type* /   Admitting Diagnosis: Parkinson's   Admit Date/Time:  05/09/2014  3:44 PM Admission Comments: No comment available   Primary Diagnosis:  <principal problem not specified> Principal Problem: <principal problem not specified>    Patient Active Problem List     Diagnosis  Date Noted   .  Parkinson disease  05/09/2014   .  Fever  05/05/2014   .  Osteopenia  03/06/2014   .  Postsurgical percutaneous transluminal coronary angioplasty status  08/23/2013   .  Healed myocardial infarct  08/23/2013   .  Obstructive apnea   08/23/2013   .  Artificial cardiac pacemaker  08/23/2013   .  Unspecified vitamin D deficiency  02/03/2013   .  B12 deficiency  02/03/2013   .  Dizziness  06/10/2012   .  Dermatochalasis of eyelid  03/04/2012   .  Error, refractive, myopia  03/04/2012   .  Posterior vitreous detachment  03/04/2012   .  Pseudoaphakia  03/04/2012   .  Atrioventricular block, complete     .  Idiopathic Parkinson's disease  08/29/2011   .  Hematoma of chest wall  08/26/2011   .  Hyperlipidemia  08/22/2011   .  CAD (coronary artery disease)  08/22/2011   .  Dementia  08/22/2011   .  Atrial fibrillation  01/21/2011   .  CARDIAC PACEMAKER IN SITU  01/21/2011   .  PARKINSON'S DISEASE  11/18/2007   .  MYOCARDIAL INFARCTION  11/18/2007   .  DERMATITIS HERPETIFORMIS  11/18/2007   .  SLEEP APNEA  11/18/2007     Expected Discharge Date: Expected Discharge Date: 05/24/14  Team Members Present: Physician leading conference: Dr. Alger Simons Social Worker Present: Lennart Pall, LCSW Nurse Present: Elliot Cousin, RN PT Present: Rada Hay, Thurnell Lose, PT OT Present: Salome Spotted, OT;Patricia Lissa Hoard, OT SLP Present: Weston Anna, SLP Other (Discipline and Name): Danne Baxter, RN [facility]) PPS Coordinator present : Daiva Nakayama, RN, CRRN;Becky Alwyn Ren, PT        Current Status/Progress  Goal  Weekly Team Focus   Medical     severe PD. deconditioning, dementia  improve safety, coordination, and exercise tolerance  improve swallowing, safety, reduce tremors   Bowel/Bladder     Continent of bladder and bowel, incont at times, LBM 05/15/14  To be continnet of bladder and bowel  Timed toileting during day   Swallow/Nutrition/ Hydration     mod assist for use of swallowing precautions, upgraded to dys 3 textures per MBS yesterday  supervision with least restrictive diet   Carryover of safe swallowing strategies    ADL's     overall min>mod assist secondary to posterior lean & impulsive behaviors  overall  supervision  ADL retraining, dynamic standing, sit<>stands, functional mobility/transfers, overall activity tolerance/endurance, functional strengtening   Mobility     min-mod A due to LOBs and bowel control  supervision/set up  gait with rolator, transfers, bed mobility, ambulating community distance   Communication     mod assist for speech intelligibility   min assist   carryover of compensatory strategies    Safety/Cognition/ Behavioral Observations    mod-max assist   min assist   orientation, memory, basic problem solving, awareness   Pain     NO c/o pain  Pain level <3  Assess and treat for pain q shift   Skin     No skin issues  No skin breakdown  Assess skin q shift    Rehab Goals Patient on target to meet rehab goals: Yes *See Care Plan and progress notes for long and short-term goals.    Barriers to Discharge:  severe PD and associated symptoms      Possible Resolutions to Barriers:    adjusting regimen, adaptive equipment, family education      Discharge Planning/Teaching Needs:    home with wife to provide 24/7 assistance (has some private duty assist two days per wk)      Team Discussion:    Severe PD;  Declining in overall functioning since admit? Had been steady assistance and not concern that can reach min assist goals.  Cognition fluctuates.  SW to follow up with wife to confirm that she can provide min assist   Revisions to Treatment Plan:    None    Continued Need for Acute Rehabilitation Level of Care: The patient requires daily medical management by a physician with specialized training in physical medicine and rehabilitation for the following conditions: Daily direction of a multidisciplinary physical rehabilitation program to ensure safe treatment while eliciting the highest outcome that is of practical value to the  patient.: Yes Daily medical management of patient stability for increased activity during participation in an intensive rehabilitation regime.:  Yes Daily analysis of laboratory values and/or radiology reports with any subsequent need for medication adjustment of medical intervention for : Neurological problems;Pulmonary problems  Chyna Kneece 05/17/2014, 10:20 AM

## 2014-05-25 NOTE — Telephone Encounter (Signed)
LM for C/B. 

## 2014-05-30 NOTE — Telephone Encounter (Signed)
Unable to reach patient within time frame needed.

## 2014-06-02 NOTE — Progress Notes (Signed)
During this admission this patient met criteria for severe malnutrition related to his acute on chronic illness  [name] Staggers, MD, [place]

## 2014-06-12 ENCOUNTER — Encounter: Payer: Self-pay | Admitting: Medical

## 2014-06-12 ENCOUNTER — Telehealth: Payer: Self-pay | Admitting: Family Medicine

## 2014-06-12 NOTE — Telephone Encounter (Signed)
Caller name: Consuella Lose Relation to ZH:[id number] Call back number:[phone]   Reason for call:   Pt has dental procedure tomorrow; cleaning and filling, and the dentist wanted to know if he should be on an antibiotic.

## 2014-06-13 MED ORDER — AMOXICILLIN 500 MG PO CAPS: ORAL_CAPSULE | ORAL | Status: DC

## 2014-06-13 NOTE — Telephone Encounter (Signed)
Rx sent to [facility] on [facility] [address].  Pt and wife are aware.  No further needs voiced at this time.

## 2014-06-13 NOTE — Telephone Encounter (Signed)
[name] called back about the below notes. She would like a callback to:[phone] She states pt's dental appt is at 3:15 today

## 2014-06-13 NOTE — Telephone Encounter (Signed)
Amoxicillin 500 mg,  4 po 1 hour prior to procedure

## 2014-06-13 NOTE — Telephone Encounter (Signed)
[name] is out of the office.  Please advise.

## 2014-06-19 ENCOUNTER — Other Ambulatory Visit: Payer: Self-pay | Admitting: Cardiology

## 2014-06-26 ENCOUNTER — Ambulatory Visit (INDEPENDENT_AMBULATORY_CARE_PROVIDER_SITE_OTHER): Payer: Medicare Other | Admitting: *Deleted

## 2014-06-26 DIAGNOSIS — I442 Atrioventricular block, complete: Secondary | ICD-10-CM

## 2014-06-26 NOTE — Progress Notes (Signed)
Remote pacemaker transmission.   

## 2014-06-28 LAB — MDC_IDC_ENUM_SESS_TYPE_REMOTE
Brady Statistic AP VP Percent: 97.5 %
Lead Channel Impedance Value: 471 Ohm
Lead Channel Pacing Threshold Amplitude: 0.625 V
Lead Channel Pacing Threshold Amplitude: 1.625 V
Lead Channel Pacing Threshold Pulse Width: 0.4 ms
Lead Channel Pacing Threshold Pulse Width: 0.4 ms
Lead Channel Setting Pacing Amplitude: 2.5 V
Lead Channel Setting Pacing Amplitude: 3.5 V
Lead Channel Setting Pacing Pulse Width: 0.4 ms
Lead Channel Setting Sensing Sensitivity: 2.8 mV
MDC IDC MSMT LEADCHNL RV IMPEDANCE VALUE: 485 Ohm
MDC IDC STAT BRADY AP VS PERCENT: 0.1 %
MDC IDC STAT BRADY AS VP PERCENT: 2.5 %
MDC IDC STAT BRADY AS VS PERCENT: 0.1 %

## 2014-07-03 ENCOUNTER — Encounter: Payer: Self-pay | Admitting: Cardiology

## 2014-07-03 ENCOUNTER — Ambulatory Visit (INDEPENDENT_AMBULATORY_CARE_PROVIDER_SITE_OTHER): Payer: Medicare Other | Admitting: Cardiology

## 2014-07-03 ENCOUNTER — Other Ambulatory Visit: Payer: Medicare Other

## 2014-07-03 VITALS — BP 96/64 | HR 66 | Ht 71.0 in | Wt 167.0 lb

## 2014-07-03 DIAGNOSIS — I2583 Coronary atherosclerosis due to lipid rich plaque: Secondary | ICD-10-CM

## 2014-07-03 DIAGNOSIS — E785 Hyperlipidemia, unspecified: Secondary | ICD-10-CM

## 2014-07-03 DIAGNOSIS — I119 Hypertensive heart disease without heart failure: Secondary | ICD-10-CM

## 2014-07-03 DIAGNOSIS — I2510 Atherosclerotic heart disease of native coronary artery without angina pectoris: Secondary | ICD-10-CM

## 2014-07-03 DIAGNOSIS — Z950 Presence of cardiac pacemaker: Secondary | ICD-10-CM

## 2014-07-03 DIAGNOSIS — I259 Chronic ischemic heart disease, unspecified: Secondary | ICD-10-CM

## 2014-07-03 LAB — BASIC METABOLIC PANEL
BUN: 13 mg/dL (ref 6–23)
CO2: 31 meq/L (ref 19–32)
CREATININE: 0.8 mg/dL (ref 0.4–1.5)
Calcium: 9 mg/dL (ref 8.4–10.5)
Chloride: 104 mEq/L (ref 96–112)
GFR: 97.2 mL/min (ref >60.00)
Glucose, Bld: 89 mg/dL (ref 70–99)
Potassium: 4.4 mEq/L (ref 3.5–5.1)
Sodium: 140 mEq/L (ref 135–145)

## 2014-07-03 LAB — HEPATIC FUNCTION PANEL
ALK PHOS: 57 U/L (ref 39–117)
ALT: 5 U/L (ref 0–53)
AST: 17 U/L (ref 0–37)
Albumin: 3.6 g/dL (ref 3.5–5.2)
BILIRUBIN DIRECT: 0 mg/dL (ref 0.0–0.3)
Total Bilirubin: 0.6 mg/dL (ref 0.2–1.2)
Total Protein: 6.6 g/dL (ref 6.0–8.3)

## 2014-07-03 LAB — CBC WITH DIFFERENTIAL/PLATELET
BASOS ABS: 0 10*3/uL (ref 0.0–0.1)
BASOS PCT: 0.6 % (ref 0.0–3.0)
EOS ABS: 0.2 10*3/uL (ref 0.0–0.7)
Eosinophils Relative: 4.7 % (ref 0.0–5.0)
HCT: 39.6 % (ref 39.0–52.0)
Hemoglobin: 12.8 g/dL — ABNORMAL LOW (ref 13.0–17.0)
Lymphocytes Relative: 37 % (ref 12.0–46.0)
Lymphs Abs: 1.6 10*3/uL (ref 0.7–4.0)
MCHC: 32.2 g/dL (ref 30.0–36.0)
MCV: 85 fl (ref 78.0–100.0)
MONO ABS: 0.6 10*3/uL (ref 0.1–1.0)
Monocytes Relative: 14.6 % — ABNORMAL HIGH (ref 3.0–12.0)
NEUTROS PCT: 43.1 % (ref 43.0–77.0)
Neutro Abs: 1.8 10*3/uL (ref 1.4–7.7)
Platelets: 265 10*3/uL (ref 150.0–400.0)
RBC: 4.66 Mil/uL (ref 4.22–5.81)
RDW: 15.8 % — AB (ref 11.5–15.5)
WBC: 4.3 10*3/uL (ref 4.0–10.5)

## 2014-07-03 LAB — LIPID PANEL
Cholesterol: 145 mg/dL (ref 0–200)
HDL: 42.8 mg/dL (ref >39.00)
LDL Cholesterol: 85 mg/dL (ref 0–99)
NONHDL: 102.2
TRIGLYCERIDES: 84 mg/dL (ref 0.0–149.0)
Total CHOL/HDL Ratio: 3
VLDL: 16.8 mg/dL (ref 0.0–40.0)

## 2014-07-03 NOTE — Patient Instructions (Signed)
Will obtain labs today and call you with the results (lp/bmet/hfp/cbc)  Your physician recommends that you continue on your current medications as directed. Please refer to the Current Medication list given to you today.  Your physician recommends that you schedule a follow-up appointment in: 4 months with fasting labs (lp/bmet/hfp/cbc)  

## 2014-07-03 NOTE — Assessment & Plan Note (Signed)
The patient has not been having any recurrent chest pain or angina. 

## 2014-07-03 NOTE — Assessment & Plan Note (Signed)
The patient has a history of hyperlipidemia.  He is intolerant of statins.  He is on ezetimibe.  Laboratory results are pending

## 2014-07-03 NOTE — Progress Notes (Signed)
[name] Date of Birth:  05/13/1941 [facility] [address] [place], [place]  [address] [phone]        Fax   [phone]   History of Present Illness: This pleasant 73 year old retired [name] fund[employer] is seen for a scheduled four-month followup office visit. He has a past history of known coronary artery disease with a remote inferior wall myocardial infarction in 1986. He was treated with emergent angioplasty at that time. He had a repeat angioplasty in 2002. He had syncope in December 2011 and had long periods of asystole and has a dual-chamber pacemaker implanted since then. 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 He has severe dementia as well as severe Parkinson's and he does have a deep brain stimulator in place on the left side. He has had some falling spells. He is followed by neurology at [facility].  Since last visit he was hospitalized in June 2015 for temperature 102.  The source of the fever was uncertain.  [facility].  After discharge from rehabilitation he was seen by his neurosurgeon Dr. Angelyn Punt who repositioned the lead of the neurostimulator deeper into the chest wall because it was threatening to erode through the skin.    Current Outpatient Prescriptions  Medication Sig Dispense Refill  . amoxicillin (AMOXIL) 500 MG capsule Take 4 tablets by mouth 1 hour prior to procedure.  4 capsule  0  . Calcium Carb-Cholecalciferol (CALCIUM-VITAMIN D) 600-400 MG-UNIT TABS Take 2 tablets by mouth daily.      . carbidopa-levodopa (SINEMET) 25-100 MG per tablet Take 1 tablet by mouth at bedtime.        . Carbidopa-Levodopa-Entacapone (STALEVO 200 PO) Take 200 mg by mouth 4 (four) times daily.        . dapsone 25 MG tablet Take 50 mg by mouth every morning.       . donepezil (ARICEPT) 10 MG tablet Take 10 mg by mouth daily.        Marland Kitchen  escitalopram (LEXAPRO) 10 MG tablet Take 1 tablet (10 mg total) by mouth daily.  30 tablet  1  . Magnesium 400 MG CAPS Take 400 mg by mouth daily.  30 capsule  1  . mirabegron ER (MYRBETRIQ) 50 MG TB24 tablet Take 1 tablet (50 mg total) by mouth daily.  30 tablet  1  . nitroGLYCERIN (NITROSTAT) 0.4 MG SL tablet Place 0.4 mg under the tongue every 5 (five) minutes as needed for chest pain.      Marland Kitchen QUEtiapine (SEROQUEL) 25 MG tablet Take 25 mg by mouth at bedtime.      . tamsulosin (FLOMAX) 0.4 MG CAPS capsule Take 1 capsule (0.4 mg total) by mouth daily.  30 capsule  1  . vitamin B-12 (CYANOCOBALAMIN) 1000 MCG tablet Take 1 tablet (1,000 mcg total) by mouth daily.  30 tablet  1  . ZETIA 10 MG tablet TAKE 1 TABLET (10 MG TOTAL) BY MOUTH DAILY.  90 tablet  0   No current facility-administered medications for this visit.    Allergies  Allergen Reactions  . Crestor [Rosuvastatin Calcium] Other (See Comments)    Muscle weakness   . Rosuvastatin Dermatitis    Muscle weakness   . Bactrim Rash  . Gluten Rash  . Sulfamethoxazole-Trimethoprim Rash    Patient Active Problem List   Diagnosis Date Noted  . Parkinson disease 05/09/2014  .  Fever 05/05/2014  . Osteopenia 03/06/2014  . Postsurgical percutaneous transluminal coronary angioplasty status 08/23/2013  . Healed myocardial infarct 08/23/2013  . Obstructive apnea 08/23/2013  . Artificial cardiac pacemaker 08/23/2013  . Unspecified vitamin D deficiency 02/03/2013  . B12 deficiency 02/03/2013  . Dizziness 06/10/2012  . Dermatochalasis of eyelid 03/04/2012  . Error, refractive, myopia 03/04/2012  . Posterior vitreous detachment 03/04/2012  . Pseudoaphakia 03/04/2012  . Atrioventricular block, complete   . Idiopathic Parkinson's disease 08/29/2011  . Hematoma of chest wall 08/26/2011  . Hyperlipidemia 08/22/2011  . CAD (coronary artery disease) 08/22/2011  . Dementia 08/22/2011  . Atrial fibrillation 01/21/2011  . CARDIAC PACEMAKER IN  SITU 01/21/2011  . PARKINSON'S DISEASE 11/18/2007  . MYOCARDIAL INFARCTION 11/18/2007  . 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 SLEEP APNEA 11/18/2007    History  Smoking status  . Never Smoker   Smokeless tobacco  . Not on file    History  Alcohol Use No    Family History  Problem Relation Age of Onset  . Heart attack Mother   . Heart disease Mother   . Hypertension Mother   . Heart attack Father   . Other Father     renal calculi  . Heart disease Brother   . Heart attack Sister     Review of Systems: Constitutional: no fever chills diaphoresis or fatigue or change in weight.  Head and neck: no hearing loss, no epistaxis, no photophobia or visual disturbance. Respiratory: No cough, shortness of breath or wheezing. Cardiovascular: No chest pain peripheral edema, palpitations. Gastrointestinal: No abdominal distention, no abdominal pain, no change in bowel habits hematochezia or melena. Genitourinary: No dysuria, no frequency, no urgency, no nocturia. Musculoskeletal:No arthralgias, no back pain, no gait disturbance or myalgias. Neurological: No dizziness, no headaches, no numbness, no seizures, no syncope, no weakness, no tremors. Hematologic: No lymphadenopathy, no easy bruising. Psychiatric: No confusion, no hallucinations, no sleep disturbance.    Physical Exam: Filed Vitals:   07/03/14 0911  BP: 96/64  Pulse: 66   the general appearance reveals a well-developed well-nourished elderly gentleman in no distress.  His wife is with him and answers most of the questions.  The patient has dementia and has speech difficulty relating to his Parkinson's.The head and neck exam reveals pupils equal and reactive.  Extraocular movements are full.  There is no scleral icterus.  The mouth and pharynx are normal.  The neck is supple.  The carotids reveal no bruits.  The jugular venous pressure is normal.  The  thyroid is not enlarged.  There is no lymphadenopathy.  The chest is  clear to percussion and auscultation.  There are no rales or rhonchi.  Expansion of the chest is symmetrical.  The precordium is quiet.  The first heart sound is normal.  The second heart sound is physiologically split.  There is no murmur gallop rub or click.  There is no abnormal lift or heave.  The abdomen is soft and nontender.  The bowel sounds are normal.  The liver and spleen are not enlarged.  There are no abdominal masses.  There are no abdominal bruits.  Extremities reveal good pedal pulses.  There is no phlebitis or edema.  There is no cyanosis or clubbing.  Gait disturbance consistent with Parkinson's.  There are no sensory deficits.  The skin is warm and dry.  There is no rash.  He has a pacemaker in the right upper chest and a brain stimulator generator in the left upper  chest.  The incisions are well-healed.     Assessment / Plan: 1.  Ischemic heart disease with remote inferior wall myocardial infarction in 1986.  Repeat angioplasty in 2002. 2. 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 severe Parkinson's disease with deep brain stimulator, followed by neurology at [facility]. 5. severe dementia 6. history of bladder spasms followed by urology Dr. McDiarmid  Disposition: We are checking lab work today.  Recheck in 4 months for office visit CBC lipid panel hepatic function panel and basal metabolic panel.

## 2014-07-03 NOTE — Assessment & Plan Note (Signed)
The patient has had no further dizzy spells or syncope. 

## 2014-07-03 NOTE — Progress Notes (Signed)
Quick Note:  Please report to patient. The recent labs are stable. Continue same medication and careful diet. Send copy to his dermatologist Dr. Everlene Farrier. ______

## 2014-07-04 ENCOUNTER — Telehealth: Payer: Self-pay | Admitting: Cardiology

## 2014-07-04 NOTE — Telephone Encounter (Signed)
New message  ° ° °Wife calling for test results.   °

## 2014-07-04 NOTE — Telephone Encounter (Signed)
Message copied by Burnell Blanks on Tue Jul 04, 2014  5:08 PM ------      Message from: Cassell Clement      Created: Mon Jul 03, 2014  8:00 PM       Please report to patient.  The recent labs are stable. Continue same medication and careful diet. Send copy to his dermatologist Dr. Everlene Farrier. ------

## 2014-07-04 NOTE — Telephone Encounter (Signed)
Advised wife of labs 

## 2014-07-06 ENCOUNTER — Encounter: Payer: Self-pay | Admitting: Cardiology

## 2014-07-13 ENCOUNTER — Encounter: Payer: Self-pay | Admitting: Internal Medicine

## 2014-09-20 ENCOUNTER — Other Ambulatory Visit: Payer: Self-pay | Admitting: Cardiology

## 2014-09-27 ENCOUNTER — Ambulatory Visit (INDEPENDENT_AMBULATORY_CARE_PROVIDER_SITE_OTHER): Payer: Medicare Other | Admitting: *Deleted

## 2014-09-27 DIAGNOSIS — I442 Atrioventricular block, complete: Secondary | ICD-10-CM

## 2014-09-27 NOTE — Progress Notes (Signed)
Remote pacemaker transmission.   

## 2014-09-28 LAB — MDC_IDC_ENUM_SESS_TYPE_REMOTE
Battery Remaining Longevity: 36 mo
Brady Statistic AP VP Percent: 98 %
Brady Statistic AP VS Percent: 0 %
Brady Statistic AS VP Percent: 2 %
Brady Statistic AS VS Percent: 0 %
Date Time Interrogation Session: 20151118153204
Lead Channel Impedance Value: 484 Ohm
Lead Channel Impedance Value: 500 Ohm
Lead Channel Pacing Threshold Amplitude: 0.625 V
Lead Channel Pacing Threshold Amplitude: 1.875 V
Lead Channel Pacing Threshold Pulse Width: 0.4 ms
Lead Channel Pacing Threshold Pulse Width: 0.4 ms
MDC IDC MSMT BATTERY IMPEDANCE: 1060 Ohm
MDC IDC MSMT BATTERY VOLTAGE: 2.76 V
MDC IDC SET LEADCHNL RA PACING AMPLITUDE: 3.75 V
MDC IDC SET LEADCHNL RV PACING AMPLITUDE: 2.5 V
MDC IDC SET LEADCHNL RV PACING PULSEWIDTH: 0.4 ms
MDC IDC SET LEADCHNL RV SENSING SENSITIVITY: 2.8 mV

## 2014-10-04 ENCOUNTER — Encounter: Payer: Self-pay | Admitting: Cardiology

## 2014-10-12 ENCOUNTER — Telehealth: Payer: Self-pay | Admitting: Family Medicine

## 2014-10-12 NOTE — Telephone Encounter (Signed)
Pt wife notified.

## 2014-10-12 NOTE — Telephone Encounter (Signed)
Caller name: Consuella Lose Relation to pt: wife Call back number: [phone] [facility] [address]  Reason for call:   Patient will be having a teeth cleaning on Monday and would like to know if he needs an antibiotic

## 2014-10-12 NOTE — Telephone Encounter (Signed)
Pt does not need antibiotic prior to routine cleaning b/c he doesn't have a history of heart valve problems

## 2014-10-19 ENCOUNTER — Encounter: Payer: Self-pay | Admitting: Internal Medicine

## 2014-11-06 ENCOUNTER — Ambulatory Visit (INDEPENDENT_AMBULATORY_CARE_PROVIDER_SITE_OTHER): Payer: Medicare Other | Admitting: Cardiology

## 2014-11-06 ENCOUNTER — Other Ambulatory Visit: Payer: Medicare Other

## 2014-11-06 ENCOUNTER — Encounter: Payer: Self-pay | Admitting: Cardiology

## 2014-11-06 VITALS — BP 112/66 | HR 76 | Ht 71.0 in | Wt 170.0 lb

## 2014-11-06 DIAGNOSIS — I259 Chronic ischemic heart disease, unspecified: Secondary | ICD-10-CM

## 2014-11-06 DIAGNOSIS — I119 Hypertensive heart disease without heart failure: Secondary | ICD-10-CM

## 2014-11-06 DIAGNOSIS — E785 Hyperlipidemia, unspecified: Secondary | ICD-10-CM

## 2014-11-06 NOTE — Patient Instructions (Signed)
Will obtain labs today and call you with the results (LP/BMET/HFP/CBC) [facility]  Your physician recommends that you continue on your current medications as directed. Please refer to the Current Medication list given to you today.  Your physician wants you to follow-up in: 4 months with fasting labs (lp/bmet/hfp/CBC) You will receive a reminder letter in the mail two months in advance. If you don't receive a letter, please call our office to schedule the follow-up appointment.

## 2014-11-06 NOTE — Progress Notes (Signed)
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 He has a past history of known coronary artery disease with a remote inferior wall myocardial infarction in 1986. He was treated with emergent angioplasty at that time. He had a repeat angioplasty in 2002. He had syncope in December 2011 and had long periods of asystole and has a dual-chamber pacemaker implanted since then. 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 Danella DeisGruber. He has severe dementia as well as severe Parkinson's and he does have a deep brain stimulator in place on the left side. He has had some falling spells. He is [facility].  Since we last saw him he was [facility] for repositioning of the deep brain stimulator battery which eroded through the skin in the left lower quadrant of the old incision.  The erosion was recognized promptly and there was no evidence of any pocket infection. Since last visit the patient has not been having any chest pain.  No dizziness or syncope.  He has sleep apnea and uses a CPAP machine successfully. He has been having problems with constipation and drinks prune juice which has helped He remains on a gluten-free diet.    Current Outpatient Prescriptions  Medication Sig Dispense Refill  . Calcium Carb-Cholecalciferol (CALCIUM-VITAMIN D) 600-400 MG-UNIT TABS Take 2 tablets by mouth daily.    . carbidopa-levodopa (SINEMET) 25-100 MG per tablet Take 1 tablet by mouth at bedtime.      . Carbidopa-Levodopa-Entacapone (STALEVO 200 PO) Take 200 mg by mouth 4 (four) times daily.      . dapsone 25 MG tablet Take 50 mg by mouth every morning.     . donepezil (ARICEPT) 10  MG tablet Take 10 mg by mouth daily.      Marland Kitchen. escitalopram (LEXAPRO) 10 MG tablet Take 1 tablet (10 mg total) by mouth daily. 30 tablet 1  . Magnesium 400 MG CAPS Take 400 mg by mouth daily. 30 capsule 1  . mirabegron ER (MYRBETRIQ) 50 MG TB24 tablet Take 1 tablet (50 mg total) by mouth daily. 30 tablet 1  . nitroGLYCERIN (NITROSTAT) 0.4 MG SL tablet Place 0.4 mg under the tongue every 5 (five) minutes as needed for chest pain.    Marland Kitchen. QUEtiapine (SEROQUEL) 25 MG tablet Take 25 mg by mouth at bedtime.    . tamsulosin (FLOMAX) 0.4 MG CAPS capsule Take 1 capsule (0.4 mg total) by mouth daily. 30 capsule 1  . vitamin B-12 (CYANOCOBALAMIN) 1000 MCG tablet Take 1 tablet (1,000 mcg total) by mouth daily. (Patient taking differently: Take 5,000 mcg by mouth daily. ) 30 tablet 1  . vitamin C (ASCORBIC ACID) 500 MG tablet Take 500 mg by mouth 2 (two) times daily.    Marland Kitchen. ZETIA 10 MG tablet TAKE 1 TABLET BY MOUTH ONCE DAILY 90 tablet 0  . zinc gluconate 50 MG tablet Take 50 mg by mouth daily.     No current facility-administered medications for this visit.    Allergies  Allergen Reactions  . Crestor [Rosuvastatin Calcium] Other (See Comments)    Muscle weakness   . Rosuvastatin Dermatitis    Muscle weakness   . Bactrim  Rash  . Gluten Rash  . Sulfamethoxazole-Trimethoprim Rash    Patient Active Problem List   Diagnosis Date Noted  . Parkinson disease 05/09/2014  . Fever 05/05/2014  . Osteopenia 03/06/2014  . Postsurgical percutaneous transluminal coronary angioplasty status 08/23/2013  . Healed myocardial infarct 08/23/2013  . Obstructive apnea 08/23/2013  . Artificial cardiac pacemaker 08/23/2013  . Unspecified vitamin D deficiency 02/03/2013  . B12 deficiency 02/03/2013  . Dizziness 06/10/2012  . Dermatochalasis of eyelid 03/04/2012  . Error, refractive, myopia 03/04/2012  . Posterior vitreous detachment 03/04/2012  . Pseudoaphakia 03/04/2012  . Atrioventricular block, complete   .  Idiopathic Parkinson's disease 08/29/2011  . Hematoma of chest wall 08/26/2011  . Hyperlipidemia 08/22/2011  . CAD (coronary artery disease) 08/22/2011  . Dementia 08/22/2011  . Atrial fibrillation 01/21/2011  . CARDIAC PACEMAKER IN SITU 01/21/2011  . PARKINSON'S DISEASE 11/18/2007  . MYOCARDIAL INFARCTION 11/18/2007  . 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 SLEEP APNEA 11/18/2007    History  Smoking status  . Never Smoker   Smokeless tobacco  . Not on file    History  Alcohol Use No    Family History  Problem Relation Age of Onset  . Heart attack Mother   . Heart disease Mother   . Hypertension Mother   . Heart attack Father   . Other Father     renal calculi  . Heart disease Brother   . Heart attack Sister     Review of Systems: Constitutional: no fever chills diaphoresis or fatigue or change in weight.  Head and neck: no hearing loss, no epistaxis, no photophobia or visual disturbance. Respiratory: No cough, shortness of breath or wheezing. Cardiovascular: No chest pain peripheral edema, palpitations. Gastrointestinal: No abdominal distention, no abdominal pain, no change in bowel habits hematochezia or melena. Genitourinary: No dysuria, no frequency, no urgency, no nocturia. Musculoskeletal:No arthralgias, no back pain, no gait disturbance or myalgias. Neurological: No dizziness, no headaches, no numbness, no seizures, no syncope, no weakness, no tremors. Hematologic: No lymphadenopathy, no easy bruising. Psychiatric: No confusion, no hallucinations, no sleep disturbance.    Physical Exam: Filed Vitals:   11/06/14 0933  BP: 112/66  Pulse: 76   the general appearance reveals a well-developed well-nourished elderly gentleman in no distress.  His wife is with him and answers most of the questions.  The patient has dementia and has speech difficulty relating to his Parkinson's.The head and neck exam reveals pupils equal and reactive.  Extraocular movements are  full.  There is no scleral icterus.  The mouth and pharynx are normal.  The neck is supple.  The carotids reveal no bruits.  The jugular venous pressure is normal.  The  thyroid is not enlarged.  There is no lymphadenopathy.  The chest is clear to percussion and auscultation.  There are no rales or rhonchi.  Expansion of the chest is symmetrical.  The precordium is quiet.  The first heart sound is normal.  The second heart sound is physiologically split.  There is no murmur gallop rub or click.  There is no abnormal lift or heave.  The abdomen is soft and nontender.  The bowel sounds are normal.  The liver and spleen are not enlarged.  There are no abdominal masses.  There are no abdominal bruits.  Extremities reveal good pedal pulses.  There is no phlebitis or edema.  There is no cyanosis or clubbing.  Gait disturbance consistent with Parkinson's.  There are no sensory deficits.  The skin  is warm and dry.  There is no rash.  He has a pacemaker in the right upper chest and a brain stimulator generator in the left upper chest.  The incisions are well-healed.  There is no evidence of any threatened erosion of the battery through the skin.     Assessment / Plan: 1.  Ischemic heart disease with remote inferior wall myocardial infarction in 1986.  Repeat angioplasty in 2002. 2. 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 severe Parkinson's disease with deep brain stimulator, followed by neurology at [facility]  Recent repositioning of the battery pack because of erosion through the skin. 5. severe dementia 6. history of bladder spasms followed by urology Dr. McDiarmid 7.  Sleep apnea, uses CPAP  Disposition: We are checking lab work today.  Recheck in 4 months for office visit CBC lipid panel hepatic function panel and basal metabolic panel. Continue current medication.

## 2014-11-07 ENCOUNTER — Telehealth: Payer: Self-pay | Admitting: *Deleted

## 2014-11-07 LAB — CBC WITH DIFFERENTIAL/PLATELET
BASOS ABS: 0 10*3/uL (ref 0.0–0.2)
Basos: 1 %
Eos: 4 %
Eosinophils Absolute: 0.2 10*3/uL (ref 0.0–0.4)
HCT: 39.9 % (ref 37.5–51.0)
Hemoglobin: 12.8 g/dL (ref 12.6–17.7)
IMMATURE GRANULOCYTES: 0 %
Immature Grans (Abs): 0 10*3/uL (ref 0.0–0.1)
LYMPHS ABS: 1.7 10*3/uL (ref 0.7–3.1)
Lymphs: 32 %
MCH: 27.4 pg (ref 26.6–33.0)
MCHC: 32.1 g/dL (ref 31.5–35.7)
MCV: 85 fL (ref 79–97)
MONOCYTES: 13 %
MONOS ABS: 0.7 10*3/uL (ref 0.1–0.9)
Neutrophils Absolute: 2.6 10*3/uL (ref 1.4–7.0)
Neutrophils Relative %: 50 %
RBC: 4.67 x10E6/uL (ref 4.14–5.80)
RDW: 14.4 % (ref 12.3–15.4)
WBC: 5.3 10*3/uL (ref 3.4–10.8)

## 2014-11-07 LAB — LIPID PANEL
Chol/HDL Ratio: 3.1 ratio units (ref 0.0–5.0)
Cholesterol, Total: 150 mg/dL (ref 100–199)
HDL: 49 mg/dL (ref >39)
LDL Calculated: 87 mg/dL (ref 0–99)
TRIGLYCERIDES: 72 mg/dL (ref 0–149)
VLDL CHOLESTEROL CAL: 14 mg/dL (ref 5–40)

## 2014-11-07 LAB — BASIC METABOLIC PANEL
BUN/Creatinine Ratio: 20 (ref 10–22)
BUN: 15 mg/dL (ref 8–27)
CO2: 25 mmol/L (ref 18–29)
Calcium: 9.4 mg/dL (ref 8.6–10.2)
Chloride: 102 mmol/L (ref 97–108)
Creatinine, Ser: 0.74 mg/dL — ABNORMAL LOW (ref 0.76–1.27)
GFR calc Af Amer: 104 mL/min/{1.73_m2} (ref >59)
GFR calc non Af Amer: 90 mL/min/{1.73_m2} (ref >59)
GLUCOSE: 91 mg/dL (ref 65–99)
POTASSIUM: 5 mmol/L (ref 3.5–5.2)
Sodium: 141 mmol/L (ref 134–144)

## 2014-11-07 LAB — HEPATIC FUNCTION PANEL
ALBUMIN: 4.2 g/dL (ref 3.5–4.8)
ALT: 5 IU/L (ref 0–44)
AST: 13 IU/L (ref 0–40)
Alkaline Phosphatase: 75 IU/L (ref 39–117)
Bilirubin, Direct: 0.13 mg/dL (ref 0.00–0.40)
TOTAL PROTEIN: 6.7 g/dL (ref 6.0–8.5)
Total Bilirubin: 0.5 mg/dL (ref 0.0–1.2)

## 2014-11-07 NOTE — Telephone Encounter (Signed)
[name] cb and has been made aware of lab results for pt, DPR on file. Wife asked for me to send results to Dr. Campbell Stall.

## 2014-11-26 ENCOUNTER — Other Ambulatory Visit: Payer: Self-pay | Admitting: Cardiology

## 2014-12-11 ENCOUNTER — Ambulatory Visit: Payer: Medicare Other | Admitting: Family

## 2014-12-27 ENCOUNTER — Ambulatory Visit (INDEPENDENT_AMBULATORY_CARE_PROVIDER_SITE_OTHER): Payer: Medicare Other | Admitting: Internal Medicine

## 2014-12-27 ENCOUNTER — Encounter: Payer: Self-pay | Admitting: Internal Medicine

## 2014-12-27 VITALS — BP 110/80 | HR 80 | Ht 71.0 in | Wt 174.0 lb

## 2014-12-27 DIAGNOSIS — R001 Bradycardia, unspecified: Secondary | ICD-10-CM

## 2014-12-27 DIAGNOSIS — Z950 Presence of cardiac pacemaker: Secondary | ICD-10-CM

## 2014-12-27 DIAGNOSIS — G20 Parkinson's disease: Secondary | ICD-10-CM

## 2014-12-27 DIAGNOSIS — G20A1 Parkinson's disease without dyskinesia, without mention of fluctuations: Secondary | ICD-10-CM

## 2014-12-27 DIAGNOSIS — Z45018 Encounter for adjustment and management of other part of cardiac pacemaker: Secondary | ICD-10-CM

## 2014-12-27 DIAGNOSIS — I480 Paroxysmal atrial fibrillation: Secondary | ICD-10-CM

## 2014-12-27 LAB — MDC_IDC_ENUM_SESS_TYPE_INCLINIC
Battery Voltage: 2.75 V
Brady Statistic AP VP Percent: 97 %
Brady Statistic AP VS Percent: 0 %
Brady Statistic AS VP Percent: 3 %
Brady Statistic AS VS Percent: 0 %
Lead Channel Impedance Value: 497 Ohm
Lead Channel Pacing Threshold Amplitude: 0.75 V
Lead Channel Pacing Threshold Amplitude: 1 V
Lead Channel Pacing Threshold Pulse Width: 0.4 ms
Lead Channel Sensing Intrinsic Amplitude: 11.2 mV
Lead Channel Sensing Intrinsic Amplitude: 2 mV
Lead Channel Setting Pacing Amplitude: 2 V
MDC IDC MSMT BATTERY IMPEDANCE: 1168 Ohm
MDC IDC MSMT BATTERY REMAINING LONGEVITY: 33 mo
MDC IDC MSMT LEADCHNL RA IMPEDANCE VALUE: 465 Ohm
MDC IDC MSMT LEADCHNL RA PACING THRESHOLD PULSEWIDTH: 0.64 ms
MDC IDC SESS DTM: 20160217095805
MDC IDC SET LEADCHNL RV PACING AMPLITUDE: 2.5 V
MDC IDC SET LEADCHNL RV PACING PULSEWIDTH: 0.4 ms
MDC IDC SET LEADCHNL RV SENSING SENSITIVITY: 2.8 mV

## 2014-12-27 NOTE — Assessment & Plan Note (Signed)
His Medtronic dual-chamber pacemaker is working normally. We'll plan to recheck in several months. 

## 2014-12-27 NOTE — Patient Instructions (Signed)
Your physician recommends that you continue on your current medications as directed. Please refer to the Current Medication list given to you today.  Remote monitoring is used to monitor your Pacemaker of ICD from home. This monitoring reduces the number of office visits required to check your device to one time per year. It allows us to keep an eye on the functioning of your device to ensure it is working properly. You are scheduled for a device check from home on 03/28/15. You may send your transmission at any time that day. If you have a wireless device, the transmission will be sent automatically. After your physician reviews your transmission, you will receive a postcard with your next transmission date.  Your physician wants you to follow-up in: 1 year with Dr. Taylor.  You will receive a reminder letter in the mail two months in advance. If you don't receive a letter, please call our office to schedule the follow-up appointment.  

## 2014-12-27 NOTE — Assessment & Plan Note (Signed)
According to his spouse, he is scheduled for revision and replacement of his neural stimulator in the next few months.

## 2014-12-27 NOTE — Progress Notes (Signed)
HPI [name] returns today for followup. He is a very pleasant 75 year old man with a history of symptomatic bradycardia, status post permanent pacemaker insertion, history of Parkinson's disease, with a neural stimulator in place. In the interim, he has been stable. He does have a mild tremor which is fairly well controlled. He had to undergo revision of his neural stimulator and he is healing nicely. Allergies  Allergen Reactions  . Crestor [Rosuvastatin Calcium] Other (See Comments)    Muscle weakness   . Rosuvastatin Dermatitis    Muscle weakness   . Bactrim Rash  . Gluten Rash  . Sulfamethoxazole-Trimethoprim Rash     Current Outpatient Prescriptions  Medication Sig Dispense Refill  . Calcium Carb-Cholecalciferol (CALCIUM-VITAMIN D) 600-400 MG-UNIT TABS Take 2 tablets by mouth daily.    . carbidopa-levodopa (SINEMET) 25-100 MG per tablet Take 1 tablet by mouth at bedtime.      . Carbidopa-Levodopa-Entacapone (STALEVO 200 PO) Take 200 mg by mouth 4 (four) times daily.      . dapsone 25 MG tablet Take 50 mg by mouth every morning.     . donepezil (ARICEPT) 10 MG tablet Take 10 mg by mouth daily.      Marland Kitchen escitalopram (LEXAPRO) 10 MG tablet Take 1 tablet (10 mg total) by mouth daily. 30 tablet 1  . Magnesium 400 MG CAPS Take 400 mg by mouth daily. 30 capsule 1  . mirabegron ER (MYRBETRIQ) 50 MG TB24 tablet Take 1 tablet (50 mg total) by mouth daily. 30 tablet 1  . nitroGLYCERIN (NITROSTAT) 0.4 MG SL tablet Place 0.4 mg under the tongue every 5 (five) minutes as needed for chest pain.    Marland Kitchen QUEtiapine (SEROQUEL) 25 MG tablet Take 25 mg by mouth at bedtime.    . tamsulosin (FLOMAX) 0.4 MG CAPS capsule Take 1 capsule (0.4 mg total) by mouth daily. 30 capsule 1  . vitamin B-12 (CYANOCOBALAMIN) 1000 MCG tablet Take 1 tablet (1,000 mcg total) by mouth daily. (Patient taking differently: Take 5,000 mcg by mouth daily. ) 30 tablet 1  . vitamin C (ASCORBIC ACID) 500 MG tablet Take 500 mg by mouth  2 (two) times daily.    Marland Kitchen ZETIA 10 MG tablet TAKE 1 TABLET BY MOUTH DAILY 90 tablet 0  . zinc gluconate 50 MG tablet Take 50 mg by mouth daily.     No current facility-administered medications for this visit.     Past Medical History  Diagnosis Date  . Parkinson disease   . Bradycardia     syncope due to prolonged pauses, resolved with PPM  . Sleep apnea   . Hypertension   . Dermatitis   . Depression   . Rib fractures 2011    S/p fall  . Maxillary fracture 2009    Right  . BPH (benign prostatic hypertrophy)   . Coronary artery disease 1986    Inferior wall MI treated with Angioplasty  . Normal echocardiogram 2007    Mild LVH with Impaired relaxation, Mild-Mod. Aortic Root dilation, Mild Aortic Sclerosis, Mild MR, Mild TR, Ef-50-55%  . Normal nuclear stress test 2006    Normal EF-54%  . CAD (coronary artery disease)   . Inferior MI   . 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 Maxillary fracture 2009    Right side  . Rib fracture 07/2010  . Atrioventricular block, complete   . Pacemaker   . H/O hiatal hernia     ROS:   All systems reviewed  and negative except as noted in the HPI.   Past Surgical History  Procedure Laterality Date  . Pacemaker insertion  10/28/10    MDT implanted by Dr Ladona Ridgel  . Deep brain stimulator placement      Parkinsons  . Arthroscopic knee      Left knee  . Cataract surgery      Bilateral  . Inguinal hernia repair      Bilateral  . Cardiac catheterization      Multiple Angioplasties, last 2002  . Removal of ganglion cyst      right wrist  . Insertion / placement / revision neurostimulator    . Eye surgery    . Insert / replace / remove pacemaker       Family History  Problem Relation Age of Onset  . Heart attack Mother   . Heart disease Mother   . Hypertension Mother   . Heart attack Father   . Other Father     renal calculi  . Heart disease Brother   . Heart attack Sister      History   Social History  . Marital  Status: Married    Spouse Name: N/A  . Number of Children: N/A  . Years of Education: N/A   Occupational History  . Not on file.   Social History Main Topics  . Smoking status: Never Smoker   . Smokeless tobacco: Not on file  . Alcohol Use: No  . Drug Use: No  . Sexual Activity: Not Currently   Other Topics Concern  . Not on file   Social History Narrative     BP 110/80 mmHg  Pulse 80  Ht 5\' 11"  (1.803 m)  Wt 174 lb (78.926 kg)  BMI 24.28 kg/m2  Physical Exam:  Well appearing, blunted affect, mild to moderate dyskinesia, NAD, minimal tremor HEENT: Unremarkable Neck:  7 cm JVD, no thyromegally Lungs:  Clear with no wheezes, rales, or rhonchi. HEART:  Regular rate rhythm, no murmurs, no rubs, no clicks Abd:  soft, positive bowel sounds, no organomegally, no rebound, no guarding Ext:  2 plus pulses, no edema, no cyanosis, no clubbing Skin:  No rashes no nodules Neuro:  CN II through XII intact, motor grossly intact    DEVICE  Normal device function.  See PaceArt for details.   Assess/Plan:

## 2014-12-27 NOTE — Assessment & Plan Note (Signed)
He is maintaining NSR approx. 99.9% of the time. No change.

## 2015-01-14 ENCOUNTER — Inpatient Hospital Stay (HOSPITAL_COMMUNITY)
Admission: EM | Admit: 2015-01-14 | Discharge: 2015-01-17 | DRG: 177 | Disposition: A | Discharge status: Home / Self Care | Payer: Medicare Other | Attending: Internal Medicine | Admitting: Internal Medicine

## 2015-01-14 ENCOUNTER — Encounter (HOSPITAL_COMMUNITY): Payer: Self-pay

## 2015-01-14 ENCOUNTER — Emergency Department (HOSPITAL_COMMUNITY): Payer: Medicare Other

## 2015-01-14 DIAGNOSIS — I4891 Unspecified atrial fibrillation: Secondary | ICD-10-CM | POA: Diagnosis present

## 2015-01-14 DIAGNOSIS — K5900 Constipation, unspecified: Secondary | ICD-10-CM

## 2015-01-14 DIAGNOSIS — Z9842 Cataract extraction status, left eye: Secondary | ICD-10-CM

## 2015-01-14 DIAGNOSIS — R05 Cough: Secondary | ICD-10-CM

## 2015-01-14 DIAGNOSIS — G20 Parkinson's disease: Secondary | ICD-10-CM | POA: Diagnosis present

## 2015-01-14 DIAGNOSIS — Z950 Presence of cardiac pacemaker: Secondary | ICD-10-CM | POA: Diagnosis present

## 2015-01-14 DIAGNOSIS — I2510 Atherosclerotic heart disease of native coronary artery without angina pectoris: Secondary | ICD-10-CM | POA: Diagnosis present

## 2015-01-14 DIAGNOSIS — R404 Transient alteration of awareness: Secondary | ICD-10-CM

## 2015-01-14 DIAGNOSIS — R4182 Altered mental status, unspecified: Secondary | ICD-10-CM | POA: Diagnosis present

## 2015-01-14 DIAGNOSIS — G20A1 Parkinson's disease without dyskinesia, without mention of fluctuations: Secondary | ICD-10-CM | POA: Diagnosis present

## 2015-01-14 DIAGNOSIS — G4733 Obstructive sleep apnea (adult) (pediatric): Secondary | ICD-10-CM | POA: Diagnosis present

## 2015-01-14 DIAGNOSIS — R509 Fever, unspecified: Secondary | ICD-10-CM | POA: Diagnosis not present

## 2015-01-14 DIAGNOSIS — I442 Atrioventricular block, complete: Secondary | ICD-10-CM | POA: Diagnosis present

## 2015-01-14 DIAGNOSIS — N400 Enlarged prostate without lower urinary tract symptoms: Secondary | ICD-10-CM | POA: Diagnosis present

## 2015-01-14 DIAGNOSIS — Z9841 Cataract extraction status, right eye: Secondary | ICD-10-CM

## 2015-01-14 DIAGNOSIS — L130 Dermatitis herpetiformis: Secondary | ICD-10-CM | POA: Diagnosis present

## 2015-01-14 DIAGNOSIS — F0150 Vascular dementia without behavioral disturbance: Secondary | ICD-10-CM | POA: Diagnosis present

## 2015-01-14 DIAGNOSIS — Z888 Allergy status to other drugs, medicaments and biological substances status: Secondary | ICD-10-CM

## 2015-01-14 DIAGNOSIS — Z7982 Long term (current) use of aspirin: Secondary | ICD-10-CM

## 2015-01-14 DIAGNOSIS — E785 Hyperlipidemia, unspecified: Secondary | ICD-10-CM | POA: Diagnosis present

## 2015-01-14 DIAGNOSIS — R1310 Dysphagia, unspecified: Secondary | ICD-10-CM | POA: Diagnosis present

## 2015-01-14 DIAGNOSIS — F0390 Unspecified dementia without behavioral disturbance: Secondary | ICD-10-CM | POA: Diagnosis present

## 2015-01-14 DIAGNOSIS — J690 Pneumonitis due to inhalation of food and vomit: Secondary | ICD-10-CM | POA: Diagnosis not present

## 2015-01-14 DIAGNOSIS — F329 Major depressive disorder, single episode, unspecified: Secondary | ICD-10-CM | POA: Diagnosis present

## 2015-01-14 DIAGNOSIS — Z886 Allergy status to analgesic agent status: Secondary | ICD-10-CM

## 2015-01-14 DIAGNOSIS — I252 Old myocardial infarction: Secondary | ICD-10-CM

## 2015-01-14 DIAGNOSIS — G4730 Sleep apnea, unspecified: Secondary | ICD-10-CM | POA: Diagnosis present

## 2015-01-14 DIAGNOSIS — Z66 Do not resuscitate: Secondary | ICD-10-CM | POA: Diagnosis present

## 2015-01-14 DIAGNOSIS — R059 Cough, unspecified: Secondary | ICD-10-CM

## 2015-01-14 DIAGNOSIS — I10 Essential (primary) hypertension: Secondary | ICD-10-CM | POA: Diagnosis present

## 2015-01-14 DIAGNOSIS — G9340 Encephalopathy, unspecified: Secondary | ICD-10-CM

## 2015-01-14 DIAGNOSIS — R5081 Fever presenting with conditions classified elsewhere: Secondary | ICD-10-CM

## 2015-01-14 DIAGNOSIS — Z961 Presence of intraocular lens: Secondary | ICD-10-CM | POA: Diagnosis present

## 2015-01-14 LAB — COMPREHENSIVE METABOLIC PANEL
ALBUMIN: 4 g/dL (ref 3.5–5.2)
ALK PHOS: 67 U/L (ref 39–117)
ALT: 21 U/L (ref 0–53)
AST: 27 U/L (ref 0–37)
Anion gap: 4 — ABNORMAL LOW (ref 5–15)
BUN: 16 mg/dL (ref 6–23)
CHLORIDE: 103 mmol/L (ref 96–112)
CO2: 27 mmol/L (ref 19–32)
Calcium: 8.7 mg/dL (ref 8.4–10.5)
Creatinine, Ser: 0.74 mg/dL (ref 0.50–1.35)
GFR calc Af Amer: >90 mL/min (ref >90)
GFR, EST NON AFRICAN AMERICAN: 87 mL/min — AB (ref >90)
GLUCOSE: 108 mg/dL — AB (ref 70–99)
POTASSIUM: 4 mmol/L (ref 3.5–5.1)
Sodium: 134 mmol/L — ABNORMAL LOW (ref 135–145)
TOTAL PROTEIN: 6.8 g/dL (ref 6.0–8.3)
Total Bilirubin: 0.8 mg/dL (ref 0.3–1.2)

## 2015-01-14 LAB — URINALYSIS, ROUTINE W REFLEX MICROSCOPIC
Bilirubin Urine: NEGATIVE
GLUCOSE, UA: NEGATIVE mg/dL
HGB URINE DIPSTICK: NEGATIVE
Ketones, ur: 15 mg/dL — AB
LEUKOCYTES UA: NEGATIVE
Nitrite: NEGATIVE
Protein, ur: NEGATIVE mg/dL
Specific Gravity, Urine: 1.026 (ref 1.005–1.030)
Urobilinogen, UA: 0.2 mg/dL (ref 0.0–1.0)
pH: 7.5 (ref 5.0–8.0)

## 2015-01-14 LAB — CBC
HCT: 38.3 % — ABNORMAL LOW (ref 39.0–52.0)
HEMOGLOBIN: 12.3 g/dL — AB (ref 13.0–17.0)
MCH: 27.3 pg (ref 26.0–34.0)
MCHC: 32.1 g/dL (ref 30.0–36.0)
MCV: 84.9 fL (ref 78.0–100.0)
PLATELETS: 322 10*3/uL (ref 150–400)
RBC: 4.51 MIL/uL (ref 4.22–5.81)
RDW: 15.3 % (ref 11.5–15.5)
WBC: 8.6 10*3/uL (ref 4.0–10.5)

## 2015-01-14 MED ORDER — ACETAMINOPHEN 650 MG RE SUPP
650.0000 mg | Freq: Once | RECTAL | Status: AC
  Administered 2015-01-14: 650 mg via RECTAL
  Filled 2015-01-14: qty 1

## 2015-01-14 MED ORDER — SODIUM CHLORIDE 0.9 % IV BOLUS (SEPSIS)
500.0000 mL | Freq: Once | INTRAVENOUS | Status: AC
  Administered 2015-01-14: 500 mL via INTRAVENOUS

## 2015-01-14 NOTE — ED Notes (Signed)
Bed: WA09 Expected date: 01/14/15 Expected time: 7:03 PM Means of arrival:  Comments: Parkinson with increased weakness

## 2015-01-14 NOTE — ED Provider Notes (Signed)
CSN: [id number]     Arrival date & time 01/14/15  1925 History   First MD Initiated Contact with Patient 01/14/15 2137     Chief Complaint  Patient presents with  . Fever  . Altered Mental Status     (Consider location/radiation/quality/duration/timing/severity/associated sxs/prior Treatment) Patient is a 74 y.o. male presenting with fever and altered mental status. The history is provided by the patient.  Fever Associated symptoms: confusion and cough   Associated symptoms: no chest pain, no diarrhea, no dysuria, no headaches, no nausea, no rhinorrhea and no vomiting   Altered Mental Status Presenting symptoms: confusion   Severity:  Mild Most recent episode:  Yesterday Episode history:  Continuous Duration:  1 day Timing:  Constant Progression:  Unchanged Chronicity:  New Context comment:  Fever Associated symptoms: no abdominal pain, no fever, no headaches, no nausea and no vomiting     Past Medical History  Diagnosis Date  . Parkinson disease   . Bradycardia     syncope due to prolonged pauses, resolved with PPM  . Sleep apnea   . Hypertension   . Dermatitis   . Depression   . Rib fractures 2011    S/p fall  . Maxillary fracture 2009    Right  . BPH (benign prostatic hypertrophy)   . Coronary artery disease 1986    Inferior wall MI treated with Angioplasty  . Normal echocardiogram 2007    Mild LVH with Impaired relaxation, Mild-Mod. Aortic Root dilation, Mild Aortic Sclerosis, Mild MR, Mild TR, Ef-50-55%  . Normal nuclear stress test 2006    Normal EF-54%  . CAD (coronary artery disease)   . Inferior MI   . 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 Maxillary fracture 2009    Right side  . Rib fracture 07/2010  . Atrioventricular block, complete   . Pacemaker   . H/O hiatal hernia    Past Surgical History  Procedure Laterality Date  . Pacemaker insertion  10/28/10    MDT implanted by Dr Ladona Ridgel  . Deep brain stimulator placement      Parkinsons  .  Arthroscopic knee      Left knee  . Cataract surgery      Bilateral  . Inguinal hernia repair      Bilateral  . Cardiac catheterization      Multiple Angioplasties, last 2002  . Removal of ganglion cyst      right wrist  . Insertion / placement / revision neurostimulator    . Eye surgery    . Insert / replace / remove pacemaker     Family History  Problem Relation Age of Onset  . Heart attack Mother   . Heart disease Mother   . Hypertension Mother   . Heart attack Father   . Other Father     renal calculi  . Heart disease Brother   . Heart attack Sister    History  Substance Use Topics  . Smoking status: Never Smoker   . Smokeless tobacco: Not on file  . Alcohol Use: No    Review of Systems  Constitutional: Negative for fever.  HENT: Negative for drooling and rhinorrhea.   Eyes: Negative for pain.  Respiratory: Positive for cough. Negative for shortness of breath.   Cardiovascular: Negative for chest pain and leg swelling.  Gastrointestinal: Positive for constipation. Negative for nausea, vomiting, abdominal pain and diarrhea.  Genitourinary: Negative for dysuria and hematuria.  Musculoskeletal: Negative for gait problem and  neck pain.  Skin: Negative for color change.  Neurological: Negative for numbness and headaches.       Confusion  Hematological: Negative for adenopathy.  Psychiatric/Behavioral: Positive for confusion. Negative for behavioral problems.  All other systems reviewed and are negative.     Allergies  Crestor; Rosuvastatin; Bactrim; and Gluten  Home Medications   Prior to Admission medications   Medication Sig Start Date End Date Taking? Authorizing Provider  aspirin 81 MG tablet Take 81 mg by mouth daily.   Yes Historical Provider, MD  Calcium Carb-Cholecalciferol (CALCIUM-VITAMIN D) 600-400 MG-UNIT TABS Take 2 tablets by mouth daily.   Yes Historical Provider, MD  carbidopa-levodopa (SINEMET) 25-100 MG per tablet Take 1 tablet by mouth at  bedtime.     Yes Historical Provider, MD  carbidopa-levodopa-entacapone (STALEVO) 50-200-200 MG per tablet Take 1 tablet by mouth 3 (three) times daily.   Yes Historical Provider, MD  dapsone 25 MG tablet Take 50 mg by mouth every morning.    Yes Historical Provider, MD  donepezil (ARICEPT) 10 MG tablet Take 10 mg by mouth daily.     Yes Historical Provider, MD  escitalopram (LEXAPRO) 10 MG tablet Take 1 tablet (10 mg total) by mouth daily. 05/24/14  Yes Daniel J Angiulli, PA-C  Magnesium 400 MG CAPS Take 400 mg by mouth daily. 05/24/14  Yes Daniel J Angiulli, PA-C  mirabegron ER (MYRBETRIQ) 50 MG TB24 tablet Take 1 tablet (50 mg total) by mouth daily. 05/24/14  Yes Daniel J Angiulli, PA-C  nitroGLYCERIN (NITROSTAT) 0.4 MG SL tablet Place 0.4 mg under the tongue every 5 (five) minutes as needed for chest pain.   Yes Historical Provider, MD  QUEtiapine (SEROQUEL) 25 MG tablet Take 25 mg by mouth at bedtime.   Yes Historical Provider, MD  tamsulosin (FLOMAX) 0.4 MG CAPS capsule Take 1 capsule (0.4 mg total) by mouth daily. 05/24/14  Yes Daniel J Angiulli, PA-C  vitamin B-12 (CYANOCOBALAMIN) 1000 MCG tablet Take 1 tablet (1,000 mcg total) by mouth daily. 05/24/14  Yes Daniel J Angiulli, PA-C  vitamin C (ASCORBIC ACID) 500 MG tablet Take 500 mg by mouth 2 (two) times daily.   Yes Historical Provider, MD  ZETIA 10 MG tablet TAKE 1 TABLET BY MOUTH DAILY 11/27/14  Yes Cassell Clement, MD  zinc gluconate 50 MG tablet Take 50 mg by mouth daily.   Yes Historical Provider, MD   BP 105/88 mmHg  Pulse 62  Temp(Src) 101.8 F (38.8 C) (Rectal)  Resp 18  SpO2 96% Physical Exam  Constitutional: He appears well-developed and well-nourished.  HENT:  Head: Normocephalic and atraumatic.  Right Ear: External ear normal.  Left Ear: External ear normal.  Nose: Nose normal.  Mouth/Throat: Oropharynx is clear and moist. No oropharyngeal exudate.  Eyes: Conjunctivae and EOM are normal. Pupils are equal, round, and  reactive to light.  Neck: Normal range of motion. Neck supple.  Cardiovascular: Normal rate, regular rhythm, normal heart sounds and intact distal pulses.  Exam reveals no gallop and no friction rub.   No murmur heard. Pulmonary/Chest: Effort normal and breath sounds normal. No respiratory distress. He has no wheezes.  Abdominal: Soft. Bowel sounds are normal. He exhibits no distension. There is no tenderness. There is no rebound and no guarding.  Musculoskeletal: Normal range of motion. He exhibits no edema or tenderness.  Neurological: He is alert.  Alert and oriented 2. The patient does not know the year.  Normal strength and sensation in all extremities.  Skin:  Skin is warm and dry.  Psychiatric: He has a normal mood and affect. His behavior is normal.  Nursing note and vitals reviewed.   ED Course  Fecal disimpaction Date/Time: 01/15/2015 12:25 AM Performed by: Purvis Sheffield Authorized by: Purvis Sheffield Consent: Verbal consent obtained. Risks and benefits: risks, benefits and alternatives were discussed Consent given by: patient Required items: required blood products, implants, devices, and special equipment available Patient identity confirmed: verbally with patient, arm band, provided demographic data and hospital-assigned identification number Time out: Immediately prior to procedure a "time out" was called to verify the correct patient, procedure, equipment, support staff and site/side marked as required. Preparation: Patient was prepped and draped in the usual sterile fashion. Local anesthesia used: no Patient sedated: no Patient tolerance: Patient tolerated the procedure well with no immediate complications Comments: Rectal disimpaction performed by me. Patient had brown stool with clay consistency in the rectal vault. I disimpacted a moderate amount of stool but there is still some stool higher up that I could not remove.    (including critical care time) Labs  Review Labs Reviewed  CBC - Abnormal; Notable for the following:    Hemoglobin 12.3 (*)    HCT 38.3 (*)    All other components within normal limits  COMPREHENSIVE METABOLIC PANEL - Abnormal; Notable for the following:    Sodium 134 (*)    Glucose, Bld 108 (*)    GFR calc non Af Amer 87 (*)    Anion gap 4 (*)    All other components within normal limits  URINALYSIS, ROUTINE W REFLEX MICROSCOPIC - Abnormal; Notable for the following:    Color, Urine AMBER (*)    Ketones, ur 15 (*)    All other components within normal limits  INFLUENZA PANEL BY PCR (TYPE A & B, H1N1)  CBG MONITORING, ED    Imaging Review Dg Chest 2 View  01/14/2015   CLINICAL DATA:  Fever and prolonged cough.  EXAM: CHEST  2 VIEW  COMPARISON:  05/06/2014  FINDINGS: Right-sided dual lead pacemaker remains in place. Left-sided stimulator battery pack over the chest wall. Cardiomegaly is unchanged. Low lung volumes persist. Minimal atelectasis or scarring at the left costophrenic angle. No consolidation, large pleural effusion or pneumothorax. The bones are under mineralized, no acute osseous abnormalities are seen.  IMPRESSION: Stable cardiomegaly.  No acute pulmonary process.   Electronically Signed   By: Rubye Oaks M.D.   On: 01/14/2015 22:20   Dg Abd 2 Views  01/14/2015   CLINICAL DATA:  Constipation for 5 days, fever.  EXAM: ABDOMEN - 2 VIEW  COMPARISON:  None.  FINDINGS: No free intra-abdominal air. There is increased air throughout normal caliber small bowel loops in the central and right abdomen with a few air-fluid levels. Moderate stool throughout the colon with stool ball distending the rectum. Pelvic phleboliths are noted. Scoliotic curvature and multilevel did change is seen in the spine.  IMPRESSION: 1. Moderate stool throughout the colon with stool ball distending the rectum, can be seen in the setting of constipation. 2. Increased air throughout normal caliber small bowel loops in the central in right  abdomen, may reflect enteritis or ileus. No free air.   Electronically Signed   By: Rubye Oaks M.D.   On: 01/14/2015 22:23     EKG Interpretation   Date/Time:  Sunday January 14 2015 19:39:42 EST Ventricular Rate:  61 PR Interval:  216 QRS Duration: 188 QT Interval:  458 QTC Calculation: 461 R  Axis:   -59 Text Interpretation:  Sinus rhythm Borderline prolonged PR interval  Nonspecific IVCD with LAD LVH with secondary repolarization abnormality  Artifact in lead(s) I II aVR aVL aVF V1 V2 V3 V4 V5 V6 No significant  change since last tracing Confirmed by Rahil Passey  MD, Aalayah Riles ([id number]) on  01/14/2015 9:49:14 PM      MDM   Final diagnoses:  Cough  Constipation  Transient alteration of awareness  Other specified fever    9:50 PM 75 y.o. male w hx of parkinsons, CAD s/p pacemaker who presents with fever and confusion. His wife notes that he typically gets confused when he gets sick and she noticed some confusion last night as he wet the bed. She also notes that he has had some constipation over the last 5 days but no vomiting. She has tried Senokot, mag citrate, and a fleets enema without significant relief. He does have a history of constipation. He has not had any abdominal pain. His abdomen is soft and benign here. He is febrile but vital signs are otherwise unremarkable. We'll get screening labs and imaging.  Will admit to hospitalist.    Purvis Sheffield, MD 01/15/15 0030

## 2015-01-14 NOTE — ED Notes (Signed)
His wife reported to EMS pt. (parkinson's pt.) has had increased weakness and tremor past 2-3 days.  He arrives awake and in no distress and minimally verbal.  Oriented only to situation.  His skin is pale warm and dry and he is breathing normally.

## 2015-01-14 NOTE — ED Notes (Signed)
Per pt's wife at bedside - pt has been experiencing constipation x5 days, pt has been given Sinucot, magnesium citrate and a fleets enema at home w/o relief. Tonight pt was unable to ambulate w/ his walker per his normal which concerned the wife d/t hx of illness presents w/ generalized weakness and inability to ambulate. [name] reports pt also has hx of dysphagia and pt has "coughing fits" shortly after eating and pts wife concerned he may have aspiration pneumonia.

## 2015-01-15 ENCOUNTER — Inpatient Hospital Stay (HOSPITAL_COMMUNITY): Payer: Medicare Other

## 2015-01-15 ENCOUNTER — Encounter (HOSPITAL_COMMUNITY): Payer: Self-pay | Admitting: Internal Medicine

## 2015-01-15 DIAGNOSIS — I480 Paroxysmal atrial fibrillation: Secondary | ICD-10-CM | POA: Diagnosis not present

## 2015-01-15 DIAGNOSIS — G4730 Sleep apnea, unspecified: Secondary | ICD-10-CM | POA: Diagnosis present

## 2015-01-15 DIAGNOSIS — I442 Atrioventricular block, complete: Secondary | ICD-10-CM | POA: Diagnosis present

## 2015-01-15 DIAGNOSIS — K5900 Constipation, unspecified: Secondary | ICD-10-CM | POA: Insufficient documentation

## 2015-01-15 DIAGNOSIS — F0150 Vascular dementia without behavioral disturbance: Secondary | ICD-10-CM | POA: Diagnosis present

## 2015-01-15 DIAGNOSIS — I482 Chronic atrial fibrillation: Secondary | ICD-10-CM | POA: Diagnosis not present

## 2015-01-15 DIAGNOSIS — I10 Essential (primary) hypertension: Secondary | ICD-10-CM | POA: Diagnosis present

## 2015-01-15 DIAGNOSIS — J690 Pneumonitis due to inhalation of food and vomit: Secondary | ICD-10-CM | POA: Diagnosis not present

## 2015-01-15 DIAGNOSIS — I2510 Atherosclerotic heart disease of native coronary artery without angina pectoris: Secondary | ICD-10-CM | POA: Diagnosis present

## 2015-01-15 DIAGNOSIS — Z950 Presence of cardiac pacemaker: Secondary | ICD-10-CM | POA: Diagnosis not present

## 2015-01-15 DIAGNOSIS — F329 Major depressive disorder, single episode, unspecified: Secondary | ICD-10-CM | POA: Diagnosis present

## 2015-01-15 DIAGNOSIS — Z66 Do not resuscitate: Secondary | ICD-10-CM | POA: Diagnosis present

## 2015-01-15 DIAGNOSIS — Z9841 Cataract extraction status, right eye: Secondary | ICD-10-CM | POA: Diagnosis not present

## 2015-01-15 DIAGNOSIS — I4891 Unspecified atrial fibrillation: Secondary | ICD-10-CM | POA: Diagnosis present

## 2015-01-15 DIAGNOSIS — Z961 Presence of intraocular lens: Secondary | ICD-10-CM | POA: Diagnosis present

## 2015-01-15 DIAGNOSIS — R4182 Altered mental status, unspecified: Secondary | ICD-10-CM | POA: Diagnosis present

## 2015-01-15 DIAGNOSIS — R509 Fever, unspecified: Secondary | ICD-10-CM | POA: Diagnosis present

## 2015-01-15 DIAGNOSIS — I252 Old myocardial infarction: Secondary | ICD-10-CM | POA: Diagnosis not present

## 2015-01-15 DIAGNOSIS — F0390 Unspecified dementia without behavioral disturbance: Secondary | ICD-10-CM

## 2015-01-15 DIAGNOSIS — G20 Parkinson's disease: Secondary | ICD-10-CM | POA: Diagnosis present

## 2015-01-15 DIAGNOSIS — E785 Hyperlipidemia, unspecified: Secondary | ICD-10-CM | POA: Diagnosis present

## 2015-01-15 DIAGNOSIS — G9340 Encephalopathy, unspecified: Secondary | ICD-10-CM | POA: Diagnosis present

## 2015-01-15 DIAGNOSIS — Z9842 Cataract extraction status, left eye: Secondary | ICD-10-CM | POA: Diagnosis not present

## 2015-01-15 DIAGNOSIS — L130 Dermatitis herpetiformis: Secondary | ICD-10-CM | POA: Diagnosis present

## 2015-01-15 DIAGNOSIS — R5081 Fever presenting with conditions classified elsewhere: Secondary | ICD-10-CM

## 2015-01-15 DIAGNOSIS — Z886 Allergy status to analgesic agent status: Secondary | ICD-10-CM | POA: Diagnosis not present

## 2015-01-15 DIAGNOSIS — Z888 Allergy status to other drugs, medicaments and biological substances status: Secondary | ICD-10-CM | POA: Diagnosis not present

## 2015-01-15 DIAGNOSIS — R1310 Dysphagia, unspecified: Secondary | ICD-10-CM | POA: Diagnosis present

## 2015-01-15 DIAGNOSIS — Z7982 Long term (current) use of aspirin: Secondary | ICD-10-CM | POA: Diagnosis not present

## 2015-01-15 DIAGNOSIS — N400 Enlarged prostate without lower urinary tract symptoms: Secondary | ICD-10-CM | POA: Diagnosis present

## 2015-01-15 LAB — COMPREHENSIVE METABOLIC PANEL
ALT: 20 U/L (ref 0–53)
AST: 24 U/L (ref 0–37)
Albumin: 3.5 g/dL (ref 3.5–5.2)
Alkaline Phosphatase: 62 U/L (ref 39–117)
Anion gap: 5 (ref 5–15)
BUN: 16 mg/dL (ref 6–23)
CO2: 26 mmol/L (ref 19–32)
CREATININE: 0.74 mg/dL (ref 0.50–1.35)
Calcium: 8.2 mg/dL — ABNORMAL LOW (ref 8.4–10.5)
Chloride: 102 mmol/L (ref 96–112)
GFR calc non Af Amer: 87 mL/min — ABNORMAL LOW (ref >90)
GLUCOSE: 100 mg/dL — AB (ref 70–99)
Potassium: 3.8 mmol/L (ref 3.5–5.1)
SODIUM: 133 mmol/L — AB (ref 135–145)
Total Bilirubin: 0.8 mg/dL (ref 0.3–1.2)
Total Protein: 6.2 g/dL (ref 6.0–8.3)

## 2015-01-15 LAB — CBC
HEMATOCRIT: 37.2 % — AB (ref 39.0–52.0)
HEMOGLOBIN: 11.7 g/dL — AB (ref 13.0–17.0)
MCH: 26.7 pg (ref 26.0–34.0)
MCHC: 31.5 g/dL (ref 30.0–36.0)
MCV: 84.9 fL (ref 78.0–100.0)
Platelets: 299 10*3/uL (ref 150–400)
RBC: 4.38 MIL/uL (ref 4.22–5.81)
RDW: 15.1 % (ref 11.5–15.5)
WBC: 7.3 10*3/uL (ref 4.0–10.5)

## 2015-01-15 LAB — TSH: TSH: 4.184 u[IU]/mL (ref 0.350–4.500)

## 2015-01-15 LAB — PHOSPHORUS: PHOSPHORUS: 3 mg/dL (ref 2.3–4.6)

## 2015-01-15 LAB — INFLUENZA PANEL BY PCR (TYPE A & B)
H1N1 flu by pcr: NOT DETECTED
Influenza A By PCR: NEGATIVE
Influenza B By PCR: NEGATIVE

## 2015-01-15 LAB — GLUCOSE, CAPILLARY: Glucose-Capillary: 108 mg/dL — ABNORMAL HIGH (ref 70–99)

## 2015-01-15 LAB — MAGNESIUM: MAGNESIUM: 2.3 mg/dL (ref 1.5–2.5)

## 2015-01-15 MED ORDER — MIRABEGRON ER 50 MG PO TB24
50.0000 mg | ORAL_TABLET | Freq: Every day | ORAL | Status: DC
  Administered 2015-01-15 – 2015-01-17 (×3): 50 mg via ORAL
  Filled 2015-01-15 (×3): qty 1

## 2015-01-15 MED ORDER — ACETAMINOPHEN 325 MG PO TABS: 650.0000 mg | ORAL_TABLET | Freq: Four times a day (QID) | ORAL | Status: DC | PRN

## 2015-01-15 MED ORDER — ONDANSETRON HCL 4 MG/2ML IJ SOLN: 4.0000 mg | Freq: Four times a day (QID) | INTRAMUSCULAR | Status: DC | PRN

## 2015-01-15 MED ORDER — VANCOMYCIN HCL IN DEXTROSE 1-5 GM/200ML-% IV SOLN
1000.0000 mg | Freq: Two times a day (BID) | INTRAVENOUS | Status: DC
  Administered 2015-01-15 – 2015-01-17 (×4): 1000 mg via INTRAVENOUS
  Filled 2015-01-15 (×4): qty 200

## 2015-01-15 MED ORDER — CARBIDOPA-LEVODOPA 25-100 MG PO TABS
1.0000 | ORAL_TABLET | Freq: Three times a day (TID) | ORAL | Status: DC
  Administered 2015-01-15 (×3): 1 via ORAL
  Filled 2015-01-15 (×7): qty 1

## 2015-01-15 MED ORDER — ENTACAPONE 200 MG PO TABS
200.0000 mg | ORAL_TABLET | Freq: Three times a day (TID) | ORAL | Status: DC
  Administered 2015-01-15 – 2015-01-17 (×8): 200 mg via ORAL
  Filled 2015-01-15 (×10): qty 1

## 2015-01-15 MED ORDER — HYDROCODONE-ACETAMINOPHEN 5-325 MG PO TABS: 1.0000 | ORAL_TABLET | ORAL | Status: DC | PRN

## 2015-01-15 MED ORDER — TAMSULOSIN HCL 0.4 MG PO CAPS
0.4000 mg | ORAL_CAPSULE | Freq: Every day | ORAL | Status: DC
  Administered 2015-01-15 – 2015-01-17 (×3): 0.4 mg via ORAL
  Filled 2015-01-15 (×3): qty 1

## 2015-01-15 MED ORDER — ESCITALOPRAM OXALATE 10 MG PO TABS
10.0000 mg | ORAL_TABLET | Freq: Every day | ORAL | Status: DC
  Administered 2015-01-15 – 2015-01-17 (×3): 10 mg via ORAL
  Filled 2015-01-15 (×3): qty 1

## 2015-01-15 MED ORDER — CARBIDOPA-LEVODOPA 25-100 MG PO TABS
2.0000 | ORAL_TABLET | ORAL | Status: DC
  Administered 2015-01-16 – 2015-01-17 (×5): 2 via ORAL
  Filled 2015-01-15 (×7): qty 2

## 2015-01-15 MED ORDER — PIPERACILLIN-TAZOBACTAM 3.375 G IVPB
3.3750 g | Freq: Three times a day (TID) | INTRAVENOUS | Status: DC
  Administered 2015-01-15 – 2015-01-17 (×7): 3.375 g via INTRAVENOUS
  Filled 2015-01-15 (×8): qty 50

## 2015-01-15 MED ORDER — VANCOMYCIN HCL IN DEXTROSE 1-5 GM/200ML-% IV SOLN
1000.0000 mg | Freq: Once | INTRAVENOUS | Status: AC
  Administered 2015-01-15: 1000 mg via INTRAVENOUS
  Filled 2015-01-15: qty 200

## 2015-01-15 MED ORDER — CARBIDOPA-LEVODOPA-ENTACAPONE 50-200-200 MG PO TABS: 1.0000 | ORAL_TABLET | Freq: Three times a day (TID) | ORAL | Status: DC

## 2015-01-15 MED ORDER — ENOXAPARIN SODIUM 40 MG/0.4ML ~~LOC~~ SOLN
40.0000 mg | SUBCUTANEOUS | Status: DC
  Administered 2015-01-15 – 2015-01-17 (×3): 40 mg via SUBCUTANEOUS
  Filled 2015-01-15 (×3): qty 0.4

## 2015-01-15 MED ORDER — GUAIFENESIN ER 600 MG PO TB12
600.0000 mg | ORAL_TABLET | Freq: Two times a day (BID) | ORAL | Status: DC
  Administered 2015-01-15 – 2015-01-17 (×5): 600 mg via ORAL
  Filled 2015-01-15 (×6): qty 1

## 2015-01-15 MED ORDER — EZETIMIBE 10 MG PO TABS
10.0000 mg | ORAL_TABLET | Freq: Every day | ORAL | Status: DC
  Administered 2015-01-15 – 2015-01-17 (×3): 10 mg via ORAL
  Filled 2015-01-15 (×3): qty 1

## 2015-01-15 MED ORDER — POLYETHYLENE GLYCOL 3350 17 G PO PACK: 17.0000 g | PACK | Freq: Every day | ORAL | Status: DC | PRN

## 2015-01-15 MED ORDER — ACETAMINOPHEN 650 MG RE SUPP: 650.0000 mg | Freq: Four times a day (QID) | RECTAL | Status: DC | PRN

## 2015-01-15 MED ORDER — DONEPEZIL HCL 10 MG PO TABS
10.0000 mg | ORAL_TABLET | Freq: Every day | ORAL | Status: DC
  Administered 2015-01-15 – 2015-01-17 (×3): 10 mg via ORAL
  Filled 2015-01-15 (×3): qty 1

## 2015-01-15 MED ORDER — QUETIAPINE FUMARATE 25 MG PO TABS
25.0000 mg | ORAL_TABLET | Freq: Every day | ORAL | Status: DC
  Administered 2015-01-15 – 2015-01-16 (×2): 25 mg via ORAL
  Filled 2015-01-15 (×3): qty 1

## 2015-01-15 MED ORDER — FLEET ENEMA 7-19 GM/118ML RE ENEM: 1.0000 | ENEMA | Freq: Once | RECTAL | Status: AC | PRN

## 2015-01-15 MED ORDER — DAPSONE 25 MG PO TABS
50.0000 mg | ORAL_TABLET | Freq: Every day | ORAL | Status: DC
  Administered 2015-01-15 – 2015-01-17 (×3): 50 mg via ORAL
  Filled 2015-01-15 (×3): qty 2

## 2015-01-15 MED ORDER — SENNA 8.6 MG PO TABS
1.0000 | ORAL_TABLET | Freq: Two times a day (BID) | ORAL | Status: DC
  Administered 2015-01-15 – 2015-01-17 (×5): 8.6 mg via ORAL
  Filled 2015-01-15 (×5): qty 1

## 2015-01-15 MED ORDER — SODIUM CHLORIDE 0.9 % IV SOLN
INTRAVENOUS | Status: AC
  Administered 2015-01-15: 03:00:00 via INTRAVENOUS

## 2015-01-15 MED ORDER — DOCUSATE SODIUM 100 MG PO CAPS
100.0000 mg | ORAL_CAPSULE | Freq: Two times a day (BID) | ORAL | Status: DC
  Administered 2015-01-15 – 2015-01-17 (×5): 100 mg via ORAL
  Filled 2015-01-15 (×6): qty 1

## 2015-01-15 MED ORDER — CETYLPYRIDINIUM CHLORIDE 0.05 % MT LIQD
7.0000 mL | Freq: Two times a day (BID) | OROMUCOSAL | Status: DC
  Administered 2015-01-15 – 2015-01-17 (×3): 7 mL via OROMUCOSAL

## 2015-01-15 MED ORDER — BISACODYL 10 MG RE SUPP: 10.0000 mg | Freq: Every day | RECTAL | Status: DC | PRN

## 2015-01-15 MED ORDER — ASPIRIN EC 81 MG PO TBEC
81.0000 mg | DELAYED_RELEASE_TABLET | Freq: Every day | ORAL | Status: DC
  Administered 2015-01-15 – 2015-01-17 (×3): 81 mg via ORAL
  Filled 2015-01-15 (×3): qty 1

## 2015-01-15 MED ORDER — CARBIDOPA-LEVODOPA 25-100 MG PO TABS
1.0000 | ORAL_TABLET | Freq: Every day | ORAL | Status: DC
  Filled 2015-01-15: qty 1

## 2015-01-15 MED ORDER — SODIUM CHLORIDE 0.9 % IJ SOLN: 3.0000 mL | Freq: Two times a day (BID) | INTRAMUSCULAR | Status: DC

## 2015-01-15 MED ORDER — ONDANSETRON HCL 4 MG PO TABS: 4.0000 mg | ORAL_TABLET | Freq: Four times a day (QID) | ORAL | Status: DC | PRN

## 2015-01-15 NOTE — Progress Notes (Signed)
CARE MANAGEMENT NOTE 01/15/2015  Patient:  ENIOLA, JOSWICK   Account Number:  [id number]  Date Initiated:  01/15/2015  Documentation initiated by:  Ferdinand Cava  Subjective/Objective Assessment:   74 yo male admitted with fever and increased weakness from home     Action/Plan:   discharge planning   Anticipated DC Date:  01/17/2015   Anticipated DC Plan:        DC Planning Services  CM consult      Choice offered to / List presented to:             Status of service:  In process, will continue to follow Medicare Important Message given?   (If response is "NO", the following Medicare IM given date fields will be blank) Date Medicare IM given:   Medicare IM given by:   Date Additional Medicare IM given:   Additional Medicare IM given by:    Discharge Disposition:    Per UR Regulation:    If discussed at Long Length of Stay Meetings, dates discussed:    Comments:  01/15/15 Ferdinand Cava RN BSN CM [phone] Spoke with patient spouse in room and she stated that the patient lives at home and she has a caregiver that come to the house 2x/week for 6 hours each visit. The patient goes to the gym 2x/week and has a Systems analyst and rides the stationary bike on the off days. The spouse drives patient and they remain active in the community. Patient's spouse stated that they patient has been in CIR in 2015 and it was beneficial. They did have HH PT one time but it was not a positive experience. The patient's spouse is open to consider PT recommendations but her preference is for the patient to return home to his routine. Will continue to follow.  01/15/15 Ferdinand Cava RN BSN CM [phone] Patient lives at home with spouse and uses a rolling walker. Will follow up with patient spouse as his verbalizations are limited, left message on home phone, will continue to follow.

## 2015-01-15 NOTE — H&P (Addendum)
PCP: Neena Rhymes, MD  Cardiology [facility]  Chief Complaint:  Confusion and fever  HPI: [name] is a 74 y.o. male   has a past medical history of Parkinson disease; Bradycardia; Sleep apnea; Hypertension; Dermatitis; Depression; Rib fractures (2011); Maxillary fracture (2009); BPH (benign prostatic hypertrophy); Coronary artery disease (1986); Normal echocardiogram (2007); Normal nuclear stress test (2006); CAD (coronary artery disease); Inferior MI; Dermatitis herpetiformis; Maxillary fracture (2009); Rib fracture (07/2010); Atrioventricular block, complete; Pacemaker; and H/O hiatal hernia.   Presented with  Last reports the patient had not had a bowel movement for the past 5 days. Today she started to have confusion and feeling so weak he is unable to ambulate. Wife reports some cough.  Yesterday he has wet the bed. Wife has been trying to work on constipation she gave him Magnesium citrate but he was unable to have a BM.  Patient's wife states that he gets confused when he is ill. On arrival to emergency department he was noted to have a fever up to 101.8. Wife denies any sick contacts.  He reports frequent coughing fits after eating. Patient has history of Parkinson disease and dementia. Chest x-ray showing no pulmonary infiltrates stable cardiomegaly. Of note patient has history of atrial fibrillation and heart block followed by cardiology. Not a candidate for anticoagulation due to frequent falls He is status post pacemaker ER has attempted disimpaction and it was successful. Patient has hx of Parkinson sp neur-stimulator implantation. He is followed by Neurology at [facility].  Hospitalist was called for admission for fever and confusion.  Patient had his flu shot this year.  Review of Systems:    Pertinent positives include: Fevers, chills,confusion, constipation, productive cough,  Constitutional:  No weight loss, night sweats,  fatigue, weight loss  HEENT:  No  headaches, Difficulty swallowing,Tooth/dental problems,Sore throat,  No sneezing, itching, ear ache, nasal congestion, post nasal drip,  Cardio-vascular:  No chest pain, Orthopnea, PND, anasarca, dizziness, palpitations.no Bilateral lower extremity swelling  GI:  No heartburn, indigestion, abdominal pain, nausea, vomiting, diarrhea, change in bowel habits, loss of appetite, melena, blood in stool, hematemesis Resp:  no shortness of breath at rest. No dyspnea on exertion, No excess mucus, no  No non-productive cough, No coughing up of blood.No change in color of mucus.No wheezing. Skin:  no rash or lesions. No jaundice GU:  no dysuria, change in color of urine, no urgency or frequency. No straining to urinate.  No flank pain.  Musculoskeletal:  No joint pain or no joint swelling. No decreased range of motion. No back pain.  Psych:  No change in mood or affect. No depression or anxiety. No memory loss.  Neuro: no localizing neurological complaints, no tingling, no weakness, no double vision, no gait abnormality, no slurred speech, no   Otherwise ROS are negative except for above, 10 systems were reviewed  Past Medical History: Past Medical History  Diagnosis Date  . Parkinson disease   . Bradycardia     syncope due to prolonged pauses, resolved with PPM  . Sleep apnea   . Hypertension   . Dermatitis   . Depression   . Rib fractures 2011    S/p fall  . Maxillary fracture 2009    Right  . BPH (benign prostatic hypertrophy)   . Coronary artery disease 1986    Inferior wall MI treated with Angioplasty  . Normal echocardiogram 2007    Mild LVH with Impaired relaxation, Mild-Mod. Aortic Root dilation, Mild Aortic Sclerosis, Mild  MR, Mild TR, Ef-50-55%  . Normal nuclear stress test 2006    Normal EF-54%  . CAD (coronary artery disease)   . Inferior MI   . 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 Maxillary fracture 2009    Right side  . Rib fracture 07/2010  . Atrioventricular  block, complete   . Pacemaker   . H/O hiatal hernia    Past Surgical History  Procedure Laterality Date  . Pacemaker insertion  10/28/10    MDT implanted by Dr Ladona Ridgel  . Deep brain stimulator placement      Parkinsons  . Arthroscopic knee      Left knee  . Cataract surgery      Bilateral  . Inguinal hernia repair      Bilateral  . Cardiac catheterization      Multiple Angioplasties, last 2002  . Removal of ganglion cyst      right wrist  . Insertion / placement / revision neurostimulator    . Eye surgery    . Insert / replace / remove pacemaker       Medications: Prior to Admission medications   Medication Sig Start Date End Date Taking? Authorizing Provider  aspirin 81 MG tablet Take 81 mg by mouth daily.   Yes Historical Provider, MD  Calcium Carb-Cholecalciferol (CALCIUM-VITAMIN D) 600-400 MG-UNIT TABS Take 2 tablets by mouth daily.   Yes Historical Provider, MD  carbidopa-levodopa (SINEMET) 25-100 MG per tablet Take 1 tablet by mouth at bedtime.     Yes Historical Provider, MD  carbidopa-levodopa-entacapone (STALEVO) 50-200-200 MG per tablet Take 1 tablet by mouth 3 (three) times daily.   Yes Historical Provider, MD  dapsone 25 MG tablet Take 50 mg by mouth every morning.    Yes Historical Provider, MD  donepezil (ARICEPT) 10 MG tablet Take 10 mg by mouth daily.     Yes Historical Provider, MD  escitalopram (LEXAPRO) 10 MG tablet Take 1 tablet (10 mg total) by mouth daily. 05/24/14  Yes Daniel J Angiulli, PA-C  Magnesium 400 MG CAPS Take 400 mg by mouth daily. 05/24/14  Yes Daniel J Angiulli, PA-C  mirabegron ER (MYRBETRIQ) 50 MG TB24 tablet Take 1 tablet (50 mg total) by mouth daily. 05/24/14  Yes Daniel J Angiulli, PA-C  nitroGLYCERIN (NITROSTAT) 0.4 MG SL tablet Place 0.4 mg under the tongue every 5 (five) minutes as needed for chest pain.   Yes Historical Provider, MD  QUEtiapine (SEROQUEL) 25 MG tablet Take 25 mg by mouth at bedtime.   Yes Historical Provider, MD    tamsulosin (FLOMAX) 0.4 MG CAPS capsule Take 1 capsule (0.4 mg total) by mouth daily. 05/24/14  Yes Daniel J Angiulli, PA-C  vitamin B-12 (CYANOCOBALAMIN) 1000 MCG tablet Take 1 tablet (1,000 mcg total) by mouth daily. 05/24/14  Yes Daniel J Angiulli, PA-C  vitamin C (ASCORBIC ACID) 500 MG tablet Take 500 mg by mouth 2 (two) times daily.   Yes Historical Provider, MD  ZETIA 10 MG tablet TAKE 1 TABLET BY MOUTH DAILY 11/27/14  Yes Cassell Clement, MD  zinc gluconate 50 MG tablet Take 50 mg by mouth daily.   Yes Historical Provider, MD    Allergies:   Allergies  Allergen Reactions  . Crestor [Rosuvastatin Calcium] Other (See Comments)    Reaction:  Muscle weakness   . Rosuvastatin Dermatitis and Other (See Comments)    Reaction:  Muscle weakness   . Bactrim Rash  . Gluten Rash and Other (See Comments)  Pts wife states that he can eat some gluten.      Social History:  Ambulatory with walker   Lives at home With family     reports that he has never smoked. He does not have any smokeless tobacco history on file. He reports that he does not drink alcohol or use illicit drugs.    Family History: family history includes Heart attack in his father, mother, and sister; Heart disease in his brother and mother; Hypertension in his mother; Other in his father.    Physical Exam: Patient Vitals for the past 24 hrs:  BP Temp Temp src Pulse Resp SpO2  01/14/15 2215 124/65 mmHg - - (!) 55 22 93 %  01/14/15 2057 122/64 mmHg - - 60 16 -  01/14/15 1937 105/88 mmHg 101.8 F (38.8 C) Rectal 62 18 96 %    1. General:  in No Acute distress 2. Psychological: Alert and   Oriented 3. Head/ENT:    Dry Mucous Membranes                          Head Non traumatic, neck supple                          Normal   Dentition 4. SKIN:   decreased Skin turgor,  Skin clean Dry and intact no rash 5. Heart: Regular rate and rhythm no Murmur, Rub or gallop 6. Lungs: no wheezes or crackles  Some coarse breath  sounds 7. Abdomen: Soft, non-tender, Non distended 8. Lower extremities: no clubbing, cyanosis, or edema 9. Neurologically Grossly intact, moving all 4 extremities equally , tremor noted 10. MSK: Normal range of motion  body mass index is unknown because there is no weight on file.   Labs on Admission:   Results for orders placed or performed during the hospital encounter of 01/14/15 (from the past 24 hour(s))  CBC     Status: Abnormal   Collection Time: 01/14/15  9:11 PM  Result Value Ref Range   WBC 8.6 4.0 - 10.5 K/uL   RBC 4.51 4.22 - 5.81 MIL/uL   Hemoglobin 12.3 (L) 13.0 - 17.0 g/dL   HCT 60.4 (L) 54.0 - 98.1 %   MCV 84.9 78.0 - 100.0 fL   MCH 27.3 26.0 - 34.0 pg   MCHC 32.1 30.0 - 36.0 g/dL   RDW 19.1 47.8 - 29.5 %   Platelets 322 150 - 400 K/uL  Comprehensive metabolic panel     Status: Abnormal   Collection Time: 01/14/15  9:11 PM  Result Value Ref Range   Sodium 134 (L) 135 - 145 mmol/L   Potassium 4.0 3.5 - 5.1 mmol/L   Chloride 103 96 - 112 mmol/L   CO2 27 19 - 32 mmol/L   Glucose, Bld 108 (H) 70 - 99 mg/dL   BUN 16 6 - 23 mg/dL   Creatinine, Ser 6.21 0.50 - 1.35 mg/dL   Calcium 8.7 8.4 - 30.8 mg/dL   Total Protein 6.8 6.0 - 8.3 g/dL   Albumin 4.0 3.5 - 5.2 g/dL   AST 27 0 - 37 U/L   ALT 21 0 - 53 U/L   Alkaline Phosphatase 67 39 - 117 U/L   Total Bilirubin 0.8 0.3 - 1.2 mg/dL   GFR calc non Af Amer 87 (L) >90 mL/min   GFR calc Af Amer >90 >90 mL/min   Anion gap 4 (L)  5 - 15  Urinalysis, Routine w reflex microscopic     Status: Abnormal   Collection Time: 01/14/15  9:31 PM  Result Value Ref Range   Color, Urine AMBER (A) YELLOW   APPearance CLEAR CLEAR   Specific Gravity, Urine 1.026 1.005 - 1.030   pH 7.5 5.0 - 8.0   Glucose, UA NEGATIVE NEGATIVE mg/dL   Hgb urine dipstick NEGATIVE NEGATIVE   Bilirubin Urine NEGATIVE NEGATIVE   Ketones, ur 15 (A) NEGATIVE mg/dL   Protein, ur NEGATIVE NEGATIVE mg/dL   Urobilinogen, UA 0.2 0.0 - 1.0 mg/dL   Nitrite  NEGATIVE NEGATIVE   Leukocytes, UA NEGATIVE NEGATIVE    UA no evidence of UTI  No results found for: HGBA1C  CrCl cannot be calculated (Unknown ideal weight.).  BNP (last 3 results) No results for input(s): PROBNP in the last 8760 hours.  Other results:  I have pearsonaly reviewed this: ECG REPORT  Rate: 61  Rhythm: Sinus rhythm ST&T Change: No evidence of ischemia   There were no vitals filed for this visit.   Cultures:    Component Value Date/Time   SDES URINE, CLEAN CATCH 05/17/2014 1404   SPECREQUEST NONE 05/17/2014 1404   CULT NO GROWTH Performed at [facility] 05/17/2014 1404   REPTSTATUS 05/18/2014 FINAL 05/17/2014 1404     Radiological Exams on Admission: Dg Chest 2 View  01/14/2015   CLINICAL DATA:  Fever and prolonged cough.  EXAM: CHEST  2 VIEW  COMPARISON:  05/06/2014  FINDINGS: Right-sided dual lead pacemaker remains in place. Left-sided stimulator battery pack over the chest wall. Cardiomegaly is unchanged. Low lung volumes persist. Minimal atelectasis or scarring at the left costophrenic angle. No consolidation, large pleural effusion or pneumothorax. The bones are under mineralized, no acute osseous abnormalities are seen.  IMPRESSION: Stable cardiomegaly.  No acute pulmonary process.   Electronically Signed   By: Rubye Oaks M.D.   On: 01/14/2015 22:20   Dg Abd 2 Views  01/14/2015   CLINICAL DATA:  Constipation for 5 days, fever.  EXAM: ABDOMEN - 2 VIEW  COMPARISON:  None.  FINDINGS: No free intra-abdominal air. There is increased air throughout normal caliber small bowel loops in the central and right abdomen with a few air-fluid levels. Moderate stool throughout the colon with stool ball distending the rectum. Pelvic phleboliths are noted. Scoliotic curvature and multilevel did change is seen in the spine.  IMPRESSION: 1. Moderate stool throughout the colon with stool ball distending the rectum, can be seen in the setting of constipation. 2.  Increased air throughout normal caliber small bowel loops in the central in right abdomen, may reflect enteritis or ileus. No free air.   Electronically Signed   By: Rubye Oaks M.D.   On: 01/14/2015 22:23    Chart has been reviewed  Assessment/Plan  74 year old gentleman with history of Parkinson disease and dementia, coronary artery disease hypertension and atrial fibrillation not a candidate for atypical granulation presents with confusion severe constipation status post disimpaction and fever. No evidence of sepsis and started on admission  Present on Admission:  . Encephalopathy acute to factorial patient has history of dementia to begin with usually develops encephalopathy in the setting of illness. Patient was noted to be febrile. Source unclear at this point will continue to monitor will obtain CT of the head given confusion  . Sleep apnea - CPAP ordered   . PARKINSON'S DISEASE continue home medications patient is status post neurostimulator  . Fever- at  this point etiology unclear. Chest x-ray showed no evidence of aspiration. UA unremarkable. We will order influenza PCR. Patient appears to be stable. Will obtain blood cultures. Given chronic illness and frailty as well as implantable devices will cover with antibiotics for now until blood cultures are back  . Dementia - stable  . CAD (coronary artery disease) - stable continue home medications  . Atrial fibrillation - currently rate controlled patient is status post pacemaker   wife is concerned about possibility of aspiration. But states patient will unlikely to change his diet. Nonetheless she is interested in getting aspiration evaluation done will order   Prophylaxis:  Lovenox,   CODE STATUS:   DNR/DNI As per family  Other plan as per orders.  I have spent a total of 55 min on this admission  Chace Klippel 01/15/2015, 12:23 AM  [facility]  Pager [phone]   after 2 AM please page floor coverage PA If  7AM-7PM, please contact the day team taking care of the patient  [email or web address]  Password [email or web address]

## 2015-01-15 NOTE — Evaluation (Signed)
Clinical/Bedside Swallow Evaluation Patient Details  Name: [name] MRN: [id number] Date of Birth: 1941-10-19  Today's Date: 01/15/2015 Time: SLP Start Time (ACUTE ONLY): 1222 SLP Stop Time (ACUTE ONLY): 1250 SLP Time Calculation (min) (ACUTE ONLY): 28 min  Past Medical History:  Past Medical History  Diagnosis Date  . Parkinson disease   . Bradycardia     syncope due to prolonged pauses, resolved with PPM  . Sleep apnea   . Hypertension   . Dermatitis   . Depression   . Rib fractures 2011    S/p fall  . Maxillary fracture 2009    Right  . BPH (benign prostatic hypertrophy)   . Coronary artery disease 1986    Inferior wall MI treated with Angioplasty  . Normal echocardiogram 2007    Mild LVH with Impaired relaxation, Mild-Mod. Aortic Root dilation, Mild Aortic Sclerosis, Mild MR, Mild TR, Ef-50-55%  . Normal nuclear stress test 2006    Normal EF-54%  . CAD (coronary artery disease)   . Inferior MI   . 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 Maxillary fracture 2009    Right side  . Rib fracture 07/2010  . Atrioventricular block, complete   . Pacemaker   . H/O hiatal hernia    Past Surgical History:  Past Surgical History  Procedure Laterality Date  . Pacemaker insertion  10/28/10    MDT implanted by Dr Ladona Ridgel  . Deep brain stimulator placement      Parkinsons  . Arthroscopic knee      Left knee  . Cataract surgery      Bilateral  . Inguinal hernia repair      Bilateral  . Cardiac catheterization      Multiple Angioplasties, last 2002  . Removal of ganglion cyst      right wrist  . Insertion / placement / revision neurostimulator    . Eye surgery    . Insert / replace / remove pacemaker     HPI:  74 year old male admitted 01/14/15 due to confusion and fever. PMH significant for Parkinson's Disease, OSA, HTN, depression.   Assessment / Plan / Recommendation Clinical Impression  Pt oral motor strength and function appear adequate. No overt s/s  aspiration observed with any consistency tested. RN arrived with med, and pt was observed taking meds 1 at a time with water. Larger pill required bolus of applesauce to help clear. No overt difficulty with meds. Recommend dys 3 diet with thin liquids. Chop meats for energy conservation. Pt may need assistance with feeding due to tremors.. ST to sign off at this time. Please reconsult if needs arise.    Aspiration Risk  Mild    Diet Recommendation Dysphagia 3 (Mechanical Soft);Thin liquid   Liquid Administration via: Straw Medication Administration: Whole meds with liquid Supervision: Staff to assist with self feeding;Patient able to self feed;Full supervision/cueing for compensatory strategies Compensations: Slow rate;Small sips/bites Postural Changes and/or Swallow Maneuvers: Upright 30-60 min after meal;Seated upright 90 degrees    Other  Recommendations Oral Care Recommendations: Oral care BID   Follow Up Recommendations  None    Frequency and Duration        Pertinent Vitals/Pain No pain reported    SLP Swallow Goals  n/a   Swallow Study Prior Functional Status   Pt has been tolerating soft diet, thin liquids since CIR stay July 2015.    General Date of Onset: 01/14/15 HPI: 74 year old male admitted 01/14/15  due to confusion and fever. PMH significant for Parkinson's Disease, OSA, HTN, depression. Type of Study: Bedside swallow evaluation Previous Swallow Assessment: Pt seen during CIR stayin July 2015. Pt was discharged on dys 3/thin liquid diet. Diet Prior to this Study: NPO Temperature Spikes Noted: No Respiratory Status: Nasal cannula History of Recent Intubation: No Behavior/Cognition: Alert;Cooperative;Pleasant mood;Decreased sustained attention Oral Cavity - Dentition: Adequate natural dentition Self-Feeding Abilities: Needs assist;Able to feed self Patient Positioning: Upright in bed Baseline Vocal Quality: Clear;Low vocal intensity Volitional Cough:  Strong Volitional Swallow: Able to elicit    Oral/Motor/Sensory Function Overall Oral Motor/Sensory Function: Appears within functional limits for tasks assessed   Ice Chips Ice chips: Not tested   Thin Liquid Thin Liquid: Within functional limits Presentation: Straw    Nectar Thick Nectar Thick Liquid: Not tested   Honey Thick Honey Thick Liquid: Not tested   Puree Puree: Within functional limits Presentation: Spoon   Solid   GO   Celia B. Murvin Natal [facility], CCC-SLP [phone] Solid: Within functional limits Presentation: Self Fed       Murvin Natal, Ruffin Pyo 01/15/2015,1:44 PM

## 2015-01-15 NOTE — Progress Notes (Signed)
INITIAL NUTRITION ASSESSMENT  DOCUMENTATION CODES Per approved criteria  -Not Applicable   INTERVENTION: Provide Magic Cup BID, each provides 290 kcal, 9 grams of protein  NUTRITION DIAGNOSIS: Increased nutrient needs related to fever as evidenced by estimated energy needs.   Goal: Pt to meet >/= 90% of estimated energy needs  Monitor:  PO intake, weight trends, labs, I/O's  Reason for Assessment: Low Braden  74 y.o. male  Admitting Dx: Acute encephalopathy  ASSESSMENT: 74 y/o male with PMH of Parkinson disease, sleep apnea, HTN, dermatitis, and CAD. Presented with confusion and fever, and no BM in past 5 days. ED conducted fecal disimpaction.   Labs- low Ca, Na Weight history shows stable wt for past 6 months. Pt reported healthy appetite, and denied any wt loss. He stated usually eating three meals daily at home. Pt reported no decreased PO intake, and felt hungry upon assessment.  SPL to follow-up with swallow evaluation. NFPE did not reveal any subcutaneous body fat or muscle mass depletion. Pt was interested in trying [employer]. Provide Magic Cup BID.  Height: Ht Readings from Last 1 Encounters:  01/15/15 5\' 11"  (1.803 m)    Weight: Wt Readings from Last 1 Encounters:  01/15/15 177 lb 0.5 oz (80.3 kg)    Ideal Body Weight: 172 lb (78.2 kg)  % Ideal Body Weight: 97.2%  Wt Readings from Last 10 Encounters:  01/15/15 177 lb 0.5 oz (80.3 kg)  12/27/14 174 lb (78.926 kg)  11/06/14 170 lb (77.111 kg)  07/03/14 167 lb (75.751 kg)  03/06/14 171 lb 4 oz (77.678 kg)  03/01/14 169 lb (76.658 kg)  12/14/13 170 lb 12.8 oz (77.474 kg)  10/24/13 164 lb (74.39 kg)  06/29/13 166 lb (75.297 kg)  02/23/13 173 lb 9.6 oz (78.744 kg)    Usual Body Weight: unknown  % Usual Body Weight: -  BMI:  Body mass index is 24.7 kg/(m^2).  Estimated Nutritional Needs: Kcal: 1850-2000 Protein: 85-100 grams Fluid: >/= 1.8L daily  Skin: intatct  Diet Order: DIET DYS  3  EDUCATION NEEDS: -No education needs identified at this time   Intake/Output Summary (Last 24 hours) at 01/15/15 1114 Last data filed at 01/15/15 0600  Gross per 24 hour  Intake  382.5 ml  Output      0 ml  Net  382.5 ml    Last BM: 3/7   Labs:   Recent Labs Lab 01/14/15 2111 01/15/15 0350  NA 134* 133*  K 4.0 3.8  CL 103 102  CO2 27 26  BUN 16 16  CREATININE 0.74 0.74  CALCIUM 8.7 8.2*  MG  --  2.3  PHOS  --  3.0  GLUCOSE 108* 100*    CBG (last 3)   Recent Labs  01/14/15 2137  GLUCAP 108*    Scheduled Meds: . aspirin EC  81 mg Oral Daily  . carbidopa-levodopa  1 tablet Oral QHS  . carbidopa-levodopa  1 tablet Oral TID  . dapsone  50 mg Oral Daily  . docusate sodium  100 mg Oral BID  . donepezil  10 mg Oral Daily  . enoxaparin (LOVENOX) injection  40 mg Subcutaneous Q24H  . entacapone  200 mg Oral TID  . escitalopram  10 mg Oral Daily  . ezetimibe  10 mg Oral Daily  . guaiFENesin  600 mg Oral BID  . mirabegron ER  50 mg Oral Daily  . piperacillin-tazobactam (ZOSYN)  IV  3.375 g Intravenous 3 times per day  .  QUEtiapine  25 mg Oral QHS  . senna  1 tablet Oral BID  . sodium chloride  3 mL Intravenous Q12H  . tamsulosin  0.4 mg Oral Daily  . vancomycin  1,000 mg Intravenous Q12H    Continuous Infusions: . sodium chloride 75 mL/hr at 01/15/15 0258    Past Medical History  Diagnosis Date  . Parkinson disease   . Bradycardia     syncope due to prolonged pauses, resolved with PPM  . Sleep apnea   . Hypertension   . Dermatitis   . Depression   . Rib fractures 2011    S/p fall  . Maxillary fracture 2009    Right  . BPH (benign prostatic hypertrophy)   . Coronary artery disease 1986    Inferior wall MI treated with Angioplasty  . Normal echocardiogram 2007    Mild LVH with Impaired relaxation, Mild-Mod. Aortic Root dilation, Mild Aortic Sclerosis, Mild MR, Mild TR, Ef-50-55%  . Normal nuclear stress test 2006    Normal EF-54%  . CAD  (coronary artery disease)   . Inferior MI   . 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 Maxillary fracture 2009    Right side  . Rib fracture 07/2010  . Atrioventricular block, complete   . Pacemaker   . H/O hiatal hernia     Past Surgical History  Procedure Laterality Date  . Pacemaker insertion  10/28/10    MDT implanted by Dr Ladona Ridgel  . Deep brain stimulator placement      Parkinsons  . Arthroscopic knee      Left knee  . Cataract surgery      Bilateral  . Inguinal hernia repair      Bilateral  . Cardiac catheterization      Multiple Angioplasties, last 2002  . Removal of ganglion cyst      right wrist  . Insertion / placement / revision neurostimulator    . Eye surgery    . Insert / replace / remove pacemaker      Cristela Felt, MS Dietetic Intern Pager: [phone]

## 2015-01-15 NOTE — ED Notes (Signed)
Assisted Dr. Romeo Apple w/ fecal disimpaction.

## 2015-01-15 NOTE — Progress Notes (Signed)
ANTIBIOTIC CONSULT NOTE - INITIAL  Pharmacy Consult for vancomycin Indication: Fever of unknown origin, empiric  Allergies  Allergen Reactions  . Crestor [Rosuvastatin Calcium] Other (See Comments)    Reaction:  Muscle weakness   . Rosuvastatin Dermatitis and Other (See Comments)    Reaction:  Muscle weakness   . Bactrim Rash  . Gluten Rash and Other (See Comments)    Pts wife states that he can eat some gluten.      Patient Measurements: Height: 5\' 11"  (180.3 cm) Weight: 177 lb 0.5 oz (80.3 kg) IBW/kg (Calculated) : 75.3 Adjusted Body Weight:   Vital Signs: Temp: 98.3 F (36.8 C) (03/07 0300) Temp Source: Oral (03/07 0300) BP: 130/63 mmHg (03/07 0300) Pulse Rate: 63 (03/07 0300) Intake/Output from previous day:   Intake/Output from this shift:    Labs:  Recent Labs  01/14/15 2111 01/15/15 0350  WBC 8.6 7.3  HGB 12.3* 11.7*  PLT 322 299  CREATININE 0.74 0.74   Estimated Creatinine Clearance: 83.7 mL/min (by C-G formula based on Cr of 0.74). No results for input(s): VANCOTROUGH, VANCOPEAK, VANCORANDOM, GENTTROUGH, GENTPEAK, GENTRANDOM, TOBRATROUGH, TOBRAPEAK, TOBRARND, AMIKACINPEAK, AMIKACINTROU, AMIKACIN in the last 72 hours.   Microbiology: No results found for this or any previous visit (from the past 720 hour(s)).  Medical History: Past Medical History  Diagnosis Date  . Parkinson disease   . Bradycardia     syncope due to prolonged pauses, resolved with PPM  . Sleep apnea   . Hypertension   . Dermatitis   . Depression   . Rib fractures 2011    S/p fall  . Maxillary fracture 2009    Right  . BPH (benign prostatic hypertrophy)   . Coronary artery disease 1986    Inferior wall MI treated with Angioplasty  . Normal echocardiogram 2007    Mild LVH with Impaired relaxation, Mild-Mod. Aortic Root dilation, Mild Aortic Sclerosis, Mild MR, Mild TR, Ef-50-55%  . Normal nuclear stress test 2006    Normal EF-54%  . CAD (coronary artery disease)   .  Inferior MI   . 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 Maxillary fracture 2009    Right side  . Rib fracture 07/2010  . Atrioventricular block, complete   . Pacemaker   . H/O hiatal hernia     Medications:  Anti-infectives    Start     Dose/Rate Route Frequency Ordered Stop   01/15/15 1800  vancomycin (VANCOCIN) IVPB 1000 mg/200 mL premix     1,000 mg 200 mL/hr over 60 Minutes Intravenous Every 12 hours 01/15/15 0325     01/15/15 1000  dapsone tablet 50 mg     50 mg Oral Daily 01/15/15 0257     01/15/15 0330  vancomycin (VANCOCIN) IVPB 1000 mg/200 mL premix     1,000 mg 200 mL/hr over 60 Minutes Intravenous  Once 01/15/15 0325     01/15/15 0300  piperacillin-tazobactam (ZOSYN) IVPB 3.375 g     3.375 g 12.5 mL/hr over 240 Minutes Intravenous 3 times per day 01/15/15 0257       Assessment: Patient with fever and confusion.  Fever of unknown origin.   Goal of Therapy:  Vancomycin trough level 15-20 mcg/ml  Plan:  Measure antibiotic drug levels at steady state Follow up culture results Vancomycin 1gm iv q12hr  Aleene Davidson Crowford 01/15/2015,5:05 AM

## 2015-01-15 NOTE — Progress Notes (Signed)
UR complete 

## 2015-01-15 NOTE — ED Notes (Signed)
Patient transported to CT 

## 2015-01-15 NOTE — Progress Notes (Signed)
Patient seen and examined at bedside. 74 year old male with past medical history of Parkinson's disease who was admitted for fever and confusion.  Agree with assessment and plan done by [facility] today 01/15/2015  Patient started empirically on vancomycin and Zosyn but no definite source of fever identified. Follow-up blood culture results. Narrow down antibiotics as soon as we get results of blood cultures. Follow-up of on influenza panel. Physical therapy evaluation for safe discharge plan.  Manson Passey [facility] [phone]

## 2015-01-16 DIAGNOSIS — J690 Pneumonitis due to inhalation of food and vomit: Principal | ICD-10-CM

## 2015-01-16 DIAGNOSIS — Z950 Presence of cardiac pacemaker: Secondary | ICD-10-CM

## 2015-01-16 DIAGNOSIS — I482 Chronic atrial fibrillation: Secondary | ICD-10-CM

## 2015-01-16 NOTE — Evaluation (Signed)
Speech Language Pathology Evaluation Patient Details Name: KINCADE GRANBERG MRN: [id number] DOB: [birth year] Today's Date: 01/16/2015 Time: 8527-7824 SLP Time Calculation (min) (ACUTE ONLY): 40 min  Problem List:  Patient Active Problem List   Diagnosis Date Noted  . Acute encephalopathy   . Fever 05/05/2014  . Postsurgical percutaneous transluminal coronary angioplasty status 08/23/2013  . Healed myocardial infarct 08/23/2013  . Obstructive apnea 08/23/2013  . B12 deficiency 02/03/2013  . Posterior vitreous detachment 03/04/2012  . Idiopathic Parkinson's disease 08/29/2011  . Hematoma of chest wall 08/26/2011  . Hyperlipidemia 08/22/2011  . CAD (coronary artery disease) 08/22/2011  . Dementia 08/22/2011  . Atrial fibrillation 01/21/2011  . Cardiac pacemaker in situ 01/21/2011   Past Medical History:  Past Medical History  Diagnosis Date  . Parkinson disease   . Bradycardia     syncope due to prolonged pauses, resolved with PPM  . Sleep apnea   . Hypertension   . Dermatitis   . Depression   . Rib fractures 2011    S/p fall  . Maxillary fracture 2009    Right  . BPH (benign prostatic hypertrophy)   . Coronary artery disease 1986    Inferior wall MI treated with Angioplasty  . Normal echocardiogram 2007    Mild LVH with Impaired relaxation, Mild-Mod. Aortic Root dilation, Mild Aortic Sclerosis, Mild MR, Mild TR, Ef-50-55%  . Normal nuclear stress test 2006    Normal EF-54%  . CAD (coronary artery disease)   . Inferior MI   . 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 Maxillary fracture 2009    Right side  . Rib fracture 07/2010  . Atrioventricular block, complete   . Pacemaker   . H/O hiatal hernia    Past Surgical History:  Past Surgical History  Procedure Laterality Date  . Pacemaker insertion  10/28/10    MDT implanted by Dr Lovena Le  . Deep brain stimulator placement      Parkinsons  . Arthroscopic knee      Left knee  . Cataract surgery      Bilateral   . Inguinal hernia repair      Bilateral  . Cardiac catheterization      Multiple Angioplasties, last 2002  . Removal of ganglion cyst      right wrist  . Insertion / placement / revision neurostimulator    . Eye surgery    . Insert / replace / remove pacemaker     HPI:  74 year old male admitted 01/14/15 due to confusion and fever. PMH significant for Parkinson's Disease, OSA, HTN, depression. SLE ordered to evaluate com/cog status   Assessment / Plan / Recommendation Clinical Impression  The MMSE was administered. Pt scored 24/30, indicating mild cognitive impairment. Difficulty noted on orientation, attention, delayed recall, writing, and figure copying. Recommend supervision at home for decreased recall and attention. Progression of cognitive impairment is possible with continued progression of Parkinson's. Home health speech therapy may be beneficial for training/education of safety strategies in the home environment.    SLP Assessment  All further Speech Lanaguage Pathology  needs can be addressed in the next venue of care    Follow Up Recommendations  Home health SLP;24 hour supervision/assistance    Frequency and Duration   defer to home health ST     Pertinent Vitals/Pain Pain Assessment: No/denies pain   SLP Goals  Progression toward goals: Goals met, education completed, patient discharged from SLP Patient/Family Stated Goal: to appeal DC  decision  Recommend referral to home health ST  SLP Evaluation Prior Functioning  Cognitive/Linguistic Baseline: Within functional limits Baseline deficit details: Pt seen by SLP at [facility] in July 2015, but return to baseline was documented Type of Home: House  Lives With: Spouse Available Help at Discharge: Family;Personal care attendant;Available 24 hours/day Education: PhD in [employer] Vocation: Retired   Associate Professor  Overall Cognitive Status: Impaired/Different from baseline Arousal/Alertness: Awake/alert Orientation Level:  Disoriented to time;Oriented to person;Oriented to place Attention: Focused;Sustained Focused Attention: Appears intact Sustained Attention: Impaired Sustained Attention Impairment: Verbal basic Memory: Impaired Memory Impairment: Retrieval deficit Awareness: Impaired    Comprehension  Auditory Comprehension Overall Auditory Comprehension: Appears within functional limits for tasks assessed    Expression Expression Primary Mode of Expression: Verbal Verbal Expression Overall Verbal Expression: Appears within functional limits for tasks assessed Written Expression Dominant Hand: Right   Oral / Motor Oral Motor/Sensory Function Overall Oral Motor/Sensory Function: Appears within functional limits for tasks assessed Motor Speech Overall Motor Speech: Appears within functional limits for tasks assessed   GO   Celia B. Quentin Ore [facility], CCC-SLP [phone]  Shonna Chock 01/16/2015, 12:25 PM

## 2015-01-16 NOTE — Progress Notes (Addendum)
Patient ID: [name], male   DOB: 09-Oct-1941, 74 y.o.   MRN: [id number] [facility] PROGRESS NOTE  PISTOL KESSENICH WUJ:[id number] DOB: 01-07-1941 DOA: 01/14/2015 PCP: Neena Rhymes, MD  Brief narrative:    74 year old male with past medical history of Parkinson's disease, dementia, atrial fibrillation, status post pacemaker placement, coronary artery disease, dyslipidemia. Patient presented to [facility] with confusion, fevers and per family report coughing especially after eating over past day or so prior to this admission. Patient's family also reported that patient has been constipated for past couple of days. On admission, patient was hemodynamically stable. His chest x-ray did not show acute cardiopulmonary findings. Apparently patient had fever on arrival to ED which was up to 101.74F. He was admitted for further fever workup.  Assessment/Plan:    Principal Problem:   Acute encephalopathy / possible aspiration pneumonia - Likely secondary to worsening dementia or possible pneumonia, aspiration pneumonitis - Patient was started on vancomycin and Zosyn. - No fever since the admission and white blood cell count is within normal limits. We will change the antibiotics to Levaquin today. - Respiratory status is stable. - Needs physical therapy evaluation for safe discharge plan.  Active Problems:   Atrial fibrillation / Cardiac pacemaker in situ - Rate controlled. Patient is not a candidate for anticoagulation because of history of dementia and frequent falls. - Continue daily aspirin    Idiopathic Parkinson's disease / dementia without behavioral disturbance - Continue Sinemet 2 tablets daily, continue entacapone 200 mg 3 times daily - Continue donepezil 10 mg daily - Continue Seroquel 25 mg at bedtime and Lexapro 10 mg daily   Constipation - Continue Colace 100 mg twice daily, senna twice daily - Has had bowel movement in past 24 hours.    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 He needs physical therapy evaluation prior to discharge for safe discharge plan.  IV access:  Peripheral IV  Procedures and diagnostic studies:    Dg Chest 2 View 01/14/2015  Stable cardiomegaly.  No acute pulmonary process.     Ct Head Wo Contrast 01/15/2015   No acute intracranial process, stable appearance of the head from May 05, 2014.  Bilateral deep brain stimulators.     Dg Abd 2 Views 01/14/2015   1. Moderate stool throughout the colon with stool ball distending the rectum, can be seen in the setting of constipation. 2. Increased air throughout normal caliber small bowel loops in the central in right abdomen, may reflect enteritis or ileus. No free air.    Medical Consultants:  None  Other Consultants:  Physical therapy  IAnti-Infectives:   Zosyn 01/15/2015 --> 01/16/2015  Vancomycin 01/15/2015 --> 01/16/2015 Levaquin 01/16/2015 --> Dapsone since prior to admission    Antibiotics Given (last 72 hours)    Date/Time Action Medication Dose Rate   01/15/15 0355 Given   piperacillin-tazobactam (ZOSYN) IVPB 3.375 g 3.375 g 12.5 mL/hr   01/15/15 0444 Given   vancomycin (VANCOCIN) IVPB 1000 mg/200 mL premix 1,000 mg 200 mL/hr   01/15/15 1246 Given   dapsone tablet 50 mg 50 mg    01/15/15 1356 Given   piperacillin-tazobactam (ZOSYN) IVPB 3.375 g 3.375 g 12.5 mL/hr   01/15/15 1709  Given   vancomycin (VANCOCIN) IVPB 1000 mg/200 mL premix 1,000 mg 200 mL/hr   01/15/15 2212 Given   piperacillin-tazobactam (ZOSYN) IVPB 3.375 g 3.375 g 12.5 mL/hr   01/16/15 0523 Given   piperacillin-tazobactam (ZOSYN) IVPB 3.375 g 3.375 g 12.5 mL/hr   01/16/15 0523 Given   vancomycin (VANCOCIN) IVPB 1000 mg/200 mL premix 1,000  mg 200 mL/hr   01/16/15 0957 Given   dapsone tablet 50 mg 50 mg         Manson Passey, MD  [facility] Pager [phone]  If 7PM-7AM, please contact night-coverage [email or web address] Password [email or web address] 01/16/2015, 11:28 AM   LOS: 1 day    HPI/Subjective: No acute overnight events.  Objective: Filed Vitals:   01/15/15 1427 01/15/15 2022 01/16/15 0242 01/16/15 0607  BP: 118/68 118/59 115/81 121/67  Pulse: 63 65 62 59  Temp: 98.3 F (36.8 C) 98.2 F (36.8 C) 98.8 F (37.1 C) 97.9 F (36.6 C)  TempSrc: Axillary Oral Axillary Oral  Resp: 18 20 18 20   Height:      Weight:      SpO2: 93% 94% 96% 96%    Intake/Output Summary (Last 24 hours) at 01/16/15 1128 Last data filed at 01/16/15 0607  Gross per 24 hour  Intake   2310 ml  Output   2425 ml  Net   -115 ml    Exam:   General:  Pt is not in acute distress  Cardiovascular: Regular rate and rhythm, S1/S2 appreciated  Respiratory: Clear to auscultation bilaterally, no wheezing, no crackles, no rhonchi  Abdomen: Soft, non tender, non distended, bowel sounds present  Extremities: No edema, pulses DP and PT palpable bilaterally  Neuro: Grossly nonfocal  Data Reviewed: Basic Metabolic Panel:  Recent Labs Lab 01/14/15 2111 01/15/15 0350  NA 134* 133*  K 4.0 3.8  CL 103 102  CO2 27 26  GLUCOSE 108* 100*  BUN 16 16  CREATININE 0.74 0.74  CALCIUM 8.7 8.2*  MG  --  2.3  PHOS  --  3.0   Liver Function Tests:  Recent Labs Lab 01/14/15 2111 01/15/15 0350  AST 27 24  ALT 21 20  ALKPHOS 67 62  BILITOT 0.8 0.8  PROT 6.8 6.2  ALBUMIN 4.0 3.5   No results for input(s): LIPASE, AMYLASE in the last 168 hours. No results for input(s): AMMONIA in the last 168 hours. CBC:  Recent Labs Lab 01/14/15 2111 01/15/15 0350  WBC 8.6 7.3  HGB 12.3* 11.7*  HCT 38.3* 37.2*  MCV 84.9 84.9  PLT 322 299   Cardiac Enzymes: No results for input(s): CKTOTAL, CKMB, CKMBINDEX, TROPONINI in the last 168  hours. BNP: Invalid input(s): POCBNP CBG:  Recent Labs Lab 01/14/15 2137  GLUCAP 108*    Recent Results (from the past 240 hour(s))  Culture, blood (routine x 2)     Status: None (Preliminary result)   Collection Time: 01/15/15  3:30 AM  Result Value Ref Range Status   Specimen Description BLOOD RIGHT ARM  Final   Special Requests BOTTLES DRAWN AEROBIC AND ANAEROBIC 5CC  Final   Culture   Final           BLOOD CULTURE RECEIVED NO GROWTH TO DATE     Report Status PENDING  Incomplete  Culture, blood (routine x 2)     Status: None (Preliminary result)   Collection Time: 01/15/15  3:50 AM  Result Value Ref Range Status   Specimen Description BLOOD RIGHT HAND  Final  Special Requests BOTTLES DRAWN AEROBIC AND ANAEROBIC 10CC  Final   Culture   Final           BLOOD CULTURE RECEIVED NO GROWTH TO DATE     Report Status PENDING  Incomplete     Scheduled Meds: . antiseptic oral rinse  7 mL Mouth Rinse BID  . aspirin EC  81 mg Oral Daily  . carbidopa-levodopa  2 tablet Oral 3 times per day  . dapsone  50 mg Oral Daily  . docusate sodium  100 mg Oral BID  . donepezil  10 mg Oral Daily  . enoxaparin (LOVENOX) injection  40 mg Subcutaneous Q24H  . entacapone  200 mg Oral TID  . escitalopram  10 mg Oral Daily  . ezetimibe  10 mg Oral Daily  . guaiFENesin  600 mg Oral BID  . mirabegron ER  50 mg Oral Daily  . piperacillin-tazobactam (ZOSYN)  IV  3.375 g Intravenous 3 times per day  . QUEtiapine  25 mg Oral QHS  . senna  1 tablet Oral BID  . tamsulosin  0.4 mg Oral Daily  . vancomycin  1,000 mg Intravenous Q12H   Continuous Infusions:

## 2015-01-16 NOTE — Evaluation (Signed)
Physical Therapy Evaluation Patient Details Name: [name] MRN: [id number] DOB: 1941-05-01 Today's Date: 01/16/2015   History of Present Illness  patient is 74 yo male admitted 3/6/`16 with  constipation, fall,  fever  who has h/o Parkinson's disease, neurotransmiter implant, H/O afib  Clinical Impression  Patient  Is unsteady while ambulating, requiring frequent cues for  Safety. Wife present and awrte of need for assistance for any mobility at this time. Wife declines SNF rehab and HHPT. Wants patient to go to his gym. Patient will benefit from PT to address problems listed in note below.    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 )    Equipment Recommendations  None recommended by PT    Recommendations for Other Services       Precautions / Restrictions Precautions Precautions: Fall      Mobility  Bed Mobility Overal bed mobility: Needs Assistance Bed Mobility: Supine to Sit     Supine to sit: Min assist     General bed mobility comments: extra time and frequent cues to follow through with activity  Transfers Overall transfer level: Needs assistance Equipment used: Rolling walker (2 wheeled) Transfers: Sit to/from Stand Sit to Stand: Mod assist;From elevated surface         General transfer comment: from elevated bed ansd # in 1 with mod assist m frequent cues for safety, hand use. Wife present to giive cues as patient had been trained on REHAB in past.  Ambulation/Gait Ambulation/Gait assistance: Mod assist Ambulation Distance (Feet): 15 Feet (x2) Assistive device: Rolling walker (2 wheeled) Gait Pattern/deviations: Step-to pattern;Staggering right;Staggering left;Decreased weight shift to right;Decreased weight shift to left Gait velocity: slow   General Gait Details: needs cues and assist  for  manging RW and turning and to reach back for recliner and armrests.  Stairs             Wheelchair Mobility    Modified Rankin (Stroke Patients Only)       Balance                                             Pertinent Vitals/Pain Pain Assessment: No/denies pain    Home Living Family/patient expects to be discharged to:: Private residence Living Arrangements: Spouse/significant other Available Help at Discharge: Family;Personal care attendant;Available 24 hours/day Type of Home: House Home Access: Stairs to enter Entrance Stairs-Rails: Right Entrance Stairs-Number of Steps: 2-3 Home Layout: One level Home Equipment: Walker - 4 wheels;Shower seat;Toilet riser Additional Comments: PCA 6 hours 2 x week    Prior Function Level of Independence: Needs assistance   Gait / Transfers Assistance Needed: ambulates in house mod I.  ADL's / Homemaking Assistance Needed: assist for shower and dressingn Personal care  attendent  assists        Hand Dominance   Dominant Hand: Right    Extremity/Trunk Assessment   Upper Extremity Assessment: Overall WFL for tasks assessed (noted some tremors in hands.)           Lower Extremity Assessment: RLE deficits/detail;LLE deficits/detail RLE Deficits / Details: WFL but moted extraneous movements, conatantly moving  LLE Deficits / Details: same as R  Cervical / Trunk Assessment: Kyphotic  Communication      Cognition Arousal/Alertness: Awake/alert Behavior During Therapy: WFL for tasks  assessed/performed Overall Cognitive Status: Impaired/Different from baseline Area of Impairment: Safety/judgement;Problem solving;Awareness                    General Comments      Exercises        Assessment/Plan    PT Assessment Patient needs continued PT services  PT Diagnosis Difficulty walking   PT Problem List Decreased strength;Decreased activity tolerance;Decreased balance;Decreased mobility;Decreased knowledge of precautions;Decreased safety awareness;Decreased knowledge of use  of DME  PT Treatment Interventions DME instruction;Gait training;Stair training;Functional mobility training;Therapeutic activities;Therapeutic exercise;Patient/family education   PT Goals (Current goals can be found in the Care Plan section) Acute Rehab PT Goals Patient Stated Goal: per wife to go home, back to the gym. PT Goal Formulation: With patient/family Time For Goal Achievement: 01/30/15 Potential to Achieve Goals: Good    Frequency Min 3X/week   Barriers to discharge        Co-evaluation               End of Session Equipment Utilized During Treatment: Gait belt Activity Tolerance: Patient tolerated treatment well Patient left: in chair;with call bell/phone within reach;with chair alarm set;with family/visitor present Nurse Communication: Mobility status         Time: 1914-7829 PT Time Calculation (min) (ACUTE ONLY): 37 min   Charges:   PT Evaluation $Initial PT Evaluation Tier I: 1 Procedure PT Treatments $Gait Training: 23-37 mins   PT G CodesRada [name] 01/16/2015, 3:06 PM [name] PT [phone]

## 2015-01-16 NOTE — Progress Notes (Signed)
RT went to set up CPAP machine and attempted to place mask on patient and patient shook his head no. RN aware.

## 2015-01-17 DIAGNOSIS — I2510 Atherosclerotic heart disease of native coronary artery without angina pectoris: Secondary | ICD-10-CM

## 2015-01-17 LAB — VANCOMYCIN, TROUGH: Vancomycin Tr: 11 ug/mL (ref 10.0–20.0)

## 2015-01-17 MED ORDER — VANCOMYCIN HCL IN DEXTROSE 1-5 GM/200ML-% IV SOLN
1000.0000 mg | Freq: Three times a day (TID) | INTRAVENOUS | Status: DC
  Filled 2015-01-17: qty 200

## 2015-01-17 MED ORDER — GUAIFENESIN ER 600 MG PO TB12: 600.0000 mg | ORAL_TABLET | Freq: Two times a day (BID) | ORAL | Status: DC

## 2015-01-17 MED ORDER — DOCUSATE SODIUM 100 MG PO CAPS: 100.0000 mg | ORAL_CAPSULE | Freq: Two times a day (BID) | ORAL | Status: DC | PRN

## 2015-01-17 MED ORDER — ENTACAPONE 200 MG PO TABS: 200.0000 mg | ORAL_TABLET | Freq: Three times a day (TID) | ORAL | Status: DC

## 2015-01-17 MED ORDER — LIP MEDEX EX OINT
TOPICAL_OINTMENT | CUTANEOUS | Status: AC
  Administered 2015-01-17: 10:00:00
  Filled 2015-01-17: qty 7

## 2015-01-17 MED ORDER — LEVOFLOXACIN 750 MG PO TABS: 750.0000 mg | ORAL_TABLET | Freq: Every day | ORAL | Status: DC

## 2015-01-17 NOTE — Progress Notes (Signed)
ANTIBIOTIC CONSULT NOTE - FOLLOW UP  Pharmacy Consult for vancomycin Indication: possible aspiration pneumonia  Allergies  Allergen Reactions  . Crestor [Rosuvastatin Calcium] Other (See Comments)    Reaction:  Muscle weakness   . Rosuvastatin Dermatitis and Other (See Comments)    Reaction:  Muscle weakness   . Bactrim Rash  . Gluten Rash and Other (See Comments)    Pts wife states that he can eat some gluten.      Patient Measurements: Height: 5\' 11"  (180.3 cm) Weight: 177 lb 0.5 oz (80.3 kg) IBW/kg (Calculated) : 75.3 Adjusted Body Weight:   Vital Signs: Temp: 98 F (36.7 C) (03/09 0157) Temp Source: Axillary (03/09 0157) BP: 114/77 mmHg (03/09 0157) Pulse Rate: 70 (03/09 0157) Intake/Output from previous day: 03/08 0701 - 03/09 0700 In: 780 [P.O.:480; IV Piggyback:300] Out: 1701 [Urine:1700; Stool:1] Intake/Output from this shift: Total I/O In: 300 [IV Piggyback:300] Out: 550 [Urine:550]  Labs:  Recent Labs  01/14/15 2111 01/15/15 0350  WBC 8.6 7.3  HGB 12.3* 11.7*  PLT 322 299  CREATININE 0.74 0.74   Estimated Creatinine Clearance: 83.7 mL/min (by C-G formula based on Cr of 0.74).  Recent Labs  01/17/15 0525  VANCOTROUGH 11.0     Microbiology: Recent Results (from the past 720 hour(s))  Culture, blood (routine x 2)     Status: None (Preliminary result)   Collection Time: 01/15/15  3:30 AM  Result Value Ref Range Status   Specimen Description BLOOD RIGHT ARM  Final   Special Requests BOTTLES DRAWN AEROBIC AND ANAEROBIC 5CC  Final   Culture   Final           BLOOD CULTURE RECEIVED NO GROWTH TO DATE CULTURE WILL BE HELD FOR 5 DAYS BEFORE ISSUING A FINAL NEGATIVE REPORT Performed at [facility]    Report Status PENDING  Incomplete  Culture, blood (routine x 2)     Status: None (Preliminary result)   Collection Time: 01/15/15  3:50 AM  Result Value Ref Range Status   Specimen Description BLOOD RIGHT HAND  Final   Special Requests  BOTTLES DRAWN AEROBIC AND ANAEROBIC 10CC  Final   Culture   Final           BLOOD CULTURE RECEIVED NO GROWTH TO DATE CULTURE WILL BE HELD FOR 5 DAYS BEFORE ISSUING A FINAL NEGATIVE REPORT Performed at [facility]    Report Status PENDING  Incomplete    Anti-infectives    Start     Dose/Rate Route Frequency Ordered Stop   01/17/15 1400  vancomycin (VANCOCIN) IVPB 1000 mg/200 mL premix     1,000 mg 200 mL/hr over 60 Minutes Intravenous Every 8 hours 01/17/15 0637     01/15/15 1800  vancomycin (VANCOCIN) IVPB 1000 mg/200 mL premix     1,000 mg 200 mL/hr over 60 Minutes Intravenous Every 12 hours 01/15/15 0325     01/15/15 1000  dapsone tablet 50 mg     50 mg Oral Daily 01/15/15 0257     01/15/15 0330  vancomycin (VANCOCIN) IVPB 1000 mg/200 mL premix     1,000 mg 200 mL/hr over 60 Minutes Intravenous  Once 01/15/15 0325 01/15/15 0544   01/15/15 0300  piperacillin-tazobactam (ZOSYN) IVPB 3.375 g     3.375 g 12.5 mL/hr over 240 Minutes Intravenous 3 times per day 01/15/15 0257        Assessment: Vancomycin level below goal.    Goal of Therapy:  Vancomycin trough level 15-20 mcg/ml  Plan:  Measure antibiotic drug levels at steady state Follow up culture results Change vancomycin to 1gm iv q8hr  Darlina Guys, Jacquenette Shone Crowford 01/17/2015,6:37 AM

## 2015-01-17 NOTE — Progress Notes (Signed)
Discharge instruction given to pt/spouse, verbalized understanding. Left the unit in stable condition.

## 2015-01-17 NOTE — Discharge Summary (Signed)
Physician Discharge Summary  Patient ID: [name] MRN: [id number] DOB/AGE: 76-20-1942 76 y.o.  Admit date: 01/14/2015 Discharge date: 01/17/2015  Primary Care Physician:  Neena Rhymes, MD  Discharge Diagnoses:    . acute encephalopathy, underlying history of vascular dementia and Parkinson's disease . Possible aspiration pneumonia  . Dementia . CAD (coronary artery disease) . Atrial fibrillation . Cardiac pacemaker in situ . Hyperlipidemia . Idiopathic Parkinson's disease . Obstructive apnea  Consults:  None   Recommendations for Outpatient Follow-up:   patient was placed on oral Levaquin for 5 more days to complete the full course.    DIET: dysphagia 3 diet with chopped meats, thin liquids    Allergies:   Allergies  Allergen Reactions  . Crestor [Rosuvastatin Calcium] Other (See Comments)    Reaction:  Muscle weakness   . Rosuvastatin Dermatitis and Other (See Comments)    Reaction:  Muscle weakness   . Bactrim Rash  . Gluten Rash and Other (See Comments)    Pts wife states that he can eat some gluten.       Discharge Medications:   Medication List    TAKE these medications        aspirin 81 MG tablet  Take 81 mg by mouth daily.     Calcium-Vitamin D 600-400 MG-UNIT Tabs  Take 2 tablets by mouth daily.     carbidopa-levodopa 25-100 MG per tablet  Commonly known as:  SINEMET IR  Take 1 tablet by mouth at bedtime.     carbidopa-levodopa-entacapone 50-200-200 MG per tablet  Commonly known as:  STALEVO  Take 1 tablet by mouth 3 (three) times daily.     dapsone 25 MG tablet  Take 50 mg by mouth every morning.     docusate sodium 100 MG capsule  Commonly known as:  COLACE  Take 1 capsule (100 mg total) by mouth 2 (two) times daily as needed for mild constipation.     donepezil 10 MG tablet  Commonly known as:  ARICEPT  Take 10 mg by mouth daily.     entacapone 200 MG tablet  Commonly known as:  COMTAN  Take 1 tablet (200 mg total) by  mouth 3 (three) times daily.     escitalopram 10 MG tablet  Commonly known as:  LEXAPRO  Take 1 tablet (10 mg total) by mouth daily.     guaiFENesin 600 MG 12 hr tablet  Commonly known as:  MUCINEX  Take 1 tablet (600 mg total) by mouth 2 (two) times daily.     levofloxacin 750 MG tablet  Commonly known as:  LEVAQUIN  Take 1 tablet (750 mg total) by mouth daily. X 5 days     Magnesium 400 MG Caps  Take 400 mg by mouth daily.     mirabegron ER 50 MG Tb24 tablet  Commonly known as:  MYRBETRIQ  Take 1 tablet (50 mg total) by mouth daily.     nitroGLYCERIN 0.4 MG SL tablet  Commonly known as:  NITROSTAT  Place 0.4 mg under the tongue every 5 (five) minutes as needed for chest pain.     QUEtiapine 25 MG tablet  Commonly known as:  SEROQUEL  Take 25 mg by mouth at bedtime.     tamsulosin 0.4 MG Caps capsule  Commonly known as:  FLOMAX  Take 1 capsule (0.4 mg total) by mouth daily.     vitamin B-12 1000 MCG tablet  Commonly known as:  CYANOCOBALAMIN  Take 1 tablet (1,000 mcg  total) by mouth daily.     vitamin C 500 MG tablet  Commonly known as:  ASCORBIC ACID  Take 500 mg by mouth 2 (two) times daily.     ZETIA 10 MG tablet  Generic drug:  ezetimibe  TAKE 1 TABLET BY MOUTH DAILY     zinc gluconate 50 MG tablet  Take 50 mg by mouth daily.         Brief H and P: For complete details please refer to admission H and P, but in brief 74 year old male with past medical history of Parkinson's disease, dementia, atrial fibrillation, status post pacemaker placement, coronary artery disease, dyslipidemia. Patient presented to [facility] with confusion, fevers and per family report coughing especially after eating over past day or so prior to this admission. Patient's family also reported that patient has been constipated for past couple of days. On admission, patient was hemodynamically stable. His chest x-ray did not show acute cardiopulmonary findings. Apparently  patient had fever on arrival to ED which was up to 101.1F. He was admitted for further fever workup.  Hospital Course:  Acute encephalopathy / possible aspiration pneumonia - Likely secondary to worsening dementia or possible pneumonia, aspiration pneumonitis. Patient was placed on IV vancomycin and Zosyn. Patient remained afebrile, no significant worsening in the respiratory status. Antibiotics changed to Levaquin at the time of discharge. Patient was evaluated by speech therapy and recommended. Patient was evaluated by physical therapy and recommended no PT follow-up. Patient's wife declined skilled nursing facility for rehabilitation and home health PT.   Atrial fibrillation / Cardiac pacemaker in situ - Rate controlled. Patient is not a candidate for anticoagulation because of history of dementia and frequent falls. 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 CVS: S1-S2 clear no murmur rubs or gallops Chest: clear to auscultation bilaterally, no wheezing rales or rhonchi Abdomen: soft nontender, nondistended, normal bowel sounds Extremities: no cyanosis, clubbing or edema noted bilaterally Neuro: Cranial nerves II-XII intact, no focal neurological deficits   The results of significant diagnostics from this hospitalization (including imaging, microbiology,  ancillary and laboratory) are listed below for reference.    LAB RESULTS: Basic Metabolic Panel:  Recent Labs Lab 01/14/15 2111 01/15/15 0350  NA 134* 133*  K 4.0 3.8  CL 103 102  CO2 27 26  GLUCOSE 108* 100*  BUN 16 16  CREATININE 0.74 0.74  CALCIUM 8.7 8.2*  MG  --  2.3  PHOS  --  3.0   Liver Function Tests:  Recent Labs Lab 01/14/15 2111 01/15/15 0350  AST 27 24  ALT 21 20  ALKPHOS 67 62  BILITOT 0.8 0.8  PROT 6.8 6.2  ALBUMIN 4.0 3.5   No results for input(s): LIPASE, AMYLASE in the last 168 hours. No results for input(s): AMMONIA  in the last 168 hours. CBC:  Recent Labs Lab 01/14/15 2111 01/15/15 0350  WBC 8.6 7.3  HGB 12.3* 11.7*  HCT 38.3* 37.2*  MCV 84.9 84.9  PLT 322 299   Cardiac Enzymes: No results for input(s): CKTOTAL, CKMB, CKMBINDEX, TROPONINI in the last 168 hours. BNP: Invalid input(s): POCBNP CBG:  Recent Labs Lab 01/14/15 2137  GLUCAP 108*    Significant Diagnostic Studies:  Dg Chest 2 View  01/14/2015   CLINICAL DATA:  Fever and prolonged cough.  EXAM: CHEST  2 VIEW  COMPARISON:  05/06/2014  FINDINGS: Right-sided dual lead pacemaker remains in place. Left-sided stimulator battery pack over the chest wall. Cardiomegaly is unchanged. Low lung volumes persist. Minimal atelectasis or scarring at the left costophrenic angle. No consolidation, large pleural effusion or pneumothorax. The bones are under mineralized, no acute osseous abnormalities are seen.  IMPRESSION: Stable cardiomegaly.  No acute pulmonary process.   Electronically Signed   By: Rubye Oaks M.D.   On: 01/14/2015 22:20   Ct Head Wo Contrast  01/15/2015   CLINICAL DATA:  Confusion and weakness, unable to ambulate for 1 day. History of deep brain stimulator, frequent falls. Evaluate acute encephalopathy.  EXAM: CT HEAD WITHOUT CONTRAST  TECHNIQUE: Contiguous axial images were obtained from the base of the skull through the vertex without intravenous contrast.  COMPARISON:   CT of the head May 05, 2014  FINDINGS: Streak artifact from bilateral deep brain stimulators cauda distal tips in the region of the substantia nigra. RIGHT caudate hypodensity may reflect remote lacunar infarct or, artifact. Small amount of bifrontal encephalomalacia may be postprocedural. No intraparenchymal hemorrhage, mass effect, midline shift. No acute large vascular territory infarct.  Moderate ventriculomegaly, likely on the basis of global parenchymal brain volume loss as there is overall commensurate enlargement of cerebral sulci and cerebellar folia, unchanged. No abnormal extra-axial fluid collections. Basal cisterns are patent.  Status post bilateral ocular lens implants. Mild temporomandibular osteoarthrosis. Bifrontal burr holes, stimulator leads course in the LEFT neck. Mild paranasal sinus mucosal thickening, less sphenoid mucosal retention cyst without paranasal sinus air-fluid levels. The mastoid air cells are well aerated.  IMPRESSION: No acute intracranial process, stable appearance of the head from May 05, 2014.  Bilateral deep brain stimulators.   Electronically Signed   By: Awilda Metro   On: 01/15/2015 02:53   Dg Abd 2 Views  01/14/2015   CLINICAL DATA:  Constipation for 5 days, fever.  EXAM: ABDOMEN - 2 VIEW  COMPARISON:  None.  FINDINGS: No free intra-abdominal air. There is increased air throughout normal caliber small bowel loops in the central and right abdomen with a few air-fluid levels. Moderate stool throughout the colon with stool ball distending the rectum. Pelvic phleboliths are noted. Scoliotic curvature and multilevel did change is seen in the spine.  IMPRESSION: 1. Moderate stool throughout the colon with stool ball distending the rectum, can be seen in the setting of constipation. 2. Increased air throughout normal caliber small bowel loops in the central in right abdomen, may reflect enteritis or ileus. No free air.   Electronically Signed   By: Rubye Oaks  M.D.   On: 01/14/2015 22:23    2D ECHO:   Disposition and Follow-up: Discharge Instructions    Discharge instructions    Complete by:  As directed   Diet: SOFT DIET     Increase activity slowly    Complete by:  As directed  DISPOSITION: home   DISCHARGE FOLLOW-UP Follow-up Information    Follow up with Neena Rhymes, MD On 01/26/2015.   Specialty:  Family Medicine   Why:  Follow up appt is with Dr. Beverely Low on Friday, March 18th at [facility] information:   [address] [place] [address] [phone]        Time spent on Discharge: 35 mins  Signed:   RAI,RIPUDEEP M.D. [facility] 01/17/2015, 1:06 PM Pager: [phone]

## 2015-01-17 NOTE — Progress Notes (Signed)
RT placed CPAP on patient and stated he wanted it taken off. RT will continue to assess and monitor. RN aware.

## 2015-01-19 ENCOUNTER — Encounter: Payer: Self-pay | Admitting: Family Medicine

## 2015-01-19 ENCOUNTER — Ambulatory Visit (INDEPENDENT_AMBULATORY_CARE_PROVIDER_SITE_OTHER): Payer: Medicare Other | Admitting: Family Medicine

## 2015-01-19 ENCOUNTER — Telehealth: Payer: Self-pay

## 2015-01-19 ENCOUNTER — Telehealth: Payer: Self-pay | Admitting: Family Medicine

## 2015-01-19 VITALS — BP 125/86 | HR 78 | Temp 98.1°F | Ht 71.0 in | Wt 172.4 lb

## 2015-01-19 DIAGNOSIS — J690 Pneumonitis due to inhalation of food and vomit: Secondary | ICD-10-CM | POA: Diagnosis not present

## 2015-01-19 DIAGNOSIS — M7022 Olecranon bursitis, left elbow: Secondary | ICD-10-CM | POA: Diagnosis not present

## 2015-01-19 DIAGNOSIS — B002 Herpesviral gingivostomatitis and pharyngotonsillitis: Secondary | ICD-10-CM

## 2015-01-19 DIAGNOSIS — L130 Dermatitis herpetiformis: Secondary | ICD-10-CM | POA: Diagnosis not present

## 2015-01-19 HISTORY — DX: Herpesviral gingivostomatitis and pharyngotonsillitis: B00.2

## 2015-01-19 HISTORY — DX: Olecranon bursitis, left elbow: M70.22

## 2015-01-19 MED ORDER — VALACYCLOVIR HCL 1 G PO TABS: 1000.0000 mg | ORAL_TABLET | Freq: Three times a day (TID) | ORAL | Status: DC

## 2015-01-19 MED ORDER — LIDOCAINE VISCOUS 2 % MT SOLN: OROMUCOSAL | Status: DC

## 2015-01-19 NOTE — Telephone Encounter (Signed)
Admit date: 01/14/2015 Discharge date: 01/17/2015  Reason for admission:  acute encephalopathy, underlying history of vascular dementia and Parkinson's disease, confusion, weakness, fever, cough (possible aspiration pneumonia), and constipation.    Information below provided by wife.   Transition Care Management Follow-up Telephone Call  How have you been since you were released from the hospital? Alert and oriented, afebrile, constipation has resolved with stool softners, no diarrhea.   However pt has had a lack of appetite, has fever blisters on mouth and tongue and has a fluid build up on left elbow due to recent fall.   Wife states that husband says fever blisters are really uncomfortable and makes it hard to eat.  Wife made garlic paste to help.  Improved, but blisters are still sore and make it hard for him to eat.  Wife would like for patient to be seen today.    Do you understand why you were in the hospital? no   Do you understand the discharge instructions? yes  Items Reviewed:  Medications reviewed: yes  Allergies reviewed: yes  Dietary changes reviewed: yes, soft diet  Referrals reviewed: n/a   Functional Questionnaire:   Activities of Daily Living (ADLs):  Wife assists with care   Any transportation issues/concerns?: no   Any patient concerns? See above   Confirmed importance and date/time of follow-up visits scheduled: yes   Confirmed with patient if condition begins to worsen call PCP or go to the ER: yes

## 2015-01-19 NOTE — Progress Notes (Signed)
   Subjective:    Patient ID: [name], male    DOB: 03-26-1941, 74 y.o.   MRN: [id number]  HPI Hospital f/u- pt was admitted 3/6-3/9 for presumed aspiration PNA.  Pt is completing course of Levaquin after transitioning from Vanc and Zosyn.  Pt fell ~1 week ago, banging L elbow.  Area is now very swollen, not painful.  No redness.  Also developed painful blisters on lips starting yesterday.  Complained of tongue hurting this AM.  No fevers since admission.  Also has some red lesions on arms.  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  No cough, SOB.   Review of Systems For ROS see HPI     Objective:   Physical Exam  Constitutional: He appears well-developed and well-nourished.  HENT:  Head: Normocephalic and atraumatic.  Pt w/ lesions on lips, roof of mouth, and under tongue consistent w/ herpetic gingival stomatitis.  Cardiovascular: Normal rate, regular rhythm and normal heart sounds.   Pulmonary/Chest: Effort normal and breath sounds normal. No respiratory distress. He has no wheezes. He has no rales.  Musculoskeletal: He exhibits edema (large, soft, fluctuant L olecranon bursitis.  no TTP, no overlying redness). He exhibits no tenderness.  Skin: Skin is warm and dry. There is erythema (scattered erythematous lesions on pt's arms and trunk.  no TTP, blanching).  Vitals reviewed.         Assessment & Plan:

## 2015-01-19 NOTE — Telephone Encounter (Signed)
error 

## 2015-01-19 NOTE — Progress Notes (Signed)
Pre visit review using our clinic review tool, if applicable. No additional management support is needed unless otherwise documented below in the visit note. 

## 2015-01-19 NOTE — Patient Instructions (Signed)
Follow up as needed Start the Valtrex as directed for the mouth ulcers Use the lidocaine swish and spit for pain Please watch the elbow for redness or increased pain Alternate ice and heat to improve the elbow swelling Try and avoid pressure on the low back- call if any break in the skin Finish the Levaquin as directed Call with any questions or concerns Hang in there!!!

## 2015-01-19 NOTE — Telephone Encounter (Signed)
error:[id number] ° °

## 2015-01-21 DIAGNOSIS — J690 Pneumonitis due to inhalation of food and vomit: Secondary | ICD-10-CM | POA: Insufficient documentation

## 2015-01-21 LAB — CULTURE, BLOOD (ROUTINE X 2)
Culture: NO GROWTH
Culture: NO GROWTH

## 2015-01-21 NOTE — Assessment & Plan Note (Signed)
New to provider.  Suspect his non-tender erythematous lesions on arms and trunk are due to his recent gluten exposure in the hospital.  Reviewed supportive care and red flags that should prompt return.  Pt expressed understanding and is in agreement w/ plan.

## 2015-01-21 NOTE — Assessment & Plan Note (Signed)
New.  This was cause of pt's recent hospitalization.  Currently on Levaquin.  No fever, cough, SOB.  Pt is doing well at this time.  No changes to d/c instructions.

## 2015-01-21 NOTE — Assessment & Plan Note (Signed)
New.  Pt's oral lesions consistent w/ herpetic infxn.  Start Valtrx.  Viscous lido for symptom relief.  Cautioned about dehydration.  Reviewed supportive care and red flags that should prompt return.  Pt expressed understanding and is in agreement w/ plan.

## 2015-01-21 NOTE — Assessment & Plan Note (Signed)
New.  No evidence of cellulitis or septic joint- no TTP, no overlying redness.  Area is soft, fluctuant.  No need for ortho referral at this time- fluid will likely reabsorb on its own.  Reviewed supportive care and red flags that should prompt return.  Wife expressed understanding and is in agreement w/ plan.

## 2015-01-26 ENCOUNTER — Ambulatory Visit: Payer: Medicare Other | Admitting: Family Medicine

## 2015-02-21 ENCOUNTER — Other Ambulatory Visit: Payer: Self-pay | Admitting: Cardiology

## 2015-03-06 ENCOUNTER — Telehealth: Payer: Self-pay | Admitting: *Deleted

## 2015-03-06 ENCOUNTER — Encounter: Payer: Self-pay | Admitting: *Deleted

## 2015-03-06 NOTE — Telephone Encounter (Signed)
error 

## 2015-03-06 NOTE — Addendum Note (Signed)
Addended by: Noreene Larsson A on: 03/06/2015 05:02 PM   Modules accepted: Orders, Medications

## 2015-03-06 NOTE — Telephone Encounter (Signed)
Pre-Visit Call completed with patient and chart updated.   Pre-Visit Info documented in Specialty Comments under [employer].    

## 2015-03-06 NOTE — Telephone Encounter (Signed)
Unable to reach patient at time of Pre-Visit Call.  Left message for patient to return call when available.    

## 2015-03-07 ENCOUNTER — Ambulatory Visit (HOSPITAL_BASED_OUTPATIENT_CLINIC_OR_DEPARTMENT_OTHER)
Admission: RE | Admit: 2015-03-07 | Discharge: 2015-03-07 | Disposition: A | Discharge status: Home / Self Care | Payer: Medicare Other | Source: Ambulatory Visit | Attending: Family Medicine | Admitting: Family Medicine

## 2015-03-07 ENCOUNTER — Ambulatory Visit (INDEPENDENT_AMBULATORY_CARE_PROVIDER_SITE_OTHER): Payer: Medicare Other | Admitting: Family Medicine

## 2015-03-07 ENCOUNTER — Encounter: Payer: Self-pay | Admitting: Family Medicine

## 2015-03-07 VITALS — BP 122/84 | HR 93 | Temp 97.9°F | Resp 17 | Ht 71.0 in | Wt 174.5 lb

## 2015-03-07 DIAGNOSIS — Z0000 Encounter for general adult medical examination without abnormal findings: Secondary | ICD-10-CM

## 2015-03-07 DIAGNOSIS — R059 Cough, unspecified: Secondary | ICD-10-CM

## 2015-03-07 DIAGNOSIS — I4891 Unspecified atrial fibrillation: Secondary | ICD-10-CM | POA: Diagnosis not present

## 2015-03-07 DIAGNOSIS — G20 Parkinson's disease: Secondary | ICD-10-CM

## 2015-03-07 DIAGNOSIS — J449 Chronic obstructive pulmonary disease, unspecified: Secondary | ICD-10-CM | POA: Insufficient documentation

## 2015-03-07 DIAGNOSIS — E538 Deficiency of other specified B group vitamins: Secondary | ICD-10-CM | POA: Diagnosis not present

## 2015-03-07 DIAGNOSIS — E785 Hyperlipidemia, unspecified: Secondary | ICD-10-CM | POA: Diagnosis not present

## 2015-03-07 DIAGNOSIS — F0390 Unspecified dementia without behavioral disturbance: Secondary | ICD-10-CM | POA: Diagnosis not present

## 2015-03-07 DIAGNOSIS — R05 Cough: Secondary | ICD-10-CM

## 2015-03-07 NOTE — Progress Notes (Signed)
Pre visit review using our clinic review tool, if applicable. No additional management support is needed unless otherwise documented below in the visit note. 

## 2015-03-07 NOTE — Progress Notes (Signed)
   Subjective:    Patient ID: [name], male    DOB: May 21, 1941, 74 y.o.   MRN: [id number]  HPI Here today for CPE.  Risk Factors: Afib- chronic problem, following w/ Cardiology Hyperlipidemia- chronic problem, on Zetia per Dr Patty Sermons Parkinson's- chronic problem, following w/ Dr Rubin Payor.  Has upcoming appt in May B12 deficiency- taking oral supplement daily.  Due for labs. Dementia- chronic problem, pt has difficult time answering question.  On Aricept and Seroquel Physical Activity: very limited Fall Risk: high Depression: denies current sxs of depression Hearing: decreased to conversational tones and whispered voice ADL's: dependent on wife Cognitive: memory deficits (known dementia), unable to answer questions appropriately today Home Safety: safe at home, lives w/ wife (primary caregiver) Height, Weight, BMI, Visual Acuity: see vitals, vision corrected to 20/20 w/ glasses Counseling: due for colonoscopy but pt unable to have due to DBS.  Open to Cologuard. UTD on urology (Dr Sherron Monday) Labs Ordered: See A&P Care Plan: See A&P    Review of Systems Patient reports no vision/hearing changes, anorexia, fever ,adenopathy, persistant/recurrent hoarseness, chest pain, palpitations, edema, hemoptysis, dyspnea (rest,exertional, paroxysmal nocturnal), gastrointestinal  bleeding (melena, rectal bleeding), abdominal pain, excessive heart burn, GU symptoms (dysuria, hematuria, voiding/incontinence issues) syncope, memory loss, numbness & tingling, skin/hair/nail changes, depression, anxiety, abnormal bruising/bleeding, musculoskeletal symptoms/signs.   + cough + dysphagia + muscle weakness    Objective:   Physical Exam BP 122/84 mmHg  Pulse 93  Temp(Src) 97.9 F (36.6 C) (Oral)  Resp 17  Ht 5\' 11"  (1.803 m)  Wt 174 lb 8 oz (79.153 kg)  BMI 24.35 kg/m2  SpO2 60%  General Appearance:    Alert but slumped in chair  Head:    Normocephalic, without obvious abnormality,  atraumatic  Eyes:    PERRL, conjunctiva/corneas clear, EOM's intact, fundi    benign, both eyes       Ears:    Normal TM's and external ear canals, both ears  Nose:   Nares normal, septum midline, mucosa normal, no drainage   or sinus tenderness  Throat:   Lips, mucosa, and tongue normal; teeth and gums normal  Neck:   Supple, symmetrical, trachea midline, no adenopathy;       thyroid:  No enlargement/tenderness/nodules  Back:     Symmetric, no curvature, ROM normal, no CVA tenderness  Lungs:     Clear to auscultation bilaterally, respirations unlabored  Chest wall:    No tenderness or deformity  Heart:    Irregular S1/S2  Abdomen:     Soft, non-tender, bowel sounds active all four quadrants,    no masses, no organomegaly  Genitalia:    Deferred to urology  Rectal:    Extremities:   Extremities normal, atraumatic, no cyanosis or edema  Pulses:   2+ and symmetric all extremities  Skin:   Skin color, texture, turgor normal, no rashes or lesions  Lymph nodes:   Cervical, supraclavicular, and axillary nodes normal  Neurologic:   Slumped in chair, mild Parkinson's tremor noted          Assessment & Plan:

## 2015-03-07 NOTE — Patient Instructions (Signed)
Follow up in 1 year or as needed We'll notify you of your lab results and make any changes if needed Go downstairs and get your xray Complete the cologuard as directed Call with any questions or concerns Hang in there!!!

## 2015-03-08 LAB — CBC WITH DIFFERENTIAL/PLATELET
BASOS ABS: 0 10*3/uL (ref 0.0–0.1)
Basophils Relative: 0.6 % (ref 0.0–3.0)
Eosinophils Absolute: 0.1 10*3/uL (ref 0.0–0.7)
Eosinophils Relative: 2.3 % (ref 0.0–5.0)
HCT: 40.1 % (ref 39.0–52.0)
HEMOGLOBIN: 13 g/dL (ref 13.0–17.0)
LYMPHS ABS: 1.5 10*3/uL (ref 0.7–4.0)
Lymphocytes Relative: 30.3 % (ref 12.0–46.0)
MCHC: 32.3 g/dL (ref 30.0–36.0)
MCV: 84.2 fl (ref 78.0–100.0)
Monocytes Absolute: 0.5 10*3/uL (ref 0.1–1.0)
Monocytes Relative: 9.4 % (ref 3.0–12.0)
NEUTROS ABS: 2.9 10*3/uL (ref 1.4–7.7)
Neutrophils Relative %: 57.4 % (ref 43.0–77.0)
Platelets: 288 10*3/uL (ref 150.0–400.0)
RBC: 4.76 Mil/uL (ref 4.22–5.81)
RDW: 17.6 % — ABNORMAL HIGH (ref 11.5–15.5)
WBC: 5.1 10*3/uL (ref 4.0–10.5)

## 2015-03-08 LAB — TSH: TSH: 1.71 u[IU]/mL (ref 0.35–4.50)

## 2015-03-08 LAB — BASIC METABOLIC PANEL
BUN: 14 mg/dL (ref 6–23)
CO2: 32 mEq/L (ref 19–32)
CREATININE: 0.7 mg/dL (ref 0.40–1.50)
Calcium: 9.4 mg/dL (ref 8.4–10.5)
Chloride: 102 mEq/L (ref 96–112)
GFR: 116.46 mL/min (ref >60.00)
Glucose, Bld: 85 mg/dL (ref 70–99)
Potassium: 4.5 mEq/L (ref 3.5–5.1)
Sodium: 138 mEq/L (ref 135–145)

## 2015-03-08 LAB — HEPATIC FUNCTION PANEL
ALT: 11 U/L (ref 0–53)
AST: 17 U/L (ref 0–37)
Albumin: 4 g/dL (ref 3.5–5.2)
Alkaline Phosphatase: 69 U/L (ref 39–117)
BILIRUBIN DIRECT: 0.1 mg/dL (ref 0.0–0.3)
TOTAL PROTEIN: 6.7 g/dL (ref 6.0–8.3)
Total Bilirubin: 0.6 mg/dL (ref 0.2–1.2)

## 2015-03-08 LAB — LIPID PANEL
CHOL/HDL RATIO: 3
Cholesterol: 127 mg/dL (ref 0–200)
HDL: 45 mg/dL (ref >39.00)
LDL CALC: 68 mg/dL (ref 0–99)
NonHDL: 82
Triglycerides: 70 mg/dL (ref 0.0–149.0)
VLDL: 14 mg/dL (ref 0.0–40.0)

## 2015-03-08 LAB — VITAMIN B12: Vitamin B-12: >1500 pg/mL — ABNORMAL HIGH (ref 211–911)

## 2015-03-11 NOTE — Assessment & Plan Note (Signed)
Chronic problem.  Due for repeat labs.  Replete prn.

## 2015-03-11 NOTE — Assessment & Plan Note (Signed)
Chronic problem.  Has DBS.  Following w/ Neuro regularly.

## 2015-03-11 NOTE — Assessment & Plan Note (Signed)
Deteriorated.  Following w/ Neuro.  Today pt is unable to answer questions for himself and defers to wife for everything.

## 2015-03-11 NOTE — Assessment & Plan Note (Signed)
Chronic problem, following w/ Dr Ladona Ridgel.  Rate controlled.  Currently asymptomatic.

## 2015-03-11 NOTE — Assessment & Plan Note (Signed)
Pt's PE is unchanged from previous w/ exception of notable cognitive decline.  Pt unable to have colonoscopy- open to cologuard.  Ordered today.  UTD on urology.  Written screening schedule updated and given to pt.  Check labs.  Anticipatory guidance provided.

## 2015-03-11 NOTE — Assessment & Plan Note (Signed)
New. Pt has hx of aspiration PNA due to Parkinson's.  Due to ongoing cough and recent PNA- will get CXR to assess for recurrence.  Will hold on abx tx until results available.

## 2015-03-11 NOTE — Assessment & Plan Note (Signed)
Chronic problem.  Statin intolerant.  On Zetia.  Check labs.  Adjust prn.

## 2015-03-22 LAB — COLOGUARD: Cologuard: NEGATIVE

## 2015-03-28 ENCOUNTER — Ambulatory Visit (INDEPENDENT_AMBULATORY_CARE_PROVIDER_SITE_OTHER): Payer: Medicare Other | Admitting: *Deleted

## 2015-03-28 ENCOUNTER — Telehealth: Payer: Self-pay | Admitting: Cardiology

## 2015-03-28 DIAGNOSIS — I442 Atrioventricular block, complete: Secondary | ICD-10-CM | POA: Diagnosis not present

## 2015-03-28 NOTE — Telephone Encounter (Signed)
Confirmed remote transmission w/ pt wife.   

## 2015-03-28 NOTE — Progress Notes (Signed)
Remote pacemaker transmission.   

## 2015-04-04 LAB — CUP PACEART REMOTE DEVICE CHECK
Battery Impedance: 1331 Ohm
Battery Remaining Longevity: 34 mo
Brady Statistic AP VS Percent: 0 %
Date Time Interrogation Session: 20160518185139
Lead Channel Impedance Value: 497 Ohm
Lead Channel Pacing Threshold Amplitude: 1.5 V
Lead Channel Pacing Threshold Pulse Width: 0.4 ms
Lead Channel Pacing Threshold Pulse Width: 0.4 ms
Lead Channel Setting Pacing Amplitude: 2.5 V
Lead Channel Setting Pacing Amplitude: 3.25 V
Lead Channel Setting Pacing Pulse Width: 0.4 ms
Lead Channel Setting Sensing Sensitivity: 2.8 mV
MDC IDC MSMT BATTERY VOLTAGE: 2.75 V
MDC IDC MSMT LEADCHNL RA IMPEDANCE VALUE: 471 Ohm
MDC IDC MSMT LEADCHNL RV PACING THRESHOLD AMPLITUDE: 0.625 V
MDC IDC STAT BRADY AP VP PERCENT: 94 %
MDC IDC STAT BRADY AS VP PERCENT: 6 %
MDC IDC STAT BRADY AS VS PERCENT: 0 %

## 2015-04-09 LAB — COLOGUARD

## 2015-04-11 ENCOUNTER — Other Ambulatory Visit: Payer: Self-pay | Admitting: Cardiology

## 2015-04-11 ENCOUNTER — Encounter: Payer: Self-pay | Admitting: Cardiology

## 2015-04-18 ENCOUNTER — Encounter: Payer: Self-pay | Admitting: Internal Medicine

## 2015-04-19 ENCOUNTER — Encounter: Payer: Self-pay | Admitting: General Practice

## 2015-04-26 DIAGNOSIS — T859XXA Unspecified complication of internal prosthetic device, implant and graft, initial encounter: Secondary | ICD-10-CM | POA: Insufficient documentation

## 2015-04-26 DIAGNOSIS — T85199A Other mechanical complication of other implanted electronic stimulator of nervous system, initial encounter: Secondary | ICD-10-CM

## 2015-05-07 DIAGNOSIS — R4189 Other symptoms and signs involving cognitive functions and awareness: Secondary | ICD-10-CM | POA: Insufficient documentation

## 2015-05-15 ENCOUNTER — Ambulatory Visit (INDEPENDENT_AMBULATORY_CARE_PROVIDER_SITE_OTHER): Payer: Medicare Other | Admitting: Family Medicine

## 2015-05-15 ENCOUNTER — Encounter: Payer: Self-pay | Admitting: Family Medicine

## 2015-05-15 VITALS — BP 120/82 | HR 64 | Temp 97.9°F | Resp 16 | Ht 71.0 in | Wt 160.1 lb

## 2015-05-15 DIAGNOSIS — I4581 Long QT syndrome: Secondary | ICD-10-CM | POA: Diagnosis not present

## 2015-05-15 DIAGNOSIS — T8579XD Infection and inflammatory reaction due to other internal prosthetic devices, implants and grafts, subsequent encounter: Secondary | ICD-10-CM

## 2015-05-15 DIAGNOSIS — Z9889 Other specified postprocedural states: Secondary | ICD-10-CM

## 2015-05-15 DIAGNOSIS — T85738A Infection and inflammatory reaction due to other nervous system device, implant or graft, initial encounter: Secondary | ICD-10-CM | POA: Insufficient documentation

## 2015-05-15 DIAGNOSIS — R9431 Abnormal electrocardiogram [ECG] [EKG]: Secondary | ICD-10-CM

## 2015-05-15 DIAGNOSIS — Z9689 Presence of other specified functional implants: Secondary | ICD-10-CM

## 2015-05-15 DIAGNOSIS — G20A1 Parkinson's disease without dyskinesia, without mention of fluctuations: Secondary | ICD-10-CM

## 2015-05-15 DIAGNOSIS — T85738D Infection and inflammatory reaction due to other nervous system device, implant or graft, subsequent encounter: Secondary | ICD-10-CM

## 2015-05-15 DIAGNOSIS — G20 Parkinson's disease: Secondary | ICD-10-CM

## 2015-05-15 HISTORY — DX: Infection and inflammatory reaction due to other nervous system device, implant or graft, initial encounter: T85.738A

## 2015-05-15 NOTE — Progress Notes (Signed)
Pre visit review using our clinic review tool, if applicable. No additional management support is needed unless otherwise documented below in the visit note. 

## 2015-05-15 NOTE — Progress Notes (Signed)
   Subjective:    Patient ID: [name], male    DOB: 24-Apr-1941, 74 y.o.   MRN: [id number]  HPI Hospital f/u- pt was admitted to [facility] and had revision and replacement of DBS on 6/9.  Cultures grew S. lugdunensis and Enterobacter.  Pt completed course of Levaquin on 6/25.  Sutures were removed on 6/20-21.  His care team at [facility] recommended continued outpt PT, OT and speech.  Pt was supposed to have this through [facility].  Pt is now on Nectar Thick liquid.  No current signs of infection.  Pt reports feeling 'ok'.  Wife reports pt is walking 'pretty good' but is having difficulty w/ memory and cognitive issues.  Pt's Seroquel was stopped while he was on Levaquin due to prolonged QT interval but wife restarted Seroquel and Aricept when he got home on 6/30.  Pt is having some sundowning.   Review of Systems For ROS see HPI     Objective:   Physical Exam  Constitutional: He appears well-developed and well-nourished. No distress.  HENT:  Head: Normocephalic and atraumatic.  Neck: Normal range of motion. Neck supple.  Cardiovascular: Normal rate, regular rhythm, normal heart sounds and intact distal pulses.   Pulmonary/Chest: Effort normal and breath sounds normal. No respiratory distress. He has no wheezes. He has no rales.  Lymphadenopathy:    He has no cervical adenopathy.  Neurological: He is alert.  Oriented to person Very difficult time w/ speech  Skin: Skin is warm and dry. No erythema (well healing incisions).  Vitals reviewed.         Assessment & Plan:

## 2015-05-15 NOTE — Patient Instructions (Signed)
We'll forward a copy of today's visit and EKG to Dr Patty Sermons and determine when follow up is needed We sent a referral to Speech Therapy at [facility] Keep up the good work!  Thinks look good! Call with any questions or concerns Hang in there!!!

## 2015-05-17 ENCOUNTER — Ambulatory Visit: Payer: Medicare Other | Admitting: Family Medicine

## 2015-05-27 NOTE — Assessment & Plan Note (Signed)
New to provider.  Pt had infected stimulator removed, new one implanted, and completed course of abx.  Wife reports pt is at baseline.  Is following at [facility].

## 2015-05-27 NOTE — Assessment & Plan Note (Signed)
New.  Pt's seroquel and Aricept were held while pt was on Levaquin due to risk of prolonged QT.  EKG today shows prolonged QT but there is a lot of artifact due to DBS.  Will forward to cards for review and discussion w/ pt and wife at upcoming appt.

## 2015-05-27 NOTE — Assessment & Plan Note (Signed)
Following at [facility] 

## 2015-05-27 NOTE — Assessment & Plan Note (Signed)
Chronic problem.  Severe.  Has had multiple issues w/ DBS and infxn.  Following w/ [facility].  Is to have outpt speech therapy but has not heard from them to schedule an appt.  Will send new order.

## 2015-06-04 ENCOUNTER — Emergency Department (HOSPITAL_COMMUNITY): Payer: Medicare Other

## 2015-06-04 ENCOUNTER — Encounter (HOSPITAL_COMMUNITY): Payer: Self-pay | Admitting: Emergency Medicine

## 2015-06-04 ENCOUNTER — Inpatient Hospital Stay (HOSPITAL_COMMUNITY)
Admission: EM | Admit: 2015-06-04 | Discharge: 2015-06-07 | DRG: 177 | Disposition: A | Discharge status: Home Health | Payer: Medicare Other | Attending: Internal Medicine | Admitting: Internal Medicine

## 2015-06-04 ENCOUNTER — Telehealth: Payer: Self-pay | Admitting: Family Medicine

## 2015-06-04 DIAGNOSIS — I2510 Atherosclerotic heart disease of native coronary artery without angina pectoris: Secondary | ICD-10-CM | POA: Diagnosis present

## 2015-06-04 DIAGNOSIS — Z7982 Long term (current) use of aspirin: Secondary | ICD-10-CM

## 2015-06-04 DIAGNOSIS — R4182 Altered mental status, unspecified: Secondary | ICD-10-CM | POA: Diagnosis present

## 2015-06-04 DIAGNOSIS — I4581 Long QT syndrome: Secondary | ICD-10-CM | POA: Diagnosis present

## 2015-06-04 DIAGNOSIS — E785 Hyperlipidemia, unspecified: Secondary | ICD-10-CM | POA: Diagnosis present

## 2015-06-04 DIAGNOSIS — Z9861 Coronary angioplasty status: Secondary | ICD-10-CM

## 2015-06-04 DIAGNOSIS — I252 Old myocardial infarction: Secondary | ICD-10-CM

## 2015-06-04 DIAGNOSIS — G20 Parkinson's disease: Secondary | ICD-10-CM | POA: Diagnosis present

## 2015-06-04 DIAGNOSIS — Z66 Do not resuscitate: Secondary | ICD-10-CM | POA: Diagnosis present

## 2015-06-04 DIAGNOSIS — G4730 Sleep apnea, unspecified: Secondary | ICD-10-CM | POA: Diagnosis present

## 2015-06-04 DIAGNOSIS — K449 Diaphragmatic hernia without obstruction or gangrene: Secondary | ICD-10-CM | POA: Diagnosis present

## 2015-06-04 DIAGNOSIS — F329 Major depressive disorder, single episode, unspecified: Secondary | ICD-10-CM | POA: Diagnosis present

## 2015-06-04 DIAGNOSIS — L271 Localized skin eruption due to drugs and medicaments taken internally: Secondary | ICD-10-CM | POA: Diagnosis present

## 2015-06-04 DIAGNOSIS — R9431 Abnormal electrocardiogram [ECG] [EKG]: Secondary | ICD-10-CM | POA: Diagnosis present

## 2015-06-04 DIAGNOSIS — G9341 Metabolic encephalopathy: Secondary | ICD-10-CM | POA: Diagnosis present

## 2015-06-04 DIAGNOSIS — J690 Pneumonitis due to inhalation of food and vomit: Secondary | ICD-10-CM | POA: Diagnosis present

## 2015-06-04 DIAGNOSIS — R410 Disorientation, unspecified: Secondary | ICD-10-CM

## 2015-06-04 DIAGNOSIS — N400 Enlarged prostate without lower urinary tract symptoms: Secondary | ICD-10-CM | POA: Diagnosis present

## 2015-06-04 DIAGNOSIS — I10 Essential (primary) hypertension: Secondary | ICD-10-CM | POA: Diagnosis present

## 2015-06-04 DIAGNOSIS — Z950 Presence of cardiac pacemaker: Secondary | ICD-10-CM

## 2015-06-04 DIAGNOSIS — F0390 Unspecified dementia without behavioral disturbance: Secondary | ICD-10-CM | POA: Diagnosis present

## 2015-06-04 DIAGNOSIS — I4891 Unspecified atrial fibrillation: Secondary | ICD-10-CM | POA: Diagnosis present

## 2015-06-04 DIAGNOSIS — J189 Pneumonia, unspecified organism: Secondary | ICD-10-CM

## 2015-06-04 DIAGNOSIS — Z8249 Family history of ischemic heart disease and other diseases of the circulatory system: Secondary | ICD-10-CM

## 2015-06-04 DIAGNOSIS — T361X5A Adverse effect of cephalosporins and other beta-lactam antibiotics, initial encounter: Secondary | ICD-10-CM | POA: Diagnosis present

## 2015-06-04 DIAGNOSIS — Z9689 Presence of other specified functional implants: Secondary | ICD-10-CM

## 2015-06-04 DIAGNOSIS — T7840XA Allergy, unspecified, initial encounter: Secondary | ICD-10-CM | POA: Diagnosis not present

## 2015-06-04 HISTORY — DX: Infection and inflammatory reaction due to other nervous system device, implant or graft, initial encounter: T85.738A

## 2015-06-04 HISTORY — DX: Olecranon bursitis, left elbow: M70.22

## 2015-06-04 HISTORY — DX: Herpesviral gingivostomatitis and pharyngotonsillitis: B00.2

## 2015-06-04 HISTORY — DX: Vitreous degeneration, unspecified eye: H43.819

## 2015-06-04 LAB — CBC WITH DIFFERENTIAL/PLATELET
BASOS ABS: 0 10*3/uL (ref 0.0–0.1)
Basophils Relative: 0 % (ref 0–1)
EOS PCT: 1 % (ref 0–5)
Eosinophils Absolute: 0.1 10*3/uL (ref 0.0–0.7)
HEMATOCRIT: 36.2 % — AB (ref 39.0–52.0)
Hemoglobin: 11.3 g/dL — ABNORMAL LOW (ref 13.0–17.0)
Lymphocytes Relative: 10 % — ABNORMAL LOW (ref 12–46)
Lymphs Abs: 0.9 10*3/uL (ref 0.7–4.0)
MCH: 26.6 pg (ref 26.0–34.0)
MCHC: 31.2 g/dL (ref 30.0–36.0)
MCV: 85.2 fL (ref 78.0–100.0)
MONO ABS: 0.7 10*3/uL (ref 0.1–1.0)
MONOS PCT: 8 % (ref 3–12)
NEUTROS PCT: 81 % — AB (ref 43–77)
Neutro Abs: 7.6 10*3/uL (ref 1.7–7.7)
Platelets: 271 10*3/uL (ref 150–400)
RBC: 4.25 MIL/uL (ref 4.22–5.81)
RDW: 15.2 % (ref 11.5–15.5)
WBC: 9.3 10*3/uL (ref 4.0–10.5)

## 2015-06-04 LAB — URINALYSIS, ROUTINE W REFLEX MICROSCOPIC
BILIRUBIN URINE: NEGATIVE
GLUCOSE, UA: NEGATIVE mg/dL
Hgb urine dipstick: NEGATIVE
KETONES UR: NEGATIVE mg/dL
LEUKOCYTES UA: NEGATIVE
NITRITE: NEGATIVE
Protein, ur: NEGATIVE mg/dL
SPECIFIC GRAVITY, URINE: 1.023 (ref 1.005–1.030)
Urobilinogen, UA: 0.2 mg/dL (ref 0.0–1.0)
pH: 8 (ref 5.0–8.0)

## 2015-06-04 LAB — COMPREHENSIVE METABOLIC PANEL
ALT: 15 U/L — ABNORMAL LOW (ref 17–63)
AST: 60 U/L — ABNORMAL HIGH (ref 15–41)
Albumin: 3.8 g/dL (ref 3.5–5.0)
Alkaline Phosphatase: 90 U/L (ref 38–126)
Anion gap: 6 (ref 5–15)
BILIRUBIN TOTAL: 0.8 mg/dL (ref 0.3–1.2)
BUN: 21 mg/dL — ABNORMAL HIGH (ref 6–20)
CALCIUM: 9.2 mg/dL (ref 8.9–10.3)
CO2: 28 mmol/L (ref 22–32)
Chloride: 104 mmol/L (ref 101–111)
Creatinine, Ser: 0.82 mg/dL (ref 0.61–1.24)
GFR calc Af Amer: >60 mL/min (ref >60)
Glucose, Bld: 106 mg/dL — ABNORMAL HIGH (ref 65–99)
POTASSIUM: 4.2 mmol/L (ref 3.5–5.1)
SODIUM: 138 mmol/L (ref 135–145)
Total Protein: 6.7 g/dL (ref 6.5–8.1)

## 2015-06-04 LAB — CBC
HCT: 35 % — ABNORMAL LOW (ref 39.0–52.0)
Hemoglobin: 10.7 g/dL — ABNORMAL LOW (ref 13.0–17.0)
MCH: 26.2 pg (ref 26.0–34.0)
MCHC: 30.6 g/dL (ref 30.0–36.0)
MCV: 85.8 fL (ref 78.0–100.0)
PLATELETS: 264 10*3/uL (ref 150–400)
RBC: 4.08 MIL/uL — ABNORMAL LOW (ref 4.22–5.81)
RDW: 15.2 % (ref 11.5–15.5)
WBC: 9.3 10*3/uL (ref 4.0–10.5)

## 2015-06-04 LAB — AMMONIA: Ammonia: <9 umol/L — ABNORMAL LOW (ref 9–35)

## 2015-06-04 LAB — TYPE AND SCREEN
ABO/RH(D): O NEG
Antibody Screen: NEGATIVE

## 2015-06-04 LAB — PROTIME-INR
INR: 1.2 (ref 0.00–1.49)
Prothrombin Time: 15.3 seconds — ABNORMAL HIGH (ref 11.6–15.2)

## 2015-06-04 LAB — CREATININE, SERUM
CREATININE: 0.91 mg/dL (ref 0.61–1.24)
GFR calc non Af Amer: >60 mL/min (ref >60)

## 2015-06-04 LAB — ABO/RH: ABO/RH(D): O NEG

## 2015-06-04 LAB — APTT: aPTT: 31 seconds (ref 24–37)

## 2015-06-04 MED ORDER — ASPIRIN 81 MG PO CHEW
81.0000 mg | CHEWABLE_TABLET | Freq: Every evening | ORAL | Status: DC
  Administered 2015-06-04 – 2015-06-06 (×3): 81 mg via ORAL
  Filled 2015-06-04 (×4): qty 1

## 2015-06-04 MED ORDER — MAGNESIUM OXIDE 400 (241.3 MG) MG PO TABS
400.0000 mg | ORAL_TABLET | Freq: Every day | ORAL | Status: DC
  Administered 2015-06-05 – 2015-06-07 (×3): 400 mg via ORAL
  Filled 2015-06-04 (×4): qty 1

## 2015-06-04 MED ORDER — HYDROCODONE-ACETAMINOPHEN 5-325 MG PO TABS: 1.0000 | ORAL_TABLET | ORAL | Status: DC | PRN

## 2015-06-04 MED ORDER — ALUM & MAG HYDROXIDE-SIMETH 200-200-20 MG/5ML PO SUSP: 30.0000 mL | Freq: Four times a day (QID) | ORAL | Status: DC | PRN

## 2015-06-04 MED ORDER — VANCOMYCIN HCL 10 G IV SOLR
1500.0000 mg | Freq: Once | INTRAVENOUS | Status: AC
  Administered 2015-06-04: 1500 mg via INTRAVENOUS
  Filled 2015-06-04: qty 1500

## 2015-06-04 MED ORDER — ONDANSETRON HCL 4 MG PO TABS: 4.0000 mg | ORAL_TABLET | Freq: Four times a day (QID) | ORAL | Status: DC | PRN

## 2015-06-04 MED ORDER — ACETAMINOPHEN 650 MG RE SUPP: 650.0000 mg | Freq: Four times a day (QID) | RECTAL | Status: DC | PRN

## 2015-06-04 MED ORDER — QUETIAPINE FUMARATE 25 MG PO TABS
25.0000 mg | ORAL_TABLET | Freq: Every day | ORAL | Status: DC
  Administered 2015-06-04 – 2015-06-06 (×3): 25 mg via ORAL
  Filled 2015-06-04 (×4): qty 1

## 2015-06-04 MED ORDER — ZOLPIDEM TARTRATE 5 MG PO TABS: 5.0000 mg | ORAL_TABLET | Freq: Every evening | ORAL | Status: DC | PRN

## 2015-06-04 MED ORDER — CARBIDOPA-LEVODOPA ER 25-100 MG PO TBCR
1.0000 | EXTENDED_RELEASE_TABLET | Freq: Every day | ORAL | Status: DC
  Administered 2015-06-04 – 2015-06-06 (×3): 1 via ORAL
  Filled 2015-06-04 (×4): qty 1

## 2015-06-04 MED ORDER — ESCITALOPRAM OXALATE 10 MG PO TABS
10.0000 mg | ORAL_TABLET | Freq: Every day | ORAL | Status: DC
  Administered 2015-06-05 – 2015-06-06 (×2): 10 mg via ORAL
  Filled 2015-06-04 (×2): qty 1

## 2015-06-04 MED ORDER — POLYETHYLENE GLYCOL 3350 17 G PO PACK: 17.0000 g | PACK | Freq: Every day | ORAL | Status: DC | PRN

## 2015-06-04 MED ORDER — ZINC GLUCONATE 50 MG PO TABS
50.0000 mg | ORAL_TABLET | Freq: Every day | ORAL | Status: DC
  Filled 2015-06-04: qty 1

## 2015-06-04 MED ORDER — MIRABEGRON ER 50 MG PO TB24
50.0000 mg | ORAL_TABLET | Freq: Every day | ORAL | Status: DC
  Administered 2015-06-05 – 2015-06-07 (×3): 50 mg via ORAL
  Filled 2015-06-04 (×3): qty 1

## 2015-06-04 MED ORDER — TAMSULOSIN HCL 0.4 MG PO CAPS
0.4000 mg | ORAL_CAPSULE | Freq: Every day | ORAL | Status: DC
  Administered 2015-06-04 – 2015-06-07 (×4): 0.4 mg via ORAL
  Filled 2015-06-04 (×4): qty 1

## 2015-06-04 MED ORDER — VANCOMYCIN HCL IN DEXTROSE 1-5 GM/200ML-% IV SOLN
1000.0000 mg | Freq: Two times a day (BID) | INTRAVENOUS | Status: DC
  Filled 2015-06-04: qty 200

## 2015-06-04 MED ORDER — VITAMIN C 500 MG PO TABS
500.0000 mg | ORAL_TABLET | Freq: Two times a day (BID) | ORAL | Status: DC
  Administered 2015-06-04 – 2015-06-07 (×6): 500 mg via ORAL
  Filled 2015-06-04 (×8): qty 1

## 2015-06-04 MED ORDER — DAPSONE 25 MG PO TABS
50.0000 mg | ORAL_TABLET | Freq: Every day | ORAL | Status: DC
  Administered 2015-06-05 – 2015-06-07 (×3): 50 mg via ORAL
  Filled 2015-06-04 (×4): qty 2

## 2015-06-04 MED ORDER — EZETIMIBE 10 MG PO TABS
10.0000 mg | ORAL_TABLET | Freq: Every day | ORAL | Status: DC
  Administered 2015-06-05 – 2015-06-07 (×3): 10 mg via ORAL
  Filled 2015-06-04 (×3): qty 1

## 2015-06-04 MED ORDER — CIPROFLOXACIN IN D5W 400 MG/200ML IV SOLN
400.0000 mg | Freq: Two times a day (BID) | INTRAVENOUS | Status: DC
  Administered 2015-06-04 – 2015-06-05 (×2): 400 mg via INTRAVENOUS
  Filled 2015-06-04 (×2): qty 200

## 2015-06-04 MED ORDER — AZITHROMYCIN 500 MG IV SOLR
500.0000 mg | INTRAVENOUS | Status: DC
  Filled 2015-06-04: qty 500

## 2015-06-04 MED ORDER — VITAMIN B-12 1000 MCG PO TABS
1000.0000 ug | ORAL_TABLET | Freq: Every day | ORAL | Status: DC
  Administered 2015-06-04 – 2015-06-07 (×4): 1000 ug via ORAL
  Filled 2015-06-04 (×4): qty 1

## 2015-06-04 MED ORDER — DONEPEZIL HCL 10 MG PO TABS
10.0000 mg | ORAL_TABLET | Freq: Every day | ORAL | Status: DC
  Administered 2015-06-04 – 2015-06-06 (×3): 10 mg via ORAL
  Filled 2015-06-04 (×3): qty 1

## 2015-06-04 MED ORDER — ENOXAPARIN SODIUM 40 MG/0.4ML ~~LOC~~ SOLN
40.0000 mg | SUBCUTANEOUS | Status: DC
  Administered 2015-06-04 – 2015-06-06 (×3): 40 mg via SUBCUTANEOUS
  Filled 2015-06-04 (×4): qty 0.4

## 2015-06-04 MED ORDER — ACETAMINOPHEN 325 MG PO TABS: 650.0000 mg | ORAL_TABLET | Freq: Four times a day (QID) | ORAL | Status: DC | PRN

## 2015-06-04 MED ORDER — DEXTROSE 5 % IV SOLN
1.0000 g | INTRAVENOUS | Status: DC
  Administered 2015-06-04: 1 g via INTRAVENOUS
  Filled 2015-06-04: qty 10

## 2015-06-04 MED ORDER — CALCIUM-VITAMIN D 600-400 MG-UNIT PO TABS: 2.0000 | ORAL_TABLET | Freq: Every day | ORAL | Status: DC

## 2015-06-04 MED ORDER — DOCUSATE SODIUM 100 MG PO CAPS: 100.0000 mg | ORAL_CAPSULE | Freq: Two times a day (BID) | ORAL | Status: DC | PRN

## 2015-06-04 MED ORDER — SODIUM CHLORIDE 0.9 % IV SOLN
1000.0000 mL | INTRAVENOUS | Status: DC
  Administered 2015-06-04: 1000 mL via INTRAVENOUS

## 2015-06-04 MED ORDER — RESOURCE THICKENUP CLEAR PO POWD
ORAL | Status: DC | PRN
  Filled 2015-06-04: qty 125

## 2015-06-04 MED ORDER — ONDANSETRON HCL 4 MG/2ML IJ SOLN: 4.0000 mg | Freq: Four times a day (QID) | INTRAMUSCULAR | Status: DC | PRN

## 2015-06-04 MED ORDER — CALCIUM CARBONATE-VITAMIN D 500-200 MG-UNIT PO TABS
2.0000 | ORAL_TABLET | Freq: Every day | ORAL | Status: DC
  Administered 2015-06-05 – 2015-06-07 (×3): 2 via ORAL
  Filled 2015-06-04 (×4): qty 2

## 2015-06-04 MED ORDER — MORPHINE SULFATE 2 MG/ML IJ SOLN
1.0000 mg | INTRAMUSCULAR | Status: DC | PRN
Start: 2015-06-04 — End: 2015-06-07

## 2015-06-04 MED ORDER — DEXTROSE 5 % IV SOLN
2.0000 g | Freq: Three times a day (TID) | INTRAVENOUS | Status: DC
  Administered 2015-06-04 – 2015-06-05 (×2): 2 g via INTRAVENOUS
  Filled 2015-06-04 (×3): qty 2

## 2015-06-04 MED ORDER — CARBIDOPA-LEVODOPA 25-250 MG PO TABS
1.0000 | ORAL_TABLET | Freq: Four times a day (QID) | ORAL | Status: DC
  Administered 2015-06-04 – 2015-06-05 (×3): 1 via ORAL
  Filled 2015-06-04 (×6): qty 1

## 2015-06-04 MED ORDER — SODIUM CHLORIDE 0.9 % IV SOLN
1000.0000 mL | INTRAVENOUS | Status: AC
  Administered 2015-06-05 – 2015-06-06 (×2): 1000 mL via INTRAVENOUS

## 2015-06-04 MED ORDER — PANTOPRAZOLE SODIUM 40 MG PO TBEC
40.0000 mg | DELAYED_RELEASE_TABLET | Freq: Every day | ORAL | Status: DC
  Administered 2015-06-05 – 2015-06-07 (×3): 40 mg via ORAL
  Filled 2015-06-04 (×3): qty 1

## 2015-06-04 NOTE — Progress Notes (Addendum)
ANTIBIOTIC CONSULT NOTE - INITIAL  Pharmacy Consult for Vancomycin, Ceftazidime Indication: HCAP  Allergies  Allergen Reactions  . Crestor [Rosuvastatin Calcium] Other (See Comments)    Reaction:  Muscle weakness   . Lipitor [Atorvastatin] Other (See Comments)    Joint pain  . Rosuvastatin Dermatitis and Other (See Comments)    Reaction:  Muscle weakness   . Bactrim Rash  . Gluten Rash and Other (See Comments)    Pts wife states that he can eat some gluten.      Patient Measurements: Height:  (180.3 cm) Weight: 160 lb (72.576 kg) IBW/kg (Calculated) : 75.3  Vital Signs: Temp: 98.7 F (37.1 C) (07/25 1628) Temp Source: Oral (07/25 1628) BP: 83/68 mmHg (07/25 1628) Pulse Rate: 108 (07/25 1628) Intake/Output from previous day:   Intake/Output from this shift:    Labs:  Recent Labs  06/04/15 1240  WBC 9.3  HGB 11.3*  PLT 271  CREATININE 0.82   Estimated Creatinine Clearance: 78.7 mL/min (by C-G formula based on Cr of 0.82). No results for input(s): VANCOTROUGH, VANCOPEAK, VANCORANDOM, GENTTROUGH, GENTPEAK, GENTRANDOM, TOBRATROUGH, TOBRAPEAK, TOBRARND, AMIKACINPEAK, AMIKACINTROU, AMIKACIN in the last 72 hours.   Microbiology: No results found for this or any previous visit (from the past 720 hour(s)).  Medical History: Past Medical History  Diagnosis Date  . Parkinson disease   . Bradycardia     syncope due to prolonged pauses, resolved with PPM  . Sleep apnea   . Hypertension   . Dermatitis   . Depression   . Rib fractures 2011    S/p fall  . Maxillary fracture 2009    Right  . BPH (benign prostatic hypertrophy)   . Coronary artery disease 1986    Inferior wall MI treated with Angioplasty  . Normal echocardiogram 2007    Mild LVH with Impaired relaxation, Mild-Mod. Aortic Root dilation, Mild Aortic Sclerosis, Mild MR, Mild TR, Ef-50-55%  . Normal nuclear stress test 2006    Normal EF-54%  . CAD (coronary artery disease)   . Inferior MI   .  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 Maxillary fracture 2009    Right side  . Rib fracture 07/2010  . Atrioventricular block, complete   . Pacemaker   . H/O hiatal hernia      Assessment: 63 y/oM with PMH of a-fib, CAD, pacemaker, Parkinson's disease with deep brain stimulator, recent infection of DBS generator site with Staph lugdunensis and  Enterobacter cloacae treated at [facility] in June who now presents to [facility] ED with AMS, fever, rigors, and cough. CXR with new left lower lobe consolidation suspicious for pneumonia. Pharmacy consulted to dose Vancomycin and Ceftazidime for HCAP. Patient also placed on Ciprofloxacin per MD.  7/25 Ceftriaxone x 1 7/25 >> Cipro >> 7/25 >> Vancomycin >>  7/25 >> Ceftazidime >>   7/25 blood x 2: sent urine:  ordered sputum:  ordered  Tmax: 98.43F WBC WNL SCr 0.82 with CrCl ~ 71 ml/min CG  Goal of Therapy:  Vancomycin trough level 15-20 mcg/ml  Appropriate antibiotic dosing for renal function and indication Eradication of infection  Plan:   Ceftazidime 2g IV q8h  Vancomycin 1500 mg IV x 1, then 1000 mg IV q12h. Infuse doses slowly given documentation in Care Everywhere from [facility] that patient has history of Redman's syndrome with Vancomycin.  Plan for Vancomycin trough level at steady state.  Cipro dosing per MD is appropriate for renal function/indication.  Monitor QTc closely with  concurrent Cipro, Seroquel, and Lexapro administration.  Monitor renal function, cultures, clinical course.   Greer Pickerel, PharmD, BCPS Pager: [phone] 06/04/2015 5:40 PM

## 2015-06-04 NOTE — ED Notes (Signed)
Pt brought in by EMS, EMS states wife reported patient was acting "differently" and reported fever. Pt arrived with temp 98.9 oral. Pt alert, able to follow commands and answer questions. Hx of parkinson's disease.

## 2015-06-04 NOTE — H&P (Signed)
Patient Demographics  [name], is a 74 y.o. male  MRN: [id number]   DOB - [birth year]  Admit Date - 06/04/2015  Outpatient Primary MD for the patient is Neena Rhymes, MD   Assessment [name] is a pleasant 73 year old male with atrial fibrillation/CAD/pacemaker in situ/Parkinson's disease/status post brain stimulator with recent bacteremia(Staph Lugdunensis, Enterobacter cloacae) managed at [facility], in June this year, who now comes in with one-day history of altered mental status, fever and rigors associated with cough, likely resulting from healthcare associated pneumonia with possibility of aspiration pneumonia. Chest x-ray shows "New left lower lobe consolidation suspicious for pneumonia". His white count is 9,300. CT brain does not show any acute abnormalities. Obviously, the concern is potential infection after several implanted devices, but at this point there is no indication of infection of the devices. He may have aspirated. Patient will be admitted for septic workup, empiric antibiotics to cover for nosocomial organisms and IV fluids. I have discussed the plan of care with patient's wife who provides most of the history at the bedside as patient is somewhat confused. He has acute metabolic encephalopathy resulting from ongoing infection. Patient is DNR/DNI confirmed by his wife. Plan HCAP (healthcare-associated pneumonia)/Encephalopathy, metabolic  Admit MedSurg  Septic workup including blood culture/urine culture/sputum culture  Vancomycin/Ceftazidime/Ciprofloxacin with plans to de-escalate depending on culture results   Neckter thick liquid diet Atrial fibrillation/Cardiac pacemaker in situ/Hyperlipidemia/CAD (coronary artery disease)/Dementia/Idiopathic Parkinson's disease/Status post deep brain stimulator placement  No acute changes  Resume home medications DVT/GI Prophylaxis  Lovenox  PPI Family Communication: Admission, patients condition and  plan of care including tests being ordered have been discussed with the patient and wife who indicate understanding and agree with the plan and Code Status.  Code Status   DNR/DNI  Likely DC  Home- per wife's preference  Condition GUARDED    Time spent in minutes : 55  Chief Complaint Chief Complaint  Patient presents with  . Altered Mental Status     HPI [name]  is a 74 y.o. male who comes in with some confusion associated with chills, subjective fever and cough which started earlier today. Wife says that patient has been doing okay since discharge from work for [employer] at the end of June at which time he had infection of internal devices. She says he did well and was actually exercising in the gym yesterday. Today he workup with rigors, and he was confused. She denies any nausea vomiting or diarrhea. He completed a course of antibiotics more than 2 weeks ago. His temperature is 100F at home today.   Review of Systems   As in the HPI above.   Past Medical History  Diagnosis Date  . Parkinson disease   . Bradycardia     syncope due to prolonged pauses, resolved with PPM  . Sleep apnea   . Hypertension   . Dermatitis   . Depression   . Rib fractures 2011    S/p fall  . Maxillary fracture 2009    Right  . BPH (benign prostatic hypertrophy)   . Coronary artery disease 1986    Inferior wall MI treated with Angioplasty  . Normal echocardiogram 2007    Mild LVH with Impaired relaxation, Mild-Mod. Aortic Root dilation, Mild Aortic Sclerosis, Mild MR, Mild TR, Ef-50-55%  . Normal nuclear stress test 2006    Normal EF-54%  . CAD (coronary artery disease)   . Inferior MI   . Dermatitis herpetiformis  on Dapsone  . Maxillary fracture 2009    Right side  . Rib fracture 07/2010  . Atrioventricular block, complete   . Pacemaker   . H/O hiatal hernia       Past Surgical History  Procedure Laterality Date  . Pacemaker insertion  10/28/10    MDT  implanted by Dr Ladona Ridgel  . Deep brain stimulator placement      Parkinsons  . Arthroscopic knee      Left knee  . Cataract surgery      Bilateral  . Inguinal hernia repair      Bilateral  . Cardiac catheterization      Multiple Angioplasties, last 2002  . Removal of ganglion cyst      right wrist  . Insertion / placement / revision neurostimulator    . Eye surgery    . Insert / replace / remove pacemaker      Social History History  Substance Use Topics  . Smoking status: Never Smoker   . Smokeless tobacco: Not on file  . Alcohol Use: No    Family History Family History  Problem Relation Age of Onset  . Heart attack Mother   . Heart disease Mother   . Hypertension Mother   . Heart attack Father   . Other Father     renal calculi  . Heart disease Brother   . Heart attack Sister     Prior to Admission medications   Medication Sig Start Date End Date Taking? Authorizing Provider  aspirin 81 MG tablet Take 81 mg by mouth daily.   Yes Historical Provider, MD  Calcium Carb-Cholecalciferol (CALCIUM-VITAMIN D) 600-400 MG-UNIT TABS Take 2 tablets by mouth daily.   Yes Historical Provider, MD  carbidopa-levodopa (SINEMET IR) 25-250 MG per tablet Take 1 tablet by mouth 4 (four) times daily.   Yes Historical Provider, MD  Carbidopa-Levodopa ER (SINEMET CR) 25-100 MG tablet controlled release Take 1 tablet by mouth at bedtime.   Yes Historical Provider, MD  dapsone 25 MG tablet Take 50 mg by mouth every morning.    Yes Historical Provider, MD  docusate sodium (COLACE) 100 MG capsule Take 1 capsule (100 mg total) by mouth 2 (two) times daily as needed for mild constipation. 01/17/15  Yes Ripudeep K Rai, MD  donepezil (ARICEPT) 10 MG tablet Take 10 mg by mouth daily.     Yes Historical Provider, MD  escitalopram (LEXAPRO) 10 MG tablet Take 1 tablet (10 mg total) by mouth daily. 05/24/14  Yes Daniel J Angiulli, PA-C  Magnesium 400 MG CAPS Take 400 mg by mouth daily. 05/24/14  Yes Daniel J  Angiulli, PA-C  mirabegron ER (MYRBETRIQ) 50 MG TB24 tablet Take 1 tablet (50 mg total) by mouth daily. 05/24/14  Yes Daniel J Angiulli, PA-C  nitroGLYCERIN (NITROSTAT) 0.4 MG SL tablet Place 0.4 mg under the tongue every 5 (five) minutes as needed for chest pain.   Yes Historical Provider, MD  QUEtiapine (SEROQUEL) 25 MG tablet Take 25 mg by mouth at bedtime.   Yes Historical Provider, MD  tamsulosin (FLOMAX) 0.4 MG CAPS capsule Take 1 capsule (0.4 mg total) by mouth daily. 05/24/14  Yes Daniel J Angiulli, PA-C  vitamin B-12 (CYANOCOBALAMIN) 1000 MCG tablet Take 1 tablet (1,000 mcg total) by mouth daily. 05/24/14  Yes Daniel J Angiulli, PA-C  vitamin C (ASCORBIC ACID) 500 MG tablet Take 500 mg by mouth 2 (two) times daily.   Yes Historical Provider, MD  ZETIA 10 MG tablet  TAKE 1 TABLET BY MOUTH EVERY DAY Patient taking differently: TAKE 10 MG BY MOUTH DAILY. 04/12/15  Yes Marinus Maw, MD  zinc gluconate 50 MG tablet Take 50 mg by mouth daily.   Yes Historical Provider, MD  guaiFENesin (MUCINEX) 600 MG 12 hr tablet Take 1 tablet (600 mg total) by mouth 2 (two) times daily. Patient not taking: Reported on 06/04/2015 01/17/15   Ripudeep Jenna Luo, MD    Allergies  Allergen Reactions  . Crestor [Rosuvastatin Calcium] Other (See Comments)    Reaction:  Muscle weakness   . Lipitor [Atorvastatin] Other (See Comments)    Joint pain  . Rosuvastatin Dermatitis and Other (See Comments)    Reaction:  Muscle weakness   . Bactrim Rash  . Gluten Rash and Other (See Comments)    Pts wife states that he can eat some gluten.      Physical Exam  Vitals  Blood pressure 83/68, pulse 108, temperature 98.7 F (37.1 C), temperature source Oral, resp. rate 20, height  (1.803 m), weight 72.576 kg (160 lb), SpO2 93 %.   1. General: lying in bed has tremors.  2. Normal affect and insight, Not Suicidal or Homicidal, Awake Alert, Oriented X 3.  3. No F.N deficits, ALL C.Nerves Intact, Strength 5/5 all 4  extremities, Sensation intact all 4 extremities, Plantars down going.  4. Ears and Eyes appear Normal, Conjunctivae clear, PERRLA. Moist Oral Mucosa.  5. Supple Neck, No JVD, No cervical lymphadenopathy appriciated, No Carotid Bruits.  6. Symmetrical Chest wall movement, Good air movement bilaterally, with coughing. Bibasilar rhonchi.  7. RRR, No Gallops, Rubs or Murmurs, No Parasternal Heave.  8. Positive Bowel Sounds, Abdomen Soft, Non tender, No organomegaly appriciated,No rebound -guarding or rigidity. Stimulator right side abdomen, no evidence of infection.  9.  No Cyanosis, Normal Skin Turgor, No Skin Rash or Bruise.  10. Good muscle tone,  joints appear normal , no effusions, Normal ROM.  11. No Palpable Lymph Nodes in Neck or Axillae  Data Review CBC  Recent Labs Lab 06/04/15 1240 06/04/15 1720  WBC 9.3 9.3  HGB 11.3* 10.7*  HCT 36.2* 35.0*  PLT 271 264  MCV 85.2 85.8  MCH 26.6 26.2  MCHC 31.2 30.6  RDW 15.2 15.2  LYMPHSABS 0.9  --   MONOABS 0.7  --   EOSABS 0.1  --   BASOSABS 0.0  --    ------------------------------------------------------------------------------------------------------------------  Chemistries   Recent Labs Lab 06/04/15 1240  NA 138  K 4.2  CL 104  CO2 28  GLUCOSE 106*  BUN 21*  CREATININE 0.82  CALCIUM 9.2  AST 60*  ALT 15*  ALKPHOS 90  BILITOT 0.8   ------------------------------------------------------------------------------------------------------------------ estimated creatinine clearance is 78.7 mL/min (by C-G formula based on Cr of 0.82). ------------------------------------------------------------------------------------------------------------------ No results for input(s): TSH, T4TOTAL, T3FREE, THYROIDAB in the last 72 hours.  Invalid input(s): FREET3   Coagulation profile  Recent Labs Lab 06/04/15 1240  INR 1.20    ------------------------------------------------------------------------------------------------------------------- No results for input(s): DDIMER in the last 72 hours. -------------------------------------------------------------------------------------------------------------------  Cardiac Enzymes No results for input(s): CKMB, TROPONINI, MYOGLOBIN in the last 168 hours.  Invalid input(s): CK ------------------------------------------------------------------------------------------------------------------ Invalid input(s): POCBNP   ---------------------------------------------------------------------------------------------------------------  Urinalysis    Component Value Date/Time   COLORURINE YELLOW 06/04/2015 1316   APPEARANCEUR CLEAR 06/04/2015 1316   LABSPEC 1.023 06/04/2015 1316   PHURINE 8.0 06/04/2015 1316   GLUCOSEU NEGATIVE 06/04/2015 1316   HGBUR NEGATIVE 06/04/2015 1316   BILIRUBINUR NEGATIVE 06/04/2015  1316   KETONESUR NEGATIVE 06/04/2015 1316   PROTEINUR NEGATIVE 06/04/2015 1316   UROBILINOGEN 0.2 06/04/2015 1316   NITRITE NEGATIVE 06/04/2015 1316   LEUKOCYTESUR NEGATIVE 06/04/2015 1316    ----------------------------------------------------------------------------------------------------------------  Imaging results  Dg Chest 2 View  06/04/2015   CLINICAL DATA:  Confusion  EXAM: CHEST  2 VIEW  COMPARISON:  03/07/2015  FINDINGS: New left lower lobe consolidation is identified. Lungs are hypoaerated with crowding of the bronchovascular markings. Bones are subjectively osteopenic. Heart size upper limits of normal. Right lung is grossly clear. Right-sided dual lead pacer in place. Leads/catheter projects over the right hemithorax.  IMPRESSION: New left lower lobe consolidation suspicious for pneumonia.   Electronically Signed   By: Christiana Pellant M.D.   On: 06/04/2015 13:52   Ct Head Wo Contrast  06/04/2015   CLINICAL DATA:  Altered mental status for 1 day.  Unable to communicate today. History of Parkinson's for 30 years. Neurostimulator. No previous stroke.  EXAM: CT HEAD WITHOUT CONTRAST  TECHNIQUE: Contiguous axial images were obtained from the base of the skull through the vertex without intravenous contrast.  COMPARISON:  01/15/2015  FINDINGS: Dual leads of deep brain stimulator appears stable. There is significant streak artifact. There is no intra or extra-axial fluid collection or mass lesion. The basilar cisterns and ventricles have a normal appearance. There is no CT evidence for acute infarction or hemorrhage. There is moderate central and cortical atrophy. Periventricular white matter changes are consistent with small vessel disease. Right frontal encephalomalacia appears stable.  Rounded soft tissue density within the left sphenoid air cell appears stable in may indicate chronic sinus disease versus mucous retention cyst, or polyp. No acute fractures.  IMPRESSION: 1. Stable appearance of deep brain stimulator. 2. Atrophy and small vessel disease. 3.  No evidence for acute intracranial abnormality.   Electronically Signed   By: Norva Pavlov M.D.   On: 06/04/2015 14:01        Lily Velasquez M.D on 06/04/2015 at 6:10 PM  Between 7am to 7pm - Pager - [phone]  After 7pm go to [email or web address] - password [email or web address]  And look for the night coverage person covering me after hours  [facility] Office  [phone]

## 2015-06-04 NOTE — Telephone Encounter (Signed)
Patient presented to [facility] ED.  

## 2015-06-04 NOTE — ED Notes (Signed)
Bed: WA08 Expected date:  Expected time:  Means of arrival:  Comments: EMS-fever 

## 2015-06-04 NOTE — Telephone Encounter (Signed)
Patient Name: [name] DOB: Jan 17, 1941 Initial Comment Caller states husband has a fever of 100 under arm and chills, recently had surgery and had staff infection, coughing Nurse Assessment Nurse: Yetta Barre, RN, Miranda Date/Time (Eastern Time): 06/04/2015 10:03:28 AM Confirm and document reason for call. If symptomatic, describe symptoms. ---Caller states her husband woke up with chills and a fever, Temp 100.0 axillary. This morning he also has a cough and agitation. Has the patient traveled out of the country within the last 30 days? ---Not Applicable Does the patient require triage? ---Yes Related visit to physician within the last 2 weeks? ---Yes Does the PT have any chronic conditions? (i.e. diabetes, asthma, etc.) ---Yes List chronic conditions. ---Pacemaker, Parkinson's disease Guidelines Guideline Title Affirmed Question Affirmed Notes Fever [1] Fever > 101 F (38.3 C) AND [2] age > 43 Final Disposition User See Physician within 4 Hours (or PCP triage) Yetta Barre, RN, Miranda Comments Caller refused available appt with NP. She is going to call the surgeon's office and see what they recommend or will take him to the ED. Referrals GO TO FACILITY UNDECIDED Disagree/Comply: Comply

## 2015-06-04 NOTE — ED Provider Notes (Signed)
CSN: [id number]     Arrival date & time 06/04/15  1211 History   First MD Initiated Contact with Patient 06/04/15 1218     Chief Complaint  Patient presents with  . Altered Mental Status    Patient is a 74 y.o. male presenting with altered mental status.  Altered Mental Status  Patient presents to the emergency room for a change in his behavior. Patient himself is unable to tell me why he is here in the emergency room. With some difficulty he is able to tell me to speak to his wife. The patient has history of severe Parkinson's disease with limitations in his ability to communicate. Patient's wife noticed that this morning the patient was acting differently. He also was very tremulous this morning. She was concerned he was developing an infection so she checked his temperature. It was 100 axillary. He has had a bit of a cough. No vomiting or diarrhea. Recent falls or injury. Patient's wife called his neurosurgeon because a few months ago the patient had infections associated with his brain stimulator device.  His doctor said that he did not feel that this was related to that, because the patient's wounds have been well-healed without any issues. Past Medical History  Diagnosis Date  . Parkinson disease   . Bradycardia     syncope due to prolonged pauses, resolved with PPM  . Sleep apnea   . Hypertension   . Dermatitis   . Depression   . Rib fractures 2011    S/p fall  . Maxillary fracture 2009    Right  . BPH (benign prostatic hypertrophy)   . Coronary artery disease 1986    Inferior wall MI treated with Angioplasty  . Normal echocardiogram 2007    Mild LVH with Impaired relaxation, Mild-Mod. Aortic Root dilation, Mild Aortic Sclerosis, Mild MR, Mild TR, Ef-50-55%  . Normal nuclear stress test 2006    Normal EF-54%  . CAD (coronary artery disease)   . Inferior MI   . 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 Maxillary fracture 2009    Right side  . Rib fracture 07/2010  .  Atrioventricular block, complete   . Pacemaker   . H/O hiatal hernia    Past Surgical History  Procedure Laterality Date  . Pacemaker insertion  10/28/10    MDT implanted by Dr Ladona Ridgel  . Deep brain stimulator placement      Parkinsons  . Arthroscopic knee      Left knee  . Cataract surgery      Bilateral  . Inguinal hernia repair      Bilateral  . Cardiac catheterization      Multiple Angioplasties, last 2002  . Removal of ganglion cyst      right wrist  . Insertion / placement / revision neurostimulator    . Eye surgery    . Insert / replace / remove pacemaker     Family History  Problem Relation Age of Onset  . Heart attack Mother   . Heart disease Mother   . Hypertension Mother   . Heart attack Father   . Other Father     renal calculi  . Heart disease Brother   . Heart attack Sister    History  Substance Use Topics  . Smoking status: Never Smoker   . Smokeless tobacco: Not on file  . Alcohol Use: No    Review of Systems  All other systems reviewed and are negative.  Allergies  Crestor; Lipitor; Rosuvastatin; Bactrim; and Gluten  Home Medications   Prior to Admission medications   Medication Sig Start Date End Date Taking? Authorizing Provider  aspirin 81 MG tablet Take 81 mg by mouth daily.   Yes Historical Provider, MD  Calcium Carb-Cholecalciferol (CALCIUM-VITAMIN D) 600-400 MG-UNIT TABS Take 2 tablets by mouth daily.   Yes Historical Provider, MD  carbidopa-levodopa (SINEMET IR) 25-250 MG per tablet Take 1 tablet by mouth 4 (four) times daily.   Yes Historical Provider, MD  Carbidopa-Levodopa ER (SINEMET CR) 25-100 MG tablet controlled release Take 1 tablet by mouth at bedtime.   Yes Historical Provider, MD  dapsone 25 MG tablet Take 50 mg by mouth every morning.    Yes Historical Provider, MD  docusate sodium (COLACE) 100 MG capsule Take 1 capsule (100 mg total) by mouth 2 (two) times daily as needed for mild constipation. 01/17/15  Yes Ripudeep K  Rai, MD  donepezil (ARICEPT) 10 MG tablet Take 10 mg by mouth daily.     Yes Historical Provider, MD  escitalopram (LEXAPRO) 10 MG tablet Take 1 tablet (10 mg total) by mouth daily. 05/24/14  Yes Daniel J Angiulli, PA-C  Magnesium 400 MG CAPS Take 400 mg by mouth daily. 05/24/14  Yes Daniel J Angiulli, PA-C  mirabegron ER (MYRBETRIQ) 50 MG TB24 tablet Take 1 tablet (50 mg total) by mouth daily. 05/24/14  Yes Daniel J Angiulli, PA-C  nitroGLYCERIN (NITROSTAT) 0.4 MG SL tablet Place 0.4 mg under the tongue every 5 (five) minutes as needed for chest pain.   Yes Historical Provider, MD  QUEtiapine (SEROQUEL) 25 MG tablet Take 25 mg by mouth at bedtime.   Yes Historical Provider, MD  tamsulosin (FLOMAX) 0.4 MG CAPS capsule Take 1 capsule (0.4 mg total) by mouth daily. 05/24/14  Yes Daniel J Angiulli, PA-C  vitamin B-12 (CYANOCOBALAMIN) 1000 MCG tablet Take 1 tablet (1,000 mcg total) by mouth daily. 05/24/14  Yes Daniel J Angiulli, PA-C  vitamin C (ASCORBIC ACID) 500 MG tablet Take 500 mg by mouth 2 (two) times daily.   Yes Historical Provider, MD  ZETIA 10 MG tablet TAKE 1 TABLET BY MOUTH EVERY DAY Patient taking differently: TAKE 10 MG BY MOUTH DAILY. 04/12/15  Yes Marinus Maw, MD  zinc gluconate 50 MG tablet Take 50 mg by mouth daily.   Yes Historical Provider, MD  guaiFENesin (MUCINEX) 600 MG 12 hr tablet Take 1 tablet (600 mg total) by mouth 2 (two) times daily. Patient not taking: Reported on 06/04/2015 01/17/15   Ripudeep K Rai, MD   BP 117/53 mmHg  Pulse 85  Temp(Src) 98.9 F (37.2 C) (Oral)  Resp 22  SpO2 92% Physical Exam  Constitutional: No distress.  HENT:  Head: Normocephalic and atraumatic.  Right Ear: External ear normal.  Left Ear: External ear normal.  Eyes: Conjunctivae are normal. Right eye exhibits no discharge. Left eye exhibits no discharge. No scleral icterus.  Neck: Neck supple. No tracheal deviation present.  Cardiovascular: Normal rate, regular rhythm and intact distal  pulses.   Pulmonary/Chest: Effort normal and breath sounds normal. No stridor. No respiratory distress. He has no wheezes. He has no rales.  Abdominal: Soft. Bowel sounds are normal. He exhibits no distension. There is no tenderness. There is no rebound and no guarding.  Musculoskeletal: He exhibits no edema or tenderness.  Neurological: He is alert. He displays tremor. No cranial nerve deficit (no facial droop, extraocular movements intact, no slurred speech) or sensory deficit.  He exhibits normal muscle tone. He displays no seizure activity. Coordination abnormal.  Patient follows commands, good grip strength bilaterally, able to lift both legs off the bed without difficulty  Skin: Skin is warm and dry. No rash noted. He is not diaphoretic.  Psychiatric: He has a normal mood and affect.  Nursing note and vitals reviewed.   ED Course  Procedures (including critical care time) Labs Review Labs Reviewed  COMPREHENSIVE METABOLIC PANEL - Abnormal; Notable for the following:    Glucose, Bld 106 (*)    BUN 21 (*)    AST 60 (*)    ALT 15 (*)    All other components within normal limits  CBC WITH DIFFERENTIAL/PLATELET - Abnormal; Notable for the following:    Hemoglobin 11.3 (*)    HCT 36.2 (*)    Neutrophils Relative % 81 (*)    Lymphocytes Relative 10 (*)    All other components within normal limits  PROTIME-INR - Abnormal; Notable for the following:    Prothrombin Time 15.3 (*)    All other components within normal limits  AMMONIA - Abnormal; Notable for the following:    Ammonia <9 (*)    All other components within normal limits  APTT  URINALYSIS, ROUTINE W REFLEX MICROSCOPIC (NOT AT [facility])  TYPE AND SCREEN  ABO/RH    Imaging Review Dg Chest 2 View  06/04/2015   CLINICAL DATA:  Confusion  EXAM: CHEST  2 VIEW  COMPARISON:  03/07/2015  FINDINGS: New left lower lobe consolidation is identified. Lungs are hypoaerated with crowding of the bronchovascular markings. Bones are  subjectively osteopenic. Heart size upper limits of normal. Right lung is grossly clear. Right-sided dual lead pacer in place. Leads/catheter projects over the right hemithorax.  IMPRESSION: New left lower lobe consolidation suspicious for pneumonia.   Electronically Signed   By: Christiana Pellant M.D.   On: 06/04/2015 13:52   Ct Head Wo Contrast  06/04/2015   CLINICAL DATA:  Altered mental status for 1 day. Unable to communicate today. History of Parkinson's for 30 years. Neurostimulator. No previous stroke.  EXAM: CT HEAD WITHOUT CONTRAST  TECHNIQUE: Contiguous axial images were obtained from the base of the skull through the vertex without intravenous contrast.  COMPARISON:  01/15/2015  FINDINGS: Dual leads of deep brain stimulator appears stable. There is significant streak artifact. There is no intra or extra-axial fluid collection or mass lesion. The basilar cisterns and ventricles have a normal appearance. There is no CT evidence for acute infarction or hemorrhage. There is moderate central and cortical atrophy. Periventricular white matter changes are consistent with small vessel disease. Right frontal encephalomalacia appears stable.  Rounded soft tissue density within the left sphenoid air cell appears stable in may indicate chronic sinus disease versus mucous retention cyst, or polyp. No acute fractures.  IMPRESSION: 1. Stable appearance of deep brain stimulator. 2. Atrophy and small vessel disease. 3.  No evidence for acute intracranial abnormality.   Electronically Signed   By: Norva Pavlov M.D.   On: 06/04/2015 14:01    Medications  0.9 %  sodium chloride infusion (1,000 mLs Intravenous New Bag/Given 06/04/15 1320)  cefTRIAXone (ROCEPHIN) 1 g in dextrose 5 % 50 mL IVPB (not administered)  azithromycin (ZITHROMAX) 500 mg in dextrose 5 % 250 mL IVPB (not administered)     MDM   Final diagnoses:  CAP (community acquired pneumonia)  Confusion  Parkinson disease    Patient presented to  the emergency room with increasing confusion.  Wife mentioned he has had a cough recently and she felt that he had a fever at home. Laboratory and x-rays are unremarkable with the exception of a chest x-ray suggesting new left lower lobe consolidation. I discussed the findings with the patient and his wife. She does not think she can manage him at home temporarily with this change in his behavior. Plan on admission to the hospital for IV anabiotic and  further treatment.    Linwood Dibbles, MD 06/04/15 [phone]

## 2015-06-04 NOTE — Telephone Encounter (Signed)
Consuella Lose called stating pt has chills and fever of 100 under arm. Per Vicente Males transferred to [facility] to triage.

## 2015-06-04 NOTE — ED Notes (Signed)
Urine specimen obtained. Pt able to use urinal.  Wife remains at bedside with patient.

## 2015-06-05 DIAGNOSIS — T7840XA Allergy, unspecified, initial encounter: Secondary | ICD-10-CM | POA: Diagnosis not present

## 2015-06-05 LAB — COMPREHENSIVE METABOLIC PANEL
ALK PHOS: 74 U/L (ref 38–126)
ALT: 8 U/L — AB (ref 17–63)
ANION GAP: 6 (ref 5–15)
AST: 36 U/L (ref 15–41)
Albumin: 3.3 g/dL — ABNORMAL LOW (ref 3.5–5.0)
BUN: 20 mg/dL (ref 6–20)
CALCIUM: 8.6 mg/dL — AB (ref 8.9–10.3)
CHLORIDE: 105 mmol/L (ref 101–111)
CO2: 27 mmol/L (ref 22–32)
CREATININE: 0.91 mg/dL (ref 0.61–1.24)
GFR calc Af Amer: >60 mL/min (ref >60)
GFR calc non Af Amer: >60 mL/min (ref >60)
GLUCOSE: 100 mg/dL — AB (ref 65–99)
POTASSIUM: 4.1 mmol/L (ref 3.5–5.1)
Sodium: 138 mmol/L (ref 135–145)
Total Bilirubin: 0.9 mg/dL (ref 0.3–1.2)
Total Protein: 6 g/dL — ABNORMAL LOW (ref 6.5–8.1)

## 2015-06-05 LAB — CBC WITH DIFFERENTIAL/PLATELET
BASOS PCT: 1 % (ref 0–1)
Basophils Absolute: 0 10*3/uL (ref 0.0–0.1)
EOS ABS: 0.2 10*3/uL (ref 0.0–0.7)
Eosinophils Relative: 4 % (ref 0–5)
HEMATOCRIT: 34 % — AB (ref 39.0–52.0)
HEMOGLOBIN: 10.8 g/dL — AB (ref 13.0–17.0)
LYMPHS PCT: 25 % (ref 12–46)
Lymphs Abs: 1.3 10*3/uL (ref 0.7–4.0)
MCH: 27.6 pg (ref 26.0–34.0)
MCHC: 31.8 g/dL (ref 30.0–36.0)
MCV: 86.7 fL (ref 78.0–100.0)
MONO ABS: 0.6 10*3/uL (ref 0.1–1.0)
Monocytes Relative: 11 % (ref 3–12)
NEUTROS ABS: 3.2 10*3/uL (ref 1.7–7.7)
NEUTROS PCT: 59 % (ref 43–77)
PLATELETS: 240 10*3/uL (ref 150–400)
RBC: 3.92 MIL/uL — AB (ref 4.22–5.81)
RDW: 15.4 % (ref 11.5–15.5)
WBC: 5.4 10*3/uL (ref 4.0–10.5)

## 2015-06-05 LAB — PROTIME-INR
INR: 1.32 (ref 0.00–1.49)
PROTHROMBIN TIME: 16.5 s — AB (ref 11.6–15.2)

## 2015-06-05 LAB — MAGNESIUM: Magnesium: 1.8 mg/dL (ref 1.7–2.4)

## 2015-06-05 LAB — PHOSPHORUS: PHOSPHORUS: 3.8 mg/dL (ref 2.5–4.6)

## 2015-06-05 LAB — APTT: aPTT: 38 seconds — ABNORMAL HIGH (ref 24–37)

## 2015-06-05 MED ORDER — CARBIDOPA-LEVODOPA 25-250 MG PO TABS: 1.0000 | ORAL_TABLET | Freq: Four times a day (QID) | ORAL | Status: DC

## 2015-06-05 MED ORDER — DEXAMETHASONE SODIUM PHOSPHATE 4 MG/ML IJ SOLN
4.0000 mg | Freq: Once | INTRAMUSCULAR | Status: AC
  Administered 2015-06-05: 4 mg via INTRAVENOUS
  Filled 2015-06-05: qty 1

## 2015-06-05 MED ORDER — LEVOFLOXACIN IN D5W 750 MG/150ML IV SOLN
750.0000 mg | INTRAVENOUS | Status: DC
  Administered 2015-06-05: 750 mg via INTRAVENOUS
  Filled 2015-06-05 (×2): qty 150

## 2015-06-05 MED ORDER — ZINC SULFATE 220 (50 ZN) MG PO CAPS
220.0000 mg | ORAL_CAPSULE | Freq: Every day | ORAL | Status: DC
  Administered 2015-06-05 – 2015-06-07 (×3): 220 mg via ORAL
  Filled 2015-06-05 (×3): qty 1

## 2015-06-05 MED ORDER — SODIUM CHLORIDE 0.9 % IV BOLUS (SEPSIS)
500.0000 mL | Freq: Once | INTRAVENOUS | Status: AC
  Administered 2015-06-05: 500 mL via INTRAVENOUS

## 2015-06-05 MED ORDER — DIPHENHYDRAMINE HCL 50 MG/ML IJ SOLN
25.0000 mg | Freq: Once | INTRAMUSCULAR | Status: AC
  Administered 2015-06-05: 25 mg via INTRAVENOUS

## 2015-06-05 MED ORDER — CARBIDOPA-LEVODOPA 25-250 MG PO TABS
1.0000 | ORAL_TABLET | Freq: Four times a day (QID) | ORAL | Status: DC
  Administered 2015-06-05 – 2015-06-07 (×8): 1 via ORAL
  Filled 2015-06-05 (×11): qty 1

## 2015-06-05 MED ORDER — DIPHENHYDRAMINE HCL 50 MG/ML IJ SOLN
INTRAMUSCULAR | Status: AC
  Filled 2015-06-05: qty 1

## 2015-06-05 NOTE — Progress Notes (Addendum)
[facility] PROGRESS NOTE  [name] ZOX:[id number] DOB: 30-May-1941 DOA: 06/04/2015 PCP: Neena Rhymes, MD  Assessment&Care Plan at the time of admission on 06/04/15 [name] is a pleasant 74 year old male with atrial fibrillation/CAD/pacemaker in situ/Parkinson's disease/status post brain stimulator with recent bacteremia(Staph Lugdunensis, Enterobacter cloacae) managed at [facility], in June this year, who now comes in with one-day history of altered mental status, fever and rigors associated with cough, likely resulting from healthcare associated pneumonia with possibility of aspiration pneumonia. Chest x-ray shows "New left lower lobe consolidation suspicious for pneumonia". His white count is 9,300. CT brain does not show any acute abnormalities. Obviously, the concern is potential infection after several implanted devices, but at this point there is no indication of infection of the devices. He may have aspirated. Patient will be admitted for septic workup, empiric antibiotics to cover for nosocomial organisms and IV fluids. I have discussed the plan of care with patient's wife who provides most of the history at the bedside as patient is somewhat confused. He has acute metabolic encephalopathy resulting from ongoing infection. Patient is DNR/DNI confirmed by his wife. Subjective/overnight developments 06/05/2015: Septic workup remains negative. Noted night events-patient developed an allergic reaction felt to be related to ceftazidime given earlier this morning resulting in rash and lip swelling. Ceftazidime has therefore been added to the allergy list. Patient's blood pressure borderline low this afternoon but his mentation has improved-there may still be element of allergic reaction. Dexamethasone/NS bolus given this evening. Patient now on vancomycin/Levaquin pending final culture results. Wife mentions that patient had some arrhythmia while at [facility] related to some of the  medications, which were discontinued(Aricept and Seroquel were discontinued for concerns with potential QT prolongation). Will place him on telemetry for now and review records from [facility]. Plan is eventual discharge home with home health services per wife's preference. Patient has no complaints this evening. Plan HCAP (healthcare-associated pneumonia)/Encephalopathy, metabolic/allergic reaction to ceftazidime  Follow Septic workup including blood culture/urine culture/sputum culture  Day 2 Vancomycin/Ciprofloxacin-plan to de-escalate depending on culture results   Give dose of dexamethasone/NS bolus and reassess  Telemetry monitoring  Neckter thick liquid diet Atrial fibrillation/Cardiac pacemaker in situ/Hyperlipidemia/CAD (coronary artery disease)/Dementia/Idiopathic Parkinson's disease/Status post deep brain stimulator placement  No acute changes  Continue home medications DVT/GI Prophylaxis  Lovenox  PPI Family Communication: Discussed with the patient's wife over the phone [phone].  Code Status   DNR/DNI  Likely DC  Home- per wife's preference  Consultants:  None  Procedures:  None  Antibiotics:  Vancomycin 06/04/2015>  Ceftazidime 06/04/2015> 06/05/2015   Levaquin 06/04/2015>  HPI/Subjective: Feels okay. No complaints.  Objective: Filed Vitals:   06/05/15 1351  BP: 84/45  Pulse: 60  Temp: 98 F (36.7 C)  Resp: 18    Intake/Output Summary (Last 24 hours) at 06/05/15 1952 Last data filed at 06/05/15 1800  Gross per 24 hour  Intake 2543.75 ml  Output   1425 ml  Net 1118.75 ml   Filed Weights   06/04/15 1628  Weight: 72.576 kg (160 lb)    Exam:   General:  Comfortable at rest. Less tremulous.  Cardiovascular: S1-S2 normal. No murmurs. Pulse regular.  Respiratory: Good air entry bilaterally. No rhonchi or rales. Coughing.  Abdomen: Soft and nontender. Normal bowel sounds. No organomegaly.  Musculoskeletal: No pedal  edema   Neurological: Intact  Data Reviewed: Basic Metabolic Panel:  Recent Labs Lab 06/04/15 1240 06/04/15 1720 06/05/15 0433  NA 138  --  138  K 4.2  --  4.1  CL 104  --  105  CO2 28  --  27  GLUCOSE 106*  --  100*  BUN 21*  --  20  CREATININE 0.82 0.91 0.91  CALCIUM 9.2  --  8.6*  MG  --   --  1.8  PHOS  --   --  3.8   Liver Function Tests:  Recent Labs Lab 06/04/15 1240 06/05/15 0433  AST 60* 36  ALT 15* 8*  ALKPHOS 90 74  BILITOT 0.8 0.9  PROT 6.7 6.0*  ALBUMIN 3.8 3.3*   No results for input(s): LIPASE, AMYLASE in the last 168 hours.  Recent Labs Lab 06/04/15 1240  AMMONIA <9*   CBC:  Recent Labs Lab 06/04/15 1240 06/04/15 1720 06/05/15 0433  WBC 9.3 9.3 5.4  NEUTROABS 7.6  --  3.2  HGB 11.3* 10.7* 10.8*  HCT 36.2* 35.0* 34.0*  MCV 85.2 85.8 86.7  PLT 271 264 240   Cardiac Enzymes: No results for input(s): CKTOTAL, CKMB, CKMBINDEX, TROPONINI in the last 168 hours. BNP (last 3 results) No results for input(s): BNP in the last 8760 hours.  ProBNP (last 3 results) No results for input(s): PROBNP in the last 8760 hours.  CBG: No results for input(s): GLUCAP in the last 168 hours.  Recent Results (from the past 240 hour(s))  Culture, blood (routine x 2)     Status: None (Preliminary result)   Collection Time: 06/04/15  5:20 PM  Result Value Ref Range Status   Specimen Description BLOOD RIGHT ARM  Final   Special Requests BOTTLES DRAWN AEROBIC AND ANAEROBIC 10CC  Final   Culture   Final    NO GROWTH < 24 HOURS Performed at [facility]    Report Status PENDING  Incomplete  Culture, blood (routine x 2)     Status: None (Preliminary result)   Collection Time: 06/04/15  5:35 PM  Result Value Ref Range Status   Specimen Description BLOOD RIGHT ARM  Final   Special Requests IN PEDIATRIC BOTTLE 10CC  Final   Culture   Final    NO GROWTH < 24 HOURS Performed at [facility]    Report Status PENDING  Incomplete      Studies: Dg Chest 2 View  06/04/2015   CLINICAL DATA:  Confusion  EXAM: CHEST  2 VIEW  COMPARISON:  03/07/2015  FINDINGS: New left lower lobe consolidation is identified. Lungs are hypoaerated with crowding of the bronchovascular markings. Bones are subjectively osteopenic. Heart size upper limits of normal. Right lung is grossly clear. Right-sided dual lead pacer in place. Leads/catheter projects over the right hemithorax.  IMPRESSION: New left lower lobe consolidation suspicious for pneumonia.   Electronically Signed   By: Christiana Pellant M.D.   On: 06/04/2015 13:52   Ct Head Wo Contrast  06/04/2015   CLINICAL DATA:  Altered mental status for 1 day. Unable to communicate today. History of Parkinson's for 30 years. Neurostimulator. No previous stroke.  EXAM: CT HEAD WITHOUT CONTRAST  TECHNIQUE: Contiguous axial images were obtained from the base of the skull through the vertex without intravenous contrast.  COMPARISON:  01/15/2015  FINDINGS: Dual leads of deep brain stimulator appears stable. There is significant streak artifact. There is no intra or extra-axial fluid collection or mass lesion. The basilar cisterns and ventricles have a normal appearance. There is no CT evidence for acute infarction or hemorrhage. There is moderate central and cortical atrophy. Periventricular white matter  changes are consistent with small vessel disease. Right frontal encephalomalacia appears stable.  Rounded soft tissue density within the left sphenoid air cell appears stable in may indicate chronic sinus disease versus mucous retention cyst, or polyp. No acute fractures.  IMPRESSION: 1. Stable appearance of deep brain stimulator. 2. Atrophy and small vessel disease. 3.  No evidence for acute intracranial abnormality.   Electronically Signed   By: Norva Pavlov M.D.   On: 06/04/2015 14:01    Scheduled Meds: . aspirin  81 mg Oral QPM  . calcium-vitamin D  2 tablet Oral Q breakfast  . carbidopa-levodopa  1 tablet  Oral QID  . Carbidopa-Levodopa ER  1 tablet Oral QHS  . dapsone  50 mg Oral QAC breakfast  . donepezil  10 mg Oral Daily  . enoxaparin (LOVENOX) injection  40 mg Subcutaneous Q24H  . escitalopram  10 mg Oral Daily  . ezetimibe  10 mg Oral Daily  . levofloxacin (LEVAQUIN) IV  750 mg Intravenous Q24H  . magnesium oxide  400 mg Oral Daily  . mirabegron ER  50 mg Oral Daily  . pantoprazole  40 mg Oral Q1200  . QUEtiapine  25 mg Oral QHS  . tamsulosin  0.4 mg Oral Daily  . vitamin B-12  1,000 mcg Oral Daily  . vitamin C  500 mg Oral BID  . zinc sulfate  220 mg Oral Daily   Continuous Infusions: . sodium chloride 1,000 mL (06/05/15 1313)     Time spent: 25 minutes    Elimelech Houseman  [facility] Pager [phone]. If 7PM-7AM, please contact night-coverage at [email or web address], password [email or web address] 06/05/2015, 7:52 PM  LOS: 1 day

## 2015-06-05 NOTE — Evaluation (Signed)
Physical Therapy Evaluation Patient Details Name: [name] MRN: [id number] DOB: [birth year] Today's Date: 06/05/2015   History of Present Illness  74 year old male with atrial fibrillation/CAD/pacemaker in situ/Parkinson's disease/status post brain stimulator with recent bacteremia(Staph Lugdunensis, Enterobacter cloacae) managed at [facility], in June this year, admitted for altered mental status and HCAP  Clinical Impression  Pt admitted with above diagnosis. Pt currently with functional limitations due to the deficits listed below (see PT Problem List).  Pt will benefit from skilled PT to increase their independence and safety with mobility to allow discharge to the venue listed below.  Spouse not present at time of evaluation and per chart review, spouse would like pt to d/c home.  Pt requiring assist for steadying and mobility at this time.  If spouse unable to provide physical assist, may need SNF.     Follow Up Recommendations SNF;Supervision/Assistance - 24 hour    Equipment Recommendations  None recommended by PT    Recommendations for Other Services       Precautions / Restrictions Precautions Precautions: Fall Precaution Comments: hx Parkinsons      Mobility  Bed Mobility Overal bed mobility: Needs Assistance Bed Mobility: Supine to Sit     Supine to sit: Min assist;HOB elevated     General bed mobility comments: assist for trunk stability, uncoordinated movements of all extremities (possibly baseline due to hx of Parkinsons)  Transfers Overall transfer level: Needs assistance Equipment used: Rolling walker (2 wheeled) Transfers: Sit to/from Stand Sit to Stand: Mod assist;+2 physical assistance         General transfer comment: assist for rise and steadying, posterior lean upon standing requiring assist to correct, difficulty sitting with verbal cues requiring manual cue at hip for flexion  Ambulation/Gait Ambulation/Gait assistance: Mod assist;+2  physical assistance Ambulation Distance (Feet): 8 Feet Assistive device: Rolling walker (2 wheeled) Gait Pattern/deviations: Step-through pattern;Shuffle;Decreased stride length     General Gait Details: poor balance and control of movements therefore brought recliner behind pt to sit, remained on 2L [place]2 [place]  Stairs            Wheelchair Mobility    Modified Rankin (Stroke Patients Only)       Balance Overall balance assessment: Needs assistance         Standing balance support: Bilateral upper extremity supported;During functional activity Standing balance-Leahy Scale: Zero Standing balance comment: requires increased assist and RW for standing and ambulating                             Pertinent Vitals/Pain Pain Assessment: Faces Faces Pain Scale: No hurt    Home Living Family/patient expects to be discharged to:: Private residence Living Arrangements: Spouse/significant other   Type of Home: House       Home Layout: One level Home Equipment: Environmental consultant - 4 wheels      Prior Function Level of Independence: Needs assistance   Gait / Transfers Assistance Needed: ambulates in house mod I per staff (who spoke with spouse)           Hand Dominance        Extremity/Trunk Assessment   Upper Extremity Assessment: Generalized weakness           Lower Extremity Assessment: Generalized weakness;RLE deficits/detail;LLE deficits/detail RLE Deficits / Details: moves LEs against gravity however functional weakness and decreased coordination of gross movement LLE Deficits / Details: moves LEs against gravity however functional weakness and  decreased coordination of gross movement     Communication   Communication: Expressive difficulties  Cognition Arousal/Alertness: Awake/alert Behavior During Therapy: WFL for tasks assessed/performed Overall Cognitive Status: No family/caregiver present to determine baseline cognitive functioning (appropriate  and able to follow commands)                      General Comments      Exercises        Assessment/Plan    PT Assessment Patient needs continued PT services  PT Diagnosis Difficulty walking   PT Problem List Decreased strength;Decreased activity tolerance;Decreased balance;Decreased mobility;Decreased coordination  PT Treatment Interventions DME instruction;Gait training;Functional mobility training;Patient/family education;Therapeutic activities;Therapeutic exercise;Balance training   PT Goals (Current goals can be found in the Care Plan section) Acute Rehab PT Goals Patient Stated Goal: agreeble to PT to assist with mobilizing PT Goal Formulation: With patient Time For Goal Achievement: 06/12/15 Potential to Achieve Goals: Good    Frequency Min 3X/week   Barriers to discharge        Co-evaluation               End of Session Equipment Utilized During Treatment: Gait belt Activity Tolerance: Patient tolerated treatment well Patient left: in chair;with chair alarm set;with call bell/phone within reach;with nursing/sitter in room Nurse Communication:  (NT observed pt ambulating)         Time: 5462-7035 PT Time Calculation (min) (ACUTE ONLY): 12 min   Charges:   PT Evaluation $Initial PT Evaluation Tier I: 1 Procedure     PT G Codes:        Xandra Laramee,KATHrine E 06/05/2015, 1:23 PM Zenovia Jarred, PT, DPT 06/05/2015 Pager: [phone]

## 2015-06-05 NOTE — Progress Notes (Addendum)
Around 6am, I noticed that the patient had developed a rash to his forehead (left side), nose, and neck.  He also had several red welps all over his back/side.  Patient also was developing a rash to both arms (L>R).  MD was notified of this rash and an order for benadryl 25 mg IV was received and given.  MD was informed that patient did have 3 different antibiotics overnight.  Patient had Vancomycin earlier in the shift and appeared fine after medication was complete (although patient has hx of redman syndrome per notes).  Patient was given fortaz around 4 am.  Rash developed a few hours later (noticed rash just before spiking cipro around 6).  After patient was given bath and benadryl for itching, symptoms appeared to be improved.  Welps on back are less red and swollen.  Will continue to monitor patient. Kenton Kingfisher [place]

## 2015-06-06 ENCOUNTER — Encounter (HOSPITAL_COMMUNITY): Payer: Self-pay | Admitting: Internal Medicine

## 2015-06-06 DIAGNOSIS — I4891 Unspecified atrial fibrillation: Secondary | ICD-10-CM

## 2015-06-06 DIAGNOSIS — R9431 Abnormal electrocardiogram [ECG] [EKG]: Secondary | ICD-10-CM | POA: Diagnosis present

## 2015-06-06 DIAGNOSIS — Z950 Presence of cardiac pacemaker: Secondary | ICD-10-CM

## 2015-06-06 DIAGNOSIS — Z9889 Other specified postprocedural states: Secondary | ICD-10-CM

## 2015-06-06 DIAGNOSIS — J690 Pneumonitis due to inhalation of food and vomit: Principal | ICD-10-CM

## 2015-06-06 DIAGNOSIS — I2510 Atherosclerotic heart disease of native coronary artery without angina pectoris: Secondary | ICD-10-CM

## 2015-06-06 DIAGNOSIS — J189 Pneumonia, unspecified organism: Secondary | ICD-10-CM

## 2015-06-06 DIAGNOSIS — F0390 Unspecified dementia without behavioral disturbance: Secondary | ICD-10-CM

## 2015-06-06 DIAGNOSIS — I4581 Long QT syndrome: Secondary | ICD-10-CM

## 2015-06-06 DIAGNOSIS — G20 Parkinson's disease: Secondary | ICD-10-CM

## 2015-06-06 LAB — CBC
HCT: 35.6 % — ABNORMAL LOW (ref 39.0–52.0)
HEMOGLOBIN: 11.2 g/dL — AB (ref 13.0–17.0)
MCH: 27.2 pg (ref 26.0–34.0)
MCHC: 31.5 g/dL (ref 30.0–36.0)
MCV: 86.4 fL (ref 78.0–100.0)
Platelets: 283 10*3/uL (ref 150–400)
RBC: 4.12 MIL/uL — ABNORMAL LOW (ref 4.22–5.81)
RDW: 15.1 % (ref 11.5–15.5)
WBC: 3.3 10*3/uL — ABNORMAL LOW (ref 4.0–10.5)

## 2015-06-06 LAB — URINE CULTURE: CULTURE: NO GROWTH

## 2015-06-06 LAB — BASIC METABOLIC PANEL
Anion gap: 7 (ref 5–15)
BUN: 15 mg/dL (ref 6–20)
CO2: 27 mmol/L (ref 22–32)
Calcium: 8.9 mg/dL (ref 8.9–10.3)
Chloride: 105 mmol/L (ref 101–111)
Creatinine, Ser: 0.82 mg/dL (ref 0.61–1.24)
GLUCOSE: 129 mg/dL — AB (ref 65–99)
Potassium: 4.4 mmol/L (ref 3.5–5.1)
Sodium: 139 mmol/L (ref 135–145)

## 2015-06-06 MED ORDER — DIPHENHYDRAMINE HCL 25 MG PO CAPS: 25.0000 mg | ORAL_CAPSULE | ORAL | Status: DC | PRN

## 2015-06-06 MED ORDER — DOXYCYCLINE CALCIUM 50 MG/5ML PO SYRP
100.0000 mg | ORAL_SOLUTION | Freq: Two times a day (BID) | ORAL | Status: DC
  Administered 2015-06-06 – 2015-06-07 (×3): 100 mg via ORAL
  Filled 2015-06-06 (×6): qty 10

## 2015-06-06 MED ORDER — LORATADINE 5 MG/5ML PO SYRP
10.0000 mg | ORAL_SOLUTION | Freq: Every day | ORAL | Status: DC
  Administered 2015-06-06 – 2015-06-07 (×2): 10 mg via ORAL
  Filled 2015-06-06 (×3): qty 10

## 2015-06-06 MED ORDER — DIPHENHYDRAMINE HCL 50 MG/ML IJ SOLN
25.0000 mg | Freq: Once | INTRAMUSCULAR | Status: AC
  Administered 2015-06-06: 25 mg via INTRAVENOUS
  Filled 2015-06-06: qty 1

## 2015-06-06 NOTE — Progress Notes (Signed)
CSW received referral for New SNF.   CSW revieed chart and noted that pt wife preference is for pt to return home with home health services when medically ready for discharge. PT recommendation for SNF.   CSW visited pt room and pt wife not present at bedside. Pt unable to adequately participate in assessment.   CSW contacted pt wife, Consuella Lose via telephone. CSW introduced self and explained role. Pt wife stated that she was on her way to a doctors appointment and can contact this CSW back shortly. CSW provided CSW contact information.   CSW to await return phone call from pt wife and complete full psychosocial assessment at that time.  Loletta Specter, MSW, LCSW Clinical Social Work [phone]

## 2015-06-06 NOTE — Care Management Important Message (Signed)
Important Message  Patient Details  Name: CHADWIN VIERGUTZ MRN: [id number] Date of Birth: 10-Aug-1941   Medicare Important Message Given:  [facility] notification given    Haskell Flirt 06/06/2015, 12:33 PMImportant Message  Patient Details  Name: TRAYVEN KENDZIERSKI MRN: [id number] Date of Birth: 1941/07/21   Medicare Important Message Given:  Yes-second notification given    Haskell Flirt 06/06/2015, 12:32 PM

## 2015-06-06 NOTE — Progress Notes (Signed)
Progress Note   [name] ZOX:[id number] DOB: [birth year] DOA: 06/04/2015 PCP: Neena Rhymes, MD   Brief Narrative:   [name] is an 74 y.o. male with a PMH of atrial fibrillation/CAD/pacemaker in situ, Parkinson's disease status post brain stimulator with recent bacteremia (Staph Lugdunensis, Enterobacter cloacae) managed at [facility] who was admitted 06/04/15 with healthcare associated aspiration pneumonia.  Assessment/Plan:   Principal Problem:   HCAP (healthcare-associated pneumonia)/aspiration pneumonia in the setting of dysphagia from parkinsonism - Follow up blood cultures. - We'll narrow antibiotics to doxycycline as Levaquin likely contributing to prolonged QT interval. - Given dexamethasone and normal saline bolus for hypotension, now hemodynamically stable. - Continue dysphagia diet with nectar thickened liquids, aspiration precautions.  Active Problems:   Prolonged QT interval - 12-lead EKG obtained secondary to history of prolonged QT interval and on multiple medications that may worsen. - Hold Aricept, discontinue Zofran and Levaquin.    Atrial fibrillation/Cardiac pacemaker in situ - Rate controlled. Not on chronic anticoagulation, likely due to high fall risk.    CAD (coronary artery disease) - Continue aspirin.    Dementia - Continue Seroquel. Hold Aricept given prolonged QTc interval.    Idiopathic Parkinson's disease/Status post deep brain stimulator placement - Continue Sinemet, Myrbetriq,  and dapsone.    Encephalopathy, metabolic - Secondary to acute infection. - CT of the head negative for acute intracranial abnormality.    Allergic reaction - Thought to be secondary to ceftazidime. Given Decadron.    DVT Prophylaxis - Lovenox ordered.  Family Communication: Wife not at bedside.  Told patient to have RN page me when she comes to visit today. Disposition Plan: Home when stable and transitioned to PO antibiotics. Code Status:       Code Status Orders        Start     Ordered   06/04/15 1656  Do not attempt resuscitation (DNR)   Continuous    Question Answer Comment  In the event of cardiac or respiratory ARREST Do not call a "code blue"   In the event of cardiac or respiratory ARREST Do not perform Intubation, CPR, defibrillation or ACLS   In the event of cardiac or respiratory ARREST Use medication by any route, position, wound care, and other measures to relive pain and suffering. May use oxygen, suction and manual treatment of airway obstruction as needed for comfort.      06/04/15 1655        IV Access:    Peripheral IV   Procedures and diagnostic studies:   Dg Chest 2 View  06/04/2015   CLINICAL DATA:  Confusion  EXAM: CHEST  2 VIEW  COMPARISON:  03/07/2015  FINDINGS: New left lower lobe consolidation is identified. Lungs are hypoaerated with crowding of the bronchovascular markings. Bones are subjectively osteopenic. Heart size upper limits of normal. Right lung is grossly clear. Right-sided dual lead pacer in place. Leads/catheter projects over the right hemithorax.  IMPRESSION: New left lower lobe consolidation suspicious for pneumonia.   Electronically Signed   By: Christiana Pellant M.D.   On: 06/04/2015 13:52   Ct Head Wo Contrast  06/04/2015   CLINICAL DATA:  Altered mental status for 1 day. Unable to communicate today. History of Parkinson's for 30 years. Neurostimulator. No previous stroke.  EXAM: CT HEAD WITHOUT CONTRAST  TECHNIQUE: Contiguous axial images were obtained from the base of the skull through the vertex without intravenous contrast.  COMPARISON:  01/15/2015  FINDINGS: Dual leads of  deep brain stimulator appears stable. There is significant streak artifact. There is no intra or extra-axial fluid collection or mass lesion. The basilar cisterns and ventricles have a normal appearance. There is no CT evidence for acute infarction or hemorrhage. There is moderate central and cortical  atrophy. Periventricular white matter changes are consistent with small vessel disease. Right frontal encephalomalacia appears stable.  Rounded soft tissue density within the left sphenoid air cell appears stable in may indicate chronic sinus disease versus mucous retention cyst, or polyp. No acute fractures.  IMPRESSION: 1. Stable appearance of deep brain stimulator. 2. Atrophy and small vessel disease. 3.  No evidence for acute intracranial abnormality.   Electronically Signed   By: Norva Pavlov M.D.   On: 06/04/2015 14:01     Medical Consultants:    None.  Anti-Infectives:    Vancomycin 06/04/2015>  Ceftazidime 06/04/2015> 06/05/2015   Levaquin 06/04/2015>06/06/15  Doxycycline 06/06/15>  Subjective:   [name] denies dyspnea but has coughing spells (non-productive), especially after eating.  Denies pain.  Bowels moving OK.    Objective:    Filed Vitals:   06/05/15 0425 06/05/15 1351 06/05/15 2051 06/06/15 0518  BP: 108/62 84/45 132/77 104/58  Pulse: 64 60 61 60  Temp: 98.6 F (37 C) 98 F (36.7 C) 97.6 F (36.4 C) 98 F (36.7 C)  TempSrc: Axillary Oral Oral Oral  Resp: Height:      Weight:      SpO2: 98% 92% 97% 97%    Intake/Output Summary (Last 24 hours) at 06/06/15 0818 Last data filed at 06/06/15 0517  Gross per 24 hour  Intake    930 ml  Output   3150 ml  Net  -2220 ml    Exam: Gen:  NAD Cardiovascular:  RRR, No M/R/G Respiratory:  Lungs CTAB Gastrointestinal:  Abdomen soft, NT/ND, + BS Extremities:  No C/E/C   Data Reviewed:    Labs: Basic Metabolic Panel:  Recent Labs Lab 06/04/15 1240 06/04/15 1720 06/05/15 0433 06/06/15 0431  NA 138  --  138 139  K 4.2  --  4.1 4.4  CL 104  --  105 105  CO2 28  --  27 27  GLUCOSE 106*  --  100* 129*  BUN 21*  --  20 15  CREATININE 0.82 0.91 0.91 0.82  CALCIUM 9.2  --  8.6* 8.9  MG  --   --  1.8  --   PHOS  --   --  3.8  --    GFR Estimated Creatinine Clearance: 78.7  mL/min (by C-G formula based on Cr of 0.82). Liver Function Tests:  Recent Labs Lab 06/04/15 1240 06/05/15 0433  AST 60* 36  ALT 15* 8*  ALKPHOS 90 74  BILITOT 0.8 0.9  PROT 6.7 6.0*  ALBUMIN 3.8 3.3*    Recent Labs Lab 06/04/15 1240  AMMONIA <9*   Coagulation profile  Recent Labs Lab 06/04/15 1240 06/05/15 0433  INR 1.20 1.32    CBC:  Recent Labs Lab 06/04/15 1240 06/04/15 1720 06/05/15 0433 06/06/15 0431  WBC 9.3 9.3 5.4 3.3*  NEUTROABS 7.6  --  3.2  --   HGB 11.3* 10.7* 10.8* 11.2*  HCT 36.2* 35.0* 34.0* 35.6*  MCV 85.2 85.8 86.7 86.4  PLT 271 264 240 283    Microbiology Recent Results (from the past 240 hour(s))  Culture, blood (routine x 2)     Status: None (Preliminary result)   Collection  Time: 06/04/15  5:20 PM  Result Value Ref Range Status   Specimen Description BLOOD RIGHT ARM  Final   Special Requests BOTTLES DRAWN AEROBIC AND ANAEROBIC 10CC  Final   Culture   Final    NO GROWTH < 24 HOURS Performed at [facility]    Report Status PENDING  Incomplete  Culture, blood (routine x 2)     Status: None (Preliminary result)   Collection Time: 06/04/15  5:35 PM  Result Value Ref Range Status   Specimen Description BLOOD RIGHT ARM  Final   Special Requests IN PEDIATRIC BOTTLE 10CC  Final   Culture   Final    NO GROWTH < 24 HOURS Performed at [facility]    Report Status PENDING  Incomplete     Medications:   . aspirin  81 mg Oral QPM  . calcium-vitamin D  2 tablet Oral Q breakfast  . carbidopa-levodopa  1 tablet Oral QID  . Carbidopa-Levodopa ER  1 tablet Oral QHS  . dapsone  50 mg Oral QAC breakfast  . donepezil  10 mg Oral Daily  . enoxaparin (LOVENOX) injection  40 mg Subcutaneous Q24H  . escitalopram  10 mg Oral Daily  . ezetimibe  10 mg Oral Daily  . levofloxacin (LEVAQUIN) IV  750 mg Intravenous Q24H  . magnesium oxide  400 mg Oral Daily  . mirabegron ER  50 mg Oral Daily  . pantoprazole  40 mg Oral Q1200  .  QUEtiapine  25 mg Oral QHS  . tamsulosin  0.4 mg Oral Daily  . vitamin B-12  1,000 mcg Oral Daily  . vitamin C  500 mg Oral BID  . zinc sulfate  220 mg Oral Daily   Continuous Infusions: . sodium chloride 1,000 mL (06/06/15 0420)    Time spent: 35 minutes.  The patient is medically complex and requires high complexity decision making.   LOS: 2 days   Brittiny Levitz  [facility] Pager [phone]. If unable to reach me by pager, please call my cell phone at [phone].  *Please refer to [email or web address], password [email or web address] to get updated schedule on who will round on this patient, as hospitalists switch teams weekly. If 7PM-7AM, please contact night-coverage at [email or web address], password [email or web address] for any overnight needs.  06/06/2015, 8:18 AM

## 2015-06-06 NOTE — Clinical Social Work Note (Signed)
Clinical Social Work Assessment  Patient Details  Name: [name] MRN: [id number] Date of Birth: [birth year]  Date of referral:  06/06/15               Reason for consult:  Discharge Planning                Permission sought to share information with:  Family Supports Permission granted to share information::  No (pt unable to participate in assessment secondary to dementia/parkinson's disease)  Name::     [name]  Agency::     Relationship::  wife  Contact Information:  [phone]  Housing/Transportation Living arrangements for the past 2 months:  Single Family Home Source of Information:  Spouse Patient Interpreter Needed:  None Criminal Activity/Legal Involvement Pertinent to Current Situation/Hospitalization:  No - Comment as needed Significant Relationships:  Spouse Lives with:  Spouse Do you feel safe going back to the place where you live?  Yes Need for family participation in patient care:  Yes (Comment)  Care giving concerns:  Pt admitted from home with pt wife. Pt requiring 2 person assist with ADL's.    Social Worker assessment / plan:  CSW received referral for New SNF.   CSW received return phone call from pt wife, [name]. Pt wife confirmed that pt from home with her. Pt wife states that pt has been doing very well at home. CSW discussed with pt wife that pt displayed weakness with PT evaluation and recommended rehab at [facility]. Pt wife states that she feels that pt did not perform well with PT because pt is supposed to have his parkinson's medication at the same time daily and the times that the pt has gotten the medication in the hospital have been inconsistent. Pt wife states that the medication helps pt to walk and if not given at the same time then he does not do as well with his mobility. Pt wife states that she plans for pt to return home upon discharge. Pt wife states that pt wife has someone to assist in the home two days a week for 6 hours. CSW inquired  with pt wife about if she feels pt would benefit from Home health PT and pt wife stated that she would decide closer to time of pt discharge.  CSW notified RNCM that pt wife wishes for pt to return home upon discharge.  No further social work needs identified at this time.  CSW signing off.   Employment status:  Retired Forensic scientist:  [employer] PT Recommendations:  [place], [facility] / Referral to community resources:  Other (Comment Required) (referral to [facility] as pt wife declining SNF)  Patient/Family's Response to care:  Pt with dementia and parkinson's and not appropriate for participation in disposition. Pt wife supportive and actively involved in pt care and states that pt has been making good progress at home and confident that pt needs can be met at home.  Patient/Family's Understanding of and Emotional Response to Diagnosis, Current Treatment, and Prognosis:  Pt wife displayed strong understanding about pt diagnosis and treatment. Pt wife expressed that she felt that pt did not perform as well with PT due to not having parkinson medication at the same time as he usually does when he was at home.   Emotional Assessment Appearance:  Appears stated age Attitude/Demeanor/Rapport:  Unable to Assess (pt being helped by nursing at this time and pt has dementia/parkinsons disease and unable to participate in assessment) Affect (  typically observed):  Unable to Assess Orientation:  Oriented to Self, Oriented to Place Alcohol / Substance use:  Not Applicable Psych involvement (Current and /or in the community):  No (Comment)  Discharge Needs  Concerns to be addressed:  Discharge Planning Concerns Readmission within the last 30 days:  No Current discharge risk:  Physical Impairment Barriers to Discharge:  No Barriers Identified   KIDD, [place], LCSW 06/06/2015, 1:32 PM  [phone]

## 2015-06-06 NOTE — Care Management Note (Signed)
Case Management Note  Patient Details  Name: [name] MRN: [id number] Date of Birth: Apr 11, 1941  Subjective/Objective:        74 yo admitted with HCAP. Hx of Parkinson's Disease           Action/Plan: From home with wife  Expected Discharge Date:   (unknown)               Expected Discharge Plan:  Home/Self Care  In-House Referral:  Clinical Social Work  Discharge planning Services  CM Consult  Post Acute Care Choice:  Home Health Choice offered to:  Spouse  DME Arranged:    DME Agency:     HH Arranged:    [place] Agency:     Status of Service:  In process, will continue to follow  Medicare Important Message Given:  Yes-second notification given Date Medicare IM Given:    Medicare IM give by:    Date Additional Medicare IM Given:    Additional Medicare Important Message give by:     If discussed at [place] of Stay Meetings, dates discussed:    Additional Comments: This CM met with pt and wife at bedside to discuss DC needs. PT is recommending SNF. [name] states that he does fine at home and will not need SNF or home health services. She states that he has not been moving around as well in the hospital because he has not been getting his Sinemet on the same schedule he does at home. Wife states that he has a rollator, arms on his toilet seat and a tub chair at home. She states that he sees a Physiological scientist several times a week and has a private duty caregiver that comes twice a week for 6 hours. Pt and wife can not think of anything additionally that they need at home. [facility] provider list left with wife in case she gets home and changes her mind about wanting home health services. If so she was encouraged to call her PCP to get assistance with setting up [facility] services.  Lynnell Catalan, RN 06/06/2015, 2:04 PM

## 2015-06-07 LAB — CBC
HEMATOCRIT: 34.5 % — AB (ref 39.0–52.0)
Hemoglobin: 10.8 g/dL — ABNORMAL LOW (ref 13.0–17.0)
MCH: 27.1 pg (ref 26.0–34.0)
MCHC: 31.3 g/dL (ref 30.0–36.0)
MCV: 86.7 fL (ref 78.0–100.0)
Platelets: 314 10*3/uL (ref 150–400)
RBC: 3.98 MIL/uL — ABNORMAL LOW (ref 4.22–5.81)
RDW: 15.1 % (ref 11.5–15.5)
WBC: 5.2 10*3/uL (ref 4.0–10.5)

## 2015-06-07 MED ORDER — DOXYCYCLINE CALCIUM 50 MG/5ML PO SYRP: 100.0000 mg | ORAL_SOLUTION | Freq: Two times a day (BID) | ORAL | Status: DC

## 2015-06-07 NOTE — Discharge Summary (Signed)
Physician Discharge Summary  [name]:[id number] DOB: 1941-02-17 DOA: 06/04/2015  PCP: Neena Rhymes, MD  Admit date: 06/04/2015 Discharge date: 06/07/2015   Recommendations for Outpatient Follow-Up:   1. The patient is being discharged home to the care of his wife. Declines SNF placement. 2. PCP, please follow-up on final blood culture results.   Discharge Diagnosis:   Principal Problem:    HCAP (healthcare-associated pneumonia)/aspiration pneumonia Active Problems:    Atrial fibrillation    Cardiac pacemaker in situ    CAD (coronary artery disease)    Dementia    Idiopathic Parkinson's disease    Status post deep brain stimulator placement    Encephalopathy, metabolic    Allergic reaction    Prolonged Q-T interval on ECG   Discharge disposition:  Home.    Discharge Condition: Improved.  Diet recommendation: Low-sodium, heart healthy.   History of Present Illness:   [name] is an 74 y.o. male with a PMH of atrial fibrillation/CAD/pacemaker in situ, Parkinson's disease status post brain stimulator with recent bacteremia (Staph Lugdunensis, Enterobacter cloacae) managed at [facility] who was admitted 06/04/15 with healthcare associated aspiration pneumonia.  Hospital Course by Problem:   Principal Problem:  HCAP (healthcare-associated pneumonia)/aspiration pneumonia in the setting of dysphagia from parkinsonism - Blood cultures negative to date. - Discharged on an additional 5 days of therapy with doxycycline. - Continue dysphagia diet with nectar thickened liquids, aspiration precautions.  Active Problems:  Prolonged QT interval - 12-lead EKG obtained secondary to history of prolonged QT interval and on multiple medications that may worsen. - Held Aricept, and discontinued Zofran and Levaquin. - Can resume Aricept at discharge.   Atrial fibrillation/Cardiac pacemaker in situ - Rate controlled. Not on chronic anticoagulation,  likely due to high fall risk.   CAD (coronary artery disease) - Continue aspirin.   Dementia - Continue Seroquel. Okay to resume Aricept at discharge.   Idiopathic Parkinson's disease/Status post deep brain stimulator placement - Continue Sinemet, Myrbetriq, and dapsone.   Encephalopathy, metabolic - Secondary to acute infection. - CT of the head negative for acute intracranial abnormality.   Allergic reaction - Thought to be secondary to ceftazidime. Given Decadron and subsequently placed on an anti-histamine with resolution.    Medical Consultants:    None.   Discharge Exam:   Filed Vitals:   06/07/15 0640  BP: 109/70  Pulse: 61  Temp: 98.1 F (36.7 C)  Resp: 16   Filed Vitals:   06/06/15 0518 06/06/15 1500 06/06/15 2038 06/07/15 0640  BP: 104/58 131/74 133/72 109/70  Pulse: 60 77 64 61  Temp: 98 F (36.7 C) 98.1 F (36.7 C) 97.4 F (36.3 C) 98.1 F (36.7 C)  TempSrc: Oral Oral Oral Oral  Resp: 16 18 18 16   Height:      Weight:      SpO2: 97% 98% 99% 94%    Gen:  NAD Cardiovascular:  RRR, No M/R/G Respiratory: Lungs decreased Gastrointestinal: Abdomen soft, NT/ND with normal active bowel sounds. Extremities: No C/E/C   The results of significant diagnostics from this hospitalization (including imaging, microbiology, ancillary and laboratory) are listed below for reference.     Procedures and Diagnostic Studies:   Dg Chest 2 View  06/04/2015   CLINICAL DATA:  Confusion  EXAM: CHEST  2 VIEW  COMPARISON:  03/07/2015  FINDINGS: New left lower lobe consolidation is identified. Lungs are hypoaerated with crowding of the bronchovascular markings. Bones are subjectively osteopenic. Heart size upper limits of  normal. Right lung is grossly clear. Right-sided dual lead pacer in place. Leads/catheter projects over the right hemithorax.  IMPRESSION: New left lower lobe consolidation suspicious for pneumonia.   Electronically Signed   By: Christiana Pellant  M.D.   On: 06/04/2015 13:52   Ct Head Wo Contrast  06/04/2015   CLINICAL DATA:  Altered mental status for 1 day. Unable to communicate today. History of Parkinson's for 30 years. Neurostimulator. No previous stroke.  EXAM: CT HEAD WITHOUT CONTRAST  TECHNIQUE: Contiguous axial images were obtained from the base of the skull through the vertex without intravenous contrast.  COMPARISON:  01/15/2015  FINDINGS: Dual leads of deep brain stimulator appears stable. There is significant streak artifact. There is no intra or extra-axial fluid collection or mass lesion. The basilar cisterns and ventricles have a normal appearance. There is no CT evidence for acute infarction or hemorrhage. There is moderate central and cortical atrophy. Periventricular white matter changes are consistent with small vessel disease. Right frontal encephalomalacia appears stable.  Rounded soft tissue density within the left sphenoid air cell appears stable in may indicate chronic sinus disease versus mucous retention cyst, or polyp. No acute fractures.  IMPRESSION: 1. Stable appearance of deep brain stimulator. 2. Atrophy and small vessel disease. 3.  No evidence for acute intracranial abnormality.   Electronically Signed   By: Norva Pavlov M.D.   On: 06/04/2015 14:01     Labs:   Basic Metabolic Panel:  Recent Labs Lab 06/04/15 1240 06/04/15 1720 06/05/15 0433 06/06/15 0431  NA 138  --  138 139  K 4.2  --  4.1 4.4  CL 104  --  105 105  CO2 28  --  27 27  GLUCOSE 106*  --  100* 129*  BUN 21*  --  20 15  CREATININE 0.82 0.91 0.91 0.82  CALCIUM 9.2  --  8.6* 8.9  MG  --   --  1.8  --   PHOS  --   --  3.8  --    GFR Estimated Creatinine Clearance: 78.7 mL/min (by C-G formula based on Cr of 0.82). Liver Function Tests:  Recent Labs Lab 06/04/15 1240 06/05/15 0433  AST 60* 36  ALT 15* 8*  ALKPHOS 90 74  BILITOT 0.8 0.9  PROT 6.7 6.0*  ALBUMIN 3.8 3.3*    Recent Labs Lab 06/04/15 1240  AMMONIA <9*    Coagulation profile  Recent Labs Lab 06/04/15 1240 06/05/15 0433  INR 1.20 1.32    CBC:  Recent Labs Lab 06/04/15 1240 06/04/15 1720 06/05/15 0433 06/06/15 0431 06/07/15 0557  WBC 9.3 9.3 5.4 3.3* 5.2  NEUTROABS 7.6  --  3.2  --   --   HGB 11.3* 10.7* 10.8* 11.2* 10.8*  HCT 36.2* 35.0* 34.0* 35.6* 34.5*  MCV 85.2 85.8 86.7 86.4 86.7  PLT 271 264 240 283 314   Microbiology Recent Results (from the past 240 hour(s))  Culture, blood (routine x 2)     Status: None (Preliminary result)   Collection Time: 06/04/15  5:20 PM  Result Value Ref Range Status   Specimen Description BLOOD RIGHT ARM  Final   Special Requests BOTTLES DRAWN AEROBIC AND ANAEROBIC 10CC  Final   Culture   Final    NO GROWTH 3 DAYS Performed at [facility]    Report Status PENDING  Incomplete  Culture, blood (routine x 2)     Status: None (Preliminary result)   Collection Time: 06/04/15  5:35  PM  Result Value Ref Range Status   Specimen Description BLOOD RIGHT ARM  Final   Special Requests IN PEDIATRIC BOTTLE 10CC  Final   Culture   Final    NO GROWTH 3 DAYS Performed at [facility]    Report Status PENDING  Incomplete  Urine culture     Status: None   Collection Time: 06/05/15  2:54 AM  Result Value Ref Range Status   Specimen Description URINE, RANDOM  Final   Special Requests NONE  Final   Culture   Final    NO GROWTH 1 DAY Performed at [facility]    Report Status 06/06/2015 FINAL  Final     Discharge Instructions:   Discharge Instructions    Call MD for:  difficulty breathing, headache or visual disturbances    Complete by:  As directed      Call MD for:  extreme fatigue    Complete by:  As directed      Call MD for:  temperature >100.4    Complete by:  As directed      Diet - low sodium heart healthy    Complete by:  As directed      Increase activity slowly    Complete by:  As directed             Medication List    STOP taking these  medications        guaiFENesin 600 MG 12 hr tablet  Commonly known as:  MUCINEX      TAKE these medications        aspirin 81 MG tablet  Take 81 mg by mouth daily.     Calcium-Vitamin D 600-400 MG-UNIT Tabs  Take 2 tablets by mouth daily.     carbidopa-levodopa 25-250 MG per tablet  Commonly known as:  SINEMET IR  Take 1 tablet by mouth 4 (four) times daily.     Carbidopa-Levodopa ER 25-100 MG tablet controlled release  Commonly known as:  SINEMET CR  Take 1 tablet by mouth at bedtime.     dapsone 25 MG tablet  Take 50 mg by mouth every morning.     docusate sodium 100 MG capsule  Commonly known as:  COLACE  Take 1 capsule (100 mg total) by mouth 2 (two) times daily as needed for mild constipation.     donepezil 10 MG tablet  Commonly known as:  ARICEPT  Take 10 mg by mouth daily.     doxycycline 50 MG/5ML Syrp  Commonly known as:  VIBRAMYCIN  Take 10 mLs (100 mg total) by mouth 2 (two) times daily.     escitalopram 10 MG tablet  Commonly known as:  LEXAPRO  Take 1 tablet (10 mg total) by mouth daily.     Magnesium 400 MG Caps  Take 400 mg by mouth daily.     mirabegron ER 50 MG Tb24 tablet  Commonly known as:  MYRBETRIQ  Take 1 tablet (50 mg total) by mouth daily.     nitroGLYCERIN 0.4 MG SL tablet  Commonly known as:  NITROSTAT  Place 0.4 mg under the tongue every 5 (five) minutes as needed for chest pain.     QUEtiapine 25 MG tablet  Commonly known as:  SEROQUEL  Take 25 mg by mouth at bedtime.     tamsulosin 0.4 MG Caps capsule  Commonly known as:  FLOMAX  Take 1 capsule (0.4 mg total) by mouth daily.  vitamin B-12 1000 MCG tablet  Commonly known as:  CYANOCOBALAMIN  Take 1 tablet (1,000 mcg total) by mouth daily.     vitamin C 500 MG tablet  Commonly known as:  ASCORBIC ACID  Take 500 mg by mouth 2 (two) times daily.     ZETIA 10 MG tablet  Generic drug:  ezetimibe  TAKE 1 TABLET BY MOUTH EVERY DAY     zinc gluconate 50 MG tablet  Take  50 mg by mouth daily.           Follow-up Information    Follow up with Neena Rhymes, MD. Schedule an appointment as soon as possible for a visit in 1 week.   Specialty:  Family Medicine   Why:  Hospital follow up.   Contact information:   [address] [place] [address] [phone]        Time coordinating discharge: 35 minutes.  Signed:  Tiny Rietz  Pager [phone] [facility] 06/07/2015, 4:52 PM

## 2015-06-07 NOTE — Discharge Instructions (Signed)
Aspiration Precautions Aspiration is the inhaling of a liquid or object into the lungs. Things that can be inhaled into the lungs include:  Food.  Any type of liquid, such as drinks or saliva.  Stomach contents, such as vomit or stomach acid. When these things go into the lungs, damage can occur. Serious complications can then result, such as:  A lung infection (pneumonia).  A collection of pus in the lungs (lung abscess). CAUSES  A decreased level of awareness (consciousness) due to:  Traumatic brain injury or head injury.  Stroke.  Neurological disease.  Seizures.  Decreased or absent gag reflex (inability to cough).  Medical conditions that affect swallowing.  Conditions that affect the food pipe (esophagus) such as a narrowing of the esophagus (esophageal stricture).  Gastroesophageal reflux (GERD). This is also known as acid reflux.  Any type of surgery where you are put under general anesthesia or have sedation.  Drinking large amounts of alcohol.  Taking medication that causes drowsiness, confusion, or weakness.  Aging.  Dental problems.  Having a feeding tube. SYMPTOMS When aspiration occurs, different signs and symptoms can occur, such as:  Coughing (if a person has a cough or gag reflex) after swallowing food or liquids.  Difficulty breathing. This can include things like:  Breathing rapidly.  Breathing very slowly.  Loud breathing.  Hearing "gurgling" lung sounds when a person breaths.  Coughing up phlegm (sputum) that is:  Yellow, tan, or green in color.  Has pieces of food in it.  Bad smelling.  A change in voice (hoarseness) or a "gurgly" sound to the voice.  A change in skin color. The skin may turn red, or a "bluish" type color because of a lack of oxygen (cyanosis).  Fever.  Eyes watering.  Pain in the chest or back.  Facial grimacing .  A feeling of fullness in the throat or that something is stuck in the  throat. DIAGNOSIS  A chest X-ray may be performed. This takes a picture of your lungs. It can show changes in the lungs if aspiration has occurred.  A bronchoscopy may be performed. This is a surgical procedure in which a thin, flexible tube with a camera at the end is inserted into the nose or mouth. The tube is advanced to the lungs so your health care provider can view the lungs and obtain a culture, tissue sample, or remove an aspirated object.  A swallowing evaluation study may be performed to evaluate:  A person's risk of aspiration.  How difficult it is for a person to swallow.  What types of foods are safe for a person to eat. PREVENTION If you are a caregiver to someone who may aspirate, follow the directions below. If you are caring for someone who can eat and drink through their mouth:  Have them sit in an upright position when eating food or drinking fluids, such as:  Sitting up in a chair.  If sitting in a chair is not possible, position the person in bed so they are upright.  Remind the person to eat slowly and chew well.  Do not distract the person. This is especially important for people with thinking or memory (cognitive) problems.  Check the person's mouth for leftover food after eating.  Keep the person sitting upright for 30 to 45 minutes after eating.  Do not serve food or drink for at least 2 hours before bedtime. If you are caring for someone with a feeding tube and he or she   cannot eat or drink through their mouth:  Keep the person in an upright position as much as possible.  Do not  lay the person flat if they are getting continuous feedings. Turn the feeding pump off if you need to lay the person flat for any reason.  Check feeding tube residuals as directed by your health care provider. If a large amount of tube feedings are pulled back (aspirated) from the feeding tube, call your health care provider right away. General guidelines to prevent  aspiration include:  Feed small amounts of food. Do not force feed.  Use as little water as possible when brushing the person's teeth or cleaning his or her mouth.  Provide oral care before and after meals.  Never put food or fluids in the mouth of a person who is not fully alert.  Crush pills and put them in soft food such as pudding or ice cream. Some pills should not be crushed. Check with your health care provider before crushing any medication. SEEK IMMEDIATE MEDICAL CARE IF:   The person has trouble breathing or starts to breathe rapidly.  The person is breathing very slowly or stops breathing.  The person coughs a lot after eating or drinking.  The person has a chronic cough.  The person coughs up thick, yellow, or tan sputum.  The person has a fever or persistent symptoms for more than 72 hours.  The person has a fever and their symptoms suddenly get worse. Document Released: 11/29/2010 Document Revised: 11/01/2013 Document Reviewed: 02/01/2014 Exit[employer] Patient Information 2015 [employer]. This information is not intended to replace advice given to you by your health care provider. Make sure you discuss any questions you have with your health care provider.  

## 2015-06-07 NOTE — Progress Notes (Signed)
Physical Therapy Treatment Patient Details Name: ABE SCHOOLS MRN: [id number] DOB: Mar 01, 1941 Today's Date: 06/07/2015    History of Present Illness 74 year old male with atrial fibrillation/CAD/pacemaker in situ/Parkinson's disease/status post brain stimulator with recent bacteremia(Staph Lugdunensis, Enterobacter cloacae) managed at [facility], in June this year, admitted for altered mental status and HCAP    PT Comments    Pt mobilizing better today however still with occasional need for min assist to steady.  Per chart, spouse plans to take pt home, declines SNF.  Follow Up Recommendations  Home health PT;Supervision/Assistance - 24 hour     Equipment Recommendations  None recommended by PT    Recommendations for Other Services       Precautions / Restrictions Precautions Precautions: Fall Precaution Comments: hx Parkinsons Restrictions Weight Bearing Restrictions: No    Mobility  Bed Mobility Overal bed mobility: Needs Assistance Bed Mobility: Supine to Sit     Supine to sit: Supervision     General bed mobility comments: increased time and effort  Transfers Overall transfer level: Needs assistance Equipment used: Rolling walker (2 wheeled) Transfers: Sit to/from Stand Sit to Stand: Min assist         General transfer comment: assist for rise, posterior lean upon standing however able to self correct today, improved transfer since previous visit, more assist to steady from rise from low toilet  Ambulation/Gait Ambulation/Gait assistance: Min assist Ambulation Distance (Feet): 100 Feet (x2) Assistive device: Rolling walker (2 wheeled) Gait Pattern/deviations: Step-through pattern;Scissoring;Narrow base of support;Decreased stride length;Drifts right/left     General Gait Details: more control of movement observed today, several occasions for assist to steady usually due to pt upper body or head turning to look around environment, ambulated 100 feet  then pt needed to use bathroom for BM then ambulated another 100 feet   Stairs            Wheelchair Mobility    Modified Rankin (Stroke Patients Only)       Balance                                    Cognition Arousal/Alertness: Awake/alert Behavior During Therapy: WFL for tasks assessed/performed Overall Cognitive Status: Within Functional Limits for tasks assessed (more communicative today)                      Exercises      General Comments        Pertinent Vitals/Pain Pain Assessment: No/denies pain    Home Living                      Prior Function            PT Goals (current goals can now be found in the care plan section) Progress towards PT goals: Progressing toward goals    Frequency  Min 3X/week    PT Plan Current plan remains appropriate    Co-evaluation             End of Session Equipment Utilized During Treatment: Gait belt Activity Tolerance: Patient tolerated treatment well Patient left: in chair;with chair alarm set;with call bell/phone within reach     Time: 1029-1053 PT Time Calculation (min) (ACUTE ONLY): 24 min  Charges:  $Gait Training: 23-37 mins                    G  CodesSarajane Jews 2015/06/13, 11:43 AM Zenovia Jarred, PT, DPT 06-13-15 Pager: [phone]

## 2015-06-09 LAB — CULTURE, BLOOD (ROUTINE X 2)
Culture: NO GROWTH
Culture: NO GROWTH

## 2015-06-11 ENCOUNTER — Telehealth: Payer: Self-pay | Admitting: *Deleted

## 2015-06-11 NOTE — Telephone Encounter (Addendum)
Transition Care Management Follow-up Telephone Call   Recommendations for Outpatient Follow-Up:   1. The patient is being discharged home to the care of his wife. Declines SNF placement. 2. PCP, please follow-up on final blood culture results.          Date discharged? 06/07/15   How have you been since you were released from the hospital? Per the wife, he's doing great; needs very little help; went to church on Sunday.   Do you understand why you were in the hospital? Yes- wife states they did CXR and showed pneumonia, but not in lobe that would indicate aspiration   Do you understand the discharge instructions? Yes; nectar thick liquids; speech therapy begins soon    Where were you discharged to? Home with wife   Items Reviewed:  Medications reviewed: yes  Allergies reviewed: yes  Dietary changes reviewed: Yes- nectar thick liquids   Referrals reviewed: NA   Functional Questionnaire:   Activities of Daily Living (ADLs):   He states they are independent in the following: ambulation, bathing and hygiene, feeding, continence, grooming, toileting, dressing and (wife helping with getting up from seated, setting up bath) States they require assistance with the following: none   Any transportation issues/concerns?: no   Any patient concerns? no   Confirmed importance and date/time of follow-up visits scheduled yes, 06/18/15 at 11 am.  Provider Appointment booked with Dr. Beverely Low  Confirmed with patient if condition begins to worsen call PCP or go to the ER.  Patient was given the office number and encouraged to call back with question or concerns.  : yes

## 2015-06-13 ENCOUNTER — Ambulatory Visit: Payer: Medicare Other | Attending: Physical Medicine & Rehabilitation

## 2015-06-13 DIAGNOSIS — R471 Dysarthria and anarthria: Secondary | ICD-10-CM | POA: Insufficient documentation

## 2015-06-13 DIAGNOSIS — R41841 Cognitive communication deficit: Secondary | ICD-10-CM

## 2015-06-13 DIAGNOSIS — R1310 Dysphagia, unspecified: Secondary | ICD-10-CM

## 2015-06-13 NOTE — Patient Instructions (Signed)
(  see "media" for scanned HEP)

## 2015-06-14 NOTE — Therapy (Signed)
[facility] [address] [place], [place], [address] Phone: [phone]   Fax:  [phone]  Speech Language Pathology Evaluation  Patient Details  Name: DEMOND SHALLENBERGER MRN: [id number] Date of Birth: 1940-11-15 Referring Provider:  Renato Gails, MD  Encounter Date: 06/13/2015      End of Session - 06/13/15 1640    Visit Number 1   Number of Visits 16   Date for SLP Re-Evaluation 08/10/15   SLP Start Time 1319   SLP Stop Time  1400   SLP Time Calculation (min) 41 min   Activity Tolerance --  pt limited somewhat by decr'd attention      Past Medical History  Diagnosis Date  . Parkinson disease   . Bradycardia     syncope due to prolonged pauses, resolved with PPM  . Sleep apnea   . Hypertension   . Dermatitis   . Depression   . Rib fractures 2011    S/p fall  . Maxillary fracture 2009    Right  . BPH (benign prostatic hypertrophy)   . Coronary artery disease 1986    Inferior wall MI treated with Angioplasty  . Normal echocardiogram 2007    Mild LVH with Impaired relaxation, Mild-Mod. Aortic Root dilation, Mild Aortic Sclerosis, Mild MR, Mild TR, Ef-50-55%  . Normal nuclear stress test 2006    Normal EF-54%  . CAD (coronary artery disease)   . Inferior MI   . 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 Maxillary fracture 2009    Right side  . Rib fracture 07/2010  . Atrioventricular block, complete   . Pacemaker   . H/O hiatal hernia   . Posterior vitreous detachment 03/04/2012  . Herpetic gingivostomatitis 01/19/2015  . Olecranon bursitis of left elbow 01/19/2015  . Infection and inflammatory reaction due to nervous system device, implant, and graft 05/15/2015    Past Surgical History  Procedure Laterality Date  . Pacemaker insertion  10/28/10    MDT implanted by Dr Ladona Ridgel  . Deep brain stimulator placement      Parkinsons  . Arthroscopic knee      Left knee  . Cataract surgery      Bilateral   . Inguinal hernia repair      Bilateral  . Cardiac catheterization      Multiple Angioplasties, last 2002  . Removal of ganglion cyst      right wrist  . Insertion / placement / revision neurostimulator    . Eye surgery    . Insert / replace / remove pacemaker      There were no vitals filed for this visit.  Visit Diagnosis: Dysphagia  Cognitive communication deficit  Dysarthria      Subjective Assessment - 06/13/15 1637    Subjective "We're here because of this (showing SLP swalowing HEP from [facility])." (wife) SLP asked pt to repeat x3 before pt was understood by this trained listener.   Patient is accompained by: Family member  wife            SLP Evaluation [facility] - 06/13/15 1324    Subjective   Subjective pt discharged from [facility] for PNA last week. Discharged from [facility]) in June 30 for revision of infection of DBS. At [facility] for two weeks.   General Information   Behavioral/Cognition SLP needed to get eye contact with pt at times during eval for pt focus   Mobility Status 4-wheeled walker  Prior Functional Status   Cognitive/Linguistic Baseline Baseline deficits    Lives With Spouse   Cognition   Overall Cognitive Status History of cognitive impairments - at baseline   Attention Sustained   Sustained Attention Impaired  SLP necessary to gain eye contact with pt for focus at times   Sustained Attention Impairment Verbal basic;Verbal complex;Functional basic;Functional complex   Awareness Impaired   Awareness Impairment Emergent impairment;Anticipatory impairment   Behaviors Restless   Motor Speech   Overall Motor Speech Impaired at baseline   Respiration Impaired   Level of Impairment Phrase   Phonation Breathy;Low vocal intensity   Articulation Impaired   Level of Impairment Phrase   Intelligibility Intelligibility reduced   Word 50-74% accurate   Phrase 50-74% accurate   Sentence 50-74% accurate   Conversation 50-74% accurate       SLP reviewed pt's modified barium swallow results with him and wife. Pt wife cont to ask pt if he comprehended SLP message/explanation. SLP simplified language for pt's benefit due to likely decr'd sustained/selective attention. SLP then reviewed pt's swallow precautions with him and wife. SLP told pt rationale for precautions and exercises after learning that pt has had two bouts with PNA in the last 16-18 months. Pt's HEP was the reviewed with pt and wife, with SLP requiring to provide min to mod verbal cues for patient and min cues to wife for how to best work with pt's HEP at home with him. Wife stated to SLP she understood procedure of exercises on HEP in order to A pt complete appropriately.  [name] reported pt had not completed HEP at frequency prescribed after his discharge from [facility]. SLP very direct with patient stating that he needed to complete HEP as directed, and with wife if necessary, to improve his swallowing. Further, that if he cont to have episodes of PNA due to unsafe swallowing a feeding tube may be brought up by his MDs.He demo'd understanding of this.                   SLP Education - 06/13/15 1451    Education provided Yes   Education Details HEP, nectar liquids (V8, buttermilk, etc), need for pt participation with HEP as prescribed for best chance at improving swallow function, rationale for HEP and precautions   Person(s) Educated Patient;Spouse   Methods Explanation;Demonstration;Verbal cues;Tactile cues;Handout   Comprehension Verbalized understanding;Returned demonstration;Verbal cues required;Tactile cues required;Need further instruction          SLP Short Term Goals - 06/14/15 1655    SLP SHORT TERM GOAL #1   Title pt will complete HEP with consistent min A from wife or SLP   Time 4   Period Weeks   Status New   SLP SHORT TERM GOAL #2   Title pt will follow swallow precautions with consistent min A from wife or SLP with POs   Time 4    Period Weeks   Status New   SLP SHORT TERM GOAL #3   Title pt will complete HEP at least 5 days/week, as reported by wife   Time 4   Period Weeks   Status New          SLP Long Term Goals - 06/14/15 1657    SLP LONG TERM GOAL #1   Title pt will complete HEP with usual min A from wife or SLP    Time 8   Period Weeks   Status New   SLP LONG TERM GOAL #  2   Title pt will follow swallow precautions with usual min A from wife or SLP   Time 8   Period Weeks   Status New          Plan - 06/14/15 1641    Clinical Impression Statement Pt presents with moderate dysphagia as ID'd by modified barium swallow test at [facility] recommending house diet with nectar liquids and precautions of 90 degrees at meals, double swallow, intermittent cough/throat clear, avoid mixed consistencies, and pt monitored all POs   Speech Therapy Frequency 2x / week   Duration --  8 weeks   Treatment/Interventions Aspiration precaution training;Pharyngeal strengthening exercises;Oral motor exercises;Diet toleration management by SLP;Cueing hierarchy;Compensatory techniques;Environmental controls;SLP instruction and feedback;Patient/family education   Potential to Achieve Goals Fair   Potential Considerations Severity of impairments;Previous level of function;Cooperation/participation level   SLP Home Exercise Plan provided today, SLP strongly stressed pt participation must be present to make positive impact on swallowing abliity   Consulted and Agree with Plan of Care Patient          G-Codes - 2015/06/29 1658    Functional Assessment Tool Used noms   Functional Limitations Swallowing   Swallow Current Status (Z6109) At least 40 percent but less than 60 percent impaired, limited or restricted   Swallow Goal Status (U0454) At least 20 percent but less than 40 percent impaired, limited or restricted      Problem List Patient Active Problem List   Diagnosis Date Noted  . Prolonged Q-T interval on ECG  06/06/2015  . Allergic reaction 06/05/2015  . HCAP (healthcare-associated pneumonia) 06/04/2015  . Encephalopathy, metabolic 06/04/2015  . 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 Cognitive decline 05/07/2015  . Aspiration pneumonia 01/21/2015  . Healed myocardial infarct 08/23/2013  . Obstructive apnea 08/23/2013  . B12 deficiency 02/03/2013  . Idiopathic Parkinson's disease 08/29/2011  . Hyperlipidemia 08/22/2011  . CAD (coronary artery disease) 08/22/2011  . Dementia 08/22/2011  . Atrial fibrillation 01/21/2011  . Cardiac pacemaker in situ 01/21/2011    [facility] , MS, CCC-SLP  06/14/2015, 4:59 PM  [facility] [address] [place], [place], [address] Phone: [phone]   Fax:  [phone]

## 2015-06-18 ENCOUNTER — Ambulatory Visit (INDEPENDENT_AMBULATORY_CARE_PROVIDER_SITE_OTHER): Payer: Medicare Other | Admitting: Family Medicine

## 2015-06-18 ENCOUNTER — Encounter: Payer: Self-pay | Admitting: Family Medicine

## 2015-06-18 VITALS — BP 98/72 | HR 92 | Temp 98.0°F | Resp 17 | Ht 71.0 in | Wt 166.1 lb

## 2015-06-18 DIAGNOSIS — J189 Pneumonia, unspecified organism: Secondary | ICD-10-CM

## 2015-06-18 NOTE — Patient Instructions (Signed)
Follow up as needed No medication changes at this time Keep up the good work!  You look great! Call with any questions or concerns Enjoy the rest of your summer!!!

## 2015-06-18 NOTE — Progress Notes (Signed)
   Subjective:    Patient ID: [name], male    DOB: 1941/10/31, 74 y.o.   MRN: [id number]  HPI Hospital f/u- pt was admitted 7/25 w/ HCAP, d/c'd home on Doxy x5 days.  Pt has completed abx course.  O2 level is good today.  Wife reports that pt is coughing less.  Doing outpt speech therapy.  Nectar thick liquids.  Wife denies fevers or chills recently.  Wife reports that only sxs prior to admission was shaking chills.  Going to the gym twice weekly.  Resumed his normal activity schedule- [employer] and church last week.   Review of Systems For ROS see HPI     Objective:   Physical Exam  Constitutional: He appears well-developed and well-nourished. No distress.  HENT:  Head: Normocephalic and atraumatic.  Neck: Normal range of motion. Neck supple.  Cardiovascular:  Irregularly irregular S1/S2  Pulmonary/Chest: Effort normal and breath sounds normal. No respiratory distress. He has no wheezes. He has no rales.  Musculoskeletal: He exhibits no edema.  Lymphadenopathy:    He has no cervical adenopathy.  Skin: Skin is warm and dry.  Vitals reviewed.         Assessment & Plan:

## 2015-06-18 NOTE — Progress Notes (Signed)
Pre visit review using our clinic review tool, if applicable. No additional management support is needed unless otherwise documented below in the visit note. 

## 2015-06-18 NOTE — Assessment & Plan Note (Addendum)
New dx for pt.  Treated as inpt w/ outpt course of Doxy. During course of pt's visit hospital d/c summary, labs, and imaging were reviewed.  Wife reports cough has improved, no fevers, personality/cognition is baseline.  Completed abx course w/o difficulty.  No med changes or labs at this time.  Reviewed red flags w/ wife that should prompt immediate attention.

## 2015-07-02 ENCOUNTER — Ambulatory Visit (INDEPENDENT_AMBULATORY_CARE_PROVIDER_SITE_OTHER): Payer: Medicare Other | Admitting: *Deleted

## 2015-07-02 ENCOUNTER — Telehealth: Payer: Self-pay | Admitting: Internal Medicine

## 2015-07-02 DIAGNOSIS — I442 Atrioventricular block, complete: Secondary | ICD-10-CM | POA: Diagnosis not present

## 2015-07-02 NOTE — Telephone Encounter (Signed)
Home number sounds like fax when connected.  LM on wife's cell # that transmission was received and to call back if needed.

## 2015-07-02 NOTE — Telephone Encounter (Signed)
New message     Pt wife checking to see if transmission was received this morning Please call to discuss

## 2015-07-02 NOTE — Progress Notes (Signed)
Remote pacemaker transmission.   

## 2015-07-06 LAB — CUP PACEART REMOTE DEVICE CHECK
Battery Impedance: 1584 Ohm
Battery Remaining Longevity: 30 mo
Battery Voltage: 2.75 V
Brady Statistic AP VP Percent: 94 %
Brady Statistic AS VP Percent: 6 %
Date Time Interrogation Session: 20160822111329
Lead Channel Impedance Value: 472 Ohm
Lead Channel Setting Pacing Amplitude: 2.5 V
Lead Channel Setting Pacing Amplitude: 3 V
Lead Channel Setting Pacing Pulse Width: 0.4 ms
Lead Channel Setting Sensing Sensitivity: 2.8 mV
MDC IDC MSMT LEADCHNL RA PACING THRESHOLD AMPLITUDE: 1.5 V
MDC IDC MSMT LEADCHNL RA PACING THRESHOLD PULSEWIDTH: 0.4 ms
MDC IDC MSMT LEADCHNL RV IMPEDANCE VALUE: 506 Ohm
MDC IDC MSMT LEADCHNL RV PACING THRESHOLD AMPLITUDE: 0.75 V
MDC IDC MSMT LEADCHNL RV PACING THRESHOLD PULSEWIDTH: 0.4 ms
MDC IDC STAT BRADY AP VS PERCENT: 0 %
MDC IDC STAT BRADY AS VS PERCENT: 0 %

## 2015-07-12 ENCOUNTER — Ambulatory Visit: Payer: Medicare Other | Attending: Physical Medicine & Rehabilitation | Admitting: Speech Pathology

## 2015-07-12 ENCOUNTER — Encounter: Payer: Self-pay | Admitting: Cardiology

## 2015-07-12 DIAGNOSIS — R1310 Dysphagia, unspecified: Secondary | ICD-10-CM | POA: Diagnosis not present

## 2015-07-12 DIAGNOSIS — R471 Dysarthria and anarthria: Secondary | ICD-10-CM | POA: Insufficient documentation

## 2015-07-12 DIAGNOSIS — R41841 Cognitive communication deficit: Secondary | ICD-10-CM | POA: Diagnosis present

## 2015-07-12 NOTE — Therapy (Signed)
[facility] [address] [place], [place], [address] Phone: [phone]   Fax:  [phone]  Speech Language Pathology Treatment  Patient Details  Name: [name] MRN: [id number] Date of Birth: 28-Apr-1941 Referring Provider:  Sheliah Hatch, MD  Encounter Date: 07/12/2015      End of Session - 07/12/15 1206    Visit Number 2   Number of Visits 16   Date for SLP Re-Evaluation 08/10/15   SLP Start Time 1105   SLP Stop Time  1146   SLP Time Calculation (min) 41 min   Activity Tolerance Patient tolerated treatment well      Past Medical History  Diagnosis Date  . Parkinson disease   . Bradycardia     syncope due to prolonged pauses, resolved with PPM  . Sleep apnea   . Hypertension   . Dermatitis   . Depression   . Rib fractures 2011    S/p fall  . Maxillary fracture 2009    Right  . BPH (benign prostatic hypertrophy)   . Coronary artery disease 1986    Inferior wall MI treated with Angioplasty  . Normal echocardiogram 2007    Mild LVH with Impaired relaxation, Mild-Mod. Aortic Root dilation, Mild Aortic Sclerosis, Mild MR, Mild TR, Ef-50-55%  . Normal nuclear stress test 2006    Normal EF-54%  . CAD (coronary artery disease)   . Inferior MI   . 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 Maxillary fracture 2009    Right side  . Rib fracture 07/2010  . Atrioventricular block, complete   . Pacemaker   . H/O hiatal hernia   . Posterior vitreous detachment 03/04/2012  . Herpetic gingivostomatitis 01/19/2015  . Olecranon bursitis of left elbow 01/19/2015  . Infection and inflammatory reaction due to nervous system device, implant, and graft 05/15/2015    Past Surgical History  Procedure Laterality Date  . Pacemaker insertion  10/28/10    MDT implanted by Dr Ladona Ridgel  . Deep brain stimulator placement      Parkinsons  . Arthroscopic knee      Left knee  . Cataract surgery      Bilateral  .  Inguinal hernia repair      Bilateral  . Cardiac catheterization      Multiple Angioplasties, last 2002  . Removal of ganglion cyst      right wrist  . Insertion / placement / revision neurostimulator    . Eye surgery    . Insert / replace / remove pacemaker      There were no vitals filed for this visit.  Visit Diagnosis: Dysphagia      Subjective Assessment - 07/12/15 1159    Subjective "He is distracted today - some days he does better"               ADULT SLP TREATMENT - 07/12/15 1159    General Information   Behavior/Cognition Alert;Cooperative;Pleasant mood;Distractible   Treatment Provided   Treatment provided Dysphagia   Dysphagia Treatment   Temperature Spikes Noted No   Patient observed directly with PO's Yes   Type of PO's observed Ice chips   Type of cueing Verbal;Visual   Amount of cueing Maximal   Other treatment/comments Pt required max A to perform dysphagia HEP. Paid caregiver/sitter present with wife for training on HEP cueing and feed back. Utilized small ice chip to elicit hard swallow. Trained wife and sitter to use spoon  for tongue press as well as tongue protrusion as this is more concrete and observable. Wife verbalize diet modifications and swallow precautions with min questioing cues.    Pain Assessment   Pain Assessment No/denies pain   Assessment / Recommendations / Plan   Plan Continue with current plan of care   Progression Toward Goals   Progression toward goals Progressing toward goals          SLP Education - 07/12/15 1203    Education provided Yes   Education Details HEP, rationale for HEP and diet modification    Person(s) Educated Patient;Spouse;Caregiver(s)   Methods Explanation;Demonstration;Verbal cues   Comprehension Returned demonstration;Verbalized understanding;Verbal cues required;Need further instruction          SLP Short Term Goals - 07/12/15 1205    SLP SHORT TERM GOAL #1   Title pt will complete HEP with  consistent min A from wife or SLP   Time 3   Period Weeks   Status On-going   SLP SHORT TERM GOAL #2   Title pt will follow swallow precautions with consistent min A from wife or SLP with POs   Time 3   Period Weeks   Status On-going   SLP SHORT TERM GOAL #3   Title pt will complete HEP at least 5 days/week, as reported by wife   Time 3   Period Weeks   Status On-going          SLP Long Term Goals - 07/12/15 1206    SLP LONG TERM GOAL #1   Title pt will complete HEP with usual min A from wife or SLP    Time 7   Period Weeks   Status On-going   SLP LONG TERM GOAL #2   Title pt will follow swallow precautions with usual min A from wife or SLP   Time 7   Period Weeks   Status On-going          Plan - 07/12/15 1204    Clinical Impression Statement Spouse and caregiver report reduced coughing with PO since performing HEP. Continue skilled ST to maximize carryover of HEP and swallow precautions.    Speech Therapy Frequency 2x / week   Treatment/Interventions Aspiration precaution training;Pharyngeal strengthening exercises;Oral motor exercises;Diet toleration management by SLP;Cueing hierarchy;Compensatory techniques;Environmental controls;SLP instruction and feedback;Patient/family education   Potential to Achieve Goals Fair   Potential Considerations Severity of impairments;Previous level of function;Cooperation/participation level   SLP Home Exercise Plan provided today, SLP strongly stressed pt participation must be present to make positive impact on swallowing abliity   Consulted and Agree with Plan of Care Patient        Problem List Patient Active Problem List   Diagnosis Date Noted  . Prolonged Q-T interval on ECG 06/06/2015  . Allergic reaction 06/05/2015  . HCAP (healthcare-associated pneumonia) 06/04/2015  . Encephalopathy, metabolic 06/04/2015  . 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 Cognitive decline 05/07/2015  . Aspiration  pneumonia 01/21/2015  . Healed myocardial infarct 08/23/2013  . Obstructive apnea 08/23/2013  . B12 deficiency 02/03/2013  . Idiopathic Parkinson's disease 08/29/2011  . Hyperlipidemia 08/22/2011  . CAD (coronary artery disease) 08/22/2011  . Dementia 08/22/2011  . Atrial fibrillation 01/21/2011  . Cardiac pacemaker in situ 01/21/2011    Lovvorn, Radene Journey MS, CCC-SLP 07/12/2015, 12:07 PM  [facility] [address] [place], [place], [address] Phone: [phone]   Fax:  [phone]

## 2015-07-17 ENCOUNTER — Ambulatory Visit: Payer: Medicare Other

## 2015-07-17 DIAGNOSIS — R1310 Dysphagia, unspecified: Secondary | ICD-10-CM

## 2015-07-17 DIAGNOSIS — R41841 Cognitive communication deficit: Secondary | ICD-10-CM

## 2015-07-17 DIAGNOSIS — R471 Dysarthria and anarthria: Secondary | ICD-10-CM

## 2015-07-17 NOTE — Therapy (Signed)
[facility] [address] [place], [place], [address] Phone: [phone]   Fax:  [phone]  Speech Language Pathology Treatment  Patient Details  Name: [name] MRN: [id number] Date of Birth: 1941/01/12 Referring Provider:  Sheliah Hatch, MD  Encounter Date: 07/17/2015      End of Session - 07/17/15 1615    Visit Number 3   Number of Visits 16   Date for SLP Re-Evaluation 08/10/15   SLP Start Time 1446   SLP Stop Time  1521   SLP Time Calculation (min) 35 min   Activity Tolerance --      Past Medical History  Diagnosis Date  . Parkinson disease   . Bradycardia     syncope due to prolonged pauses, resolved with PPM  . Sleep apnea   . Hypertension   . Dermatitis   . Depression   . Rib fractures 2011    S/p fall  . Maxillary fracture 2009    Right  . BPH (benign prostatic hypertrophy)   . Coronary artery disease 1986    Inferior wall MI treated with Angioplasty  . Normal echocardiogram 2007    Mild LVH with Impaired relaxation, Mild-Mod. Aortic Root dilation, Mild Aortic Sclerosis, Mild MR, Mild TR, Ef-50-55%  . Normal nuclear stress test 2006    Normal EF-54%  . CAD (coronary artery disease)   . Inferior MI   . 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 Maxillary fracture 2009    Right side  . Rib fracture 07/2010  . Atrioventricular block, complete   . Pacemaker   . H/O hiatal hernia   . Posterior vitreous detachment 03/04/2012  . Herpetic gingivostomatitis 01/19/2015  . Olecranon bursitis of left elbow 01/19/2015  . Infection and inflammatory reaction due to nervous system device, implant, and graft 05/15/2015    Past Surgical History  Procedure Laterality Date  . Pacemaker insertion  10/28/10    MDT implanted by Dr Ladona Ridgel  . Deep brain stimulator placement      Parkinsons  . Arthroscopic knee      Left knee  . Cataract surgery      Bilateral  . Inguinal hernia repair     Bilateral  . Cardiac catheterization      Multiple Angioplasties, last 2002  . Removal of ganglion cyst      right wrist  . Insertion / placement / revision neurostimulator    . Eye surgery    . Insert / replace / remove pacemaker      There were no vitals filed for this visit.  Visit Diagnosis: Dysphagia  Cognitive communication deficit  Dysarthria      Subjective Assessment - 07/17/15 1451    Subjective Pt getting in exercises once-twice a day.   Patient is accompained by: Family member  wife               ADULT SLP TREATMENT - 07/17/15 1452    General Information   Behavior/Cognition Cooperative;Pleasant mood;Distractible   Treatment Provided   Treatment provided Dysphagia   Dysphagia Treatment   Temperature Spikes Noted No   Treatment Methods Therapeutic exercise;Compensation strategy training;Patient/caregiver education   Type of cueing Verbal;Visual   Amount of cueing Moderate  occasionally   Other treatment/comments Pt required mod A occasionally to complete HEP with correct procedure. Wife was WNL in her descriptoin/assistance with pt for HEP. Pt told SLP "small bites" for his preautions. Wife told SLP all  precautions wihtout A. Wife is assisting pt at home with HEP and with precautions. She tells SLP that pt completes one set of 10 at home, approx once-twice per day. SLP increased reps to 15 for each instead of 10.    Pain Assessment   Pain Assessment No/denies pain   Assessment / Recommendations / Plan   Plan Continue with current plan of care   Progression Toward Goals   Progression toward goals Progressing toward goals  requires wife assistance at home          SLP Education - 07/17/15 1615    Education provided Yes   Education Details HEP for pt (wife independent)   Person(s) Educated Patient   Methods Explanation;Demonstration;Verbal cues   Comprehension Verbalized understanding;Returned demonstration;Verbal cues required          SLP  Short Term Goals - 07/17/15 1628    SLP SHORT TERM GOAL #1   Title pt will complete HEP with consistent min A from wife or SLP   Time 2   Status On-going   SLP SHORT TERM GOAL #2   Title pt will follow swallow precautions with consistent min A from wife or SLP with POs   Time 2   Period Weeks   Status On-going   SLP SHORT TERM GOAL #3   Title pt will complete HEP at least 5 days/week, as reported by wife   Status Achieved          SLP Long Term Goals - 07/17/15 1629    SLP LONG TERM GOAL #1   Title pt will complete HEP with usual min A from wife or SLP    Time 6   Period Weeks   Status On-going   SLP LONG TERM GOAL #2   Title pt will follow swallow precautions with usual min A from wife or SLP   Time 6   Period Weeks   Status On-going          Problem List Patient Active Problem List   Diagnosis Date Noted  . Prolonged Q-T interval on ECG 06/06/2015  . Allergic reaction 06/05/2015  . HCAP (healthcare-associated pneumonia) 06/04/2015  . Encephalopathy, metabolic 06/04/2015  . 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 Cognitive decline 05/07/2015  . Aspiration pneumonia 01/21/2015  . Healed myocardial infarct 08/23/2013  . Obstructive apnea 08/23/2013  . B12 deficiency 02/03/2013  . Idiopathic Parkinson's disease 08/29/2011  . Hyperlipidemia 08/22/2011  . CAD (coronary artery disease) 08/22/2011  . Dementia 08/22/2011  . Atrial fibrillation 01/21/2011  . Cardiac pacemaker in situ 01/21/2011    [facility] , MS, CCC-SLP  07/17/2015, 4:30 PM   [facility] [address] [place], [place], [address] Phone: [phone]   Fax:  [phone]

## 2015-07-17 NOTE — Patient Instructions (Addendum)
I increased your number of reps to 15 from 10.  ========================================================== PRECAUTIONS  double swallow  intermittent cough/throat clear   avoid mixed consistencies   ===================== pt be monitored with all meals/snacks to cue when needed

## 2015-07-19 ENCOUNTER — Ambulatory Visit: Payer: Medicare Other

## 2015-07-19 DIAGNOSIS — R41841 Cognitive communication deficit: Secondary | ICD-10-CM

## 2015-07-19 DIAGNOSIS — R1310 Dysphagia, unspecified: Secondary | ICD-10-CM | POA: Diagnosis not present

## 2015-07-19 DIAGNOSIS — R471 Dysarthria and anarthria: Secondary | ICD-10-CM

## 2015-07-19 NOTE — Therapy (Signed)
[facility] [address] [place], [place], [address] Phone: [phone]   Fax:  [phone]  Speech Language Pathology Treatment  Patient Details  Name: [name] MRN: [id number] Date of Birth: 1941-04-14 Referring Provider:  Sheliah Hatch, MD  Encounter Date: 07/19/2015      End of Session - 07/19/15 1646    Visit Number 4   Number of Visits 16   Date for SLP Re-Evaluation 08/10/15   SLP Start Time 1532   SLP Stop Time  1608   SLP Time Calculation (min) 36 min   Activity Tolerance Patient tolerated treatment well      Past Medical History  Diagnosis Date  . Parkinson disease   . Bradycardia     syncope due to prolonged pauses, resolved with PPM  . Sleep apnea   . Hypertension   . Dermatitis   . Depression   . Rib fractures 2011    S/p fall  . Maxillary fracture 2009    Right  . BPH (benign prostatic hypertrophy)   . Coronary artery disease 1986    Inferior wall MI treated with Angioplasty  . Normal echocardiogram 2007    Mild LVH with Impaired relaxation, Mild-Mod. Aortic Root dilation, Mild Aortic Sclerosis, Mild MR, Mild TR, Ef-50-55%  . Normal nuclear stress test 2006    Normal EF-54%  . CAD (coronary artery disease)   . Inferior MI   . 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 Maxillary fracture 2009    Right side  . Rib fracture 07/2010  . Atrioventricular block, complete   . Pacemaker   . H/O hiatal hernia   . Posterior vitreous detachment 03/04/2012  . Herpetic gingivostomatitis 01/19/2015  . Olecranon bursitis of left elbow 01/19/2015  . Infection and inflammatory reaction due to nervous system device, implant, and graft 05/15/2015    Past Surgical History  Procedure Laterality Date  . Pacemaker insertion  10/28/10    MDT implanted by Dr Ladona Ridgel  . Deep brain stimulator placement      Parkinsons  . Arthroscopic knee      Left knee  . Cataract surgery      Bilateral  .  Inguinal hernia repair      Bilateral  . Cardiac catheterization      Multiple Angioplasties, last 2002  . Removal of ganglion cyst      right wrist  . Insertion / placement / revision neurostimulator    . Eye surgery    . Insert / replace / remove pacemaker      There were no vitals filed for this visit.  Visit Diagnosis: Dysphagia  Cognitive communication deficit  Dysarthria      Subjective Assessment - 07/19/15 1531    Subjective Pt in restroom at 1531.   Patient is accompained by: --  wife               ADULT SLP TREATMENT - 07/19/15 1631    General Information   Behavior/Cognition Cooperative;Pleasant mood;Distractible   Treatment Provided   Treatment provided Dysphagia   Dysphagia Treatment   Temperature Spikes Noted No   Treatment Methods Patient/caregiver education;Skilled observation;Therapeutic exercise   Type of cueing Verbal;Visual   Amount of cueing Moderate   Other treatment/comments SLP addressed pt's swallowing HEP with pt and wife in attendance. Pt required consistent SLP mod A for proper completion of HEP. Wife states pt gets agitated when she helps him so pt mostly  does HEP with caregiver Elnita Maxwell. SLP asked wife if she could schedule Elnita Maxwell to arrive with pt/wife one-two visits, so SLP can observe pt complete HEP with caregiver and SLP to educate caregiver PRN on procedure. SLP used a spoon today on the patient's hard palate for tongue press exercise, and used fingers up and down to count reps of exercises, as well as simple one-two word cues ("Strong sound" for /k^/ and /g^/) for more concrete commands as pt with decr'd immediate/short term memory.    Pain Assessment   Pain Assessment No/denies pain   Assessment / Recommendations / Plan   Plan Continue with current plan of care   Progression Toward Goals   Progression toward goals Progressing toward goals  wife or caregiver assists          SLP Education - 07/19/15 1645    Education  provided Yes   Education Details precautions with POs   Person(s) Educated Patient;Spouse   Methods Explanation;Handout   Comprehension Verbalized understanding;Need further instruction          SLP Short Term Goals - 07/19/15 1652    SLP SHORT TERM GOAL #1   Title pt will complete HEP with consistent min A from wife or SLP   Time 2   Status On-going   SLP SHORT TERM GOAL #2   Title pt will follow swallow precautions with consistent min A from wife or SLP with POs   Time 2   Period Weeks   Status On-going   SLP SHORT TERM GOAL #3   Title pt will complete HEP at least 5 days/week, as reported by wife   Status Achieved          SLP Long Term Goals - 07/17/15 1629    SLP LONG TERM GOAL #1   Title pt will complete HEP with usual min A from wife or SLP    Time 6   Period Weeks   Status On-going   SLP LONG TERM GOAL #2   Title pt will follow swallow precautions with usual min A from wife or SLP   Time 6   Period Weeks   Status On-going          Plan - 07/19/15 1647    Clinical Impression Statement Pt cont to require SLP/caregiver assist for proper procedure for HEP. Pt was observed attempting to write with the spoon used for tongue press, without emergent awareness. Continue skilled ST to maximize carryover of HEP and swallow precautions. Pt appropriate to decr to x1/week due to success with HEP with assistance.   Speech Therapy Frequency 2x / week   Duration 1 week   Treatment/Interventions Aspiration precaution training;Pharyngeal strengthening exercises;Oral motor exercises;Diet toleration management by SLP;Cueing hierarchy;Compensatory techniques;Environmental controls;SLP instruction and feedback;Patient/family education   Potential to Achieve Goals Fair   Potential Considerations Severity of impairments;Previous level of function;Cooperation/participation level        Problem List Patient Active Problem List   Diagnosis Date Noted  . Prolonged Q-T interval  on ECG 06/06/2015  . Allergic reaction 06/05/2015  . HCAP (healthcare-associated pneumonia) 06/04/2015  . Encephalopathy, metabolic 06/04/2015  . 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 Cognitive decline 05/07/2015  . Aspiration pneumonia 01/21/2015  . Healed myocardial infarct 08/23/2013  . Obstructive apnea 08/23/2013  . B12 deficiency 02/03/2013  . Idiopathic Parkinson's disease 08/29/2011  . Hyperlipidemia 08/22/2011  . CAD (coronary artery disease) 08/22/2011  . Dementia 08/22/2011  . Atrial fibrillation 01/21/2011  . Cardiac  pacemaker in situ 01/21/2011    [facility] , MS, CCC-SLP  07/19/2015, 4:53 PM  [facility] [address] [place], [place], [address] Phone: [phone]   Fax:  [phone]

## 2015-07-24 ENCOUNTER — Ambulatory Visit: Payer: Medicare Other

## 2015-07-24 DIAGNOSIS — R471 Dysarthria and anarthria: Secondary | ICD-10-CM

## 2015-07-24 DIAGNOSIS — R1310 Dysphagia, unspecified: Secondary | ICD-10-CM

## 2015-07-24 DIAGNOSIS — R41841 Cognitive communication deficit: Secondary | ICD-10-CM

## 2015-07-24 NOTE — Patient Instructions (Signed)
Make sure to check with MD/his RN re: whether carbidopa-levadopa can be taken with food.

## 2015-07-24 NOTE — Therapy (Signed)
[facility] [address] [place], [place], [address] Phone: [phone]   Fax:  [phone]  Speech Language Pathology Treatment  Patient Details  Name: [name] MRN: [id number] Date of Birth: April 26, 1941 Referring Provider:  Sheliah Hatch, MD  Encounter Date: 07/24/2015      End of Session - 07/24/15 1651    Visit Number 5   Number of Visits 16   Date for SLP Re-Evaluation 08/10/15   SLP Start Time 1534   SLP Stop Time  1614   SLP Time Calculation (min) 40 min   Activity Tolerance Patient tolerated treatment well      Past Medical History  Diagnosis Date  . Parkinson disease   . Bradycardia     syncope due to prolonged pauses, resolved with PPM  . Sleep apnea   . Hypertension   . Dermatitis   . Depression   . Rib fractures 2011    S/p fall  . Maxillary fracture 2009    Right  . BPH (benign prostatic hypertrophy)   . Coronary artery disease 1986    Inferior wall MI treated with Angioplasty  . Normal echocardiogram 2007    Mild LVH with Impaired relaxation, Mild-Mod. Aortic Root dilation, Mild Aortic Sclerosis, Mild MR, Mild TR, Ef-50-55%  . Normal nuclear stress test 2006    Normal EF-54%  . CAD (coronary artery disease)   . Inferior MI   . 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 Maxillary fracture 2009    Right side  . Rib fracture 07/2010  . Atrioventricular block, complete   . Pacemaker   . H/O hiatal hernia   . Posterior vitreous detachment 03/04/2012  . Herpetic gingivostomatitis 01/19/2015  . Olecranon bursitis of left elbow 01/19/2015  . Infection and inflammatory reaction due to nervous system device, implant, and graft 05/15/2015    Past Surgical History  Procedure Laterality Date  . Pacemaker insertion  10/28/10    MDT implanted by Dr Ladona Ridgel  . Deep brain stimulator placement      Parkinsons  . Arthroscopic knee      Left knee  . Cataract surgery      Bilateral  .  Inguinal hernia repair      Bilateral  . Cardiac catheterization      Multiple Angioplasties, last 2002  . Removal of ganglion cyst      right wrist  . Insertion / placement / revision neurostimulator    . Eye surgery    . Insert / replace / remove pacemaker      There were no vitals filed for this visit.  Visit Diagnosis: Dysphagia  Cognitive communication deficit  Dysarthria      Subjective Assessment - 07/24/15 1540    Subjective Pt with "bad night" last night, slept until 10:30 this morning.   Patient is accompained by: Family member  wife               ADULT SLP TREATMENT - 07/24/15 1541    General Information   Behavior/Cognition Cooperative;Pleasant mood;Distractible   Treatment Provided   Treatment provided Dysphagia   Dysphagia Treatment   Temperature Spikes Noted No   Treatment Methods Patient/caregiver education;Skilled observation;Therapeutic exercise   Patient observed directly with PO's Yes   Type of cueing Verbal;Visual   Amount of cueing Moderate   Other treatment/comments SLP assessed pt's ability to follow precuations of intermittent throat clear, double swallows and other precautions with POs. Pt  req'd usual mod cues. SLP stressed to pt rationale for each precaution. Pt unable to tell SLP reasons for precautions after 5 minutes delay. given appropriate time. Wife is known to A pt at mealtimes. SLP encouraged wife to check with MD/pharmacist regarding whether or not to take Sinemet with or without food.   Pain Assessment   Pain Assessment No/denies pain   Assessment / Recommendations / Plan   Plan Continue with current plan of care   Progression Toward Goals   Progression toward goals Progressing toward goals          SLP Education - 07/24/15 1650    Education provided Yes   Education Details precautions, rationale for precautions   Person(s) Educated Patient;Spouse   Methods Explanation;Demonstration;Verbal cues  visual cues    Comprehension Verbalized understanding;Returned demonstration;Verbal cues required;Need further instruction          SLP Short Term Goals - 07/24/15 1656    SLP SHORT TERM GOAL #1   Title pt will complete HEP with consistent min A from wife or SLP   Time 1   Status On-going   SLP SHORT TERM GOAL #2   Title pt will follow swallow precautions with consistent min A from wife or SLP with POs   Status Achieved   SLP SHORT TERM GOAL #3   Title pt will complete HEP at least 5 days/week, as reported by wife   Status Achieved          SLP Long Term Goals - 07/24/15 1657    SLP LONG TERM GOAL #1   Title pt will complete HEP with usual min A from wife or SLP    Time 5   Period Weeks   Status On-going   SLP LONG TERM GOAL #2   Title pt will follow swallow precautions with usual min A from wife or SLP   Time 5   Period Weeks   Status On-going          Plan - 07/24/15 1656    Clinical Impression Statement Pt requires SLP/caregiver assist for proper use of swallow strategies. Continue skilled ST to maximize carryover of HEP and swallow precautions. [name] to check with MD/RN if PD meds can be taken with food.   Speech Therapy Frequency 1x /week   Duration --  5 weeks   Treatment/Interventions Aspiration precaution training;Pharyngeal strengthening exercises;Oral motor exercises;Diet toleration management by SLP;Cueing hierarchy;Compensatory techniques;Environmental controls;SLP instruction and feedback;Patient/family education   Potential to Achieve Goals Fair   Potential Considerations Severity of impairments;Previous level of function;Cooperation/participation level        Problem List Patient Active Problem List   Diagnosis Date Noted  . Prolonged Q-T interval on ECG 06/06/2015  . Allergic reaction 06/05/2015  . HCAP (healthcare-associated pneumonia) 06/04/2015  . Encephalopathy, metabolic 06/04/2015  . 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  Cognitive decline 05/07/2015  . Aspiration pneumonia 01/21/2015  . Healed myocardial infarct 08/23/2013  . Obstructive apnea 08/23/2013  . B12 deficiency 02/03/2013  . Idiopathic Parkinson's disease 08/29/2011  . Hyperlipidemia 08/22/2011  . CAD (coronary artery disease) 08/22/2011  . Dementia 08/22/2011  . Atrial fibrillation 01/21/2011  . Cardiac pacemaker in situ 01/21/2011    [facility] , MS, CCC-SLP  07/24/2015, 4:58 PM  [facility] [address] [place], [place], [address] Phone: [phone]   Fax:  [phone]

## 2015-07-26 ENCOUNTER — Encounter: Payer: Self-pay | Admitting: Internal Medicine

## 2015-07-26 ENCOUNTER — Ambulatory Visit: Payer: Medicare Other

## 2015-07-29 ENCOUNTER — Emergency Department (HOSPITAL_COMMUNITY)
Admission: EM | Admit: 2015-07-29 | Discharge: 2015-07-29 | Disposition: A | Discharge status: Home / Self Care | Payer: Medicare Other | Attending: Emergency Medicine | Admitting: Emergency Medicine

## 2015-07-29 ENCOUNTER — Encounter (HOSPITAL_COMMUNITY): Payer: Self-pay

## 2015-07-29 ENCOUNTER — Emergency Department (HOSPITAL_COMMUNITY): Payer: Medicare Other

## 2015-07-29 DIAGNOSIS — Z872 Personal history of diseases of the skin and subcutaneous tissue: Secondary | ICD-10-CM | POA: Insufficient documentation

## 2015-07-29 DIAGNOSIS — Z9889 Other specified postprocedural states: Secondary | ICD-10-CM | POA: Insufficient documentation

## 2015-07-29 DIAGNOSIS — Z8619 Personal history of other infectious and parasitic diseases: Secondary | ICD-10-CM | POA: Insufficient documentation

## 2015-07-29 DIAGNOSIS — Z8781 Personal history of (healed) traumatic fracture: Secondary | ICD-10-CM | POA: Diagnosis not present

## 2015-07-29 DIAGNOSIS — Z7982 Long term (current) use of aspirin: Secondary | ICD-10-CM | POA: Diagnosis not present

## 2015-07-29 DIAGNOSIS — I10 Essential (primary) hypertension: Secondary | ICD-10-CM | POA: Insufficient documentation

## 2015-07-29 DIAGNOSIS — R05 Cough: Secondary | ICD-10-CM | POA: Insufficient documentation

## 2015-07-29 DIAGNOSIS — N400 Enlarged prostate without lower urinary tract symptoms: Secondary | ICD-10-CM | POA: Insufficient documentation

## 2015-07-29 DIAGNOSIS — I2510 Atherosclerotic heart disease of native coronary artery without angina pectoris: Secondary | ICD-10-CM | POA: Diagnosis not present

## 2015-07-29 DIAGNOSIS — Z8719 Personal history of other diseases of the digestive system: Secondary | ICD-10-CM | POA: Insufficient documentation

## 2015-07-29 DIAGNOSIS — F329 Major depressive disorder, single episode, unspecified: Secondary | ICD-10-CM | POA: Insufficient documentation

## 2015-07-29 DIAGNOSIS — I252 Old myocardial infarction: Secondary | ICD-10-CM | POA: Insufficient documentation

## 2015-07-29 DIAGNOSIS — Z950 Presence of cardiac pacemaker: Secondary | ICD-10-CM | POA: Diagnosis not present

## 2015-07-29 DIAGNOSIS — Z79899 Other long term (current) drug therapy: Secondary | ICD-10-CM | POA: Insufficient documentation

## 2015-07-29 DIAGNOSIS — Z8669 Personal history of other diseases of the nervous system and sense organs: Secondary | ICD-10-CM | POA: Insufficient documentation

## 2015-07-29 DIAGNOSIS — R059 Cough, unspecified: Secondary | ICD-10-CM

## 2015-07-29 HISTORY — DX: Dysphagia, unspecified: R13.10

## 2015-07-29 MED ORDER — DEXTROMETHORPHAN HBR 10 MG/15ML PO SYRP: 10.0000 mg | ORAL_SOLUTION | Freq: Two times a day (BID) | ORAL | Status: DC | PRN

## 2015-07-29 NOTE — ED Notes (Signed)
Patient transported to X-ray 

## 2015-07-29 NOTE — ED Provider Notes (Signed)
CSN: [id number]     Arrival date & time 07/29/15  4825 History   First MD Initiated Contact with Patient 07/29/15 1152     Chief Complaint  Patient presents with  . Cough    HPI [name] is a 74 y.o. male with psat medical history significant for Parkinson disease, HTN, dysphagia, and CAD, who presents to the ED with one day of cough. Wife states that patient usually has a chronic intermittent cough but this morning he woke up with a different sounding cough than usual. She was worried because he had pneumonia before which they thought might have been from aspiration. Wife states cough sounds loose and ratiley. Denies phlegm production, change in mental status, fevers, recent illness, and lung disease.  Past Medical History  Diagnosis Date  . Parkinson disease   . Bradycardia     syncope due to prolonged pauses, resolved with PPM  . Sleep apnea   . Hypertension   . Dermatitis   . Depression   . Rib fractures 2011    S/p fall  . Maxillary fracture 2009    Right  . BPH (benign prostatic hypertrophy)   . Coronary artery disease 1986    Inferior wall MI treated with Angioplasty  . Normal echocardiogram 2007    Mild LVH with Impaired relaxation, Mild-Mod. Aortic Root dilation, Mild Aortic Sclerosis, Mild MR, Mild TR, Ef-50-55%  . Normal nuclear stress test 2006    Normal EF-54%  . CAD (coronary artery disease)   . Inferior MI   . 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 Maxillary fracture 2009    Right side  . Rib fracture 07/2010  . Atrioventricular block, complete   . Pacemaker   . H/O hiatal hernia   . Posterior vitreous detachment 03/04/2012  . Herpetic gingivostomatitis 01/19/2015  . Olecranon bursitis of left elbow 01/19/2015  . Infection and inflammatory reaction due to nervous system device, implant, and graft 05/15/2015  . Dysphagia    Past Surgical History  Procedure Laterality Date  . Pacemaker insertion  10/28/10    MDT implanted by Dr Ladona Ridgel  . Deep brain  stimulator placement      Parkinsons  . Arthroscopic knee      Left knee  . Cataract surgery      Bilateral  . Inguinal hernia repair      Bilateral  . Cardiac catheterization      Multiple Angioplasties, last 2002  . Removal of ganglion cyst      right wrist  . Insertion / placement / revision neurostimulator    . Eye surgery    . Insert / replace / remove pacemaker     Family History  Problem Relation Age of Onset  . Heart attack Mother   . Heart disease Mother   . Hypertension Mother   . Heart attack Father   . Other Father     renal calculi  . Heart disease Brother   . Heart attack Sister    Social History  Substance Use Topics  . Smoking status: Never Smoker   . Smokeless tobacco: None  . Alcohol Use: No    Review of Systems  Constitutional: Negative for fever.  Respiratory: Positive for cough. Negative for choking, shortness of breath and wheezing.   Cardiovascular: Negative for chest pain.  Gastrointestinal: Negative for abdominal pain.  Psychiatric/Behavioral: Negative for confusion.  Also per HPI  Allergies  Fortaz; Crestor; Lipitor; Rosuvastatin; Bactrim; Gluten; and Vancomycin  Home Medications   Prior to Admission medications   Medication Sig Start Date End Date Taking? Authorizing Provider  aspirin 81 MG tablet Take 81 mg by mouth daily.   Yes Historical Provider, MD  Calcium Carb-Cholecalciferol (CALCIUM-VITAMIN D) 600-400 MG-UNIT TABS Take 1 tablet by mouth 2 (two) times daily.    Yes Historical Provider, MD  carbidopa-levodopa (SINEMET IR) 25-250 MG per tablet Take 1 tablet by mouth 4 (four) times daily.   Yes Historical Provider, MD  Carbidopa-Levodopa ER (SINEMET CR) 25-100 MG tablet controlled release Take 1 tablet by mouth at bedtime.   Yes Historical Provider, MD  dapsone 25 MG tablet Take 50 mg by mouth every morning.    Yes Historical Provider, MD  docusate sodium (COLACE) 100 MG capsule Take 1 capsule (100 mg total) by mouth 2 (two) times  daily as needed for mild constipation. 01/17/15  Yes Ripudeep K Rai, MD  donepezil (ARICEPT) 10 MG tablet Take 10 mg by mouth daily.     Yes Historical Provider, MD  escitalopram (LEXAPRO) 10 MG tablet Take 1 tablet (10 mg total) by mouth daily. 05/24/14  Yes Daniel J Angiulli, PA-C  Magnesium 400 MG CAPS Take 400 mg by mouth daily. 05/24/14  Yes Daniel J Angiulli, PA-C  mirabegron ER (MYRBETRIQ) 50 MG TB24 tablet Take 1 tablet (50 mg total) by mouth daily. 05/24/14  Yes Daniel J Angiulli, PA-C  nitroGLYCERIN (NITROSTAT) 0.4 MG SL tablet Place 0.4 mg under the tongue every 5 (five) minutes as needed for chest pain.   Yes Historical Provider, MD  QUEtiapine (SEROQUEL) 25 MG tablet Take 25 mg by mouth at bedtime.   Yes Historical Provider, MD  tamsulosin (FLOMAX) 0.4 MG CAPS capsule Take 1 capsule (0.4 mg total) by mouth daily. 05/24/14  Yes Daniel J Angiulli, PA-C  vitamin B-12 (CYANOCOBALAMIN) 1000 MCG tablet Take 1 tablet (1,000 mcg total) by mouth daily. 05/24/14  Yes Daniel J Angiulli, PA-C  vitamin C (ASCORBIC ACID) 500 MG tablet Take 500 mg by mouth 2 (two) times daily.   Yes Historical Provider, MD  ZETIA 10 MG tablet TAKE 1 TABLET BY MOUTH EVERY DAY Patient taking differently: TAKE 10 MG BY MOUTH DAILY. 04/12/15  Yes Marinus Maw, MD  zinc gluconate 50 MG tablet Take 50 mg by mouth daily.   Yes Historical Provider, MD  Dextromethorphan HBr 10 MG/15ML SYRP Take 15 mLs (10 mg total) by mouth 2 (two) times daily as needed. 07/29/15   Jazma Orbie Pyo, DO   BP 132/80 mmHg  Pulse 78  Temp(Src) 97.5 F (36.4 C) (Oral)  Resp 12  SpO2 100% Physical Exam  Constitutional: He appears well-developed and well-nourished. No distress.  HENT:  Head: Normocephalic and atraumatic.  Eyes: EOM are normal.  Cardiovascular: Normal rate, regular rhythm and intact distal pulses.   Pulmonary/Chest: Effort normal and breath sounds normal. He has no wheezes. He has no rales.  Pacemaker on upper right chest wall.    Abdominal: Soft. Bowel sounds are normal. There is no tenderness.  Stimulator located in right abdomen  Neurological: He is alert.  Psychiatric: He has a normal mood and affect. His behavior is normal.    ED Course  Procedures (including critical care time) Labs Review Labs Reviewed - No data to display  Imaging Review Dg Chest 2 View  07/29/2015   CLINICAL DATA:  Cough and congestion today.  EXAM: CHEST  2 VIEW  COMPARISON:  06/04/2015.  FINDINGS: The heart is mildly enlarged  but stable. There are chronic lung changes but no acute pulmonary findings. The bony thorax is intact.  IMPRESSION: No acute cardiopulmonary findings.   Electronically Signed   By: Rudie Meyer M.D.   On: 07/29/2015 12:27   I have personally reviewed and evaluated these images and lab results as part of my medical decision-making.   EKG Interpretation None      MDM   Final diagnoses:  Cough   Patient with new cough symptoms of one day duration. Afebrile and vitals are stable. No signs of respiratory stress or infection. CXR with no acute process. No cough was appreciated on my exam. Lung fields clear.   Discussed findings with patient and wife. They are to follow-up with PCP as outpatient to see if any further work-up is needed. Rx for dextromethorphan given.    Caryl Ada, DO 07/29/2015, 11:54 AM PGY-2, [facility]    Pincus Large, DO 07/29/15 2011  Nelva Nay, MD 07/30/15 [phone]

## 2015-07-29 NOTE — ED Notes (Signed)
Pt and spouse reported nonproductive congested cough at times. Resp even and unlabored. Auscultated clear breath sounds bil and throughout. ABC's intact.  Family at bedside.

## 2015-07-29 NOTE — ED Notes (Signed)
Patient  Has a history of dysphagia, and is on nectar thick liquids. Patient is currently seeing a therpist to learn how to swallow correctly. Patient developed a cough this AM. Patient's wife denies any fever. Patient has a history of pneumonia.

## 2015-07-29 NOTE — Discharge Instructions (Signed)
Imaging in ED was negative for any lung process.  Please follow-up with PCP to see if they want to do any further testing Cough syrup prescribed Please return if fever, worsening cough, shortness of breath, altered mental status   Cough, Adult  A cough is a reflex. It helps you clear your throat and airways. A cough can help heal your body. A cough can last 2 or 3 weeks (acute) or may last more than 8 weeks (chronic). Some common causes of a cough can include an infection, allergy, or a cold. HOME CARE  Only take medicine as told by your doctor.  If given, take your medicines (antibiotics) as told. Finish them even if you start to feel better.  Use a cold steam vaporizer or humidifier in your home. This can help loosen thick spit (secretions).  Sleep so you are almost sitting up (semi-upright). Use pillows to do this. This helps reduce coughing.  Rest as needed.  Stop smoking if you smoke. GET HELP RIGHT AWAY IF:  You have yellowish-white fluid (pus) in your thick spit.  Your cough gets worse.  Your medicine does not reduce coughing, and you are losing sleep.  You cough up blood.  You have trouble breathing.  Your pain gets worse and medicine does not help.  You have a fever. MAKE SURE YOU:   Understand these instructions.  Will watch your condition.  Will get help right away if you are not doing well or get worse. Document Released: 07/10/2011 Document Revised: 03/13/2014 Document Reviewed: 07/10/2011 [facility] Patient Information 2015 [place], [place]. This information is not intended to replace advice given to you by your health care provider. Make sure you discuss any questions you have with your health care provider.

## 2015-07-29 NOTE — ED Notes (Signed)
Pt discharged with wife who verbalized understanding of discharge orders.

## 2015-07-30 ENCOUNTER — Telehealth: Payer: Self-pay | Admitting: Family Medicine

## 2015-07-30 NOTE — Telephone Encounter (Signed)
Please advise. Pt was given dextromethorphan at the ER. Need pt to come in for ER follow up?

## 2015-07-30 NOTE — Telephone Encounter (Signed)
Agree w/ holding off on dextromethorphan.  He can try honey and lemon in tea (enough honey so it's thickened) and/or Elderberry syrup/lozenges

## 2015-07-30 NOTE — Telephone Encounter (Signed)
Called and informed pt wife. She stated an understanding.

## 2015-07-30 NOTE — Telephone Encounter (Signed)
Pt wife called stating that pt woke up Sunday with a cough and disorientation. They called triage nurse and were advised to go to ER which they did. Pt did not have pneumonia. ER prescribed cough med but once home after getting it from the pharmacy it said not to take if taking Parkinson's meds, which she states pt does take. She is concerned and not sure what to do. Please call her home # [phone].

## 2015-07-31 ENCOUNTER — Ambulatory Visit: Payer: Medicare Other

## 2015-07-31 DIAGNOSIS — R471 Dysarthria and anarthria: Secondary | ICD-10-CM

## 2015-07-31 DIAGNOSIS — R41841 Cognitive communication deficit: Secondary | ICD-10-CM

## 2015-07-31 DIAGNOSIS — R1310 Dysphagia, unspecified: Secondary | ICD-10-CM

## 2015-07-31 NOTE — Patient Instructions (Signed)
  You are able to tell me your swallow precautions but are not implementing them during meals. We see this happening sometimes with people who have Parkinson's.  Because of this, Mrs. Sedivy is not nagging you when she tells you things during your meals, she's helping to keep you from getting a feeding tube.

## 2015-07-31 NOTE — Therapy (Signed)
[facility] [address] [place], [place], [address] Phone: [phone]   Fax:  [phone]  Speech Language Pathology Treatment  Patient Details  Name: [name] MRN: [id number] Date of Birth: November 01, 1941 Referring Provider:  Sheliah Hatch, MD  Encounter Date: 07/31/2015      End of Session - 07/31/15 1632    Visit Number 6   Number of Visits 16   Date for SLP Re-Evaluation 08/10/15   SLP Start Time 1533   SLP Stop Time  1615   SLP Time Calculation (min) 42 min   Activity Tolerance Patient tolerated treatment well      Past Medical History  Diagnosis Date  . Parkinson disease   . Bradycardia     syncope due to prolonged pauses, resolved with PPM  . Sleep apnea   . Hypertension   . Dermatitis   . Depression   . Rib fractures 2011    S/p fall  . Maxillary fracture 2009    Right  . BPH (benign prostatic hypertrophy)   . Coronary artery disease 1986    Inferior wall MI treated with Angioplasty  . Normal echocardiogram 2007    Mild LVH with Impaired relaxation, Mild-Mod. Aortic Root dilation, Mild Aortic Sclerosis, Mild MR, Mild TR, Ef-50-55%  . Normal nuclear stress test 2006    Normal EF-54%  . CAD (coronary artery disease)   . Inferior MI   . 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 Maxillary fracture 2009    Right side  . Rib fracture 07/2010  . Atrioventricular block, complete   . Pacemaker   . H/O hiatal hernia   . Posterior vitreous detachment 03/04/2012  . Herpetic gingivostomatitis 01/19/2015  . Olecranon bursitis of left elbow 01/19/2015  . Infection and inflammatory reaction due to nervous system device, implant, and graft 05/15/2015  . Dysphagia     Past Surgical History  Procedure Laterality Date  . Pacemaker insertion  10/28/10    MDT implanted by Dr Ladona Ridgel  . Deep brain stimulator placement      Parkinsons  . Arthroscopic knee      Left knee  . Cataract surgery       Bilateral  . Inguinal hernia repair      Bilateral  . Cardiac catheterization      Multiple Angioplasties, last 2002  . Removal of ganglion cyst      right wrist  . Insertion / placement / revision neurostimulator    . Eye surgery    . Insert / replace / remove pacemaker      There were no vitals filed for this visit.  Visit Diagnosis: Dysphagia  Cognitive communication deficit  Dysarthria      Subjective Assessment - 07/31/15 1534    Subjective E.D. Sunday - fell Saturday night and Sunday morning. Took CXR - WNL.   Patient is accompained by: Family member  wife               ADULT SLP TREATMENT - 07/31/15 1539    General Information   Behavior/Cognition Cooperative;Pleasant mood;Distractible   Treatment Provided   Treatment provided Dysphagia   Dysphagia Treatment   Temperature Spikes Noted No   Treatment Methods Patient/caregiver education;Skilled observation;Therapeutic exercise   Patient observed directly with PO's Yes   Other treatment/comments Pt told SLP all precautions except intermittent throat clear. SLP assessed pt's success with following precautions and pt req' d consistent min cues with all  precautions. With HEP, pt req'd usual min A to complete correctly.   Pain Assessment   Pain Assessment No/denies pain   Assessment / Recommendations / Plan   Plan Continue with current plan of care   Progression Toward Goals   Progression toward goals Progressing toward goals  wife or caregiver assists with HEP/precautions          SLP Education - 07/31/15 1632    Education provided Yes   Education Details precautions, HEP, reason for wife assisting with precautions during meals   Person(s) Educated Patient   Methods Explanation;Demonstration;Verbal cues   Comprehension Verbalized understanding;Returned demonstration;Verbal cues required;Need further instruction          SLP Short Term Goals - 07/31/15 1634    SLP SHORT TERM GOAL #1   Title pt  will complete HEP with consistent min A from wife or SLP   Status Achieved   SLP SHORT TERM GOAL #2   Title pt will follow swallow precautions with consistent min A from wife or SLP with POs   Status Achieved   SLP SHORT TERM GOAL #3   Title pt will complete HEP at least 5 days/week, as reported by wife   Status Achieved          SLP Long Term Goals - 07/31/15 1635    SLP LONG TERM GOAL #1   Title pt will complete HEP with usual min A from wife or SLP    Status Achieved   SLP LONG TERM GOAL #2   Title pt will follow swallow precautions with usual min A from wife or SLP   Time 4   Period Weeks   Status On-going          Plan - 07/31/15 1633    Clinical Impression Statement Pt cont to require caregiver assistance with HEP, and supervision with meals in order to follow precautions. Skilled ST expected to cont one more session with caregiver to hone her skills in assisting pt with HEP, and with POs, if needed.   Speech Therapy Frequency 1x /week   Duration 4 weeks   Treatment/Interventions Aspiration precaution training;Pharyngeal strengthening exercises;Oral motor exercises;Diet toleration management by SLP;Cueing hierarchy;Compensatory techniques;Environmental controls;SLP instruction and feedback;Patient/family education   Potential to Achieve Goals Fair   Potential Considerations Severity of impairments;Previous level of function;Cooperation/participation level        Problem List Patient Active Problem List   Diagnosis Date Noted  . Prolonged Q-T interval on ECG 06/06/2015  . Allergic reaction 06/05/2015  . HCAP (healthcare-associated pneumonia) 06/04/2015  . Encephalopathy, metabolic 06/04/2015  . 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 Cognitive decline 05/07/2015  . Aspiration pneumonia 01/21/2015  . Healed myocardial infarct 08/23/2013  . Obstructive apnea 08/23/2013  . B12 deficiency 02/03/2013  . Idiopathic Parkinson's disease 08/29/2011   . Hyperlipidemia 08/22/2011  . CAD (coronary artery disease) 08/22/2011  . Dementia 08/22/2011  . Atrial fibrillation 01/21/2011  . Cardiac pacemaker in situ 01/21/2011    [facility] , MS, CCC-SLP  07/31/2015, 4:39 PM  [facility] [address] [place], [place], [address] Phone: [phone]   Fax:  [phone]

## 2015-08-16 ENCOUNTER — Ambulatory Visit: Payer: Medicare Other | Attending: Physical Medicine & Rehabilitation

## 2015-08-16 ENCOUNTER — Telehealth: Payer: Self-pay | Admitting: General Practice

## 2015-08-16 DIAGNOSIS — R1310 Dysphagia, unspecified: Secondary | ICD-10-CM | POA: Insufficient documentation

## 2015-08-16 DIAGNOSIS — R41841 Cognitive communication deficit: Secondary | ICD-10-CM | POA: Diagnosis present

## 2015-08-16 DIAGNOSIS — R471 Dysarthria and anarthria: Secondary | ICD-10-CM | POA: Insufficient documentation

## 2015-08-16 DIAGNOSIS — J690 Pneumonitis due to inhalation of food and vomit: Secondary | ICD-10-CM

## 2015-08-16 DIAGNOSIS — G20 Parkinson's disease: Secondary | ICD-10-CM

## 2015-08-16 NOTE — Therapy (Signed)
[facility] [address] [place], [place], [address] Phone: [phone]   Fax:  [phone]  Speech Language Pathology Treatment  Patient Details  Name: [name] MRN: [id number] Date of Birth: 17-Jun-1941 Referring Provider:  Sheliah Hatch, MD  Encounter Date: 08/16/2015      End of Session - 08/16/15 1635    Visit Number 7   Number of Visits 16   Date for SLP Re-Evaluation 08/10/15   SLP Start Time 1317   SLP Stop Time  1400   SLP Time Calculation (min) 43 min   Activity Tolerance Patient tolerated treatment well      Past Medical History  Diagnosis Date  . Parkinson disease   . Bradycardia     syncope due to prolonged pauses, resolved with PPM  . Sleep apnea   . Hypertension   . Dermatitis   . Depression   . Rib fractures 2011    S/p fall  . Maxillary fracture 2009    Right  . BPH (benign prostatic hypertrophy)   . Coronary artery disease 1986    Inferior wall MI treated with Angioplasty  . Normal echocardiogram 2007    Mild LVH with Impaired relaxation, Mild-Mod. Aortic Root dilation, Mild Aortic Sclerosis, Mild MR, Mild TR, Ef-50-55%  . Normal nuclear stress test 2006    Normal EF-54%  . CAD (coronary artery disease)   . Inferior MI   . 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 Maxillary fracture 2009    Right side  . Rib fracture 07/2010  . Atrioventricular block, complete   . Pacemaker   . H/O hiatal hernia   . Posterior vitreous detachment 03/04/2012  . Herpetic gingivostomatitis 01/19/2015  . Olecranon bursitis of left elbow 01/19/2015  . Infection and inflammatory reaction due to nervous system device, implant, and graft 05/15/2015  . Dysphagia     Past Surgical History  Procedure Laterality Date  . Pacemaker insertion  10/28/10    MDT implanted by Dr Ladona Ridgel  . Deep brain stimulator placement      Parkinsons  . Arthroscopic knee      Left knee  . Cataract surgery     Bilateral  . Inguinal hernia repair      Bilateral  . Cardiac catheterization      Multiple Angioplasties, last 2002  . Removal of ganglion cyst      right wrist  . Insertion / placement / revision neurostimulator    . Eye surgery    . Insert / replace / remove pacemaker      There were no vitals filed for this visit.  Visit Diagnosis: Dysphagia  Cognitive communication deficit  Dysarthria      Subjective Assessment - 08/16/15 1317    Subjective Pt arrives with caregiver Elnita Maxwell and his wife.   Patient is accompained by: --  wife, and cheryl, caregiver               ADULT SLP TREATMENT - 08/16/15 1319    General Information   Behavior/Cognition Cooperative;Pleasant mood;Distractible   Treatment Provided   Treatment provided Dysphagia   Dysphagia Treatment   Temperature Spikes Noted No   Treatment Methods Patient/caregiver education;Skilled observation;Therapeutic exercise   Patient observed directly with PO's Yes   Type of PO's observed Dysphagia 1 (puree);Nectar-thick liquids   Oral Phase Signs & Symptoms --  none noted   Pharyngeal Phase Signs & Symptoms --  none noted  Type of cueing Verbal   Amount of cueing Moderate  usually, for precautions   Other treatment/comments Min A req'd initially with caregiver performing HEP with pt. Caregiver independent with HEP for remainder of session. With POs, pt req'd cues for following precautions with frequency not unlike at home per wife. Wife and caregiver both cued pt appropriately for precautions.   Pain Assessment   Pain Assessment No/denies pain   Assessment / Recommendations / Plan   Plan Discharge SLP treatment due to (comment)  max potential reached   Dysphagia Recommendations   Diet recommendations --  as rec during modified barium swallow at [facility] sips/bites;Slow rate  and others from [facility] at [facility] - pt wife has compensations   Progression Toward Goals   Progression toward  goals --  discharge - see goal summary          SLP Education - August 29, 2015 1635    Education provided Yes   Education Details HEP (caregiver) initially, then caregiver independent   Person(s) Educated Patient;Spouse;Caregiver(s)   Methods Explanation;Demonstration   Comprehension Verbalized understanding;Returned demonstration          SLP Short Term Goals - 07/31/15 1634    SLP SHORT TERM GOAL #1   Title pt will complete HEP with consistent min A from wife or SLP   Status Achieved   SLP [place] #2   Title pt will follow swallow precautions with consistent min A from wife or SLP with POs   Status Achieved   SLP [place] #3   Title pt will complete HEP at least 5 days/week, as reported by wife   Status Achieved          SLP Long Term Goals - Aug 29, 2015 1641    SLP LONG TERM GOAL #1   Title pt will complete HEP with usual min A from wife or SLP    Status Achieved   SLP LONG TERM GOAL #2   Title pt will follow swallow precautions with usual min A from wife or SLP   Time    Period    Status Paritally met - pt with incr'd confusion today per wife and exhibits decr'd attention   SLP LONG TERM GOAL #3   Title pt will participate in HEP tasks with support from wife and/or caregiver   Time 1   Period --  visit   Status Achieved  new goal today 08-26-15          Plan - 08/29/15 1639    Clinical Impression Statement Pt's goals will be followed through to today, in order to assess caregiver's success with working with pt with HEP as well as with POs. Pt's caregiver did well Malachy Mood) in A with pt in HEP as well as with POs. Pt has reached max potential. Pt should receive follow up modified barium swallow exam to determine any progress from 8 weeks of HEP.    Speech Therapy Frequency 1x /week   Duration One additional visit  (today's visit)   Treatment/Interventions Aspiration precaution training;Pharyngeal strengthening exercises;Oral motor exercises;Diet  toleration management by SLP;Cueing hierarchy;Compensatory techniques;Environmental controls;SLP instruction and feedback;Patient/family education   Potential to Achieve Goals Fair   Potential Considerations Severity of impairments;Previous level of function;Cooperation/participation level          G-Codes - 2015/08/29 1642    Functional Assessment Tool Used noms, clinical judgment   Functional Limitations Swallowing   Swallow Goal Status (C5885) At least 20 percent but  less than 40 percent impaired, limited or restricted   Swallow Discharge Status [id number]) At least 20 percent but less than 40 percent impaired, limited or restricted     SPEECH THERAPY RENEWAL/DISCHARGE SUMMARY  Visits from Start of Care: 7  Current functional level related to goals / functional outcomes: LTGs 2 and 3 (developed today) remained in effect for today's session.    Pt met all STGs. He met 2/3 LTGs, and met one partially (pt was more confused on last day of therapy and so did not follow precautions with usual min A from caregiver/wife despite appropriate cuing methods from them).  Remaining deficits: To be determined following modified barium swallow exam. Suspect pt still has some degree of oral and/or pharyngeal dysphagia. Mod-severe dysarthria Mod-severe cognitive-linguistic deficits   Education / Equipment: HEP, precautions for swallowing, appropriate techniques for caregiver completing HEP and helping pt with precautions, given pt's cognitive status. Plan: Patient agrees to discharge.  Patient goals were partially met. Patient is being discharged due to                                                     ?????pt has reached their maximum potential at this time. Pt should be scheduled for follow up modified barium swallow exam to assess if progress has been made with HEP.   Pt will be seen for today's session and then discharged. If agreed please sign.      Problem List Patient Active Problem List    Diagnosis Date Noted  . Prolonged Q-T interval on ECG 06/06/2015  . Allergic reaction 06/05/2015  . HCAP (healthcare-associated pneumonia) 06/04/2015  . Encephalopathy, metabolic 33/82/5053  . 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 Cognitive decline 05/07/2015  . Aspiration pneumonia ([place]) 01/21/2015  . Healed myocardial infarct 08/23/2013  . Obstructive apnea 08/23/2013  . B12 deficiency 02/03/2013  . Idiopathic Parkinson's disease ([place]) 08/29/2011  . Hyperlipidemia 08/22/2011  . CAD (coronary artery disease) 08/22/2011  . Dementia 08/22/2011  . Atrial fibrillation ([place]) 01/21/2011  . Cardiac pacemaker in situ 01/21/2011    [facility] , [place], [place]  08/16/2015, 4:46 PM  [place] [address] [place], [place], [address] Phone: [phone]   Fax:  [phone]

## 2015-08-16 NOTE — Telephone Encounter (Signed)
Order were placed, pt wife notified.

## 2015-08-16 NOTE — Telephone Encounter (Signed)
-----   Message from Sheliah Hatch, MD sent at 08/16/2015  2:13 PM EDT ----- Ok to enter modified barium swallow as indicated below (dx parkinson's or dysphagia)  ----- Message -----    From: Barron Alvine, CCC-SLP    Sent: 08/16/2015   2:02 PM      To: Sheliah Hatch, MD  Dr. Beverely Low- I have seen [name] for speech therapy for swallowing. We discharged today.   He has been doing swallowing exercises and I recommend a follow up modified barium swallow exam at either [facility] or [facility]. A prescription will be needed to have that done. You can order one via [facility] but the department is Acute Care services, not neurorehab. If you need assistance with scheduling that via [facility], please call [phone] and they should be able to assist you or the scheduler.   The pt is expecting a call with details of the exam by this time next week. I though that would give enough time for you all to do the referral.  Call please, with questions. Thanks!  Verdie Mosher, SLP [phone]

## 2015-08-17 ENCOUNTER — Other Ambulatory Visit (HOSPITAL_COMMUNITY): Payer: Self-pay | Admitting: Family Medicine

## 2015-08-17 DIAGNOSIS — R1310 Dysphagia, unspecified: Secondary | ICD-10-CM

## 2015-08-21 ENCOUNTER — Ambulatory Visit: Payer: Medicare Other

## 2015-08-24 ENCOUNTER — Ambulatory Visit (HOSPITAL_COMMUNITY)
Admission: RE | Admit: 2015-08-24 | Discharge: 2015-08-24 | Disposition: A | Discharge status: Home / Self Care | Payer: Medicare Other | Source: Ambulatory Visit | Attending: Family Medicine | Admitting: Family Medicine

## 2015-08-24 DIAGNOSIS — G20 Parkinson's disease: Secondary | ICD-10-CM

## 2015-08-24 DIAGNOSIS — R1310 Dysphagia, unspecified: Secondary | ICD-10-CM | POA: Diagnosis present

## 2015-08-24 DIAGNOSIS — J690 Pneumonitis due to inhalation of food and vomit: Secondary | ICD-10-CM

## 2015-08-24 NOTE — Procedures (Signed)
Objective Swallowing Evaluation: Other (Comment)  Patient Details  Name: [name] MRN: [id number] Date of Birth: [birth year]  Today's Date: 08/24/2015 Time: No Data Recorded-No Data Recorded No Data Recorded  Past Medical History:  Past Medical History  Diagnosis Date  . Parkinson disease   . Bradycardia     syncope due to prolonged pauses, resolved with PPM  . Sleep apnea   . Hypertension   . Dermatitis   . Depression   . Rib fractures 2011    S/p fall  . Maxillary fracture 2009    Right  . BPH (benign prostatic hypertrophy)   . Coronary artery disease 1986    Inferior wall MI treated with Angioplasty  . Normal echocardiogram 2007    Mild LVH with Impaired relaxation, Mild-Mod. Aortic Root dilation, Mild Aortic Sclerosis, Mild MR, Mild TR, Ef-50-55%  . Normal nuclear stress test 2006    Normal EF-54%  . CAD (coronary artery disease)   . Inferior MI   . 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 Maxillary fracture 2009    Right side  . Rib fracture 07/2010  . Atrioventricular block, complete   . Pacemaker   . H/O hiatal hernia   . Posterior vitreous detachment 03/04/2012  . Herpetic gingivostomatitis 01/19/2015  . Olecranon bursitis of left elbow 01/19/2015  . Infection and inflammatory reaction due to nervous system device, implant, and graft 05/15/2015  . Dysphagia    Past Surgical History:  Past Surgical History  Procedure Laterality Date  . Pacemaker insertion  10/28/10    MDT implanted by Dr Ladona Ridgel  . Deep brain stimulator placement      Parkinsons  . Arthroscopic knee      Left knee  . Cataract surgery      Bilateral  . Inguinal hernia repair      Bilateral  . Cardiac catheterization      Multiple Angioplasties, last 2002  . Removal of ganglion cyst      right wrist  . Insertion / placement / revision neurostimulator    . Eye surgery    . Insert / replace / remove pacemaker     HPI:  Other Pertinent Information: pt is a 74 yo with h/o  Parkinson's disease,  dysphagia, sleep apnea, CAD, hiatal hernia, HTN, rib fxs s/p fall in 2011, MI.   Referral form completed by wife reviewed and "dementia" checked off by wife for medical diagnosis.- but this is not indicated in his medical chart any where.   Pt had a deep brain stimulator placed at [facility] this year.  He does have h/o pna  - uncertain if aspiration.  Spouse Consuella Lose present and reports pt with mild coughing AFTER meals.  No recent pnas, nor weigh loss.  Pt has been completing swallowing exercises and presents for evaluation to determine readiness for dietary advancement.    Subjective: Pt arrives with caregiver Elnita Maxwell and his wife.  Assessment / Plan / Recommendation CHL IP CLINICAL IMPRESSIONS 08/24/2015  Therapy Diagnosis Mild oral phase dysphagia;Mild pharyngeal phase dysphagia  Clinical Impression Mild oropharyngeal dysphagia with sensorimotor compoenents.  Oral phase deficits continue with prolonged mastication, decreased oral coordination.  Premature spillage of boluses into pharynx with some liquids spilling to pyriform sinus before the swallow.  Mild pharyngeal residuals noted, however pt conducts reflexive dry swallows to aid clearance!  Trace laryngeal penetration/aspiration of thin that appeared to mix with secretions.  Cued cough/throat clearing removed trace aspirates.    Recommend pt  advance to soft/thin diet with strict precautions.  Encouraged pt to keep cough and phonation strong for maximal airway protection.   Using teach back, live video, educated pt/spouse to findings/recommendations.  Provided spouse with heimlich maneuver in writing.  Encouraged monitoring of swallowing closely and if pt with increased coughing with thin, switch to nectar with meals and thin water between meals.  All questions answered. Thanks for this consult.        CHL IP TREATMENT RECOMMENDATION 01/15/2015  Treatment Recommendations No treatment recommended at this time     CHL IP DIET  RECOMMENDATION 08/24/2015  SLP Diet Recommendations Dysphagia 3 (Mech soft);Thin  Liquid Administration via Cup, straw  Medication Administration Whole meds with puree or w/nectar, start and follow with liquids  Compensations Slow rate;Small sips/bites;Follow solids with liquid;Multiple dry swallows after each bite/sip, cough/throat clear, clean mouth after meals  Postural Changes and/or Swallow Maneuvers Stay upright      CHL IP OTHER RECOMMENDATIONS 08/24/2015  Recommended Consults (None)  Oral Care Recommendations Oral care BID  Other Recommendations (None)     CHL IP FOLLOW UP RECOMMENDATIONS 01/16/2015  Follow up Recommendations Home health SLP;24 hour supervision/assistance     CHL IP FREQUENCY AND DURATION 05/06/2014  Speech Therapy Frequency (ACUTE ONLY) min 2x/week  Treatment Duration 2 weeks         CHL IP REASON FOR REFERRAL 08/24/2015  Reason for Referral Objectively evaluate swallowing function     CHL IP ORAL PHASE 08/24/2015  Oral Phase Impaired      CHL IP PHARYNGEAL PHASE 08/24/2015  Pharyngeal Phase Impaired  Pharyngeal Comment premature spillage into pharynx noted across consistencies      CHL IP CERVICAL ESOPHAGEAL PHASE 08/24/2015  Cervical Esophageal Phase WFL    CHL IP GO 08/24/2015  Functional Assessment Tool Used MBS, clinical judgement  Functional Limitations Swallowing  Swallow Current Status (Z6109) CJ  Swallow Goal Status (U0454) CJ  Swallow Discharge Status [phone]) Jen Mow, MS [facility] SLP [phone]

## 2015-09-17 ENCOUNTER — Ambulatory Visit (INDEPENDENT_AMBULATORY_CARE_PROVIDER_SITE_OTHER): Payer: Medicare Other | Admitting: Cardiology

## 2015-09-17 ENCOUNTER — Encounter: Payer: Self-pay | Admitting: Cardiology

## 2015-09-17 VITALS — BP 106/80 | HR 84 | Ht 71.0 in | Wt 171.4 lb

## 2015-09-17 DIAGNOSIS — I480 Paroxysmal atrial fibrillation: Secondary | ICD-10-CM | POA: Diagnosis not present

## 2015-09-17 DIAGNOSIS — E785 Hyperlipidemia, unspecified: Secondary | ICD-10-CM

## 2015-09-17 LAB — CBC WITH DIFFERENTIAL/PLATELET
BASOS PCT: 1 % (ref 0–1)
Basophils Absolute: 0 10*3/uL (ref 0.0–0.1)
EOS ABS: 0.2 10*3/uL (ref 0.0–0.7)
EOS PCT: 5 % (ref 0–5)
HCT: 39.2 % (ref 39.0–52.0)
Hemoglobin: 12.4 g/dL — ABNORMAL LOW (ref 13.0–17.0)
Lymphocytes Relative: 37 % (ref 12–46)
Lymphs Abs: 1.7 10*3/uL (ref 0.7–4.0)
MCH: 26.3 pg (ref 26.0–34.0)
MCHC: 31.6 g/dL (ref 30.0–36.0)
MCV: 83.1 fL (ref 78.0–100.0)
MONO ABS: 0.5 10*3/uL (ref 0.1–1.0)
MPV: 10.7 fL (ref 8.6–12.4)
Monocytes Relative: 12 % (ref 3–12)
NEUTROS ABS: 2 10*3/uL (ref 1.7–7.7)
Neutrophils Relative %: 45 % (ref 43–77)
Platelets: 342 10*3/uL (ref 150–400)
RBC: 4.72 MIL/uL (ref 4.22–5.81)
RDW: 14.7 % (ref 11.5–15.5)
WBC: 4.5 10*3/uL (ref 4.0–10.5)

## 2015-09-17 LAB — BASIC METABOLIC PANEL
BUN: 19 mg/dL (ref 7–25)
CALCIUM: 9.1 mg/dL (ref 8.6–10.3)
CO2: 29 mmol/L (ref 20–31)
Chloride: 103 mmol/L (ref 98–110)
Creat: 0.93 mg/dL (ref 0.70–1.18)
GLUCOSE: 84 mg/dL (ref 65–99)
Potassium: 4.5 mmol/L (ref 3.5–5.3)
SODIUM: 138 mmol/L (ref 135–146)

## 2015-09-17 LAB — LIPID PANEL
CHOL/HDL RATIO: 2.8 ratio (ref <=5.0)
Cholesterol: 130 mg/dL (ref 125–200)
HDL: 47 mg/dL (ref >=40)
LDL CALC: 69 mg/dL (ref <130)
Triglycerides: 72 mg/dL (ref <150)
VLDL: 14 mg/dL (ref <30)

## 2015-09-17 LAB — HEPATIC FUNCTION PANEL
ALBUMIN: 3.8 g/dL (ref 3.6–5.1)
ALT: 10 U/L (ref 9–46)
AST: 25 U/L (ref 10–35)
Alkaline Phosphatase: 88 U/L (ref 40–115)
BILIRUBIN DIRECT: 0.1 mg/dL (ref <=0.2)
BILIRUBIN TOTAL: 0.4 mg/dL (ref 0.2–1.2)
Indirect Bilirubin: 0.3 mg/dL (ref 0.2–1.2)
Total Protein: 7 g/dL (ref 6.1–8.1)

## 2015-09-17 MED ORDER — NITROGLYCERIN 0.4 MG SL SUBL: 0.4000 mg | SUBLINGUAL_TABLET | SUBLINGUAL | Status: AC | PRN

## 2015-09-17 NOTE — Progress Notes (Signed)
Cardiology Office Note   Date:  09/18/2015   ID:  [name], DOB February 14, 1941, MRN [id number]  PCP:  Neena Rhymes, MD  Cardiologist: Cassell Clement MD  Chief Complaint  Patient presents with  . Atrial Fibrillation       History of Present Illness: [name] is a 74 y.o. male who presents for a four-month follow-up visit  This pleasant 74 year old retired [name] fund[employer] is seen for a scheduled four-month followup office visit. He has a past history of known coronary artery disease with a remote inferior wall myocardial infarction in 1986. He was treated with emergent angioplasty at that time. He had a repeat angioplasty in 2002. He had syncope in December 2011 and had long periods of asystole and has a dual-chamber pacemaker implanted since then. 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 He has severe dementia as well as severe Parkinson's and he does have a deep brain stimulator in place on the left side. He has had some falling spells. He is followed by neurology at [facility]. Since we last saw him he was hospitalized at [facility] for infected.  Brain stimulator leads.  He grew out Enterobacter and Staphylococcus.  The entire system had to be removed and a new system was implanted from the epigastrium Since last visit the patient has not been having any chest pain. No dizziness or syncope. He has sleep apnea and uses a CPAP machine successfully.  Patient was hospitalized for pneumonia July 25 through 06/07/2015. He has been having problems with constipation and drinks prune juice which has helped He remains on a gluten-free diet.  He also has dysphagia and has seen speech therapy and has to be on nectar thick liquids.  It was felt that his prior pneumonia may have been an aspiration pneumonia.  Past Medical History  Diagnosis Date  . Parkinson disease (HCC)   . Bradycardia     syncope due to  prolonged pauses, resolved with PPM  . Sleep apnea   . Hypertension   . Dermatitis   . Depression   . Rib fractures 2011    S/p fall  . Maxillary fracture (HCC) 2009    Right  . BPH (benign prostatic hypertrophy)   . Coronary artery disease 1986    Inferior wall MI treated with Angioplasty  . Normal echocardiogram 2007    Mild LVH with Impaired relaxation, Mild-Mod. Aortic Root dilation, Mild Aortic Sclerosis, Mild MR, Mild TR, Ef-50-55%  . Normal nuclear stress test 2006    Normal EF-54%  . CAD (coronary artery disease)   . Inferior MI (HCC)   . 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 Maxillary fracture (HCC) 2009    Right side  . Rib fracture 07/2010  . Atrioventricular block, complete (HCC)   . Pacemaker   . H/O hiatal hernia   . Posterior vitreous detachment 03/04/2012  . Herpetic gingivostomatitis 01/19/2015  . Olecranon bursitis of left elbow 01/19/2015  . Infection and inflammatory reaction due to nervous system device, implant, and graft (HCC) 05/15/2015  . Dysphagia     Past Surgical History  Procedure Laterality Date  . Pacemaker insertion  10/28/10    MDT implanted by Dr Ladona Ridgel  . Deep brain stimulator placement      Parkinsons  . Arthroscopic knee      Left knee  . Cataract surgery      Bilateral  . Inguinal  hernia repair      Bilateral  . Cardiac catheterization      Multiple Angioplasties, last 2002  . Removal of ganglion cyst      right wrist  . Insertion / placement / revision neurostimulator    . Eye surgery    . Insert / replace / remove pacemaker       Current Outpatient Prescriptions  Medication Sig Dispense Refill  . aspirin 81 MG tablet Take 81 mg by mouth daily.    . Calcium Carb-Cholecalciferol (CALCIUM-VITAMIN D) 600-400 MG-UNIT TABS Take 1 tablet by mouth 2 (two) times daily.     . carbidopa-levodopa (SINEMET IR) 25-250 MG per tablet Take 1 tablet by mouth 4 (four) times daily.    . Carbidopa-Levodopa ER (SINEMET CR) 25-100 MG tablet  controlled release Take 1 tablet by mouth at bedtime.    . dapsone 25 MG tablet Take 50 mg by mouth every morning.     . docusate sodium (COLACE) 100 MG capsule Take 1 capsule (100 mg total) by mouth 2 (two) times daily as needed for mild constipation. 60 capsule 0  . donepezil (ARICEPT) 10 MG tablet Take 10 mg by mouth daily.      Marland Kitchen escitalopram (LEXAPRO) 10 MG tablet Take 1 tablet (10 mg total) by mouth daily. 30 tablet 1  . ezetimibe (ZETIA) 10 MG tablet Take 10 mg by mouth daily.    . Magnesium 400 MG CAPS Take 400 mg by mouth daily. 30 capsule 1  . mirabegron ER (MYRBETRIQ) 50 MG TB24 tablet Take 1 tablet (50 mg total) by mouth daily. 30 tablet 1  . nitroGLYCERIN (NITROSTAT) 0.4 MG SL tablet Place 1 tablet (0.4 mg total) under the tongue every 5 (five) minutes as needed for chest pain. 25 tablet PRN  . QUEtiapine (SEROQUEL) 25 MG tablet Take 25 mg by mouth at bedtime.    . tamsulosin (FLOMAX) 0.4 MG CAPS capsule Take 1 capsule (0.4 mg total) by mouth daily. 30 capsule 1  . vitamin B-12 (CYANOCOBALAMIN) 1000 MCG tablet Take 1 tablet (1,000 mcg total) by mouth daily. 30 tablet 1  . vitamin C (ASCORBIC ACID) 500 MG tablet Take 500 mg by mouth 2 (two) times daily.    Marland Kitchen zinc gluconate 50 MG tablet Take 50 mg by mouth daily.     No current facility-administered medications for this visit.    Allergies:   Fortaz; Crestor; Lipitor; Rosuvastatin; Bactrim; Gluten; and Vancomycin    Social History:  The patient  reports that he has never smoked. He does not have any smokeless tobacco history on file. He reports that he does not drink alcohol or use illicit drugs.   Family History:  The patient's family history includes Heart attack in his father, mother, and sister; Heart disease in his brother and mother; Hypertension in his mother; Other in his father.    ROS:  Please see the history of present illness.   Otherwise, review of systems are positive for none.   All other systems are reviewed and  negative.    PHYSICAL EXAM: VS:  BP 106/80 mmHg  Pulse 84  Ht  (1.803 m)  Wt 171 lb 6.4 oz (77.747 kg)  BMI 23.92 kg/m2  SpO2 95% , BMI Body mass index is 23.92 kg/(m^2). GEN: Well nourished, well developed, in no acute distress HEENT: normal Neck: no JVD, carotid bruits, or masses Cardiac: RRR; no murmurs, rubs, or gallops,no edema  Respiratory:  clear to auscultation bilaterally, normal  work of breathing GI: soft, nontender, nondistended, + BS.  The generator for the deep brain stimulator is now palpable in the epigastrium MS: no deformity or atrophy.  Patient has severe movement disorder related to his Parkinson's disease.  He also has slurred speech. Skin: warm and dry, no rash Neuro:  Strength and sensation are intact Psych: euthymic mood, full affect   EKG:  EKG is not ordered today.  He had an EKG on 06/07/15 which showed normal dual pacemaker function.    Recent Labs: 03/07/2015: TSH 1.71 06/05/2015: Magnesium 1.8 09/17/2015: ALT 10; BUN 19; Creat 0.93; Hemoglobin 12.4*; Platelets 342; Potassium 4.5; Sodium 138    Lipid Panel    Component Value Date/Time   CHOL 130 09/17/2015 1001   CHOL 150 11/06/2014 1101   TRIG 72 09/17/2015 1001   HDL 47 09/17/2015 1001   HDL 49 11/06/2014 1101   CHOLHDL 2.8 09/17/2015 1001   CHOLHDL 3.1 11/06/2014 1101   VLDL 14 09/17/2015 1001   LDLCALC 69 09/17/2015 1001   LDLCALC 87 11/06/2014 1101      Wt Readings from Last 3 Encounters:  09/17/15 171 lb 6.4 oz (77.747 kg)  06/18/15 166 lb 2 oz (75.354 kg)  06/04/15 160 lb (72.576 kg)         ASSESSMENT AND PLAN:  1. Ischemic heart disease with remote inferior wall myocardial infarction in 1986. Repeat angioplasty in 2002. 2. 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 severe Parkinson's disease with deep brain stimulator, followed by neurology at [facility]. Recent repositioning of the battery pack because of erosion through the skin and subsequent infected leads. 5. severe dementia 6. history of bladder spasms followed by urology Dr. McDiarmid 7. Sleep apnea, uses CPAP  Disposition: We are checking lab work today. Recheck in 4 months for office visit CBC lipid panel hepatic function panel and basal metabolic panel.  He will follow-up with Dr. Elease Hashimoto. Continue current medication.   Current medicines are reviewed at length with the patient today.  The patient does not have concerns regarding medicines.  The following changes have been made:  no change  Labs/ tests ordered today include:   Orders Placed This Encounter  Procedures  . Lipid panel  . Hepatic function panel  . Basic metabolic panel  . CBC with Differential/Platelet      Signed, Cassell Clement MD 09/18/2015 12:13 PM    [facility] [address] [place], [place], [place]  [address] Phone: [phone]; Fax: [phone]

## 2015-09-17 NOTE — Patient Instructions (Signed)
Medication Instructions:  Your physician recommends that you continue on your current medications as directed. Please refer to the Current Medication list given to you today.  Labwork: LP/BMET/HFP/CBC  Testing/Procedures: NONE  Follow-Up: Your physician recommends that you schedule a follow-up appointment in: 4 MONTH OV/EKG WITH DR Elease Hashimoto  If you need a refill on your cardiac medications before your next appointment, please call your pharmacy.

## 2015-09-18 NOTE — Progress Notes (Signed)
Quick Note:  Please report to patient. The recent labs are stable. Continue same medication and careful diet. Send copy to Dr. Danella Deis. ______

## 2015-10-03 ENCOUNTER — Ambulatory Visit (INDEPENDENT_AMBULATORY_CARE_PROVIDER_SITE_OTHER): Payer: Medicare Other | Admitting: *Deleted

## 2015-10-03 DIAGNOSIS — I442 Atrioventricular block, complete: Secondary | ICD-10-CM

## 2015-10-03 NOTE — Progress Notes (Signed)
Remote pacemaker transmission.   

## 2015-10-15 LAB — CUP PACEART REMOTE DEVICE CHECK
Battery Impedance: 1789 Ohm
Battery Remaining Longevity: 25 mo
Battery Voltage: 2.74 V
Brady Statistic AP VP Percent: 97 %
Brady Statistic AS VS Percent: 0 %
Implantable Lead Implant Date: 20111219
Implantable Lead Location: 753860
Implantable Lead Model: 5076
Lead Channel Impedance Value: 479 Ohm
Lead Channel Impedance Value: 481 Ohm
Lead Channel Pacing Threshold Amplitude: 1.75 V
Lead Channel Pacing Threshold Pulse Width: 0.4 ms
Lead Channel Setting Pacing Amplitude: 2.5 V
Lead Channel Setting Pacing Amplitude: 3.5 V
MDC IDC LEAD IMPLANT DT: 20111219
MDC IDC LEAD LOCATION: 753859
MDC IDC MSMT LEADCHNL RV PACING THRESHOLD AMPLITUDE: 0.625 V
MDC IDC MSMT LEADCHNL RV PACING THRESHOLD PULSEWIDTH: 0.4 ms
MDC IDC SESS DTM: 20161123144947
MDC IDC SET LEADCHNL RV PACING PULSEWIDTH: 0.4 ms
MDC IDC SET LEADCHNL RV SENSING SENSITIVITY: 2.8 mV
MDC IDC STAT BRADY AP VS PERCENT: 0 %
MDC IDC STAT BRADY AS VP PERCENT: 3 %

## 2015-10-16 ENCOUNTER — Encounter: Payer: Self-pay | Admitting: Cardiology

## 2015-10-25 ENCOUNTER — Encounter: Payer: Self-pay | Admitting: General Practice

## 2016-01-01 ENCOUNTER — Ambulatory Visit: Payer: Medicare Other | Admitting: Internal Medicine

## 2016-01-02 ENCOUNTER — Encounter: Payer: Self-pay | Admitting: Internal Medicine

## 2016-01-02 ENCOUNTER — Ambulatory Visit (INDEPENDENT_AMBULATORY_CARE_PROVIDER_SITE_OTHER): Payer: Medicare Other | Admitting: Internal Medicine

## 2016-01-02 VITALS — BP 112/80 | HR 66 | Ht 71.0 in | Wt 171.6 lb

## 2016-01-02 DIAGNOSIS — I442 Atrioventricular block, complete: Secondary | ICD-10-CM

## 2016-01-02 LAB — CUP PACEART INCLINIC DEVICE CHECK
Date Time Interrogation Session: 20170222154906
Implantable Lead Location: 753859
Implantable Lead Model: 5076
Lead Channel Setting Pacing Pulse Width: 0.4 ms
Lead Channel Setting Sensing Sensitivity: 2.8 mV
MDC IDC LEAD IMPLANT DT: 20111219
MDC IDC LEAD IMPLANT DT: 20111219
MDC IDC LEAD LOCATION: 753860
MDC IDC SET LEADCHNL RA PACING AMPLITUDE: 3.5 V
MDC IDC SET LEADCHNL RV PACING AMPLITUDE: 2.5 V

## 2016-01-02 NOTE — Patient Instructions (Signed)
Medication Instructions:  Your physician recommends that you continue on your current medications as directed. Please refer to the Current Medication list given to you today.   Labwork: NONE  Testing/Procedures: NONE  Follow-Up: Your physician wants you to follow-up in: YEAR WITH  DR  TAYLOR  You will receive a reminder letter in the mail two months in advance. If you don't receive a letter, please call our office to schedule the follow-up appointment.  Any Other Special Instructions Will Be Listed Below (If Applicable).     If you need a refill on your cardiac medications before your next appointment, please call your pharmacy.   

## 2016-01-02 NOTE — Progress Notes (Signed)
HPI [name] returns today for followup. He is a very pleasant 75 year old man with a history of symptomatic bradycardia, status post permanent pacemaker insertion, history of Parkinson's disease, with a neural stimulator in place. In the interim, he has been stable. He does have a mild tremor which is fairly well controlled. He had to undergo revision of his neural stimulator and he is healing nicely. He denies fever or chills. Allergies  Allergen Reactions  . Fortaz [Ceftazidime Sodium In D5w] Hives    Questionable reaction to [employer].  Rash on forehead, nose, neck, several whelps all over back/side, possible swelling of lips developed 2 hours after 2nd dose of Ceftaz on 06/05/15. Patient received 1st doses of Vanc, Cipro, Ceftaz several hours (>8h) prior to reaction.  Pt tolerated Cefepime course previously.  . Crestor [Rosuvastatin Calcium] Other (See Comments)    Reaction:  Muscle weakness   . Lipitor [Atorvastatin] Other (See Comments)    Joint pain  . Rosuvastatin Dermatitis and Other (See Comments)    Reaction:  Muscle weakness   . Bactrim Rash  . Gluten Rash and Other (See Comments)    Pts wife states that he can eat some gluten.    . Vancomycin Rash    Red man syndrome. Must infuse slowly. Consider premedication with Benadryl.     Current Outpatient Prescriptions  Medication Sig Dispense Refill  . aspirin 81 MG tablet Take 81 mg by mouth daily.    . Calcium Carb-Cholecalciferol (CALCIUM-VITAMIN D) 600-400 MG-UNIT TABS Take 1 tablet by mouth 2 (two) times daily.     . carbidopa-levodopa (SINEMET IR) 25-250 MG per tablet Take 1 tablet by mouth 4 (four) times daily.    . Carbidopa-Levodopa ER (SINEMET CR) 25-100 MG tablet controlled release Take 1 tablet by mouth at bedtime.    . dapsone 25 MG tablet Take 50 mg by mouth every morning.     . docusate sodium (COLACE) 100 MG capsule Take 1 capsule (100 mg total) by mouth 2 (two) times daily as needed for mild constipation. 60 capsule 0   . donepezil (ARICEPT) 10 MG tablet Take 10 mg by mouth daily.      Marland Kitchen escitalopram (LEXAPRO) 10 MG tablet Take 1 tablet (10 mg total) by mouth daily. 30 tablet 1  . ezetimibe (ZETIA) 10 MG tablet Take 10 mg by mouth daily.    . Magnesium 400 MG CAPS Take 400 mg by mouth daily. 30 capsule 1  . mirabegron ER (MYRBETRIQ) 50 MG TB24 tablet Take 1 tablet (50 mg total) by mouth daily. 30 tablet 1  . nitroGLYCERIN (NITROSTAT) 0.4 MG SL tablet Place 1 tablet (0.4 mg total) under the tongue every 5 (five) minutes as needed for chest pain. 25 tablet PRN  . QUEtiapine (SEROQUEL) 25 MG tablet Take 25 mg by mouth at bedtime.    . tamsulosin (FLOMAX) 0.4 MG CAPS capsule Take 1 capsule (0.4 mg total) by mouth daily. 30 capsule 1  . vitamin B-12 (CYANOCOBALAMIN) 1000 MCG tablet Take 1 tablet (1,000 mcg total) by mouth daily. 30 tablet 1  . vitamin C (ASCORBIC ACID) 500 MG tablet Take 500 mg by mouth 2 (two) times daily.    Marland Kitchen zinc gluconate 50 MG tablet Take 50 mg by mouth daily.     No current facility-administered medications for this visit.     Past Medical History  Diagnosis Date  . Parkinson disease (HCC)   . Bradycardia     syncope due to prolonged pauses, resolved  with PPM  . Sleep apnea   . Hypertension   . Dermatitis   . Depression   . Rib fractures 2011    S/p fall  . Maxillary fracture (HCC) 2009    Right  . BPH (benign prostatic hypertrophy)   . Coronary artery disease 1986    Inferior wall MI treated with Angioplasty  . Normal echocardiogram 2007    Mild LVH with Impaired relaxation, Mild-Mod. Aortic Root dilation, Mild Aortic Sclerosis, Mild MR, Mild TR, Ef-50-55%  . Normal nuclear stress test 2006    Normal EF-54%  . CAD (coronary artery disease)   . Inferior MI (HCC)   . 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 Maxillary fracture (HCC) 2009    Right side  . Rib fracture 07/2010  . Atrioventricular block, complete (HCC)   . Pacemaker   . H/O hiatal hernia   . Posterior  vitreous detachment 03/04/2012  . Herpetic gingivostomatitis 01/19/2015  . Olecranon bursitis of left elbow 01/19/2015  . Infection and inflammatory reaction due to nervous system device, implant, and graft (HCC) 05/15/2015  . Dysphagia     ROS:   All systems reviewed and negative except as noted in the HPI.   Past Surgical History  Procedure Laterality Date  . Pacemaker insertion  10/28/10    MDT implanted by Dr Ladona Ridgel  . Deep brain stimulator placement      Parkinsons  . Arthroscopic knee      Left knee  . Cataract surgery      Bilateral  . Inguinal hernia repair      Bilateral  . Cardiac catheterization      Multiple Angioplasties, last 2002  . Removal of ganglion cyst      right wrist  . Insertion / placement / revision neurostimulator    . Eye surgery    . Insert / replace / remove pacemaker       Family History  Problem Relation Age of Onset  . Heart attack Mother   . Heart disease Mother   . Hypertension Mother   . Heart attack Father   . Other Father     renal calculi  . Heart disease Brother   . Heart attack Sister      Social History   Social History  . Marital Status: Married    Spouse Name: N/A  . Number of Children: N/A  . Years of Education: N/A   Occupational History  . Not on file.   Social History Main Topics  . Smoking status: Never Smoker   . Smokeless tobacco: Not on file  . Alcohol Use: No  . Drug Use: No  . Sexual Activity: Not Currently   Other Topics Concern  . Not on file   Social History Narrative     BP 112/80 mmHg  Pulse 66  Ht 5\' 11"  (1.803 m)  Wt 171 lb 9.6 oz (77.837 kg)  BMI 23.94 kg/m2  Physical Exam:  Well appearing, blunted affect, mild to moderate dyskinesia, NAD, minimal tremor HEENT: Unremarkable Neck:  7 cm JVD, no thyromegally Lungs:  Clear with no wheezes, rales, or rhonchi. Well healed PPM incision. HEART:  Regular rate rhythm, no murmurs, no rubs, no clicks Abd:  soft, positive bowel sounds, no  organomegally, no rebound, no guarding Ext:  2 plus pulses, no edema, no cyanosis, no clubbing Skin:  No rashes no nodules Neuro:  CN II through XII intact, motor grossly intact    DEVICE  Normal device function.  See PaceArt for details.   Assess/Plan:  1. Complete heart block - he is doing well s/p PPM insertion 2. PPM - his Medtronic DDD PM is working normally. Will recheck in several months. 3. HTN - his blood pressure is well controlled.  4. Parkinson's - his disease is controlled. Will follow.  [name].D.

## 2016-01-14 ENCOUNTER — Ambulatory Visit (INDEPENDENT_AMBULATORY_CARE_PROVIDER_SITE_OTHER): Payer: Medicare Other | Admitting: Cardiovascular Disease

## 2016-01-14 ENCOUNTER — Encounter: Payer: Self-pay | Admitting: Cardiovascular Disease

## 2016-01-14 VITALS — BP 114/80 | HR 74 | Ht 71.0 in | Wt 179.4 lb

## 2016-01-14 DIAGNOSIS — E785 Hyperlipidemia, unspecified: Secondary | ICD-10-CM | POA: Diagnosis not present

## 2016-01-14 DIAGNOSIS — I25810 Atherosclerosis of coronary artery bypass graft(s) without angina pectoris: Secondary | ICD-10-CM

## 2016-01-14 DIAGNOSIS — I480 Paroxysmal atrial fibrillation: Secondary | ICD-10-CM

## 2016-01-14 NOTE — Progress Notes (Signed)
Cardiology Office Note   Date:  01/14/2016   ID:  [name], DOB 08/04/1941, MRN [id number]  PCP:  Danise Edge, MD  Cardiologist:   Vesta Mixer, MD   Chief Complaint  Patient presents with  . Coronary Artery Disease   Problem list 1. Remote coronary artery disease 2. Dementia 3. Pacemaker 4.     History of Present Illness: [name] is a 75 y.o. male who presents to establish care.  Previous patient of Dr Patty Sermons.   No CP or dyspnea.   Continued concerns and challenges with parkinsons and dementia.    Past Medical History  Diagnosis Date  . Parkinson disease (HCC)   . Bradycardia     syncope due to prolonged pauses, resolved with PPM  . Sleep apnea   . Hypertension   . Dermatitis   . Depression   . Rib fractures 2011    S/p fall  . Maxillary fracture (HCC) 2009    Right  . BPH (benign prostatic hypertrophy)   . Coronary artery disease 1986    Inferior wall MI treated with Angioplasty  . Normal echocardiogram 2007    Mild LVH with Impaired relaxation, Mild-Mod. Aortic Root dilation, Mild Aortic Sclerosis, Mild MR, Mild TR, Ef-50-55%  . Normal nuclear stress test 2006    Normal EF-54%  . CAD (coronary artery disease)   . Inferior MI (HCC)   . 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 Maxillary fracture (HCC) 2009    Right side  . Rib fracture 07/2010  . Atrioventricular block, complete (HCC)   . Pacemaker   . H/O hiatal hernia   . Posterior vitreous detachment 03/04/2012  . Herpetic gingivostomatitis 01/19/2015  . Olecranon bursitis of left elbow 01/19/2015  . Infection and inflammatory reaction due to nervous system device, implant, and graft (HCC) 05/15/2015  . Dysphagia     Past Surgical History  Procedure Laterality Date  . Pacemaker insertion  10/28/10    MDT implanted by Dr Ladona Ridgel  . Deep brain stimulator placement      Parkinsons  . Arthroscopic knee      Left knee  . Cataract surgery      Bilateral  . Inguinal hernia  repair      Bilateral  . Cardiac catheterization      Multiple Angioplasties, last 2002  . Removal of ganglion cyst      right wrist  . Insertion / placement / revision neurostimulator    . Eye surgery    . Insert / replace / remove pacemaker       Current Outpatient Prescriptions  Medication Sig Dispense Refill  . aspirin 81 MG tablet Take 81 mg by mouth daily.    . Calcium Carb-Cholecalciferol (CALCIUM-VITAMIN D) 600-400 MG-UNIT TABS Take 1 tablet by mouth 2 (two) times daily.     . carbidopa-levodopa (SINEMET IR) 25-250 MG per tablet Take 1 tablet by mouth 4 (four) times daily.    . Carbidopa-Levodopa ER (SINEMET CR) 25-100 MG tablet controlled release Take 1 tablet by mouth at bedtime.    . dapsone 25 MG tablet Take 50 mg by mouth every morning.     . docusate sodium (COLACE) 100 MG capsule Take 1 capsule (100 mg total) by mouth 2 (two) times daily as needed for mild constipation. 60 capsule 0  . donepezil (ARICEPT) 10 MG tablet Take 10 mg by mouth daily.      Marland Kitchen escitalopram (LEXAPRO) 10 MG  tablet Take 1 tablet (10 mg total) by mouth daily. 30 tablet 1  . ezetimibe (ZETIA) 10 MG tablet Take 10 mg by mouth daily.    . Magnesium 400 MG CAPS Take 400 mg by mouth daily. 30 capsule 1  . mirabegron ER (MYRBETRIQ) 50 MG TB24 tablet Take 1 tablet (50 mg total) by mouth daily. 30 tablet 1  . nitroGLYCERIN (NITROSTAT) 0.4 MG SL tablet Place 1 tablet (0.4 mg total) under the tongue every 5 (five) minutes as needed for chest pain. 25 tablet PRN  . QUEtiapine (SEROQUEL) 25 MG tablet Take 25 mg by mouth at bedtime.    . tamsulosin (FLOMAX) 0.4 MG CAPS capsule Take 1 capsule (0.4 mg total) by mouth daily. 30 capsule 1  . vitamin B-12 (CYANOCOBALAMIN) 1000 MCG tablet Take 1 tablet (1,000 mcg total) by mouth daily. 30 tablet 1  . vitamin C (ASCORBIC ACID) 500 MG tablet Take 500 mg by mouth 2 (two) times daily.    Marland Kitchen zinc gluconate 50 MG tablet Take 50 mg by mouth daily.     No current  facility-administered medications for this visit.    Allergies:   Fortaz; Crestor; Lipitor; Rosuvastatin; Bactrim; Gluten; and Vancomycin    Social History:  The patient  reports that he has never smoked. He does not have any smokeless tobacco history on file. He reports that he does not drink alcohol or use illicit drugs.   Family History:  The patient's family history includes Heart attack in his father, mother, and sister; Heart disease in his brother and mother; Hypertension in his mother; Other in his father.    ROS:  Please see the history of present illness.    Review of Systems: Constitutional:  denies fever, chills, diaphoresis, appetite change and fatigue.  HEENT: denies photophobia, eye pain, redness, hearing loss, ear pain, congestion, sore throat, rhinorrhea, sneezing, neck pain, neck stiffness and tinnitus.  Respiratory: denies SOB, DOE, cough, chest tightness, and wheezing.  Cardiovascular: denies chest pain, palpitations and leg swelling.  Gastrointestinal: denies nausea, vomiting, abdominal pain, diarrhea, constipation, blood in stool.  Genitourinary: denies dysuria, urgency, frequency, hematuria, flank pain and difficulty urinating.  Musculoskeletal: denies  myalgias, back pain, joint swelling, arthralgias and gait problem.   Skin: denies pallor, rash and wound.  Neurological: denies dizziness, seizures, syncope, weakness, light-headedness, numbness and headaches.   Hematological: denies adenopathy, easy bruising, personal or family bleeding history.  Psychiatric/ Behavioral: denies suicidal ideation, mood changes, confusion, nervousness, sleep disturbance and agitation.       All other systems are reviewed and negative.    PHYSICAL EXAM: VS:  BP 114/80 mmHg  Pulse 74  Ht 5\' 11"  (1.803 m)  Wt 179 lb 6.4 oz (81.375 kg)  BMI 25.03 kg/m2 , BMI Body mass index is 25.03 kg/(m^2). GEN: Well nourished, well developed, in no acute distress HEENT:     He has leads up  to his brain from his deep brain stimulator on the left side of his head. Neck: no JVD, carotid bruits, or masses Cardiac: RRR; no murmurs, rubs, or gallops,no edema , pacer is palpated in the right upper chest. Respiratory:  clear to auscultation bilaterally, normal work of breathing GI: soft, nontender, nondistended, + BS, Deep brain stimulator is palpated in his RUQ. MS: no deformity or atrophy Skin: warm and dry, no rash Neuro:  Grossly intact.  Walks with a walker,    Psych: fairly severe dementia.   Wife answered most questions  EKG:  EKG  is ordered today. The ekg ordered today demonstrates significant artifact due to deep brain stimulator .   AV pacing    Recent Labs: 03/07/2015: TSH 1.71 06/05/2015: Magnesium 1.8 09/17/2015: ALT 10; BUN 19; Creat 0.93; Hemoglobin 12.4*; Platelets 342; Potassium 4.5; Sodium 138    Lipid Panel    Component Value Date/Time   CHOL 130 09/17/2015 1001   CHOL 150 11/06/2014 1101   TRIG 72 09/17/2015 1001   HDL 47 09/17/2015 1001   HDL 49 11/06/2014 1101   CHOLHDL 2.8 09/17/2015 1001   CHOLHDL 3.1 11/06/2014 1101   VLDL 14 09/17/2015 1001   LDLCALC 69 09/17/2015 1001   LDLCALC 87 11/06/2014 1101      Wt Readings from Last 3 Encounters:  01/14/16 179 lb 6.4 oz (81.375 kg)  01/02/16 171 lb 9.6 oz (77.837 kg)  09/17/15 171 lb 6.4 oz (77.747 kg)      Other studies Reviewed: Additional studies/ records that were reviewed today include: . Review of the above records demonstrates:    ASSESSMENT AND PLAN:  1.  CAD- has remote history of coronary artery disease. Is not currently having any episodes of angina. 2. Pacer 3   Hyperlipidemia - continue Zetia. We'll check fasting lipids, liver enzymes, and basic medical profile in 6 months. 4. Parkinsons 5. Dermatitis herpetiformis ( gluten allergy)  On Dapsone,   Followed by Dermatolgy   Current medicines are reviewed at length with the patient today.  The patient does not have concerns  regarding medicines.  The following changes have been made:  no change  Labs/ tests ordered today include:  No orders of the defined types were placed in this encounter.    Disposition:   FU with me in 6 months    Laquitta Dominski, Deloris Ping, MD  01/14/2016 9:39 AM    [facility] [address] [place], [place], [place]  [address] Phone: [phone]; Fax: [phone]   [facility]  [address] [place], [place]  [address] [phone]   Fax [phone]

## 2016-01-14 NOTE — Patient Instructions (Signed)

## 2016-03-07 ENCOUNTER — Telehealth: Payer: Self-pay | Admitting: *Deleted

## 2016-03-07 ENCOUNTER — Encounter: Payer: Self-pay | Admitting: *Deleted

## 2016-03-07 NOTE — Telephone Encounter (Signed)
Pre-Visit Call completed with patient and chart updated.   Pre-Visit Info documented in Specialty Comments under [employer].    

## 2016-03-10 ENCOUNTER — Encounter: Payer: Self-pay | Admitting: Family Medicine

## 2016-03-10 ENCOUNTER — Ambulatory Visit (INDEPENDENT_AMBULATORY_CARE_PROVIDER_SITE_OTHER): Payer: Medicare Other | Admitting: Family Medicine

## 2016-03-10 VITALS — BP 120/82 | HR 62 | Temp 97.7°F | Ht 70.0 in | Wt 177.0 lb

## 2016-03-10 DIAGNOSIS — I25810 Atherosclerosis of coronary artery bypass graft(s) without angina pectoris: Secondary | ICD-10-CM | POA: Diagnosis not present

## 2016-03-10 DIAGNOSIS — F0390 Unspecified dementia without behavioral disturbance: Secondary | ICD-10-CM

## 2016-03-10 DIAGNOSIS — N390 Urinary tract infection, site not specified: Secondary | ICD-10-CM | POA: Diagnosis not present

## 2016-03-10 DIAGNOSIS — Z0000 Encounter for general adult medical examination without abnormal findings: Secondary | ICD-10-CM

## 2016-03-10 DIAGNOSIS — R1310 Dysphagia, unspecified: Secondary | ICD-10-CM

## 2016-03-10 DIAGNOSIS — Z23 Encounter for immunization: Secondary | ICD-10-CM

## 2016-03-10 DIAGNOSIS — G20 Parkinson's disease: Secondary | ICD-10-CM

## 2016-03-10 DIAGNOSIS — Z8619 Personal history of other infectious and parasitic diseases: Secondary | ICD-10-CM

## 2016-03-10 DIAGNOSIS — E785 Hyperlipidemia, unspecified: Secondary | ICD-10-CM

## 2016-03-10 HISTORY — DX: Personal history of other infectious and parasitic diseases: Z86.19

## 2016-03-10 HISTORY — DX: Dysphagia, unspecified: R13.10

## 2016-03-10 NOTE — Progress Notes (Signed)
Pre visit review using our clinic review tool, if applicable. No additional management support is needed unless otherwise documented below in the visit note. 

## 2016-03-10 NOTE — Assessment & Plan Note (Signed)
On nectar liquids

## 2016-03-10 NOTE — Patient Instructions (Signed)

## 2016-03-10 NOTE — Assessment & Plan Note (Signed)
Had the zostavax

## 2016-03-10 NOTE — Assessment & Plan Note (Signed)
Struggles with dysphagia and some sun downing, Seroquel is helpful

## 2016-03-23 ENCOUNTER — Encounter: Payer: Self-pay | Admitting: Family Medicine

## 2016-03-23 DIAGNOSIS — Z0000 Encounter for general adult medical examination without abnormal findings: Secondary | ICD-10-CM

## 2016-03-23 HISTORY — DX: Encounter for general adult medical examination without abnormal findings: Z00.00

## 2016-03-23 NOTE — Assessment & Plan Note (Signed)
Follows with [facility], doing well stays active at the gym and at home

## 2016-03-23 NOTE — Progress Notes (Signed)
Patient ID: [name], male   DOB: [birth year], 75 y.o.   MRN: [id number]   Subjective:    Patient ID: [name], male    DOB: [birth year], 75 y.o.   MRN: [id number]  Chief Complaint  Patient presents with  . Annual Exam    HPI Patient is in today for transfer of care and annual wellness visit. He is doing well today but does have numerous serious health conditions including parkinson's disease and heart disease. No significant new symptoms at this time. Follows with [facility] for his Parkinson's and with Dr McDiarmid at [facility] for his bladder concerns. Also follows with Dr Melburn Popper and Ladona Ridgel of cardiology and Dr Danella Deis of Dermatology. Denies CP/palp/SOB/HA/congestion/fevers/GI or GU c/o. Taking meds as prescribed  Past Medical History  Diagnosis Date  . Parkinson disease (HCC)   . Bradycardia     syncope due to prolonged pauses, resolved with PPM  . Sleep apnea   . Hypertension   . Dermatitis   . Depression   . Rib fractures 2011    S/p fall  . Maxillary fracture (HCC) 2009    Right  . BPH (benign prostatic hypertrophy)   . Coronary artery disease 1986    Inferior wall MI treated with Angioplasty  . Normal echocardiogram 2007    Mild LVH with Impaired relaxation, Mild-Mod. Aortic Root dilation, Mild Aortic Sclerosis, Mild MR, Mild TR, Ef-50-55%  . Normal nuclear stress test 2006    Normal EF-54%  . CAD (coronary artery disease)   . Inferior MI (HCC)   . 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 Maxillary fracture (HCC) 2009    Right side  . Rib fracture 07/2010  . Atrioventricular block, complete (HCC)   . Pacemaker   . H/O hiatal hernia   . Posterior vitreous detachment 03/04/2012  . Herpetic gingivostomatitis 01/19/2015  . Olecranon bursitis of left elbow 01/19/2015  . Infection and inflammatory reaction due to nervous system device, implant, and graft (HCC) 05/15/2015  . Dysphagia   . Dysphagia 03/10/2016  . History of chicken pox 03/10/2016  . Medicare annual  wellness visit, subsequent 03/23/2016    Past Surgical History  Procedure Laterality Date  . Pacemaker insertion  10/28/10    MDT implanted by Dr Ladona Ridgel  . Deep brain stimulator placement      Parkinsons  . Arthroscopic knee      Left knee  . Cataract surgery      Bilateral  . Inguinal hernia repair      Bilateral  . Cardiac catheterization      Multiple Angioplasties, last 2002  . Removal of ganglion cyst      right wrist  . Insertion / placement / revision neurostimulator    . Eye surgery    . Insert / replace / remove pacemaker      Family History  Problem Relation Age of Onset  . Heart attack Mother   . Heart disease Mother   . Hypertension Mother   . Heart attack Father   . Other Father     renal calculi  . Heart disease Father     5 MI  . Heart disease Brother 38    bypass double  . Hyperlipidemia Brother   . Hypertension Brother   . Heart attack Sister   . Heart disease Sister   . Heart disease Sister     Carita Pian syndrome and septal defect.     Social History  Social History  . Marital Status: Married    Spouse Name: N/A  . Number of Children: N/A  . Years of Education: N/A   Occupational History  . Not on file.   Social History Main Topics  . Smoking status: Never Smoker   . Smokeless tobacco: Not on file  . Alcohol Use: No  . Drug Use: No  . Sexual Activity: Not Currently   Other Topics Concern  . Not on file   Social History Narrative    Outpatient Prescriptions Prior to Visit  Medication Sig Dispense Refill  . aspirin 81 MG tablet Take 81 mg by mouth daily.    . Calcium Carb-Cholecalciferol (CALCIUM-VITAMIN D) 600-400 MG-UNIT TABS Take 1 tablet by mouth 2 (two) times daily.     . carbidopa-levodopa (SINEMET IR) 25-250 MG per tablet Take 1 tablet by mouth 4 (four) times daily.    . Carbidopa-Levodopa ER (SINEMET CR) 25-100 MG tablet controlled release Take 1 tablet by mouth at bedtime.    . dapsone 25 MG tablet Take 50 mg by mouth  every morning.     . docusate sodium (COLACE) 100 MG capsule Take 1 capsule (100 mg total) by mouth 2 (two) times daily as needed for mild constipation. 60 capsule 0  . donepezil (ARICEPT) 10 MG tablet Take 10 mg by mouth daily.      Marland Kitchen escitalopram (LEXAPRO) 10 MG tablet Take 1 tablet (10 mg total) by mouth daily. 30 tablet 1  . ezetimibe (ZETIA) 10 MG tablet Take 10 mg by mouth daily.    . Magnesium 400 MG CAPS Take 400 mg by mouth daily. 30 capsule 1  . mirabegron ER (MYRBETRIQ) 50 MG TB24 tablet Take 1 tablet (50 mg total) by mouth daily. 30 tablet 1  . nitroGLYCERIN (NITROSTAT) 0.4 MG SL tablet Place 1 tablet (0.4 mg total) under the tongue every 5 (five) minutes as needed for chest pain. 25 tablet PRN  . QUEtiapine (SEROQUEL) 25 MG tablet Take 25 mg by mouth at bedtime.    . tamsulosin (FLOMAX) 0.4 MG CAPS capsule Take 1 capsule (0.4 mg total) by mouth daily. 30 capsule 1  . vitamin B-12 (CYANOCOBALAMIN) 1000 MCG tablet Take 1 tablet (1,000 mcg total) by mouth daily. 30 tablet 1  . vitamin C (ASCORBIC ACID) 500 MG tablet Take 500 mg by mouth 2 (two) times daily.    Marland Kitchen zinc gluconate 50 MG tablet Take 50 mg by mouth daily.     No facility-administered medications prior to visit.    Allergies  Allergen Reactions  . Fortaz [Ceftazidime Sodium In D5w] Hives    Questionable reaction to [employer].  Rash on forehead, nose, neck, several whelps all over back/side, possible swelling of lips developed 2 hours after 2nd dose of Ceftaz on 06/05/15. Patient received 1st doses of Vanc, Cipro, Ceftaz several hours (>8h) prior to reaction.  Pt tolerated Cefepime course previously.  . Crestor [Rosuvastatin Calcium] Other (See Comments)    Reaction:  Muscle weakness   . Lipitor [Atorvastatin] Other (See Comments)    Joint pain  . Pneumococcal Vaccines Other (See Comments)    Per pt's wife, he developed PNA after receiving vaccine.  . Rosuvastatin Dermatitis and Other (See Comments)    Reaction:  Muscle  weakness   . Bactrim Rash  . Gluten Rash and Other (See Comments)    Pts wife states that he can eat some gluten.    . Vancomycin Rash    Red man syndrome.  Must infuse slowly. Consider premedication with Benadryl.    Review of Systems  Constitutional: Positive for malaise/fatigue. Negative for fever and chills.  HENT: Positive for hearing loss. Negative for congestion.   Eyes: Negative for discharge.  Respiratory: Negative for cough, sputum production and shortness of breath.   Cardiovascular: Negative for chest pain, palpitations and leg swelling.  Gastrointestinal: Negative for heartburn, nausea, vomiting, abdominal pain, diarrhea, constipation and blood in stool.  Genitourinary: Negative for dysuria, urgency, frequency and hematuria.  Musculoskeletal: Negative for myalgias, back pain and falls.  Skin: Negative for rash.  Neurological: Positive for tremors and weakness. Negative for dizziness, sensory change, loss of consciousness and headaches.  Endo/Heme/Allergies: Negative for environmental allergies. Does not bruise/bleed easily.  Psychiatric/Behavioral: Negative for depression and suicidal ideas. The patient is not nervous/anxious and does not have insomnia.        Objective:    Physical Exam  Constitutional: He is oriented to person, place, and time. He appears well-developed and well-nourished. No distress.  HENT:  Head: Normocephalic and atraumatic.  Eyes: Conjunctivae are normal.  Neck: Neck supple. No thyromegaly present.  Cardiovascular: Normal rate, regular rhythm and normal heart sounds.   Pulmonary/Chest: Effort normal and breath sounds normal. No respiratory distress. He has no wheezes.  Abdominal: Soft. Bowel sounds are normal. He exhibits no mass. There is no tenderness.  Musculoskeletal: He exhibits no edema.  Lymphadenopathy:    He has no cervical adenopathy.  Neurological: He is alert and oriented to person, place, and time.  Skin: Skin is warm and dry.    Psychiatric: He has a normal mood and affect. His behavior is normal.    BP 120/82 mmHg  Pulse 62  Temp(Src) 97.7 F (36.5 C) (Oral)  Ht 5\' 10"  (1.778 m)  Wt 177 lb (80.287 kg)  BMI 25.40 kg/m2  SpO2 97% Wt Readings from Last 3 Encounters:  03/10/16 177 lb (80.287 kg)  01/14/16 179 lb 6.4 oz (81.375 kg)  01/02/16 171 lb 9.6 oz (77.837 kg)     Lab Results  Component Value Date   WBC 4.5 09/17/2015   HGB 12.4* 09/17/2015   HCT 39.2 09/17/2015   PLT 342 09/17/2015   GLUCOSE 84 09/17/2015   CHOL 130 09/17/2015   TRIG 72 09/17/2015   HDL 47 09/17/2015   LDLCALC 69 09/17/2015   ALT 10 09/17/2015   AST 25 09/17/2015   NA 138 09/17/2015   K 4.5 09/17/2015   CL 103 09/17/2015   CREATININE 0.93 09/17/2015   BUN 19 09/17/2015   CO2 29 09/17/2015   TSH 1.71 03/07/2015   INR 1.32 06/05/2015    Lab Results  Component Value Date   TSH 1.71 03/07/2015   Lab Results  Component Value Date   WBC 4.5 09/17/2015   HGB 12.4* 09/17/2015   HCT 39.2 09/17/2015   MCV 83.1 09/17/2015   PLT 342 09/17/2015   Lab Results  Component Value Date   NA 138 09/17/2015   K 4.5 09/17/2015   CO2 29 09/17/2015   GLUCOSE 84 09/17/2015   BUN 19 09/17/2015   CREATININE 0.93 09/17/2015   BILITOT 0.4 09/17/2015   ALKPHOS 88 09/17/2015   AST 25 09/17/2015   ALT 10 09/17/2015   PROT 7.0 09/17/2015   ALBUMIN 3.8 09/17/2015   CALCIUM 9.1 09/17/2015   ANIONGAP 7 06/06/2015   GFR 116.46 03/07/2015   Lab Results  Component Value Date   CHOL 130 09/17/2015   Lab Results  Component  Value Date   HDL 47 09/17/2015   Lab Results  Component Value Date   LDLCALC 69 09/17/2015   Lab Results  Component Value Date   TRIG 72 09/17/2015   Lab Results  Component Value Date   CHOLHDL 2.8 09/17/2015   No results found for: HGBA1C     Assessment & Plan:   Problem List Items Addressed This Visit    Medicare annual wellness visit, subsequent    Patient denies any difficulties at home.  No trouble with ADLs, depression or falls. See EMR for functional status screen and depression screen. No recent changes to vision or hearing. Is UTD with immunizations. Is UTD with screening. Discussed Advanced Directives. Encouraged heart healthy diet, exercise as tolerated and adequate sleep. See patient's problem list for health risk factors to monitor. See AVS for preventative healthcare recommendation schedule. Given and reviewed copy of ACP documents from [employer] and encouraged to complete and return. Labs reviewed.       Idiopathic Parkinson's disease (HCC)    Follows with [facility], doing well stays active at the gym and at home      Hyperlipidemia    Encouraged heart healthy diet, increase exercise, avoid trans fats, consider a krill oil cap daily      History of chicken pox    Had the zostavax      Dysphagia    On nectar liquids      Dementia - Primary    Struggles with dysphagia and some sun downing, Seroquel is helpful       Other Visit Diagnoses    Need for diphtheria-tetanus-pertussis (Tdap) vaccine, adult/adolescent        Relevant Orders    Tdap vaccine greater than or equal to 7yo IM (Completed)       I am having Mr. Dupriest maintain his donepezil, dapsone, Calcium-Vitamin D, escitalopram, Magnesium, mirabegron ER, tamsulosin, vitamin B-12, QUEtiapine, vitamin C, zinc gluconate, aspirin, docusate sodium, carbidopa-levodopa, Carbidopa-Levodopa ER, ezetimibe, and nitroGLYCERIN.  No orders of the defined types were placed in this encounter.     Danise Edge, MD

## 2016-03-23 NOTE — Assessment & Plan Note (Signed)
Patient denies any difficulties at home. No trouble with ADLs, depression or falls. See EMR for functional status screen and depression screen. No recent changes to vision or hearing. Is UTD with immunizations. Is UTD with screening. Discussed Advanced Directives. Encouraged heart healthy diet, exercise as tolerated and adequate sleep. See patient's problem list for health risk factors to monitor. See AVS for preventative healthcare recommendation schedule. Given and reviewed copy of ACP documents from [place] Secretary of State and encouraged to complete and return. Labs reviewed 

## 2016-03-23 NOTE — Assessment & Plan Note (Signed)
Encouraged heart healthy diet, increase exercise, avoid trans fats, consider a krill oil cap daily 

## 2016-04-09 ENCOUNTER — Ambulatory Visit (INDEPENDENT_AMBULATORY_CARE_PROVIDER_SITE_OTHER): Payer: Medicare Other | Admitting: *Deleted

## 2016-04-09 ENCOUNTER — Other Ambulatory Visit: Payer: Self-pay | Admitting: Cardiology

## 2016-04-09 DIAGNOSIS — I442 Atrioventricular block, complete: Secondary | ICD-10-CM

## 2016-04-09 NOTE — Progress Notes (Signed)
Remote pacemaker transmission.   

## 2016-04-24 LAB — CUP PACEART REMOTE DEVICE CHECK
Battery Voltage: 2.72 V
Brady Statistic AS VP Percent: 1 %
Date Time Interrogation Session: 20170531132740
Implantable Lead Implant Date: 20111219
Implantable Lead Location: 753859
Implantable Lead Model: 5076
Lead Channel Pacing Threshold Amplitude: 1.7 V
Lead Channel Pacing Threshold Pulse Width: 0.4 ms
Lead Channel Setting Pacing Pulse Width: 0.4 ms
MDC IDC LEAD IMPLANT DT: 20111219
MDC IDC LEAD LOCATION: 753860
MDC IDC MSMT BATTERY IMPEDANCE: 2319 Ohm
MDC IDC MSMT BATTERY REMAINING LONGEVITY: 19 mo
MDC IDC MSMT LEADCHNL RA IMPEDANCE VALUE: 466 Ohm
MDC IDC MSMT LEADCHNL RA PACING THRESHOLD PULSEWIDTH: 0.4 ms
MDC IDC MSMT LEADCHNL RV IMPEDANCE VALUE: 505 Ohm
MDC IDC MSMT LEADCHNL RV PACING THRESHOLD AMPLITUDE: 0.75 V
MDC IDC SET LEADCHNL RA PACING AMPLITUDE: 3.5 V
MDC IDC SET LEADCHNL RV PACING AMPLITUDE: 2.5 V
MDC IDC SET LEADCHNL RV SENSING SENSITIVITY: 2.8 mV
MDC IDC STAT BRADY AP VP PERCENT: 99 %
MDC IDC STAT BRADY AP VS PERCENT: 0 %
MDC IDC STAT BRADY AS VS PERCENT: 0 %

## 2016-04-30 ENCOUNTER — Encounter: Payer: Self-pay | Admitting: Cardiology

## 2016-05-09 ENCOUNTER — Other Ambulatory Visit: Payer: Self-pay | Admitting: Family Medicine

## 2016-05-09 ENCOUNTER — Encounter: Payer: Self-pay | Admitting: Family Medicine

## 2016-05-09 DIAGNOSIS — N390 Urinary tract infection, site not specified: Secondary | ICD-10-CM

## 2016-05-09 NOTE — Telephone Encounter (Signed)
Called to follow up patient. Left a message for call back.   

## 2016-05-09 NOTE — Addendum Note (Signed)
Addended by: Eustace Quail on: 05/09/2016 03:38 PM   Modules accepted: Orders

## 2016-05-09 NOTE — Addendum Note (Signed)
Addended by: Eustace Quail on: 05/09/2016 03:39 PM   Modules accepted: Orders

## 2016-05-09 NOTE — Telephone Encounter (Signed)
Spoke to wife.  Wife states she received the message about bringing in urine sample.  Sample has been dropped off.  Wife describes patient's current states as more confused than normal and unable to control his bladder.  Denied chest pain, shortness of breath, or swelling, redness or increased warmth at or around insertion site (PICC Line).  Per wife, a nurse came out on Monday to change dressing and will be back next Monday, July 3rd.

## 2016-05-10 LAB — URINALYSIS
Bilirubin Urine: NEGATIVE
Glucose, UA: NEGATIVE
HGB URINE DIPSTICK: NEGATIVE
NITRITE: NEGATIVE
PH: 6 (ref 5.0–8.0)
PROTEIN: NEGATIVE
Specific Gravity, Urine: 1.022 (ref 1.001–1.035)

## 2016-05-11 LAB — CULTURE, URINE COMPREHENSIVE
Colony Count: NO GROWTH
ORGANISM ID, BACTERIA: NO GROWTH

## 2016-05-27 ENCOUNTER — Telehealth: Payer: Self-pay | Admitting: Family Medicine

## 2016-05-27 NOTE — Telephone Encounter (Signed)
Pt dropped off Power of Attorney for PCP to have on file (yellow envelope with Spouse Power of Attorney also), and a letter inside envelope for provider to see.

## 2016-06-14 ENCOUNTER — Other Ambulatory Visit: Payer: Self-pay | Admitting: Internal Medicine

## 2016-07-09 ENCOUNTER — Other Ambulatory Visit: Payer: Medicare Other

## 2016-07-09 ENCOUNTER — Ambulatory Visit: Payer: Medicare Other

## 2016-07-09 ENCOUNTER — Ambulatory Visit: Payer: Medicare Other | Admitting: Cardiovascular Disease

## 2016-07-16 ENCOUNTER — Ambulatory Visit: Payer: Medicare Other | Admitting: Cardiovascular Disease

## 2016-07-16 ENCOUNTER — Other Ambulatory Visit: Payer: Medicare Other

## 2016-07-17 ENCOUNTER — Ambulatory Visit (INDEPENDENT_AMBULATORY_CARE_PROVIDER_SITE_OTHER): Payer: Medicare Other | Admitting: *Deleted

## 2016-07-17 ENCOUNTER — Telehealth: Payer: Self-pay | Admitting: Cardiology

## 2016-07-17 DIAGNOSIS — I442 Atrioventricular block, complete: Secondary | ICD-10-CM

## 2016-07-17 NOTE — Telephone Encounter (Signed)
LMOVM reminding pt to send remote transmission.   

## 2016-07-17 NOTE — Progress Notes (Signed)
Remote pacemaker transmission.   

## 2016-07-21 ENCOUNTER — Encounter: Payer: Self-pay | Admitting: Cardiology

## 2016-07-29 LAB — CUP PACEART REMOTE DEVICE CHECK
Brady Statistic AP VS Percent: 0 %
Brady Statistic AS VP Percent: 2 %
Brady Statistic AS VS Percent: 0 %
Implantable Lead Implant Date: 20111219
Implantable Lead Location: 753859
Implantable Lead Model: 5076
Implantable Lead Model: 5076
Lead Channel Impedance Value: 494 Ohm
Lead Channel Pacing Threshold Amplitude: 0.625 V
Lead Channel Pacing Threshold Amplitude: 1.5 V
Lead Channel Pacing Threshold Pulse Width: 0.4 ms
Lead Channel Pacing Threshold Pulse Width: 0.4 ms
MDC IDC LEAD IMPLANT DT: 20111219
MDC IDC LEAD LOCATION: 753860
MDC IDC MSMT BATTERY IMPEDANCE: 2743 Ohm
MDC IDC MSMT BATTERY REMAINING LONGEVITY: 16 mo
MDC IDC MSMT BATTERY VOLTAGE: 2.71 V
MDC IDC MSMT LEADCHNL RA IMPEDANCE VALUE: 480 Ohm
MDC IDC SESS DTM: 20170907135941
MDC IDC SET LEADCHNL RA PACING AMPLITUDE: 3 V
MDC IDC SET LEADCHNL RV PACING AMPLITUDE: 2.5 V
MDC IDC SET LEADCHNL RV PACING PULSEWIDTH: 0.46 ms
MDC IDC SET LEADCHNL RV SENSING SENSITIVITY: 2.8 mV
MDC IDC STAT BRADY AP VP PERCENT: 98 %

## 2016-07-30 ENCOUNTER — Encounter: Payer: Self-pay | Admitting: Cardiovascular Disease

## 2016-08-06 LAB — HEPATIC FUNCTION PANEL
ALT: 8 U/L — AB (ref 10–40)
AST: 28 U/L (ref 14–40)
Alkaline Phosphatase: 66 U/L (ref 25–125)
Bilirubin, Total: 0.6 mg/dL

## 2016-08-06 LAB — BASIC METABOLIC PANEL
BUN: 19 mg/dL (ref 4–21)
CREATININE: 0.9 mg/dL (ref 0.6–1.3)
GLUCOSE: 94 mg/dL
Potassium: 4.3 mmol/L (ref 3.4–5.3)
Sodium: 143 mmol/L (ref 137–147)

## 2016-08-06 LAB — CBC AND DIFFERENTIAL
HCT: 43 % (ref 41–53)
Hemoglobin: 13.8 g/dL (ref 13.5–17.5)
Neutrophils Absolute: 4 /uL
PLATELETS: 298 10*3/uL (ref 150–399)
WBC: 7 10*3/mL

## 2016-08-11 ENCOUNTER — Telehealth: Payer: Self-pay | Admitting: Cardiovascular Disease

## 2016-08-11 NOTE — Telephone Encounter (Signed)
New message    [facility].     Patient in the hospital now - need clarification on Ejection Fraction of 20 % while in hospital remain stable  or acute inpatient work up.     Or should patient follow up with Dr. Melburn Popper

## 2016-08-11 NOTE — Telephone Encounter (Signed)
Attempted to call back to speak with Juleen Starr.  I spoke with someone on the stroke unit at [facility] who states Dr. Orie Rout and Dow Adolph have stepped away from the unit.  I gave them Dr. Harvie Bridge pager number for follow-up.

## 2016-08-11 NOTE — Telephone Encounter (Signed)
    I was Called from [facility].  Echo there showed EF 20%.  He is there for treatment of Parkinson's sx.  He will need f/u with Dr. Elease Hashimoto.   Corky Crafts, MD

## 2016-08-13 ENCOUNTER — Other Ambulatory Visit: Payer: Medicare Other

## 2016-08-13 ENCOUNTER — Ambulatory Visit: Payer: Medicare Other | Admitting: Cardiovascular Disease

## 2016-08-14 ENCOUNTER — Encounter: Payer: Self-pay | Admitting: Adult Health

## 2016-08-14 ENCOUNTER — Non-Acute Institutional Stay (SKILLED_NURSING_FACILITY): Payer: Medicare Other | Admitting: Adult Health

## 2016-08-14 DIAGNOSIS — N3281 Overactive bladder: Secondary | ICD-10-CM | POA: Diagnosis not present

## 2016-08-14 DIAGNOSIS — F32A Depression, unspecified: Secondary | ICD-10-CM

## 2016-08-14 DIAGNOSIS — F0280 Dementia in other diseases classified elsewhere without behavioral disturbance: Secondary | ICD-10-CM

## 2016-08-14 DIAGNOSIS — T847XXS Infection and inflammatory reaction due to other internal orthopedic prosthetic devices, implants and grafts, sequela: Secondary | ICD-10-CM

## 2016-08-14 DIAGNOSIS — G20 Parkinson's disease: Secondary | ICD-10-CM | POA: Diagnosis not present

## 2016-08-14 DIAGNOSIS — N400 Enlarged prostate without lower urinary tract symptoms: Secondary | ICD-10-CM

## 2016-08-14 DIAGNOSIS — G3183 Dementia with Lewy bodies: Secondary | ICD-10-CM | POA: Diagnosis not present

## 2016-08-14 DIAGNOSIS — I25810 Atherosclerosis of coronary artery bypass graft(s) without angina pectoris: Secondary | ICD-10-CM | POA: Diagnosis not present

## 2016-08-14 DIAGNOSIS — K5901 Slow transit constipation: Secondary | ICD-10-CM | POA: Diagnosis not present

## 2016-08-14 DIAGNOSIS — R531 Weakness: Secondary | ICD-10-CM

## 2016-08-14 DIAGNOSIS — I5021 Acute systolic (congestive) heart failure: Secondary | ICD-10-CM | POA: Diagnosis not present

## 2016-08-14 DIAGNOSIS — F329 Major depressive disorder, single episode, unspecified: Secondary | ICD-10-CM | POA: Diagnosis not present

## 2016-08-14 DIAGNOSIS — R1310 Dysphagia, unspecified: Secondary | ICD-10-CM | POA: Diagnosis not present

## 2016-08-14 NOTE — Progress Notes (Signed)
Patient ID: AMARDEEP BECKERS, male   DOB: 20-Nov-1940, 75 y.o.   MRN: [id number]    DATE:  08/14/2016   MRN:  [id number]  BIRTHDAY: [birth year]  Facility:  Nursing Home Location:  [facility]  Nursing Home Room Number: 106-P  LEVEL OF CARE:  SNF [phone])  Contact Information    Name Relation Home Work Mobile   [place] Spouse [phone]  [phone]   [place] Lights Daughter [phone]  [phone]       Code Status History    Date Active Date Inactive Code Status Order ID Comments User Context   06/04/2015  4:55 PM 06/07/2015  5:22 PM DNR [id number]  Conley Canal, MD Inpatient   06/04/2015  4:51 PM 06/04/2015  4:55 PM Full Code [id number]  Conley Canal, MD Inpatient   01/15/2015  2:58 AM 01/17/2015  4:12 PM DNR [id number]  Therisa Doyne, MD Inpatient   05/09/2014  3:51 PM 05/24/2014  2:32 PM Full Code [id number]  Charlton Amor, PA-C Inpatient   05/05/2014  6:36 PM 05/09/2014  3:51 PM Full Code [id number]  Kela Millin, MD Inpatient    Questions for Most Recent Historical Code Status (Order [id number])    Question Answer Comment   In the event of cardiac or respiratory ARREST Do not call a "code blue"    In the event of cardiac or respiratory ARREST Do not perform Intubation, CPR, defibrillation or ACLS    In the event of cardiac or respiratory ARREST Use medication by any route, position, wound care, and other measures to relive pain and suffering. May use oxygen, suction and manual treatment of airway obstruction as needed for comfort.         Advance Directive Documentation   Flowsheet Row Most Recent Value  Type of Advance Directive  Out of facility DNR (pink MOST or yellow form)  Pre-existing out of facility DNR order (yellow form or pink MOST form)  No data  "MOST" Form in Place?  No data       Chief Complaint  Patient presents with  . Hospitalization Follow-up    HISTORY OF PRESENT ILLNESS:  This is a 75 year old male who has has PMH of  Parkinson's disease S/P DBS 2006 and recent revision due to wire erosion through scalp and associated DBS hardware infection and dementia. He been admitted to [facility] on 08/13/16 from [facility] with progressive neurologic decline in setting of progression of Parkinson's Disease and new diagnosis of HFrEF. CT head with CTA and perfusion showed no acute large vascular territory infarct, symmetric perfusion, no evidence of aneurysm, stenosis or occlusion. MRI was contraindicated due to DBS. TTE showed an EF of 20% which has declined compared a value of 50% in 2007. Cardiology was consulted and assessed that decreased EF includes progressive CAD VS pacemaker induced RV pacing, cardiomyopathy. He was started on Coreg 3.125 mg BID and Lisinopril 5 mg daily. He was intolerant to several statins but after discussion with wife, she decided to continue Zetia. He has improved but not a complete return to baseline. He has been admitted for a short-term rehabilitation.   PAST MEDICAL HISTORY:  Past Medical History:  Diagnosis Date  . Atrioventricular block, complete (HCC)   . BPH (benign prostatic hypertrophy)   . Bradycardia    syncope due to prolonged pauses, resolved with PPM  . CAD (coronary artery disease)   . Coronary artery disease 1986   Inferior wall MI treated with Angioplasty  . Depression   .  Dermatitis   . 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 Dysphagia   . Dysphagia 03/10/2016  . H/O hiatal hernia   . Herpetic gingivostomatitis 01/19/2015  . History of chicken pox 03/10/2016  . Hypertension   . Infection and inflammatory reaction due to nervous system device, implant, and graft (HCC) 05/15/2015  . Inferior MI (HCC)   . Maxillary fracture (HCC) 2009   Right  . Maxillary fracture (HCC) 2009   Right side  . Medicare annual wellness visit, subsequent 03/23/2016  . Normal echocardiogram 2007   Mild LVH with Impaired relaxation, Mild-Mod. Aortic Root dilation, Mild Aortic  Sclerosis, Mild MR, Mild TR, Ef-50-55%  . Normal nuclear stress test 2006   Normal EF-54%  . Olecranon bursitis of left elbow 01/19/2015  . Pacemaker   . Parkinson disease (HCC)   . Posterior vitreous detachment 03/04/2012  . Rib fracture 07/2010  . Rib fractures 2011   S/p fall  . Sleep apnea      CURRENT MEDICATIONS: Reviewed  Patient's Medications  New Prescriptions   No medications on file  Previous Medications   ACETAMINOPHEN (TYLENOL) 325 MG TABLET    Take 650 mg by mouth every 6 (six) hours as needed for fever.   ASPIRIN 81 MG CHEWABLE TABLET    Chew 81 mg by mouth daily.   CALCIUM CARBONATE-VITAMIN D (CALCIUM-VITAMIN D) 500-200 MG-UNIT TABLET    Take 1 tablet by mouth 2 (two) times daily.   CARBIDOPA-LEVODOPA (SINEMET IR) 25-100 MG TABLET    Take 2 tablets by mouth 4 (four) times daily. 6AM, 10AM, 2PM, 6PM   CARBIDOPA-LEVODOPA (SINEMET IR) 25-100 MG TABLET    Take 1 tablet by mouth daily. Take at 2200   CARVEDILOL (COREG) 3.125 MG TABLET    Take 3.125 mg by mouth 2 (two) times daily with a meal.   CEPHALEXIN (KEFLEX) 500 MG CAPSULE    Take 500 mg by mouth 3 (three) times daily.   DAPSONE 25 MG TABLET    Take 50 mg by mouth daily. Take 2 tabs to = 50 mg   DONEPEZIL (ARICEPT) 10 MG TABLET    Take 10 mg by mouth daily.    DOXAZOSIN (CARDURA) 4 MG TABLET    Take 4 mg by mouth every evening.   ESCITALOPRAM (LEXAPRO) 10 MG TABLET    Take 1 tablet (10 mg total) by mouth daily.   EZETIMIBE (ZETIA) 10 MG TABLET    TAKE 1 TABLET BY MOUTH EVERY DAY   LISINOPRIL (PRINIVIL,ZESTRIL) 5 MG TABLET    Take 5 mg by mouth daily.   NITROGLYCERIN (NITROSTAT) 0.4 MG SL TABLET    Place 1 tablet (0.4 mg total) under the tongue every 5 (five) minutes as needed for chest pain.   OXYGEN    Inhale 2 L/min into the lungs daily as needed. To maintain sats >90   QUETIAPINE (SEROQUEL) 25 MG TABLET    Take 25 mg by mouth at bedtime.   RIFAMPIN (RIFADIN) 300 MG CAPSULE    Take 300 mg by mouth 2 (two) times  daily.   SENNOSIDES-DOCUSATE SODIUM (SENOKOT-S) 8.6-50 MG TABLET    Take 2 tablets by mouth at bedtime.   VITAMIN B-12 (CYANOCOBALAMIN) 1000 MCG TABLET    Take 1 tablet (1,000 mcg total) by mouth daily.   VITAMIN C (ASCORBIC ACID) 500 MG TABLET    Take 500 mg by mouth 3 (three) times daily.    ZINC SULFATE 220 (50 ZN) MG CAPSULE  Take 220 mg by mouth daily.  Modified Medications   No medications on file  Discontinued Medications   ASPIRIN 81 MG TABLET    Take 81 mg by mouth daily.   CALCIUM CARB-CHOLECALCIFEROL (CALCIUM-VITAMIN D) 600-400 MG-UNIT TABS    Take 1 tablet by mouth 2 (two) times daily.    CARBIDOPA-LEVODOPA (SINEMET IR) 25-250 MG PER TABLET    Take 1 tablet by mouth 4 (four) times daily.   CARBIDOPA-LEVODOPA ER (SINEMET CR) 25-100 MG TABLET CONTROLLED RELEASE    Take 1 tablet by mouth at bedtime.    DOCUSATE SODIUM (COLACE) 100 MG CAPSULE    Take 1 capsule (100 mg total) by mouth 2 (two) times daily as needed for mild constipation.   MAGNESIUM 400 MG CAPS    Take 400 mg by mouth daily.   MIRABEGRON ER (MYRBETRIQ) 50 MG TB24 TABLET    Take 1 tablet (50 mg total) by mouth daily.   TAMSULOSIN (FLOMAX) 0.4 MG CAPS CAPSULE    Take 1 capsule (0.4 mg total) by mouth daily.   ZINC GLUCONATE 50 MG TABLET    Take 50 mg by mouth daily.     Allergies  Allergen Reactions  . Fortaz [Ceftazidime Sodium In D5w] Hives    Questionable reaction to [employer].  Rash on forehead, nose, neck, several whelps all over back/side, possible swelling of lips developed 2 hours after 2nd dose of Ceftaz on 06/05/15. Patient received 1st doses of Vanc, Cipro, Ceftaz several hours (>8h) prior to reaction.  Pt tolerated Cefepime course previously.  . Crestor [Rosuvastatin Calcium] Other (See Comments)    Reaction:  Muscle weakness   . Lipitor [Atorvastatin] Other (See Comments)    Joint pain  . Pneumococcal Vaccines Other (See Comments)    Per pt's wife, he developed PNA after receiving vaccine.  . Rosuvastatin  Dermatitis and Other (See Comments)    Reaction:  Muscle weakness   . Bactrim Rash  . Gluten Rash and Other (See Comments)    Pts wife states that he can eat some gluten.    . Vancomycin Rash    Red man syndrome. Must infuse slowly. Consider premedication with Benadryl.     REVIEW OF SYSTEMS:  Unable to obtain due to advanced dementia.    PHYSICAL EXAMINATION  GENERAL APPEARANCE: In no acute distress. Normal body habitus SKIN:  Skin is warm and dry.  HEAD: Normal in size and contour. No evidence of trauma EYES: Lids open and close normally. No blepharitis, entropion or ectropion. PERRL. Conjunctivae are clear and sclerae are white. Lenses are without opacity EARS: Pinnae are normal. Patient hears normal voice tunes of the examiner MOUTH and THROAT: Lips are without lesions. Oral mucosa is moist and without lesions. Tongue is normal in shape, size, and color and without lesions NECK: supple, trachea midline, no neck masses, no thyroid tenderness, no thyromegaly LYMPHATICS: no LAN in the neck, no supraclavicular LAN RESPIRATORY: breathing is even & unlabored, BS CTAB; O2 @ 3L/min via [place] CARDIAC: RRR, no murmur,no extra heart sounds, no edema, has right chest pacemaker GI: abdomen soft, normal BS, no masses, no tenderness, no hepatomegaly, no splenomegaly EXTREMITIES:  Able to move X 4 extremities; generalized weakness PSYCHIATRIC: non-verbal; flat affect    LABS/RADIOLOGY: Labs reviewed: Basic Metabolic Panel:  Recent Labs  48/27/07 1001  NA 138  K 4.5  CL 103  CO2 29  GLUCOSE 84  BUN 19  CREATININE 0.93  CALCIUM 9.1   Liver Function Tests:  Recent Labs  09/17/15 1001  AST 25  ALT 10  ALKPHOS 88  BILITOT 0.4  PROT 7.0  ALBUMIN 3.8   CBC:  Recent Labs  09/17/15 1001  WBC 4.5  NEUTROABS 2.0  HGB 12.4*  HCT 39.2  MCV 83.1  PLT 342   Lipid Panel:  Recent Labs  09/17/15 1001  HDL 47       ASSESSMENT/PLAN:  Generalized weakness - for  rehabilitation, PT and OT, for therapeutic strengthening exercises; fall precaution  DBS hardware infection S/P revision - continue Dapsone 25 mg give 2 tabs = 50 mg PO Q D, Keflex 500 mg 1 capsule PO TID, Rifampin 300 mg 1 capsule BID; follow-up with Infectious Disease on 09/01/16; check CBC and BMP  Acute exacerbation of Parkinson's Disease - S/P DBS 2006 and recent revision d/t wire erosion through scalp and associated DBS hardware infection;  Sinemet 25-100 mg CR 1 tab Q HS on hold while patient requiring meds to be crushed; continue Sinemet 25-100 mg 2 tabs PO QID; follow-up with neurology   Dementia - continue Aricept 10 mg 1 tab Q D; for ST evaluation and treatment to address cognitive and communicative decline; continue supportive care  CAD - continue ASA  81 mg 1 tab PO Q D and NTG PRN  Acute systolic heart failure - EF 20%; cardiology consulted and was started on Coreg 3.125 mg 1 tab PO BID and Lisinopril 5 mg 1 tab PO daily and Zetia 10 mg 1 tab PO Q D; follow-up with Dr. Lourena Simmonds, cardiologist  BPH - Flomax 0.4 mg Q HS on hold while patient requiring meds to be crushed  OAB - Myrbetriq 50 mg Tb24  Q D on hold while patient requiring meds to be crushed  Depression - continue Lexapro 10 mg 1 tab PO Q D and Seroquel 25 mg 1 tab PO Q HS  Constipation - continue Senna-S 8.6-50 mg 2 tabs PO Q HS  Dysphagia - for ST evaluation and treatment; aspiration precaution      Goals of care:  Short-term rehabilitation     Kenard Gower, NP [facility] [phone]

## 2016-08-14 NOTE — Progress Notes (Signed)
Patient ID: [name], male   DOB: Jan 10, 1941, 75 y.o.   MRN: [id number]

## 2016-08-15 ENCOUNTER — Non-Acute Institutional Stay (SKILLED_NURSING_FACILITY): Payer: Medicare Other | Admitting: Internal Medicine

## 2016-08-15 ENCOUNTER — Encounter: Payer: Self-pay | Admitting: Internal Medicine

## 2016-08-15 DIAGNOSIS — F329 Major depressive disorder, single episode, unspecified: Secondary | ICD-10-CM

## 2016-08-15 DIAGNOSIS — I10 Essential (primary) hypertension: Secondary | ICD-10-CM

## 2016-08-15 DIAGNOSIS — G20A1 Parkinson's disease without dyskinesia, without mention of fluctuations: Secondary | ICD-10-CM

## 2016-08-15 DIAGNOSIS — G3183 Dementia with Lewy bodies: Secondary | ICD-10-CM

## 2016-08-15 DIAGNOSIS — G20 Parkinson's disease: Secondary | ICD-10-CM

## 2016-08-15 DIAGNOSIS — R1310 Dysphagia, unspecified: Secondary | ICD-10-CM

## 2016-08-15 DIAGNOSIS — N400 Enlarged prostate without lower urinary tract symptoms: Secondary | ICD-10-CM

## 2016-08-15 DIAGNOSIS — F0280 Dementia in other diseases classified elsewhere without behavioral disturbance: Secondary | ICD-10-CM

## 2016-08-15 DIAGNOSIS — I5021 Acute systolic (congestive) heart failure: Secondary | ICD-10-CM | POA: Diagnosis not present

## 2016-08-15 DIAGNOSIS — I2510 Atherosclerotic heart disease of native coronary artery without angina pectoris: Secondary | ICD-10-CM | POA: Diagnosis not present

## 2016-08-15 DIAGNOSIS — I480 Paroxysmal atrial fibrillation: Secondary | ICD-10-CM

## 2016-08-15 DIAGNOSIS — R5381 Other malaise: Secondary | ICD-10-CM | POA: Diagnosis not present

## 2016-08-15 DIAGNOSIS — I2583 Coronary atherosclerosis due to lipid rich plaque: Secondary | ICD-10-CM

## 2016-08-15 DIAGNOSIS — T85190S Other mechanical complication of implanted electronic neurostimulator (electrode) of brain, sequela: Secondary | ICD-10-CM | POA: Diagnosis not present

## 2016-08-15 NOTE — Progress Notes (Signed)
LOCATION: [facility]  PCP: Danise Edge, MD   Code Status: DNR  Goals of care: Advanced Directive information Advanced Directives 08/14/2016  Does patient have an advance directive? Yes  Type of Advance Directive Out of facility DNR (pink MOST or yellow form)  Does patient want to make changes to advanced directive? No - Patient declined  Copy of advanced directive(s) in chart? Yes  Would patient like information on creating an advanced directive? -  Pre-existing out of facility DNR order (yellow form or pink MOST form) -       Extended Emergency Contact Information Primary Emergency Contact: Somers,Elaine Address: [address]          [place], [place] [address] [place] Home Phone: [phone] Mobile Phone: [phone] Relation: Spouse Secondary Emergency Contact: Alphia [place] Home Phone: [phone] Mobile Phone: [phone] Relation: Daughter   Allergies  Allergen Reactions  . Fortaz [Ceftazidime Sodium In D5w] Hives    Questionable reaction to [employer].  Rash on forehead, nose, neck, several whelps all over back/side, possible swelling of lips developed 2 hours after 2nd dose of Ceftaz on 06/05/15. Patient received 1st doses of Vanc, Cipro, Ceftaz several hours (>8h) prior to reaction.  Pt tolerated Cefepime course previously.  . Crestor [Rosuvastatin Calcium] Other (See Comments)    Reaction:  Muscle weakness   . Lipitor [Atorvastatin] Other (See Comments)    Joint pain  . Pneumococcal Vaccines Other (See Comments)    Per pt's wife, he developed PNA after receiving vaccine.  . Rosuvastatin Dermatitis and Other (See Comments)    Reaction:  Muscle weakness   . Bactrim Rash  . Gluten Rash and Other (See Comments)    Pts wife states that he can eat some gluten.    . Vancomycin Rash    Red man syndrome. Must infuse slowly. Consider premedication with Benadryl.    Chief Complaint  Patient presents with  . New  Admit To SNF    New Admission Visit     HPI:  Patient is a 75 y.o. male seen today for short term rehabilitation post hospital admission from 08/06/16-08/13/16 with worsening mental state. It was thought to be from progressive parkinson's disease and possible stroke. He is s/p deep brain stimulator placed on 2006 and recent revision due to wire erosion through scalp and associated DBS hardware infection. He is currently on triple antibiotics. TTE showed an EF of 20% which was new and cardiology was consulted. Medical management was recommended and he was started on coreg and lisinopril. Statin was not started with his hx of myalgia. He is seen in his room today.    Review of Systems: unable to obtain    Past Medical History:  Diagnosis Date  . Atrioventricular block, complete (HCC)   . BPH (benign prostatic hypertrophy)   . Bradycardia    syncope due to prolonged pauses, resolved with PPM  . CAD (coronary artery disease)   . Coronary artery disease 1986   Inferior wall MI treated with Angioplasty  . Depression   . Dermatitis   . 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 Dysphagia   . Dysphagia 03/10/2016  . H/O hiatal hernia   . Herpetic gingivostomatitis 01/19/2015  . History of chicken pox 03/10/2016  . Hypertension   . Infection and inflammatory reaction due to nervous system device, implant, and graft (HCC) 05/15/2015  . Inferior MI (HCC)   . Maxillary fracture (HCC) 2009  Right  . Maxillary fracture (HCC) 2009   Right side  . Medicare annual wellness visit, subsequent 03/23/2016  . Normal echocardiogram 2007   Mild LVH with Impaired relaxation, Mild-Mod. Aortic Root dilation, Mild Aortic Sclerosis, Mild MR, Mild TR, Ef-50-55%  . Normal nuclear stress test 2006   Normal EF-54%  . Olecranon bursitis of left elbow 01/19/2015  . Pacemaker   . Parkinson disease (HCC)   . Posterior vitreous detachment 03/04/2012  . Rib fracture 07/2010  . Rib fractures 2011   S/p fall  . Sleep  apnea    Past Surgical History:  Procedure Laterality Date  . arthroscopic knee     Left knee  . CARDIAC CATHETERIZATION     Multiple Angioplasties, last 2002  . Cataract Surgery     Bilateral  . DEEP BRAIN STIMULATOR PLACEMENT     Parkinsons  . EYE SURGERY    . INGUINAL HERNIA REPAIR     Bilateral  . INSERT / REPLACE / REMOVE PACEMAKER    . INSERTION / PLACEMENT / REVISION NEUROSTIMULATOR    . PACEMAKER INSERTION  10/28/10   MDT implanted by Dr Ladona Ridgel  . removal of ganglion cyst     right wrist   Social History:   reports that he has never smoked. He has never used smokeless tobacco. He reports that he does not drink alcohol or use drugs.  Family History  Problem Relation Age of Onset  . Heart attack Mother   . Heart disease Mother   . Hypertension Mother   . Heart attack Father   . Other Father     renal calculi  . Heart disease Father     5 MI  . Heart disease Brother 38    bypass double  . Hyperlipidemia Brother   . Hypertension Brother   . Heart attack Sister   . Heart disease Sister   . Heart disease Sister     Carita Pian syndrome and septal defect.     Medications:   Medication List       Accurate as of 08/15/16 10:49 AM. Always use your most recent med list.          acetaminophen 325 MG tablet Commonly known as:  TYLENOL Take 650 mg by mouth every 6 (six) hours as needed for fever.   aspirin 81 MG chewable tablet Chew 81 mg by mouth daily.   calcium-vitamin D 500-200 MG-UNIT tablet Take 1 tablet by mouth 2 (two) times daily.   carbidopa-levodopa 25-100 MG tablet Commonly known as:  SINEMET IR Take 2 tablets by mouth 4 (four) times daily. 6AM, 10AM, 2PM, 6PM   carbidopa-levodopa 25-100 MG tablet Commonly known as:  SINEMET IR Take 1 tablet by mouth daily. Take at 2200   carvedilol 3.125 MG tablet Commonly known as:  COREG Take 3.125 mg by mouth 2 (two) times daily with a meal.   cephALEXin 500 MG capsule Commonly known as:   KEFLEX Take 500 mg by mouth 3 (three) times daily.   dapsone 25 MG tablet Take 50 mg by mouth daily. Take 2 tabs to = 50 mg   donepezil 10 MG tablet Commonly known as:  ARICEPT Take 10 mg by mouth daily.   doxazosin 4 MG tablet Commonly known as:  CARDURA Take 4 mg by mouth every evening.   escitalopram 10 MG tablet Commonly known as:  LEXAPRO Take 1 tablet (10 mg total) by mouth daily.   ezetimibe 10 MG tablet Commonly known  as:  ZETIA TAKE 1 TABLET BY MOUTH EVERY DAY   lisinopril 5 MG tablet Commonly known as:  PRINIVIL,ZESTRIL Take 5 mg by mouth daily.   nitroGLYCERIN 0.4 MG SL tablet Commonly known as:  NITROSTAT Place 1 tablet (0.4 mg total) under the tongue every 5 (five) minutes as needed for chest pain.   OXYGEN Inhale 2 L/min into the lungs daily as needed. To maintain sats >90   QUEtiapine 25 MG tablet Commonly known as:  SEROQUEL Take 25 mg by mouth at bedtime.   rifampin 300 MG capsule Commonly known as:  RIFADIN Take 300 mg by mouth 2 (two) times daily.   sennosides-docusate sodium 8.6-50 MG tablet Commonly known as:  SENOKOT-S Take 2 tablets by mouth at bedtime.   vitamin B-12 1000 MCG tablet Commonly known as:  CYANOCOBALAMIN Take 1 tablet (1,000 mcg total) by mouth daily.   vitamin C 500 MG tablet Commonly known as:  ASCORBIC ACID Take 500 mg by mouth 3 (three) times daily.   zinc sulfate 220 (50 Zn) MG capsule Take 220 mg by mouth daily.       Immunizations: Immunization History  Administered Date(s) Administered  . Influenza, High Dose Seasonal PF 08/14/2015  . Influenza-Unspecified 08/14/2015  . Tdap 03/10/2016  . Zoster 08/22/2011     Physical Exam:  Vitals:   08/15/16 1042  BP: 108/60  Pulse: 60  Resp: 20  Temp: 98.2 F (36.8 C)  TempSrc: Oral  Weight: 165 lb 4.8 oz (75 kg)  Height: 5\' 11"  (1.803 m)   Body mass index is 23.05 kg/m.  General- elderly male, frail and thin built, chronically ill appearing, in no  acute distress Head- normocephalic, atraumatic Nose- no nasal discharge Throat- moist mucus membrane Eyes- PERRLA, EOMI, no pallor, no icterus, no discharge, normal conjunctiva, normal sclera Neck- no cervical lymphadenopathy Cardiovascular- normal s1,s2, no murmur, no leg edema Respiratory- bilateral clear to auscultation, no wheeze, no rhonchi, no crackles, no use of accessory muscles Abdomen- bowel sounds present, soft, non tender Musculoskeletal- able to move all 4 extremities, spasticity present, on wheelchair, has tremors to his hands Neurological- alert, tracks examiner, does not participate in conversation Skin- warm and dry, left posterior scalp incision healing well   Labs reviewed: Basic Metabolic Panel:  Recent Labs  44/92/01 1001 08/06/16  NA 138 143  K 4.5 4.3  CL 103  --   CO2 29  --   GLUCOSE 84  --   BUN 19 19  CREATININE 0.93 0.9  CALCIUM 9.1  --    Liver Function Tests:  Recent Labs  09/17/15 1001 08/06/16  AST 25 28  ALT 10 8*  ALKPHOS 88 66  BILITOT 0.4  --   PROT 7.0  --   ALBUMIN 3.8  --    No results for input(s): LIPASE, AMYLASE in the last 8760 hours. No results for input(s): AMMONIA in the last 8760 hours. CBC:  Recent Labs  09/17/15 1001 08/06/16  WBC 4.5 7.0  NEUTROABS 2.0 4  HGB 12.4* 13.8  HCT 39.2 43  MCV 83.1  --   PLT 342 298     Assessment/Plan  Physical deconditioning Will have him work with physical therapy and occupational therapy team to help with gait training and muscle strengthening exercises.fall precautions. Skin care. Encourage to be out of bed.   Parkinson's disease Continue sinemet IR 25-100 2 tab qid and 1 tab at bedtime  DBS hardware infection  S/P revision. Monitor for fever. Continue  keflex 500 mg tid, rifampin 300 mg bid and dapsone 50 mg daily. To follow with ID  Systolic CHF Acute with drop in EF to 20%. Continue coreg 3.125 mg bid and lisinopril 5 mg daily. Continue o2 by nasal  canula  Dysphagia Continue SLP to follow, dysphagia diet, full assistance with feed and aspiration precautions  BPH Continue doxazosin 4 mg daily  HTN Continue lisinopril 5 mg daily and coreg 3.125 mg bid.   Dementia  Continue supportive care. continue aricept 10 mg daily  Depression Stable, continue quetiapine 25 mg qhs and escitalopram 10 mg daily  CAD Monitor, continue coreg, lisinopril, aspirin and prn NTG    Goals of care: short term rehabilitation   Labs/tests ordered: cbc, cmp  Family/ staff Communication: reviewed care plan with patient's nursing supervisor    Oneal GroutMAHIMA Artemisa Sladek, MD Internal Medicine [facility] [address] [place], [place] [address] Cell Phone (Monday-Friday 8 am - 5 pm): [phone] On Call: [phone] and follow prompts after 5 pm and on weekends Office Phone: [phone] Office Fax: [phone]

## 2016-08-24 ENCOUNTER — Inpatient Hospital Stay (HOSPITAL_COMMUNITY)
Admission: EM | Admit: 2016-08-24 | Discharge: 2016-09-10 | DRG: 871 | Disposition: E | Payer: Medicare Other | Attending: Family Medicine | Admitting: Family Medicine

## 2016-08-24 ENCOUNTER — Emergency Department (HOSPITAL_COMMUNITY): Payer: Medicare Other

## 2016-08-24 ENCOUNTER — Encounter (HOSPITAL_COMMUNITY): Payer: Self-pay

## 2016-08-24 DIAGNOSIS — Z7982 Long term (current) use of aspirin: Secondary | ICD-10-CM | POA: Diagnosis not present

## 2016-08-24 DIAGNOSIS — Z950 Presence of cardiac pacemaker: Secondary | ICD-10-CM | POA: Diagnosis not present

## 2016-08-24 DIAGNOSIS — L130 Dermatitis herpetiformis: Secondary | ICD-10-CM | POA: Diagnosis present

## 2016-08-24 DIAGNOSIS — E870 Hyperosmolality and hypernatremia: Secondary | ICD-10-CM | POA: Diagnosis present

## 2016-08-24 DIAGNOSIS — N179 Acute kidney failure, unspecified: Secondary | ICD-10-CM | POA: Diagnosis present

## 2016-08-24 DIAGNOSIS — E873 Alkalosis: Secondary | ICD-10-CM | POA: Diagnosis present

## 2016-08-24 DIAGNOSIS — Z887 Allergy status to serum and vaccine status: Secondary | ICD-10-CM

## 2016-08-24 DIAGNOSIS — Z881 Allergy status to other antibiotic agents status: Secondary | ICD-10-CM | POA: Diagnosis not present

## 2016-08-24 DIAGNOSIS — I110 Hypertensive heart disease with heart failure: Secondary | ICD-10-CM | POA: Diagnosis present

## 2016-08-24 DIAGNOSIS — J690 Pneumonitis due to inhalation of food and vomit: Secondary | ICD-10-CM | POA: Diagnosis present

## 2016-08-24 DIAGNOSIS — I252 Old myocardial infarction: Secondary | ICD-10-CM | POA: Diagnosis not present

## 2016-08-24 DIAGNOSIS — Z888 Allergy status to other drugs, medicaments and biological substances status: Secondary | ICD-10-CM | POA: Diagnosis not present

## 2016-08-24 DIAGNOSIS — Z66 Do not resuscitate: Secondary | ICD-10-CM | POA: Diagnosis present

## 2016-08-24 DIAGNOSIS — R0902 Hypoxemia: Secondary | ICD-10-CM | POA: Diagnosis present

## 2016-08-24 DIAGNOSIS — F329 Major depressive disorder, single episode, unspecified: Secondary | ICD-10-CM | POA: Diagnosis present

## 2016-08-24 DIAGNOSIS — N400 Enlarged prostate without lower urinary tract symptoms: Secondary | ICD-10-CM | POA: Diagnosis present

## 2016-08-24 DIAGNOSIS — A419 Sepsis, unspecified organism: Secondary | ICD-10-CM

## 2016-08-24 DIAGNOSIS — Z9861 Coronary angioplasty status: Secondary | ICD-10-CM | POA: Diagnosis not present

## 2016-08-24 DIAGNOSIS — J9601 Acute respiratory failure with hypoxia: Secondary | ICD-10-CM | POA: Diagnosis present

## 2016-08-24 DIAGNOSIS — Z515 Encounter for palliative care: Secondary | ICD-10-CM | POA: Diagnosis present

## 2016-08-24 DIAGNOSIS — E860 Dehydration: Secondary | ICD-10-CM | POA: Diagnosis present

## 2016-08-24 DIAGNOSIS — Z8249 Family history of ischemic heart disease and other diseases of the circulatory system: Secondary | ICD-10-CM

## 2016-08-24 DIAGNOSIS — R0603 Acute respiratory distress: Secondary | ICD-10-CM | POA: Diagnosis present

## 2016-08-24 DIAGNOSIS — G20A1 Parkinson's disease without dyskinesia, without mention of fluctuations: Secondary | ICD-10-CM | POA: Diagnosis present

## 2016-08-24 DIAGNOSIS — I2510 Atherosclerotic heart disease of native coronary artery without angina pectoris: Secondary | ICD-10-CM | POA: Diagnosis present

## 2016-08-24 DIAGNOSIS — G20 Parkinson's disease: Secondary | ICD-10-CM | POA: Diagnosis present

## 2016-08-24 DIAGNOSIS — R1310 Dysphagia, unspecified: Secondary | ICD-10-CM

## 2016-08-24 DIAGNOSIS — I5022 Chronic systolic (congestive) heart failure: Secondary | ICD-10-CM | POA: Diagnosis present

## 2016-08-24 LAB — CBC WITH DIFFERENTIAL/PLATELET
BASOS PCT: 0 %
Basophils Absolute: 0.1 10*3/uL (ref 0.0–0.1)
EOS ABS: 0 10*3/uL (ref 0.0–0.7)
Eosinophils Relative: 0 %
HEMATOCRIT: 51.4 % (ref 39.0–52.0)
HEMOGLOBIN: 16.2 g/dL (ref 13.0–17.0)
LYMPHS ABS: 1.2 10*3/uL (ref 0.7–4.0)
Lymphocytes Relative: 7 %
MCH: 27.7 pg (ref 26.0–34.0)
MCHC: 31.5 g/dL (ref 30.0–36.0)
MCV: 88 fL (ref 78.0–100.0)
MONO ABS: 1.1 10*3/uL — AB (ref 0.1–1.0)
MONOS PCT: 7 %
NEUTROS ABS: 14 10*3/uL — AB (ref 1.7–7.7)
NEUTROS PCT: 86 %
Platelets: 383 10*3/uL (ref 150–400)
RBC: 5.84 MIL/uL — ABNORMAL HIGH (ref 4.22–5.81)
RDW: 18.5 % — AB (ref 11.5–15.5)
WBC: 16.3 10*3/uL — ABNORMAL HIGH (ref 4.0–10.5)

## 2016-08-24 LAB — I-STAT CG4 LACTIC ACID, ED: Lactic Acid, Venous: 2.16 mmol/L (ref 0.5–1.9)

## 2016-08-24 LAB — COMPREHENSIVE METABOLIC PANEL
ALBUMIN: 3.2 g/dL — AB (ref 3.5–5.0)
ALK PHOS: 99 U/L (ref 38–126)
ALT: 33 U/L (ref 17–63)
AST: 37 U/L (ref 15–41)
Anion gap: 11 (ref 5–15)
BUN: 42 mg/dL — ABNORMAL HIGH (ref 6–20)
CALCIUM: 9 mg/dL (ref 8.9–10.3)
CHLORIDE: 121 mmol/L — AB (ref 101–111)
CO2: 23 mmol/L (ref 22–32)
CREATININE: 1.69 mg/dL — AB (ref 0.61–1.24)
GFR calc Af Amer: 43 mL/min — ABNORMAL LOW (ref >60)
GFR calc non Af Amer: 37 mL/min — ABNORMAL LOW (ref >60)
GLUCOSE: 141 mg/dL — AB (ref 65–99)
Potassium: 4.1 mmol/L (ref 3.5–5.1)
SODIUM: 155 mmol/L — AB (ref 135–145)
Total Bilirubin: 0.9 mg/dL (ref 0.3–1.2)
Total Protein: 7.6 g/dL (ref 6.5–8.1)

## 2016-08-24 LAB — I-STAT ARTERIAL BLOOD GAS, ED
ACID-BASE EXCESS: 2 mmol/L (ref 0.0–2.0)
Bicarbonate: 24.3 mmol/L (ref 20.0–28.0)
O2 SAT: 99 %
PCO2 ART: 30.1 mmHg — AB (ref 32.0–48.0)
TCO2: 25 mmol/L (ref 0–100)
pH, Arterial: 7.515 — ABNORMAL HIGH (ref 7.350–7.450)
pO2, Arterial: 112 mmHg — ABNORMAL HIGH (ref 83.0–108.0)

## 2016-08-24 MED ORDER — SODIUM CHLORIDE 0.9 % IV SOLN
1.0000 mg/h | INTRAVENOUS | Status: DC
  Administered 2016-08-24: 1 mg/h via INTRAVENOUS
  Administered 2016-08-24: 2 mg/h via INTRAVENOUS
  Filled 2016-08-24: qty 10
  Filled 2016-08-24: qty 5

## 2016-08-24 MED ORDER — LORAZEPAM 2 MG/ML IJ SOLN: 2.0000 mg | INTRAMUSCULAR | Status: DC | PRN

## 2016-08-24 MED ORDER — ACETAMINOPHEN 325 MG PO TABS: 650.0000 mg | ORAL_TABLET | Freq: Four times a day (QID) | ORAL | Status: DC | PRN

## 2016-08-24 MED ORDER — SODIUM CHLORIDE 0.9 % IV SOLN
INTRAVENOUS | Status: DC
  Administered 2016-08-24: 13:00:00 via INTRAVENOUS

## 2016-08-24 MED ORDER — POLYVINYL ALCOHOL 1.4 % OP SOLN
1.0000 [drp] | Freq: Four times a day (QID) | OPHTHALMIC | Status: DC | PRN
  Filled 2016-08-24: qty 15

## 2016-08-24 MED ORDER — VANCOMYCIN HCL IN DEXTROSE 1-5 GM/200ML-% IV SOLN
1000.0000 mg | Freq: Once | INTRAVENOUS | Status: DC
  Filled 2016-08-24: qty 200

## 2016-08-24 MED ORDER — GLYCOPYRROLATE 0.2 MG/ML IJ SOLN
0.2000 mg | INTRAMUSCULAR | Status: DC | PRN
  Administered 2016-08-24: 0.2 mg via INTRAVENOUS
  Filled 2016-08-24: qty 1

## 2016-08-24 MED ORDER — VANCOMYCIN HCL IN DEXTROSE 1-5 GM/200ML-% IV SOLN
1000.0000 mg | Freq: Two times a day (BID) | INTRAVENOUS | Status: DC
Start: 2016-08-24 — End: 2016-08-24

## 2016-08-24 MED ORDER — MORPHINE SULFATE (PF) 4 MG/ML IV SOLN: 4.0000 mg | INTRAVENOUS | Status: DC | PRN

## 2016-08-24 MED ORDER — PIPERACILLIN-TAZOBACTAM 3.375 G IVPB 30 MIN
3.3750 g | Freq: Once | INTRAVENOUS | Status: AC
  Administered 2016-08-24: 3.375 g via INTRAVENOUS
  Filled 2016-08-24: qty 50

## 2016-08-24 MED ORDER — BIOTENE DRY MOUTH MT LIQD: 15.0000 mL | OROMUCOSAL | Status: DC | PRN

## 2016-08-24 MED ORDER — GLYCOPYRROLATE 1 MG PO TABS
1.0000 mg | ORAL_TABLET | ORAL | Status: DC | PRN
  Filled 2016-08-24: qty 1

## 2016-08-24 MED ORDER — GLYCOPYRROLATE 0.2 MG/ML IJ SOLN: 0.2000 mg | INTRAMUSCULAR | Status: DC | PRN

## 2016-08-24 MED ORDER — VANCOMYCIN HCL 10 G IV SOLR
1500.0000 mg | Freq: Once | INTRAVENOUS | Status: DC
  Filled 2016-08-24: qty 1500

## 2016-08-24 MED ORDER — MORPHINE SULFATE (PF) 2 MG/ML IV SOLN
1.0000 mg | INTRAVENOUS | Status: DC | PRN
  Administered 2016-08-24: 1 mg via INTRAVENOUS
  Filled 2016-08-24: qty 1

## 2016-08-24 MED ORDER — SODIUM CHLORIDE 0.9 % IV BOLUS (SEPSIS): 1000.0000 mL | Freq: Once | INTRAVENOUS | Status: DC

## 2016-08-24 MED ORDER — MORPHINE INJECTION FOR INHALATION 10 MG/ML
10.0000 mg | RESPIRATORY_TRACT | Status: DC | PRN
Start: 2016-08-24 — End: 2016-08-24

## 2016-08-24 MED ORDER — SODIUM CHLORIDE 0.9 % IV BOLUS (SEPSIS)
1000.0000 mL | Freq: Once | INTRAVENOUS | Status: AC
  Administered 2016-08-24: 1000 mL via INTRAVENOUS

## 2016-08-24 MED ORDER — PIPERACILLIN-TAZOBACTAM 3.375 G IVPB: 3.3750 g | Freq: Three times a day (TID) | INTRAVENOUS | Status: DC

## 2016-08-24 MED ORDER — ACETAMINOPHEN 650 MG RE SUPP
650.0000 mg | Freq: Four times a day (QID) | RECTAL | Status: DC | PRN
  Administered 2016-08-24: 650 mg via RECTAL
  Filled 2016-08-24: qty 1

## 2016-08-26 ENCOUNTER — Telehealth: Payer: Self-pay | Admitting: Family Medicine

## 2016-08-29 LAB — CULTURE, BLOOD (ROUTINE X 2)
CULTURE: NO GROWTH
Culture: NO GROWTH

## 2016-09-05 NOTE — Telephone Encounter (Signed)
Error

## 2016-09-10 NOTE — ED Notes (Signed)
Family at bedside. 

## 2016-09-10 NOTE — ED Notes (Signed)
Phlebotomy at bedside.

## 2016-09-10 NOTE — ED Notes (Signed)
Attempted to call report

## 2016-09-10 NOTE — Progress Notes (Signed)
Pharmacy Antibiotic Note  [name] is a 75 y.o. male admitted on 2016/09/06 with pneumonia and sepsis.  Pt presented unresponsive and in respiratory distress. Pharmacy has been consulted for Vancomycin and Zosyn dosing. Vancomycin 1gm ordered in ED, changed to 1.5gm for proper loading dose based on total body weight (71.8kg). No current labs available, used SCr from 9/28 (CrCl ~70) for maintenance doses. Of note, pt has prior history of Red Man's Syndrome, counseled nurse to administer infusion slowly over 2-3 hours. In addition, pt has questionable history of allergic reaction to Ceftaz per notes, but has previously tolerated other cephalosporins well.  Plan: -Vancomycin 1.5gm x1 -Zosyn 3.375gm x1 -Vancomycin 1gm q12h -Zosyn 3.375gm q8h EI -Follow-up SCr, cultures, S/Sx infection, and clinical course  Height: 5\' 11"  (180.3 cm) Weight: 158 lb 6.4 oz (71.8 kg) IBW/kg (Calculated) : 75.3  Temp (24hrs), Avg:103.4 F (39.7 C), Min:103.4 F (39.7 C), Max:103.4 F (39.7 C)  No results for input(s): WBC, CREATININE, LATICACIDVEN, VANCOTROUGH, VANCOPEAK, VANCORANDOM, GENTTROUGH, GENTPEAK, GENTRANDOM, TOBRATROUGH, TOBRAPEAK, TOBRARND, AMIKACINPEAK, AMIKACINTROU, AMIKACIN in the last 168 hours.  Estimated Creatinine Clearance: 69.9 mL/min (by C-G formula based on SCr of 0.9 mg/dL).    Allergies  Allergen Reactions  . Fortaz [Ceftazidime Sodium In D5w] Hives    Questionable reaction to [employer].  Rash on forehead, nose, neck, several whelps all over back/side, possible swelling of lips developed 2 hours after 2nd dose of Ceftaz on 06/05/15. Patient received 1st doses of Vanc, Cipro, Ceftaz several hours (>8h) prior to reaction.  Pt tolerated Cefepime course previously.  . Crestor [Rosuvastatin Calcium] Other (See Comments)    Reaction:  Muscle weakness   . Lipitor [Atorvastatin] Other (See Comments)    Joint pain  . Pneumococcal Vaccines Other (See Comments)    Per pt's wife, he developed  PNA after receiving vaccine.  . Rosuvastatin Dermatitis and Other (See Comments)    Reaction:  Muscle weakness   . Bactrim Rash  . Gluten Rash and Other (See Comments)    Pts wife states that he can eat some gluten.    . Vancomycin Rash    Red man syndrome. Must infuse slowly. Consider premedication with Benadryl.    Antimicrobials this admission: 10/15 Vancomycin >>  10/15 Zosyn >>   Dose adjustments this admission: none  Microbiology results: none  Thank you for allowing pharmacy to be a part of this patient's care.  Fredonia Highland, PharmD PGY-1 Pharmacy Resident Pager: [phone] September 06, 2016

## 2016-09-10 NOTE — ED Notes (Signed)
Respiratory at bedside.

## 2016-09-10 NOTE — Progress Notes (Signed)
Patient's daughter was upset that patient was not on a cardiac monitor. Explained to daughter that as part of palliative care, cardiac monitor is not routinely ordered. Patient's daughter was still upset and wanted cardiac monitor for family's comfort. She stated she asked for a cardiac monitor earlier today, and she assumed he already had one on. Per family request, cardiac monitor was placed on patient. Patient had already passed. Death confirmed by two RNs. Physician notified. Daughter had questions concerning preparation of patient; she did not want to leave the patient alone. I explained to daughter the patient will need to be prepared before moving the patient to the morgue, and that we normally begin this process once the family leaves. I explained to the daughter and the family that they were more than welcome to wait at the waiting area near the nurses' station during preparation. The daughter still felt some concerns about leaving the patient alone. I explained to her that we would need to place the patient in a bag before taking the patient to the morgue, to which she stated she did not want to witness. I told the family they could take as long as they needed and to let us know when they were ready.

## 2016-09-10 NOTE — ED Provider Notes (Signed)
[facility]-EMERGENCY DEPT Provider Note   CSN: [id number] Arrival date & time: 09/07/16  7829     History   Chief Complaint No chief complaint on file.   HPI [name] is a 75 y.o. male.   Level V caveat for acuity of condition. Patient is in respiratory distress and unresponsive. Brought in by EMS from nursing facility with a one-day history of fever, respiratory distress and altered mental status. Patient discharged from [facility] on October 4. 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 EMS reports fever to 102 with increased respiratory rate and decreased mental status. Last seen normal was last night. Patient unable to give any history. DNR paperwork in place.   The history is provided by the patient, the EMS personnel and a relative. The history is limited by the condition of the patient.    Past Medical History:  Diagnosis Date  . Atrioventricular block, complete (HCC)   . BPH (benign prostatic hypertrophy)   . Bradycardia    syncope due to prolonged pauses, resolved with PPM  . CAD (coronary artery disease)   . Coronary artery disease 1986   Inferior wall MI treated with Angioplasty  . Depression   . Dermatitis   . 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 Dysphagia   . Dysphagia 03/10/2016  . H/O hiatal hernia   . Herpetic gingivostomatitis 01/19/2015  . History of chicken pox 03/10/2016  . Hypertension   . Infection and inflammatory reaction due to nervous system device, implant, and graft (HCC) 05/15/2015  . Inferior MI (HCC)   . Maxillary fracture (HCC) 2009   Right  . Maxillary fracture (HCC) 2009   Right side  . Medicare annual wellness visit, subsequent 03/23/2016  . Normal echocardiogram 2007   Mild LVH with Impaired relaxation, Mild-Mod. Aortic Root dilation, Mild Aortic Sclerosis, Mild MR, Mild TR, Ef-50-55%  . Normal nuclear stress test 2006   Normal EF-54%  . Olecranon bursitis of  left elbow 01/19/2015  . Pacemaker   . Parkinson disease (HCC)   . Posterior vitreous detachment 03/04/2012  . Rib fracture 07/2010  . Rib fractures 2011   S/p fall  . Sleep apnea     Patient Active Problem List   Diagnosis Date Noted  . Medicare annual wellness visit, subsequent 03/23/2016  . Dysphagia 03/10/2016  . History of chicken pox 03/10/2016  . Prolonged Q-T interval on ECG 06/06/2015  . Allergic reaction 06/05/2015  . HCAP (healthcare-associated pneumonia) 06/04/2015  . Encephalopathy, metabolic 06/04/2015  . 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 Cognitive decline 05/07/2015  . Aspiration pneumonia (HCC) 01/21/2015  . Healed myocardial infarct 08/23/2013  . Obstructive apnea 08/23/2013  . B12 deficiency 02/03/2013  . Idiopathic Parkinson's disease (HCC) 08/29/2011  . Hyperlipidemia 08/22/2011  . CAD (coronary artery disease) 08/22/2011  . Dementia 08/22/2011  . Atrial fibrillation (HCC) 01/21/2011  . Cardiac pacemaker in situ 01/21/2011    Past Surgical History:  Procedure Laterality Date  . arthroscopic knee     Left knee  . CARDIAC CATHETERIZATION     Multiple Angioplasties, last 2002  . Cataract Surgery     Bilateral  . DEEP BRAIN STIMULATOR PLACEMENT     Parkinsons  . EYE SURGERY    . INGUINAL HERNIA REPAIR     Bilateral  . INSERT / REPLACE / REMOVE PACEMAKER    . INSERTION / PLACEMENT /  REVISION NEUROSTIMULATOR    . PACEMAKER INSERTION  10/28/10   MDT implanted by Dr Ladona Ridgel  . removal of ganglion cyst     right wrist       Home Medications    Prior to Admission medications   Medication Sig Start Date End Date Taking? Authorizing Provider  acetaminophen (TYLENOL) 325 MG tablet Take 650 mg by mouth every 6 (six) hours as needed for fever.    Historical Provider, MD  aspirin 81 MG chewable tablet Chew 81 mg by mouth daily.    Historical Provider, MD  Calcium Carbonate-Vitamin D (CALCIUM-VITAMIN D) 500-200 MG-UNIT tablet Take  1 tablet by mouth 2 (two) times daily.    Historical Provider, MD  carbidopa-levodopa (SINEMET IR) 25-100 MG tablet Take 2 tablets by mouth 4 (four) times daily. 6AM, 10AM, 2PM, 6PM    Historical Provider, MD  carbidopa-levodopa (SINEMET IR) 25-100 MG tablet Take 1 tablet by mouth daily. Take at 2200    Historical Provider, MD  carvedilol (COREG) 3.125 MG tablet Take 3.125 mg by mouth 2 (two) times daily with a meal.    Historical Provider, MD  cephALEXin (KEFLEX) 500 MG capsule Take 500 mg by mouth 3 (three) times daily.    Historical Provider, MD  dapsone 25 MG tablet Take 50 mg by mouth daily. Take 2 tabs to = 50 mg    Historical Provider, MD  donepezil (ARICEPT) 10 MG tablet Take 10 mg by mouth daily.     Historical Provider, MD  doxazosin (CARDURA) 4 MG tablet Take 4 mg by mouth every evening.    Historical Provider, MD  escitalopram (LEXAPRO) 10 MG tablet Take 1 tablet (10 mg total) by mouth daily. 05/24/14   Mcarthur Rossetti Angiulli, PA-C  ezetimibe (ZETIA) 10 MG tablet TAKE 1 TABLET BY MOUTH EVERY DAY 06/16/16   Marinus Maw, MD  lisinopril (PRINIVIL,ZESTRIL) 5 MG tablet Take 5 mg by mouth daily.    Historical Provider, MD  nitroGLYCERIN (NITROSTAT) 0.4 MG SL tablet Place 1 tablet (0.4 mg total) under the tongue every 5 (five) minutes as needed for chest pain. 09/17/15   Cassell Clement, MD  OXYGEN Inhale 2 L/min into the lungs daily as needed. To maintain sats >90    Historical Provider, MD  QUEtiapine (SEROQUEL) 25 MG tablet Take 25 mg by mouth at bedtime.    Historical Provider, MD  rifampin (RIFADIN) 300 MG capsule Take 300 mg by mouth 2 (two) times daily.    Historical Provider, MD  sennosides-docusate sodium (SENOKOT-S) 8.6-50 MG tablet Take 2 tablets by mouth at bedtime.    Historical Provider, MD  vitamin B-12 (CYANOCOBALAMIN) 1000 MCG tablet Take 1 tablet (1,000 mcg total) by mouth daily. 05/24/14   Mcarthur Rossetti Angiulli, PA-C  vitamin C (ASCORBIC ACID) 500 MG tablet Take 500 mg by mouth 3  (three) times daily.     Historical Provider, MD  zinc sulfate 220 (50 Zn) MG capsule Take 220 mg by mouth daily.    Historical Provider, MD    Family History Family History  Problem Relation Age of Onset  . Heart attack Mother   . Heart disease Mother   . Hypertension Mother   . Heart attack Father   . Other Father     renal calculi  . Heart disease Father     5 MI  . Heart disease Brother 38    bypass double  . Hyperlipidemia Brother   . Hypertension Brother   . Heart attack  Sister   . Heart disease Sister   . Heart disease Sister     Carita Pian syndrome and septal defect.     Social History Social History  Substance Use Topics  . Smoking status: Never Smoker  . Smokeless tobacco: Never Used  . Alcohol use No     Allergies   Fortaz [ceftazidime sodium in d5w]; Crestor [rosuvastatin calcium]; Lipitor [atorvastatin]; Pneumococcal vaccines; Rosuvastatin; Bactrim; Gluten; and Vancomycin   Review of Systems Review of Systems  Unable to perform ROS: Acuity of condition     Physical Exam Updated Vital Signs BP 100/66 (BP Location: Right Arm)   Pulse 71   Temp 99.3 F (37.4 C) (Axillary)   Resp (!) 28   Ht 5\' 11"  (1.803 m)   Wt 158 lb 6.4 oz (71.8 kg)   SpO2 (!) 89%   BMI 22.09 kg/m   Physical Exam  Constitutional: He appears distressed.  Cachectic and chronically ill-appearing, increased work of breathing  HENT:  Head: Normocephalic and atraumatic.  There is orange food and pill fragments in patient's mouth was suction  Palpable DBS in scalp.  Eyes: Conjunctivae and EOM are normal. Pupils are equal, round, and reactive to light.  Cardiovascular: Normal heart sounds.   No murmur heard. tachycardic  Pulmonary/Chest:  Tachypnea.  Diffuse rhonchi throughout  Abdominal: Soft. He exhibits no mass. There is no tenderness. There is no guarding.  Palpable DBS in abdomen  Musculoskeletal:  Patient able to range major joints  Neurological:  Unresponsive,  doesn't follow commands, moves extremities spontaneously  Skin: He is diaphoretic.     ED Treatments / Results  Labs (all labs ordered are listed, but only abnormal results are displayed) Labs Reviewed  COMPREHENSIVE METABOLIC PANEL - Abnormal; Notable for the following:       Result Value   Sodium 155 (*)    Chloride 121 (*)    Glucose, Bld 141 (*)    BUN 42 (*)    Creatinine, Ser 1.69 (*)    Albumin 3.2 (*)    GFR calc non Af Amer 37 (*)    GFR calc Af Amer 43 (*)    All other components within normal limits  CBC WITH DIFFERENTIAL/PLATELET - Abnormal; Notable for the following:    WBC 16.3 (*)    RBC 5.84 (*)    RDW 18.5 (*)    Neutro Abs 14.0 (*)    Monocytes Absolute 1.1 (*)    All other components within normal limits  I-STAT CG4 LACTIC ACID, ED - Abnormal; Notable for the following:    Lactic Acid, Venous 2.16 (*)    All other components within normal limits  I-STAT ARTERIAL BLOOD GAS, ED - Abnormal; Notable for the following:    pH, Arterial 7.515 (*)    pCO2 arterial 30.1 (*)    pO2, Arterial 112.0 (*)    All other components within normal limits  CULTURE, BLOOD (ROUTINE X 2)  CULTURE, BLOOD (ROUTINE X 2)    EKG  EKG Interpretation  Date/Time:  Sunday 2016/09/01 12:10:27 EDT Ventricular Rate:  77 PR Interval:    QRS Duration: 124 QT Interval:  399 QTC Calculation: 452 R Axis:   -52 Text Interpretation:  Sinus or ectopic atrial rhythm Left bundle branch block Baseline wander in lead(s) V1 pacer spikes not seen Confirmed by Manus Gunning  MD, Kaedynce Tapp [phone]) on 2016/09/01 12:18:17 PM       Radiology Dg Chest Port 1 View  Result Date: 01-Sep-2016  CLINICAL DATA:  Fever, hypertension EXAM: PORTABLE CHEST 1 VIEW COMPARISON:  Chest x-ray dated 07/29/2015. FINDINGS: Mild cardiomegaly is stable. Overall cardiomediastinal silhouette appears stable in size and configuration. Right chest wall pacemaker leads appear intact and stable in position. Lungs are clear. No  pleural effusion or pneumothorax seen. Osseous structures about the chest are unremarkable. IMPRESSION: No active disease.  No evidence of pneumonia or pulmonary edema. Electronically Signed   By: Bary RichardStan  Maynard M.D.   On: 11-19-15 10:09    Procedures Procedures (including critical care time)  Medications Ordered in ED Medications  sodium chloride 0.9 % bolus 1,000 mL (not administered)  piperacillin-tazobactam (ZOSYN) IVPB 3.375 g (not administered)  vancomycin (VANCOCIN) IVPB 1000 mg/200 mL premix (not administered)     Initial Impression / Assessment and Plan / ED Course  I have reviewed the triage vital signs and the nursing notes.  Pertinent labs & imaging results that were available during my care of the patient were reviewed by me and considered in my medical decision making (see chart for details).  Clinical Course   Patient brought from nursing home with altered mental status and respiratory distress. Record review shows patient discharged from [facility] on October 4. He is known to have infection of the brain stimulator. Patient also has newly diagnosed heart failure with EF of 20%. Discussed with patient's wife [name] on arrival. She confirms DO NOT RESUSCITATE and DO NOT INTUBATE.  Antibiotic choices d/w pharmacy.  Zosyn recommended for possible aspiration despite questionable cephalosporin allergy.  ABG shows no CO2 retention but does show hyperventilation  Chest x-ray appears quite clear without obvious infiltrate. Family states patient does cough when he eats despite his dysphagia Diet. We'll cover for possible aspiration with Zosyn.  Patient with sepsis, probable recurrent aspiration and recent infection of DBS.  He appears to be at the end stage of his chronic disease process.  Family is reasonable with their expectations. They agree comfort should be the goal.  Admission d/w NP Rennis HardingEllis.  Palliative medicine will be consulted.  CRITICAL CARE Performed by: Glynn OctaveANCOUR,  Aubriegh Minch Total critical care time: 35 minutes Critical care time was exclusive of separately billable procedures and treating other patients. Critical care was necessary to treat or prevent imminent or life-threatening deterioration. Critical care was time spent personally by me on the following activities: development of treatment plan with patient and/or surrogate as well as nursing, discussions with consultants, evaluation of patient's response to treatment, examination of patient, obtaining history from patient or surrogate, ordering and performing treatments and interventions, ordering and review of laboratory studies, ordering and review of radiographic studies, pulse oximetry and re-evaluation of patient's condition.     Final Clinical Impressions(s) / ED Diagnoses   Final diagnoses:  Sepsis, due to unspecified organism [facility](HCC)  Hypernatremia    New Prescriptions New Prescriptions   No medications on file     Glynn OctaveStephen Yisroel Mullendore, MD 05/11/2016 2225

## 2016-09-10 NOTE — ED Notes (Signed)
Chaplin at bedside with the pts wife.

## 2016-09-10 NOTE — ED Notes (Signed)
Pts family at bedside

## 2016-09-10 NOTE — ED Notes (Signed)
Per [facility]: the pt is coming from [facility]. The pt is coming in due to altered mental status and fever. The staff stated that the temperature was 102.7, they gave 650 mg of Tylenol suppository at 0845. The pt was 88% on 2 L [place], with EMS they placced the pt on 15 L NRB and the oxygen saturation increased to 93%. The pt recently had surgery for his deep brain stimulator, two weeks ago, he has a known infection here and has been taking keflex for it. Pt has a hx of dysphagia and parkinsons. The pt is no longer talking or walking, per the wife this is his new normal as of two weeks ago. Pt has only been responsive to painful stimuli. EMS states that the pt has "coarse rhonchi" in all lung fields.

## 2016-09-10 NOTE — ED Notes (Signed)
Attempted 3 IV's on the pt with no success.

## 2016-09-10 NOTE — Progress Notes (Signed)
Wasted of morphine drip. Witnessed by Leonie Man RN.

## 2016-09-10 NOTE — H&P (Signed)
History and Physical    [name] ZOX:[id number] DOB: 09/23/1941 DOA: 08-25-2016   PCP: [name] Edge, MD   Patient coming from/Resides with: Nursing facility  Admission status: Inpatient/palliative -medically necessary to stay a minimum 2 midnights to rule out impending and/or unexpected changes in physiologic status that may differ from initial evaluation performed in the ER and/or at time of admission. Patient presents in a declined physical state with findings consistent with active dying process. Known end-stage Parkinson's, severe dysphagia with recurrent aspiration who presents with fever and hypoxemia, acute kidney injury and altered mentation. Patient has been made comfort measures upon evaluation by the admitting team and will require IV narcotics and anxiolytic medications for symptom management as well as inhaled morphine for symptom management. I have asked palliative medicine to assist with family support and to address discharge disposition in the event the dying process is slow enough to preclude inpatient death.  Chief Complaint: Altered mentation, fever and hypoxia  HPI: [name] is a 75 y.o. 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 Patient was hospitalized at [name] from 9/27 to 10/4 secondary to altered mentation. He was found to have a Staphylococcus lugdunesis DBS hardware infection. He underwent removal of the DBS and was discharged on Keflex, dapsone and rifampin. During the hospitalization he had angioedema from vancomycin. Echocardiogram that admission revealed a new systolic dysfunction with an EF of 20% with cardiology recommending medical management. Since discharge to the skilled nursing facility patient has continually declined most especially over the past 2 weeks with worsening altered mentation. For the past 3 days he has not eaten or drunk anything. Wife reports that when he was  attempting to eat he was choking on food/liquids. Of note he was found to have profound dysphagia during previous swallowing evaluation. Family was made aware of risks of feeding therefore comfort feeds were recommended at discharge.   In review of the outpatient documentation, the patient's daughter had an extensive conversation with the patient's neurosurgeon on 10/13 and updated him on the patient's progressive decline since arriving to the nursing facility. Per this documentation the neurosurgeon emphasized that during the previous hospitalization they were unable to find any clearly reversible cause of the patient's decline despite an extensive workup that left no potentially reversible causes left to be explored. He discussed with her that current symptoms likely represented the final common pathway of his terminal illness and recommended not pursuing aggressive treatment and to pursue only measures that would contribute to the patient's comfort and dignity. He reports he specifically discussed the possibility of adopting a hospice philosophy and commended utilization of hospice services. He further emphasized that his new heart failure could certainly be a contributory factor to recent decline. He also discussed patient's history regarding the DBS with the neurosurgeon explaining that his current symptomatology was not related to absence of a DBS.   Patient was sent from [name] today where he was found to have a temperature of 102.7. His O2 saturation was 80% on 2 L oxygen. EMS arrived and placed the patient on 100% nonrebreather mask with movement and saturations to 93%. Chest x-ray here was unremarkable but he was clearly dehydrated with contraction alkalosis on ABG with pH of 7.52, sodium 155, BUN 42 and creatinine 1.69 with a lactic acid of 2.16. He also had a white count of 16,300 with a neutrophils 86% and absolute neutrophils 14%. He was unresponsive to verbal or  tactile  stimuli.  During my discussion with the patient's wife, daughter and son-in-law, all of his prearrival information was reviewed again with them. I was quite honest with them explaining that his current presentation was only a symptom of an irreversible condition i.e. Parkinson's leading to dysphagia leading to recurrent aspiration as well as poor oral intake leading to severe dehydration and kidney injury. We discussed the futility of continuing to treat symptoms of an irreversible condition. Based on patient's previously expressed wishes and the fact that they wished for [name] to experience no further suffering, a plan of care was developed that included focusing on dignity and comfort. All aggressive measures have been withdrawn and our focus will be on comfort. Family was amenable to meeting with the palliative medicine team for family and emotional support. Palliative medicine can also assist with determining appropriate discharge diagnosis if the current dying process slower than anticipated although given patient's presentation symptoms it is likely he will experience an inpatient death.  ED Course:  Vital Signs: BP 100/71   Pulse 73   Temp (!) 103.4 F (39.7 C) (Rectal)   Resp 25   Ht 5\' 11"  (1.803 m)   Wt 71.8 kg (158 lb 6.4 oz)   SpO2 96%   BMI 22.09 kg/m  PCXR: No active disease and no evidence of pneumonia or pulmonary edema Lab data: Sodium 155, potassium 4.1, chloride 121, BUN 42, creatinine 1.69, glucose 141, LFTs normal, albumin 3.2, lactic acid 2.16, white count 16,300, neutrophils 6%, absolute neutrophils or 10%, hemoglobin 16.2, platelets 383,000; blood cultures were obtained in the ER ABG: PH 7.52, PCO2 30.1, PO2 112, ABE 2.0, bicarbonate 24 Medications and treatments: Normal saline bolus 1 L, Zosyn 3.375 g IV 1   Review of Systems:  In addition to the HPI above,   **patient unable to participate with history secondary to altered mentation therefore history obtained  from medical record and patient's family at bedside No Headache, changes with Vision or hearing, new weakness, tingling, numbness in any extremity, dizziness, dysarthria or word finding difficulty, gait disturbance or imbalance, tremors or seizure activity No problems with abdominal pain with or after eating No Chest pain, palpitations, orthopnea or DOE No Abdominal pain, N/V, melena, hematochezia, dark tarry stools, constipation No dysuria, malodorous urine, hematuria or flank pain No new skin rashes, lesions, masses or bruises, No new joint pains, aches, swelling or redness No recent unintentional weight gain  No polyuria, polydypsia or polyphagia   Past Medical History:  Diagnosis Date  . Atrioventricular block, complete (HCC)   . BPH (benign prostatic hypertrophy)   . Bradycardia    syncope due to prolonged pauses, resolved with PPM  . CAD (coronary artery disease)   . Coronary artery disease 1986   Inferior wall MI treated with Angioplasty  . Depression   . Dermatitis   . 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 Dysphagia   . Dysphagia 03/10/2016  . H/O hiatal hernia   . Herpetic gingivostomatitis 01/19/2015  . History of chicken pox 03/10/2016  . Hypertension   . Infection and inflammatory reaction due to nervous system device, implant, and graft (HCC) 05/15/2015  . Inferior MI (HCC)   . Maxillary fracture (HCC) 2009   Right  . Maxillary fracture (HCC) 2009   Right side  . Medicare annual wellness visit, subsequent 03/23/2016  . Normal echocardiogram 2007   Mild LVH with Impaired relaxation, Mild-Mod. Aortic Root dilation, Mild Aortic Sclerosis, Mild MR, Mild TR,  Ef-50-55%  . Normal nuclear stress test 2006   Normal EF-54%  . Olecranon bursitis of left elbow 01/19/2015  . Pacemaker   . Parkinson disease (HCC)   . Posterior vitreous detachment 03/04/2012  . Rib fracture 07/2010  . Rib fractures 2011   S/p fall  . Sleep apnea     Past Surgical History:  Procedure  Laterality Date  . arthroscopic knee     Left knee  . CARDIAC CATHETERIZATION     Multiple Angioplasties, last 2002  . Cataract Surgery     Bilateral  . DEEP BRAIN STIMULATOR PLACEMENT     Parkinsons  . EYE SURGERY    . INGUINAL HERNIA REPAIR     Bilateral  . INSERT / REPLACE / REMOVE PACEMAKER    . INSERTION / PLACEMENT / REVISION NEUROSTIMULATOR    . PACEMAKER INSERTION  10/28/10   MDT implanted by Dr Ladona Ridgelaylor  . removal of ganglion cyst     right wrist    Social History   Social History  . Marital status: Married    Spouse name: N/A  . Number of children: N/A  . Years of education: N/A   Occupational History  . Not on file.   Social History Main Topics  . Smoking status: Never Smoker  . Smokeless tobacco: Never Used  . Alcohol use No  . Drug use: No  . Sexual activity: Not Currently   Other Topics Concern  . Not on file   Social History Narrative  . No narrative on file    Mobility: Bedbound Work history: Not applicable   Allergies  Allergen Reactions  . Fortaz [Ceftazidime Sodium In D5w] Hives    Questionable reaction to [employer].  Rash on forehead, nose, neck, several whelps all over back/side, possible swelling of lips developed 2 hours after 2nd dose of Ceftaz on 06/05/15. Patient received 1st doses of Vanc, Cipro, Ceftaz several hours (>8h) prior to reaction.  Pt tolerated Cefepime course previously.  . Crestor [Rosuvastatin Calcium] Other (See Comments)    Reaction:  Muscle weakness   . Lipitor [Atorvastatin] Other (See Comments)    Joint pain  . Pneumococcal Vaccines Other (See Comments)    Per pt's wife, he developed PNA after receiving vaccine.  . Rosuvastatin Dermatitis and Other (See Comments)    Reaction:  Muscle weakness   . Bactrim Rash  . Gluten Rash and Other (See Comments)    Pts wife states that he can eat some gluten.    . Vancomycin Swelling and Rash    Red man syndrome. Must infuse slowly. Consider premedication with  Benadryl.  Last administration resulted in angioedema     Family History  Problem Relation Age of Onset  . Heart attack Mother   . Heart disease Mother   . Hypertension Mother   . Heart attack Father   . Other Father     renal calculi  . Heart disease Father     5 MI  . Heart disease Brother 38    bypass double  . Hyperlipidemia Brother   . Hypertension Brother   . Heart attack Sister   . Heart disease Sister   . Heart disease Sister     Carita PianHolt Oran syndrome and septal defect.     Prior to Admission medications   Medication Sig Start Date End Date Taking? Authorizing Provider  acetaminophen (TYLENOL) 325 MG tablet Take 650 mg by mouth every 6 (six) hours as needed for fever.  Historical Provider, MD  aspirin 81 MG chewable tablet Chew 81 mg by mouth daily.    Historical Provider, MD  Calcium Carbonate-Vitamin D (CALCIUM-VITAMIN D) 500-200 MG-UNIT tablet Take 1 tablet by mouth 2 (two) times daily.    Historical Provider, MD  carbidopa-levodopa (SINEMET IR) 25-100 MG tablet Take 2 tablets by mouth 4 (four) times daily. 6AM, 10AM, 2PM, 6PM    Historical Provider, MD  carbidopa-levodopa (SINEMET IR) 25-100 MG tablet Take 1 tablet by mouth daily. Take at 2200    Historical Provider, MD  carvedilol (COREG) 3.125 MG tablet Take 3.125 mg by mouth 2 (two) times daily with a meal.    Historical Provider, MD  cephALEXin (KEFLEX) 500 MG capsule Take 500 mg by mouth 3 (three) times daily.    Historical Provider, MD  dapsone 25 MG tablet Take 50 mg by mouth daily. Take 2 tabs to = 50 mg    Historical Provider, MD  donepezil (ARICEPT) 10 MG tablet Take 10 mg by mouth daily.     Historical Provider, MD  doxazosin (CARDURA) 4 MG tablet Take 4 mg by mouth every evening.    Historical Provider, MD  escitalopram (LEXAPRO) 10 MG tablet Take 1 tablet (10 mg total) by mouth daily. 05/24/14   Mcarthur Rossetti Angiulli, PA-C  ezetimibe (ZETIA) 10 MG tablet TAKE 1 TABLET BY MOUTH EVERY DAY 06/16/16   Marinus Maw, MD  lisinopril (PRINIVIL,ZESTRIL) 5 MG tablet Take 5 mg by mouth daily.    Historical Provider, MD  nitroGLYCERIN (NITROSTAT) 0.4 MG SL tablet Place 1 tablet (0.4 mg total) under the tongue every 5 (five) minutes as needed for chest pain. 09/17/15   Cassell Clement, MD  OXYGEN Inhale 2 L/min into the lungs daily as needed. To maintain sats >90    Historical Provider, MD  QUEtiapine (SEROQUEL) 25 MG tablet Take 25 mg by mouth at bedtime.    Historical Provider, MD  rifampin (RIFADIN) 300 MG capsule Take 300 mg by mouth 2 (two) times daily.    Historical Provider, MD  sennosides-docusate sodium (SENOKOT-S) 8.6-50 MG tablet Take 2 tablets by mouth at bedtime.    Historical Provider, MD  vitamin B-12 (CYANOCOBALAMIN) 1000 MCG tablet Take 1 tablet (1,000 mcg total) by mouth daily. 05/24/14   Mcarthur Rossetti Angiulli, PA-C  vitamin C (ASCORBIC ACID) 500 MG tablet Take 500 mg by mouth 3 (three) times daily.     Historical Provider, MD  zinc sulfate 220 (50 Zn) MG capsule Take 220 mg by mouth daily.    Historical Provider, MD    Physical Exam: Vitals:   09-08-2016 1145 09-08-16 1200 08-Sep-2016 1215 September 08, 2016 1230  BP: 104/73 93/67 99/81  100/71  Pulse: 81 77  73  Resp: 26 (!) 27 (!) 27 25  Temp:      TempSrc:      SpO2: 98% 100%  96%  Weight:      Height:          Constitutional: Patient appears toxic and chronically ill, somewhat malnourished Eyes: PERRL, lids and conjunctivae normal ENMT: Mucous membranes are dry. Posterior pharynx clear of any exudate or lesions. Neck: normal, supple, no masses, no thyromegaly Respiratory: Coarse expiratory rhonchi with persistent tachypnea, no accessory muscle use and current respiratory effort is nonlabored on 100% nonrebreather mask Cardiovascular: Regular rate and rhythm, no murmurs / rubs / gallops. No extremity edema. 2+ pedal pulses. No carotid bruits. Hands and feet are warm to touch Abdomen: no tenderness, no masses palpated.  No hepatosplenomegaly.  Hypoactive bowel sounds positive.  Musculoskeletal: no clubbing / cyanosis. No joint deformity upper and lower extremities. no contractures. Range of motion and muscle tone not directly tested Skin: no rashes, lesions, ulcers. No induration Neurologic: CN 2-12 appear to be grossly intact. Sensation appears to be intact, DTR not tested. Strength not tested secondary to patient inability to participate with exam Psychiatric: Unresponsive   Labs on Admission: I have personally reviewed following labs and imaging studies  CBC:  Recent Labs Lab 09-17-2016 1030  WBC 16.3*  NEUTROABS 14.0*  HGB 16.2  HCT 51.4  MCV 88.0  PLT 383   Basic Metabolic Panel:  Recent Labs Lab 2016-09-17 1030  NA 155*  K 4.1  CL 121*  CO2 23  GLUCOSE 141*  BUN 42*  CREATININE 1.69*  CALCIUM 9.0   GFR: Estimated Creatinine Clearance: 37.2 mL/min (by C-G formula based on SCr of 1.69 mg/dL (H)). Liver Function Tests:  Recent Labs Lab Sep 17, 2016 1030  AST 37  ALT 33  ALKPHOS 99  BILITOT 0.9  PROT 7.6  ALBUMIN 3.2*   No results for input(s): LIPASE, AMYLASE in the last 168 hours. No results for input(s): AMMONIA in the last 168 hours. Coagulation Profile: No results for input(s): INR, PROTIME in the last 168 hours. Cardiac Enzymes: No results for input(s): CKTOTAL, CKMB, CKMBINDEX, TROPONINI in the last 168 hours. BNP (last 3 results) No results for input(s): PROBNP in the last 8760 hours. HbA1C: No results for input(s): HGBA1C in the last 72 hours. CBG: No results for input(s): GLUCAP in the last 168 hours. Lipid Profile: No results for input(s): CHOL, HDL, LDLCALC, TRIG, CHOLHDL, LDLDIRECT in the last 72 hours. Thyroid Function Tests: No results for input(s): TSH, T4TOTAL, FREET4, T3FREE, THYROIDAB in the last 72 hours. Anemia Panel: No results for input(s): VITAMINB12, FOLATE, FERRITIN, TIBC, IRON, RETICCTPCT in the last 72 hours. Urine analysis:    Component Value Date/Time    COLORURINE ORANGE (A) 05/09/2016 1539   APPEARANCEUR CLEAR 05/09/2016 1539   LABSPEC 1.022 05/09/2016 1539   PHURINE 6.0 05/09/2016 1539   GLUCOSEU NEGATIVE 05/09/2016 1539   HGBUR NEGATIVE 05/09/2016 1539   BILIRUBINUR NEGATIVE 05/09/2016 1539   KETONESUR TRACE (A) 05/09/2016 1539   PROTEINUR NEGATIVE 05/09/2016 1539   UROBILINOGEN 0.2 06/04/2015 1316   NITRITE NEGATIVE 05/09/2016 1539   LEUKOCYTESUR TRACE (A) 05/09/2016 1539   Sepsis Labs: @LABRCNTIP (procalcitonin:4,lacticidven:4) )No results found for this or any previous visit (from the past 240 hour(s)).   Radiological Exams on Admission: Dg Chest Port 1 View  Result Date: 17-Sep-2016 CLINICAL DATA:  Fever, hypertension EXAM: PORTABLE CHEST 1 VIEW COMPARISON:  Chest x-ray dated 07/29/2015. FINDINGS: Mild cardiomegaly is stable. Overall cardiomediastinal silhouette appears stable in size and configuration. Right chest wall pacemaker leads appear intact and stable in position. Lungs are clear. No pleural effusion or pneumothorax seen. Osseous structures about the chest are unremarkable. IMPRESSION: No active disease.  No evidence of pneumonia or pulmonary edema. Electronically Signed   By: Bary Richard M.D.   On: 2016/09/17 10:09      Assessment/Plan Active Problems:   Idiopathic Parkinson's disease (End stage) with dysphagia and recurrent aspiration pneumonitis -Patient presents with altered mentation which is multifactorial in the setting of acute hypoxemia and fever that is likely 2/2  recurrent aspiration pneumonitis -Focus will now be on comfort measures -End-of-life order set initiated: Patient currently unresponsive and appears to be breathing comfortably so we'll start with IV morphine prn, Haldol prn  and morphine neb prn -Palliative medicine consulted to assist with end-of-life care -All aggressive and unnecessary medications and treatments have been declined/not ordered -Oxygen as comfort measure but will not follow  pulse oximetry; if facemask oxygen becomes uncomfortable for patient can transition to nasal cannula -Air mattress  **After arrival to floor pt developed increased WOB and use of accessory muscles so MSO4 prn inj changed to MSO4 insfusion w/ orders to titrate      Chronic systolic CHF (congestive heart failure), NYHA class 4  -Recent EF had decreased from previous and 50% to 20% and this is likely contributing to patient's current poor physiologic response to repeated stressors    Acute kidney injury  -Secondary to poor oral intake further exacerbated by utilization of ACE inhibitor prior to admission -Also reflective of patient's progressive decline and lack of reserve to respond to acute stressors -Baseline renal function: 19/0.9 -Current renal function: 42/1.69    Cardiac pacemaker in situ -Orders left to turn off pacemaker based on criteria in end-of-life order set      DVT prophylaxis: None since focus is on comfort Code Status: DO NOT RESUSCITATE Family Communication: Extensive discussion as outlined above with patient's wife daughter and son-in-law Disposition Plan: At this juncture anticipate [name] death but if dying process occur slower than anticipated may need to discharge to residential hospice Consults called: Palliative medicine    Vivianne Carles L. ANP-BC [facility] Pager [phone]   If 7PM-7AM, please contact night-coverage [email or web address] Password [email or web address]  2016-09-07, 12:45 PM

## 2016-09-10 NOTE — Progress Notes (Signed)
   Sep 11, 2016 1100  Clinical Encounter Type  Visited With Family  Visit Type Initial  Referral From Nurse  Consult/Referral To Chaplain  Spiritual Encounters  Spiritual Needs Emotional  Stress Factors  Patient Stress Factors Loss of control  Family Stress Factors Loss of control  CHP responded to page. Provided emotional support to patient's wife until family came.  Will continue to follow up throughout shift.

## 2016-09-10 DEATH — deceased

## 2016-09-24 ENCOUNTER — Other Ambulatory Visit: Payer: Medicare Other

## 2016-09-24 ENCOUNTER — Ambulatory Visit: Payer: Medicare Other | Admitting: Cardiovascular Disease

## 2016-10-10 NOTE — Discharge Summary (Signed)
  Death Summary  SAICHARAN SPARACINO KHT:[id number] DOB: 01-10-1941 DOA: 09-03-16  PCP: Danise Edge, MD  Admit date: 09/03/16 Date of Death: Sep 28, 2016 Time of Death: Mar 12, 2039 Notification: Danise Edge, MD notified of death of 09/28/16   History of present illness:  CELSO COLOCHO is a 75 y.o. male with a history of sleep apnea, rib fractures, PD, pacemaker, myocardial infarction, HTN, dysphagia, depression, CAD, BPH and AV block. Alonza Bogus presented with complaint of altered mental status RASHAWD HILDEBRANDT did not improve after antibiotics and IVF. In the emergency room pt's clinical status was worsening. Discussion at bedside that PD and dysphagia likely cause of pulmonary infection and this will likely reoccur. Pt crying out in pain with vitals to weak pain meds and family against more aggressive care measures like pressors. Decision made to make patient comfort care. Pt placed on morphine drip. Comfort care orderset used. Family at bedside. Pt passed away at 2040pm.   Final Diagnoses:  1.   Death 2.   Sepsis   The results of significant diagnostics from this hospitalization (including imaging, microbiology, ancillary and laboratory) are listed below for reference.    Significant Diagnostic Studies: Dg Chest Port 1 View  Result Date: 09-03-2016 CLINICAL DATA:  Fever, hypertension EXAM: PORTABLE CHEST 1 VIEW COMPARISON:  Chest x-ray dated 07/29/2015. FINDINGS: Mild cardiomegaly is stable. Overall cardiomediastinal silhouette appears stable in size and configuration. Right chest wall pacemaker leads appear intact and stable in position. Lungs are clear. No pleural effusion or pneumothorax seen. Osseous structures about the chest are unremarkable. IMPRESSION: No active disease.  No evidence of pneumonia or pulmonary edema. Electronically Signed   By: Bary Richard M.D.   On: Sep 03, 2016 10:09    Microbiology: No results found for this or any previous visit (from the past 240  hour(s)).   Labs: Basic Metabolic Panel: No results for input(s): NA, K, CL, CO2, GLUCOSE, BUN, CREATININE, CALCIUM, MG, PHOS in the last 168 hours. Liver Function Tests: No results for input(s): AST, ALT, ALKPHOS, BILITOT, PROT, ALBUMIN in the last 168 hours. No results for input(s): LIPASE, AMYLASE in the last 168 hours. No results for input(s): AMMONIA in the last 168 hours. CBC: No results for input(s): WBC, NEUTROABS, HGB, HCT, MCV, PLT in the last 168 hours. Cardiac Enzymes: No results for input(s): CKTOTAL, CKMB, CKMBINDEX, TROPONINI in the last 168 hours. D-Dimer No results for input(s): DDIMER in the last 72 hours. BNP: Invalid input(s): POCBNP CBG: No results for input(s): GLUCAP in the last 168 hours. Anemia work up No results for input(s): VITAMINB12, FOLATE, FERRITIN, TIBC, IRON, RETICCTPCT in the last 72 hours. Urinalysis    Component Value Date/Time   COLORURINE ORANGE (A) 05/09/2016 1539   APPEARANCEUR CLEAR 05/09/2016 1539   LABSPEC 1.022 05/09/2016 1539   PHURINE 6.0 05/09/2016 1539   GLUCOSEU NEGATIVE 05/09/2016 1539   HGBUR NEGATIVE 05/09/2016 1539   BILIRUBINUR NEGATIVE 05/09/2016 1539   KETONESUR TRACE (A) 05/09/2016 1539   PROTEINUR NEGATIVE 05/09/2016 1539   UROBILINOGEN 0.2 06/04/2015 1316   NITRITE NEGATIVE 05/09/2016 1539   LEUKOCYTESUR TRACE (A) 05/09/2016 1539   Sepsis Labs Invalid input(s): PROCALCITONIN,  WBC,  LACTICIDVEN     SIGNED:  Haydee Salter, MD  [facility] September 28, 2016, 7:19 AM Pager   If 7PM-7AM, please contact night-coverage [email or web address] Password [email or web address]

## 2017-03-14 IMAGING — CT CT HEAD W/O CM
2 series · 15 of 30 positions shown, 19 images · non-contrast
Comparison: 01/15/2015

CLINICAL DATA: Altered mental status for 1 day. Unable to
communicate today. History of Parkinson's for 30 years.
Neurostimulator. No previous stroke.

EXAM:
CT HEAD WITHOUT CONTRAST
TECHNIQUE: Contiguous axial images were obtained from the base of the skull
through the vertex without intravenous contrast.

[Series 2: head w/o · axial · non-contrast · 0.45mm/px · z∈[+1491,+1621]mm · 13 of 32 slices shown, 17 images]
[im 3/32  brain]
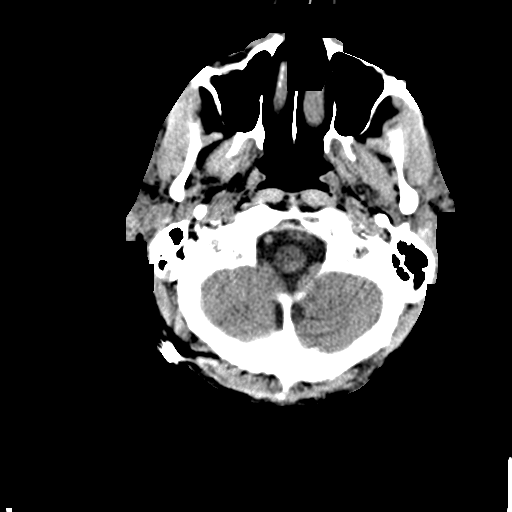
[im 3/32  bone]
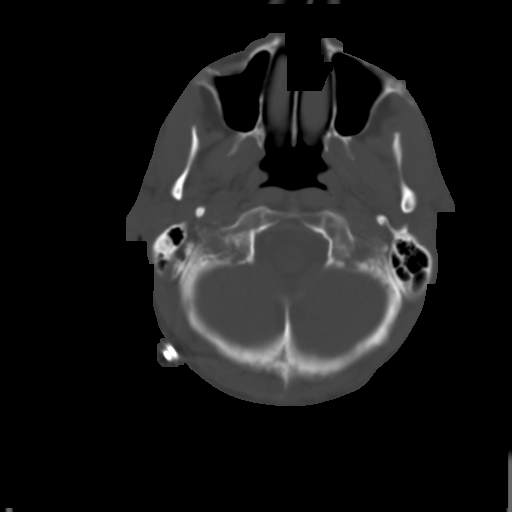
[im 5/32  brain]
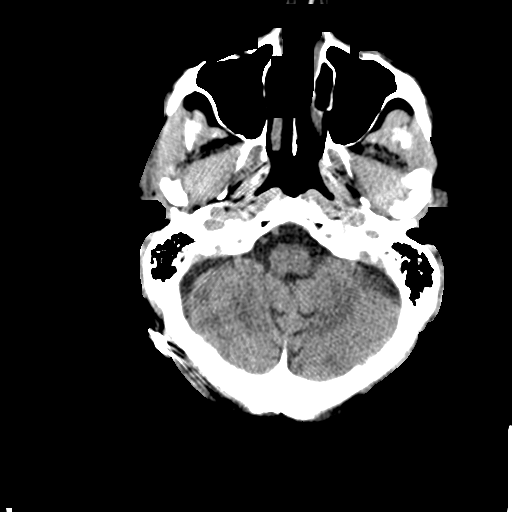
[im 7/32  brain]
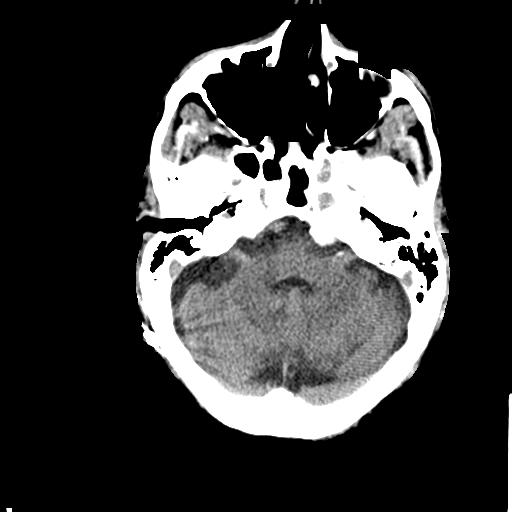
[im 9/32  brain]
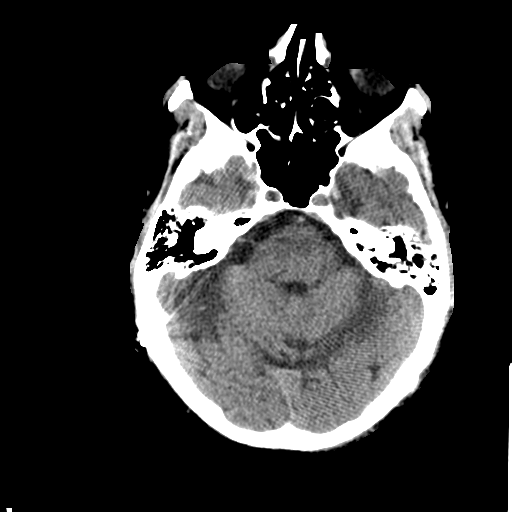
[im 12/32  brain]
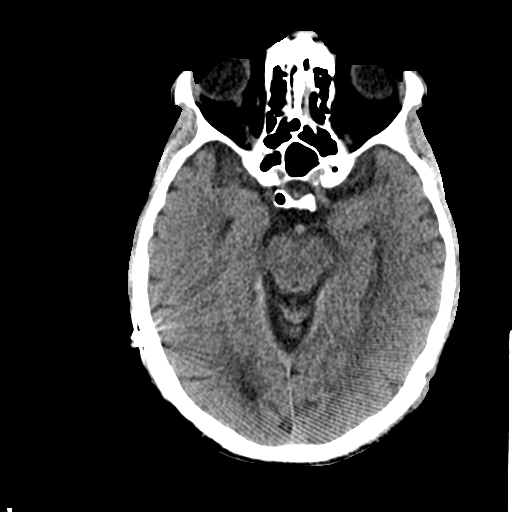
[im 12/32  bone]
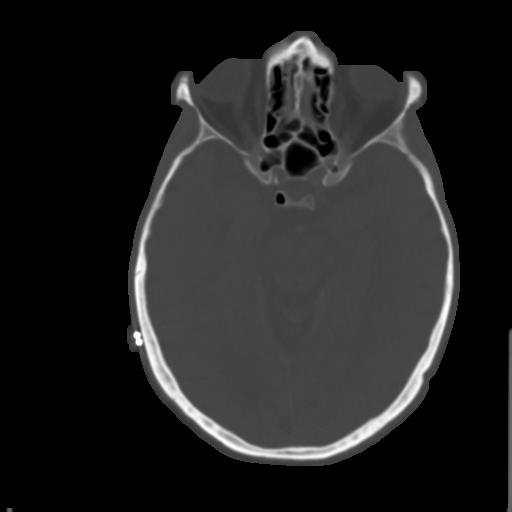
[im 14/32  brain]
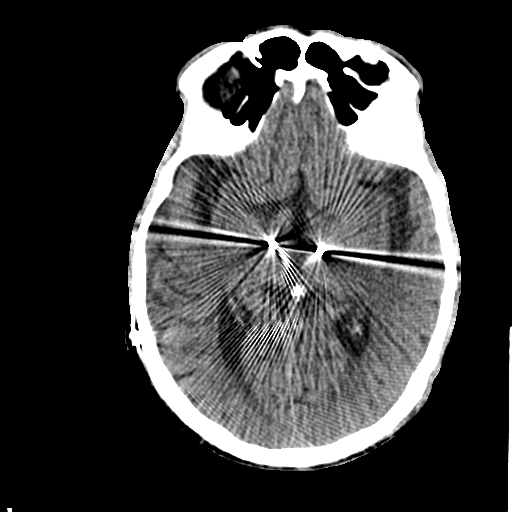
[im 16/32  brain]
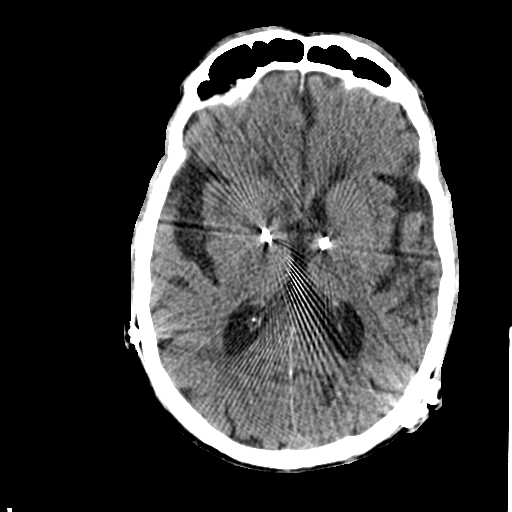
[im 18/32  brain]
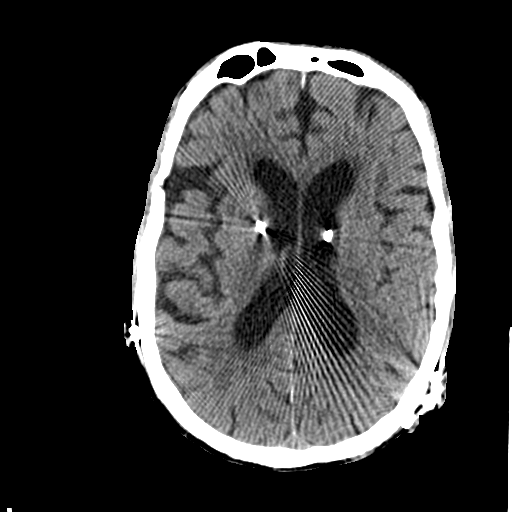
[im 20/32  brain]
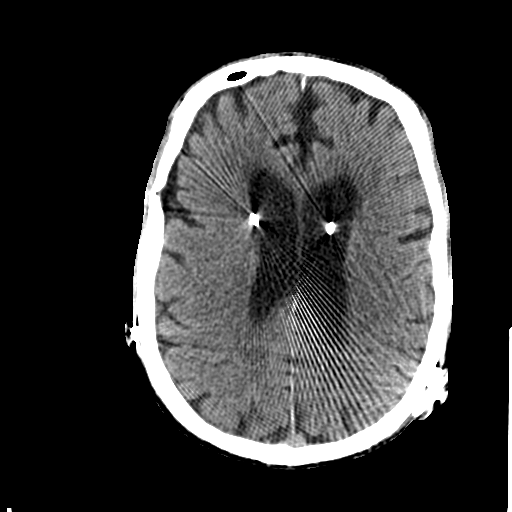
[im 20/32  bone]
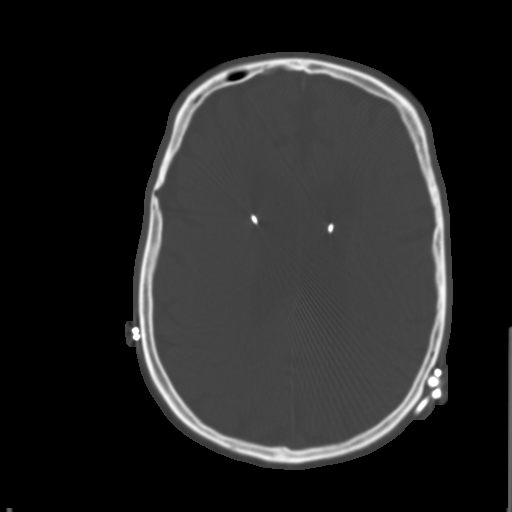
[im 23/32  brain]
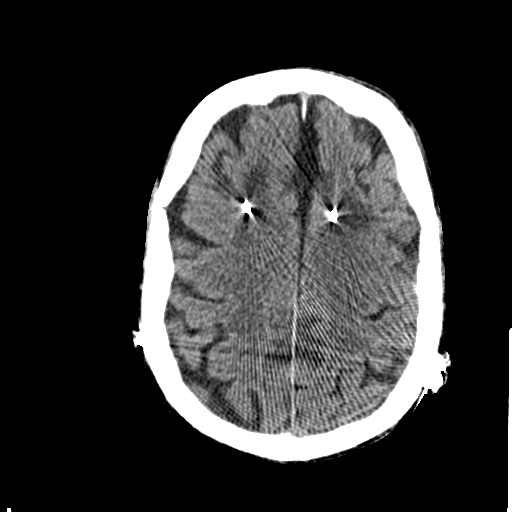
[im 25/32  brain]
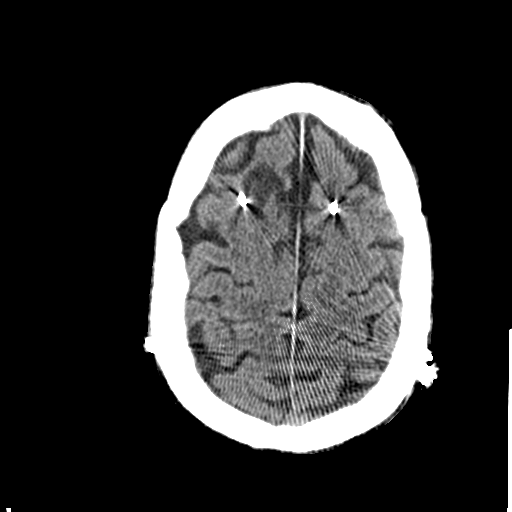
[im 27/32  brain]
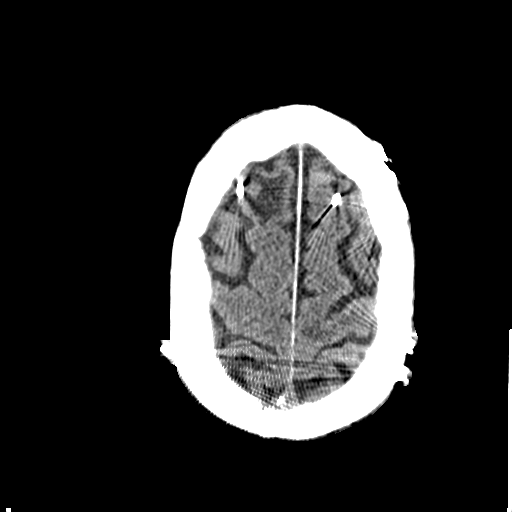
[im 29/32  brain]
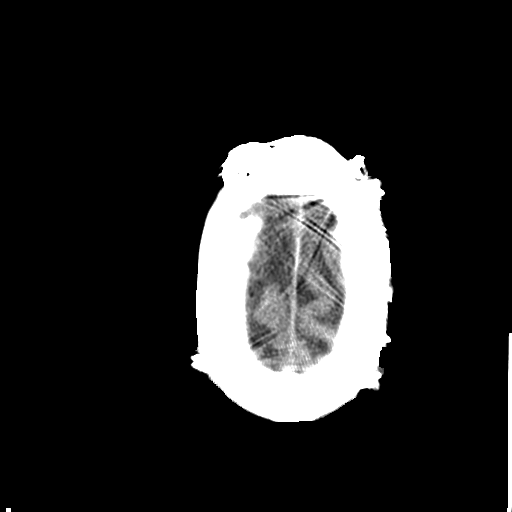
[im 29/32  bone]
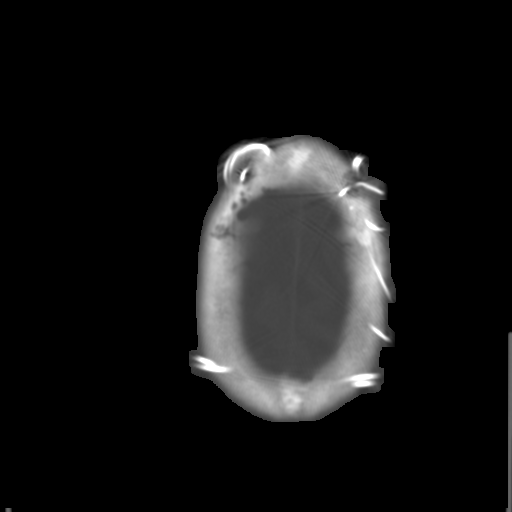

[Series 3: bone windows · axial · 0.45mm/px · z∈[+1491,+1511]mm · 2 of 32 slices shown]
[im 3/32  bone]
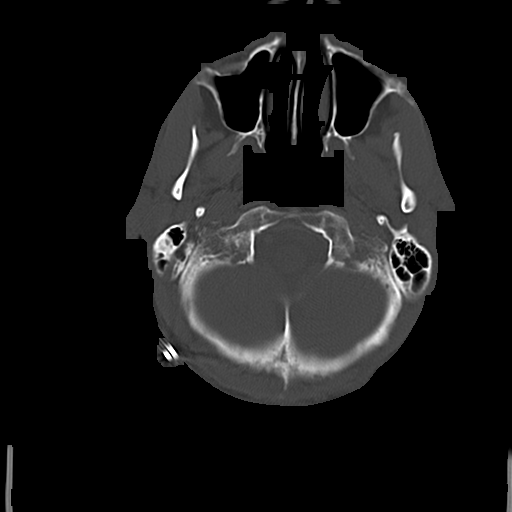
[im 7/32  bone]
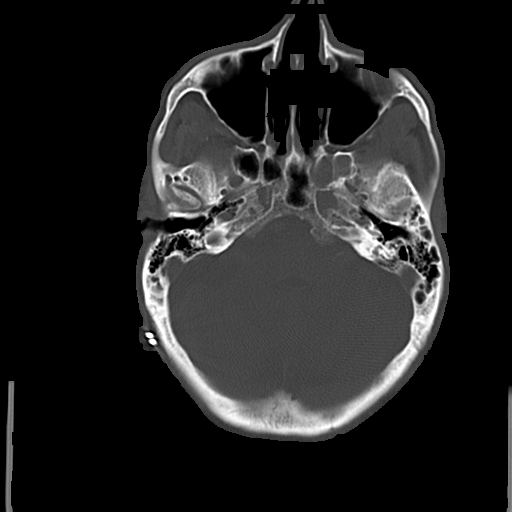

[15 of 30 positions shown; findings below may reference images not displayed]

FINDINGS: Dual leads of deep brain stimulator appears stable. There is
significant streak artifact. There is no intra or extra-axial fluid
collection or mass lesion. The basilar cisterns and ventricles have
a normal appearance. There is no CT evidence for acute infarction or
hemorrhage. There is moderate central and cortical atrophy.
Periventricular white matter changes are consistent with small
vessel disease. Right frontal encephalomalacia appears stable.

Rounded soft tissue density within the left sphenoid air cell
appears stable [REDACTED] indicate chronic sinus disease versus mucous
retention cyst, or polyp. No acute fractures.
IMPRESSION: 1. Stable appearance of deep brain stimulator.
2. Atrophy and small vessel disease.
3.  No evidence for acute intracranial abnormality.

## 2017-03-14 IMAGING — CR DG CHEST 2V
2 series · 2 of 2 positions shown · non-contrast
Comparison: 03/07/2015

CLINICAL DATA: Confusion

EXAM:
CHEST  2 VIEW

[x chest ap]
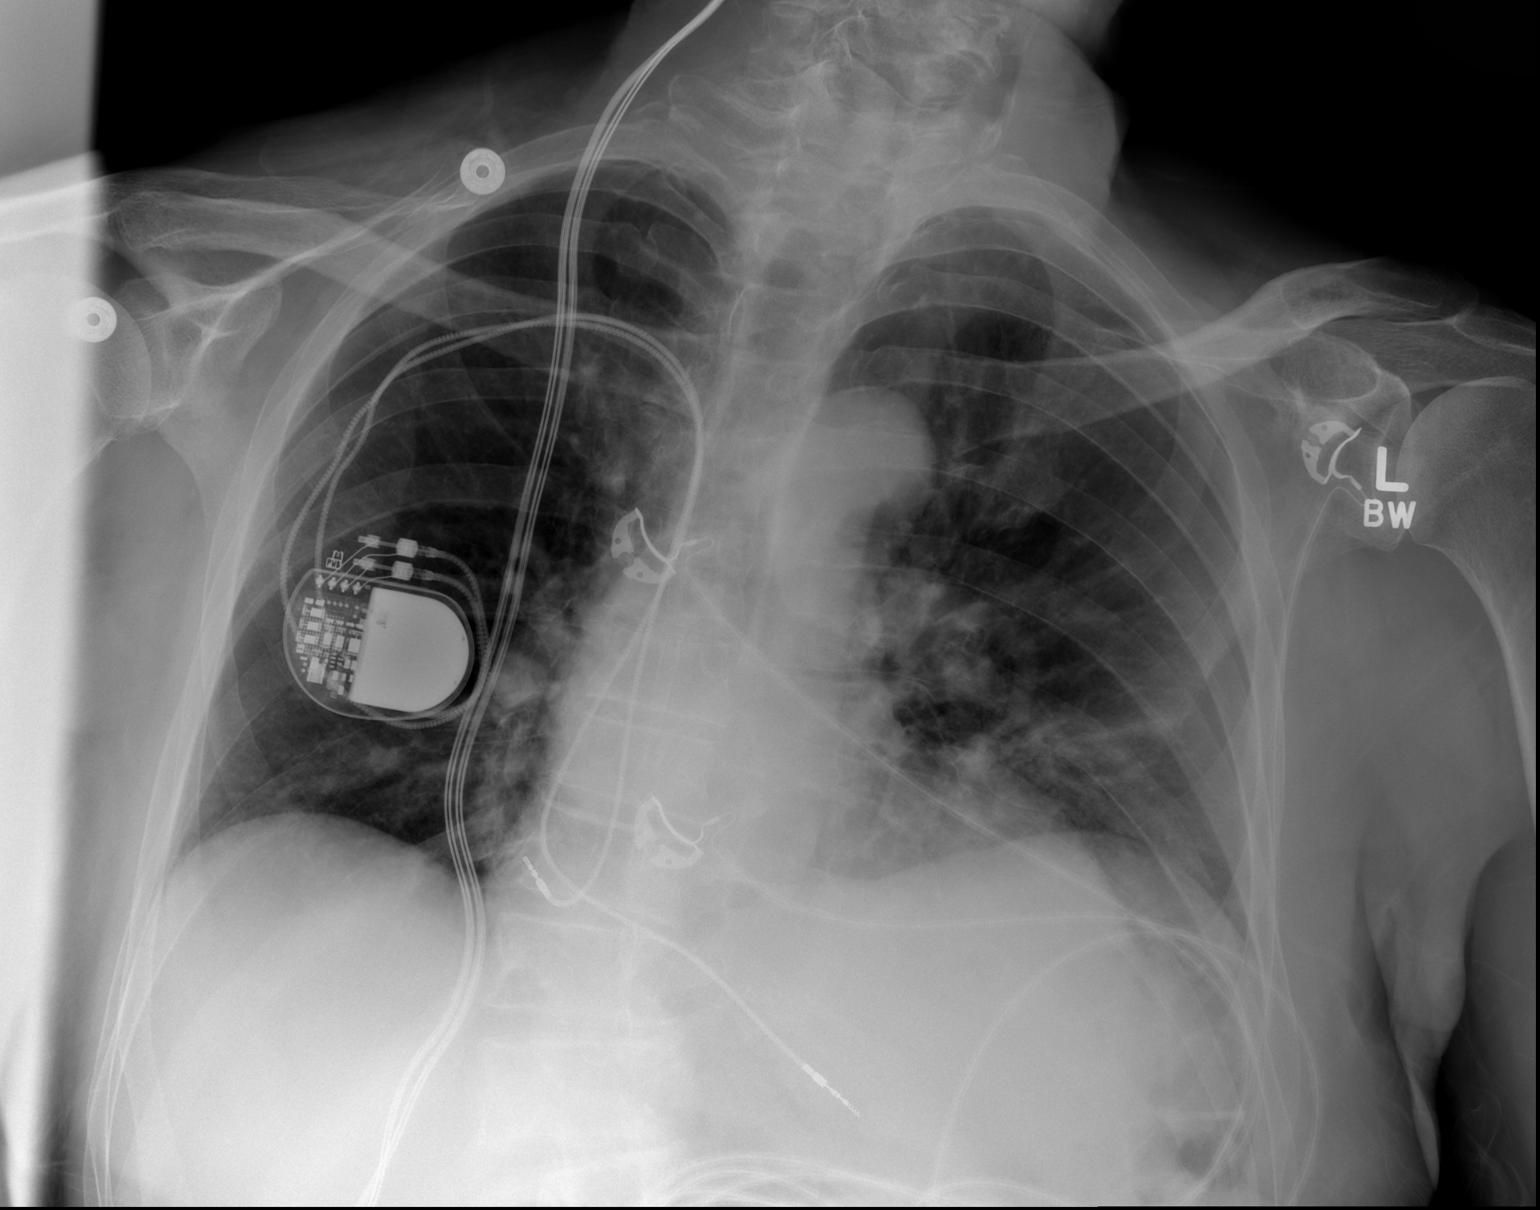

[w chest lat]
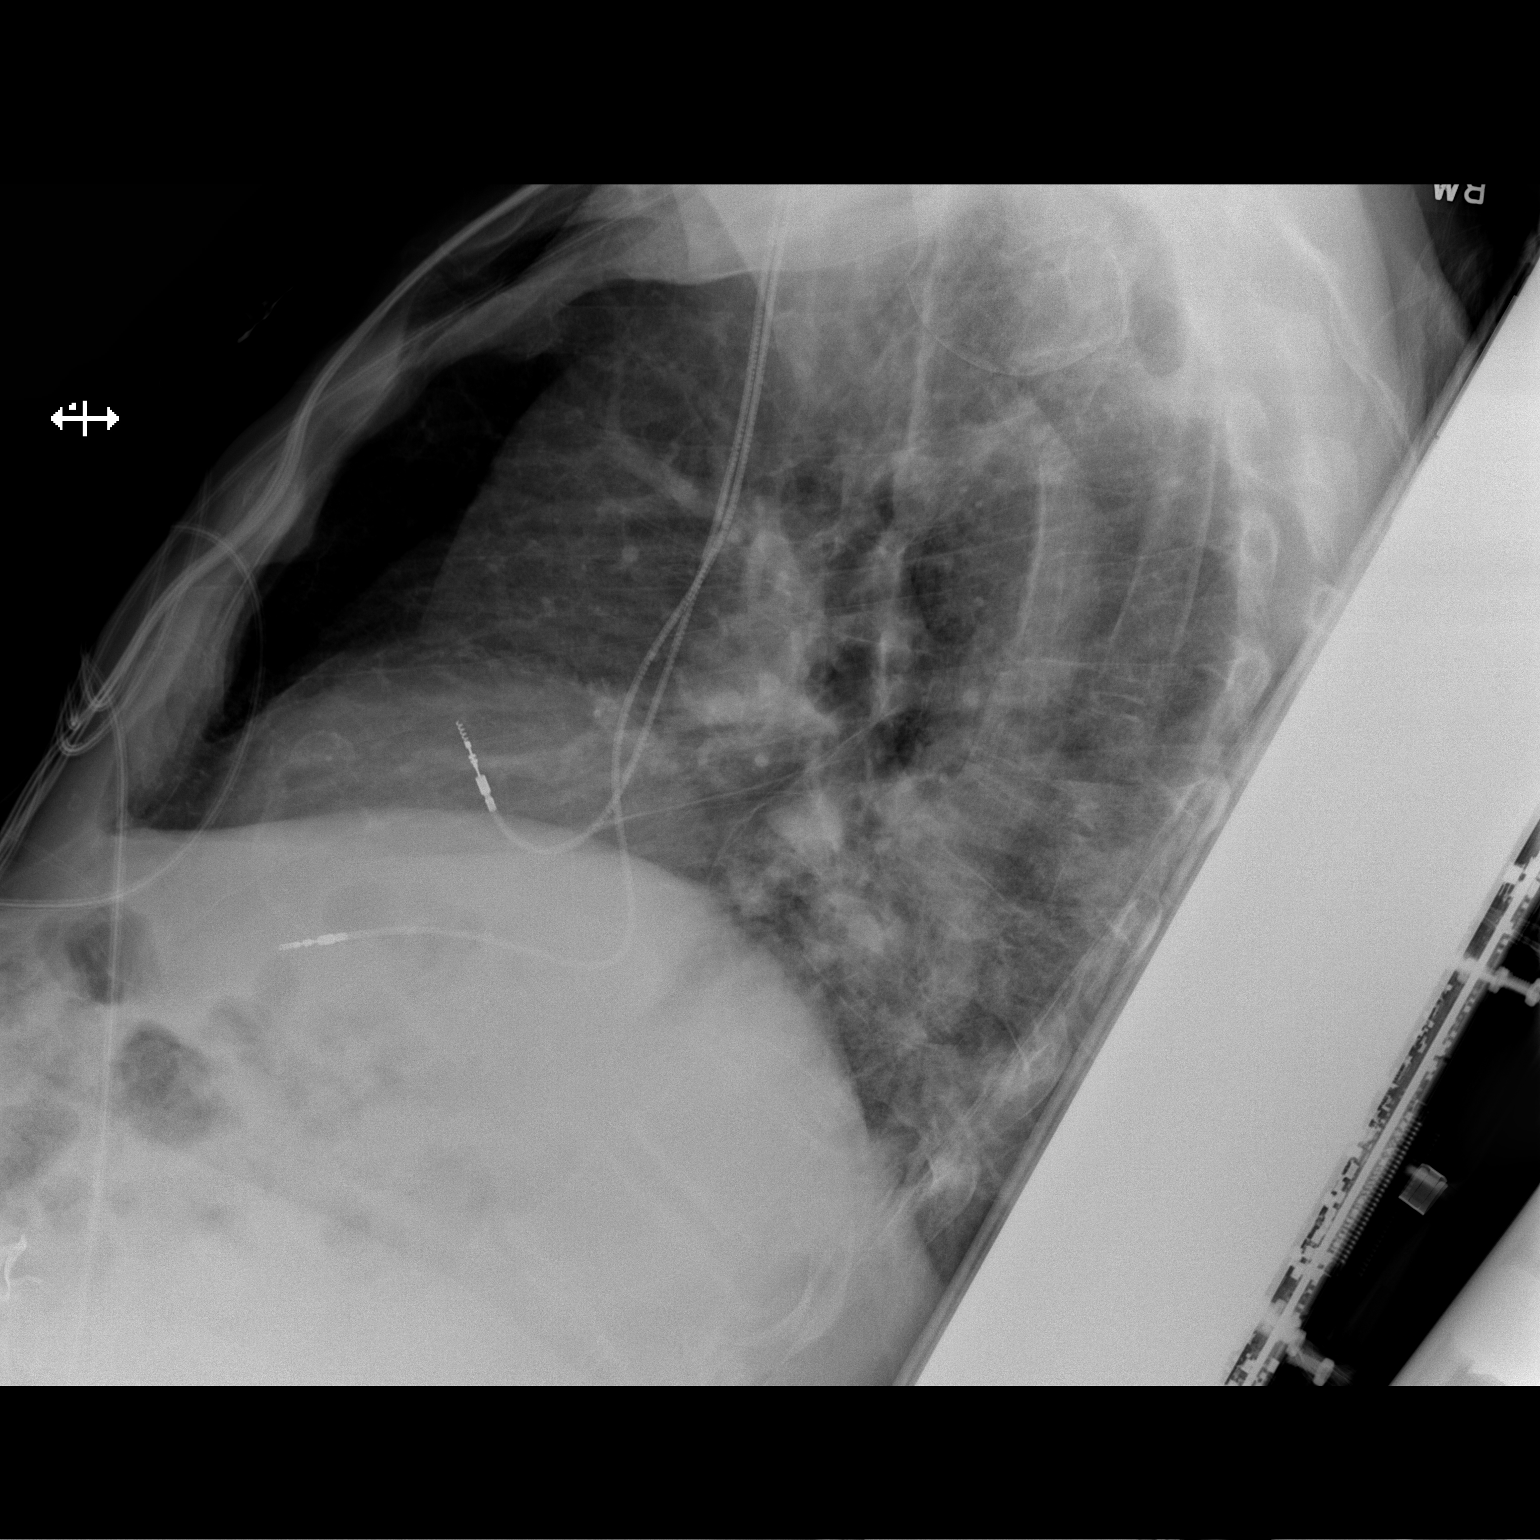

[2 of 2 positions shown; findings below may reference images not displayed]

FINDINGS: New left lower lobe consolidation is identified. Lungs are
hypoaerated with crowding of the bronchovascular markings. Bones are
subjectively osteopenic. Heart size upper limits of normal. Right
lung is grossly clear. Right-sided dual lead pacer in place.
Leads/catheter projects over the right hemithorax.
IMPRESSION: New left lower lobe consolidation suspicious for pneumonia.

## 2017-03-16 ENCOUNTER — Ambulatory Visit: Payer: Medicare Other | Admitting: Family Medicine

## 2017-05-08 IMAGING — CR DG CHEST 2V
3 series · 3 of 3 positions shown · non-contrast
Comparison: 06/04/2015.

CLINICAL DATA: Cough and congestion today.

EXAM:
CHEST  2 VIEW

[w chest lat (1 of 2)]
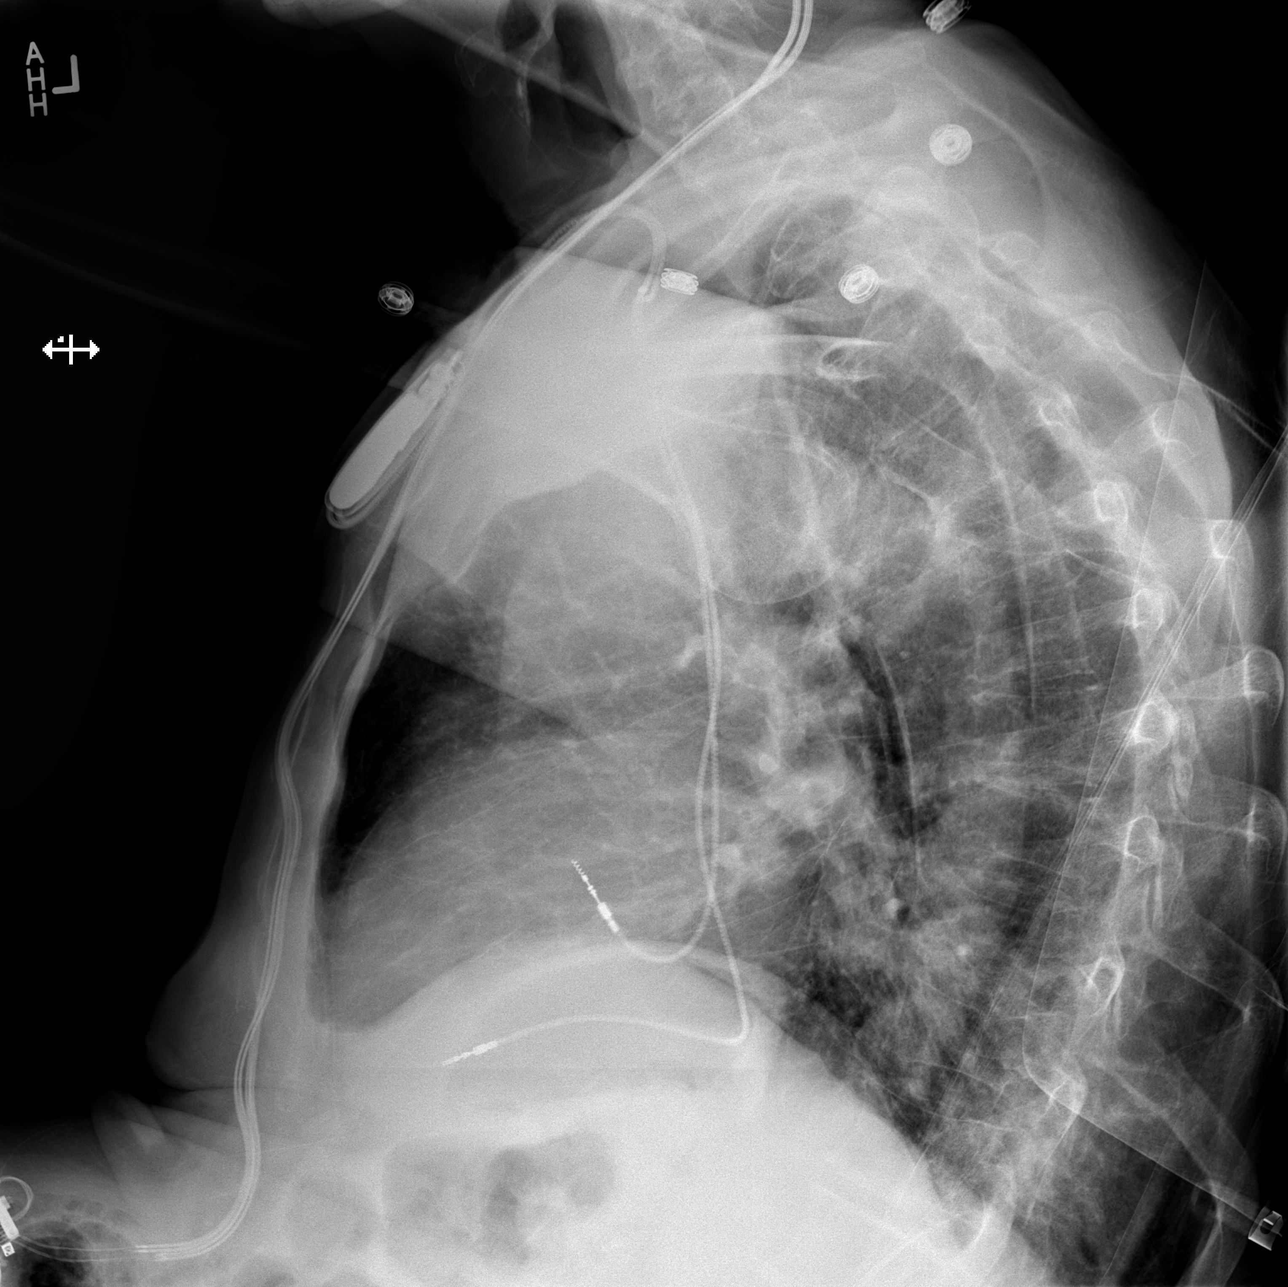

[w chest lat (2 of 2)]
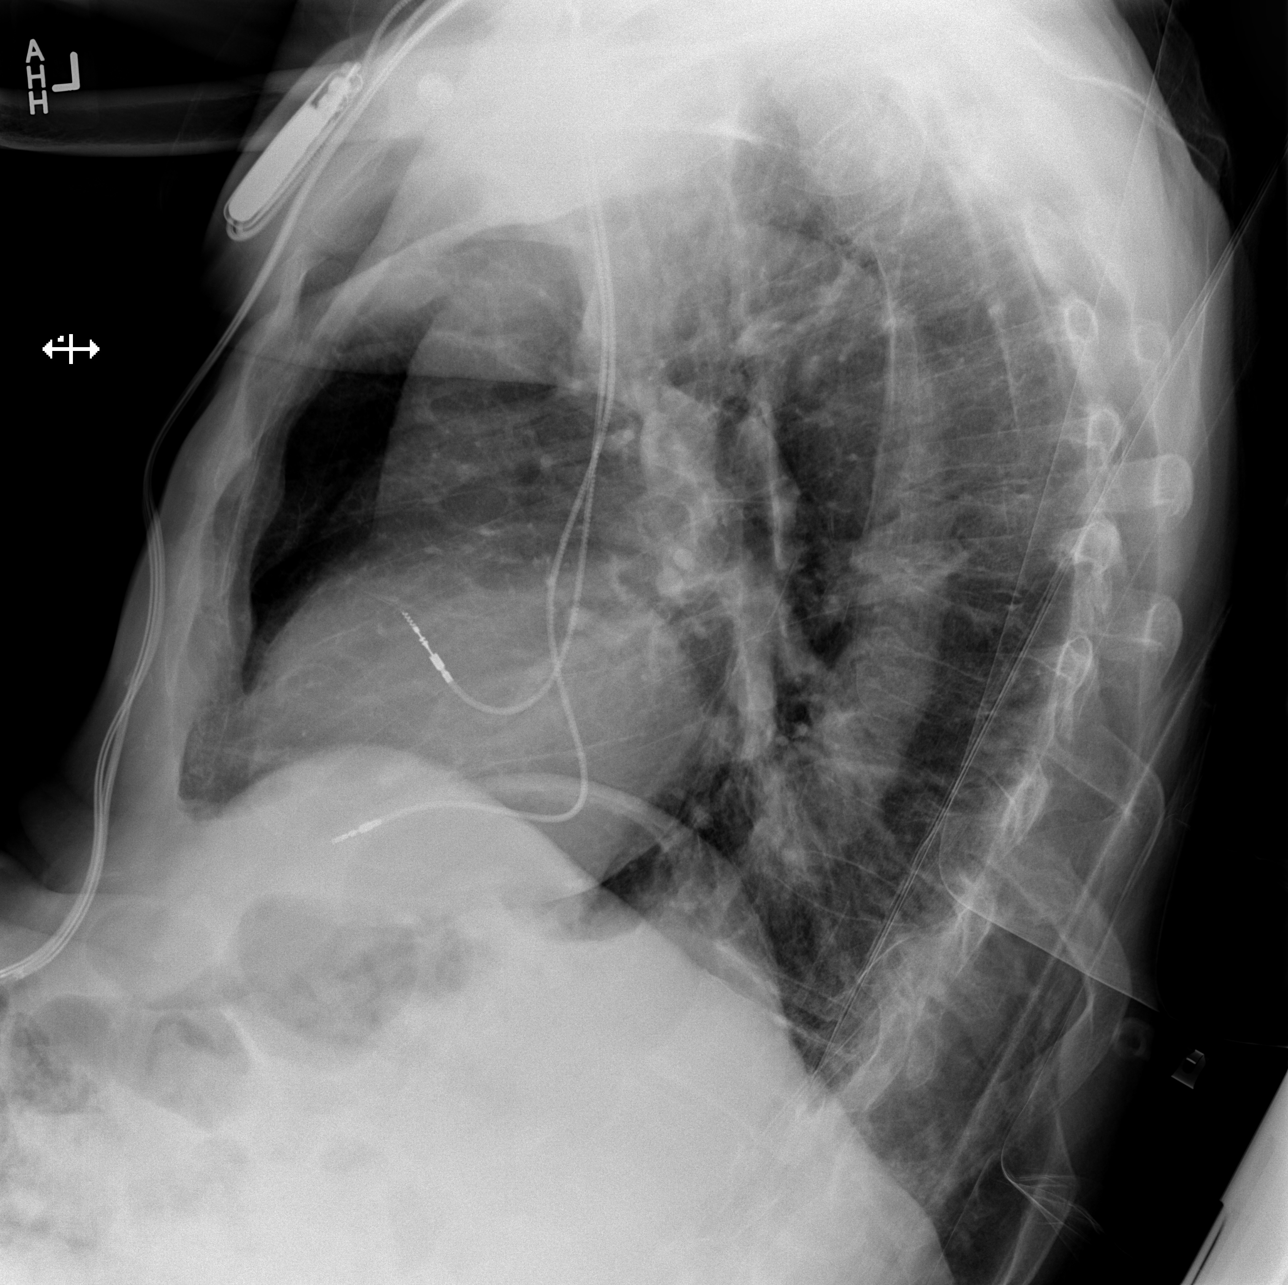

[x chest ap]
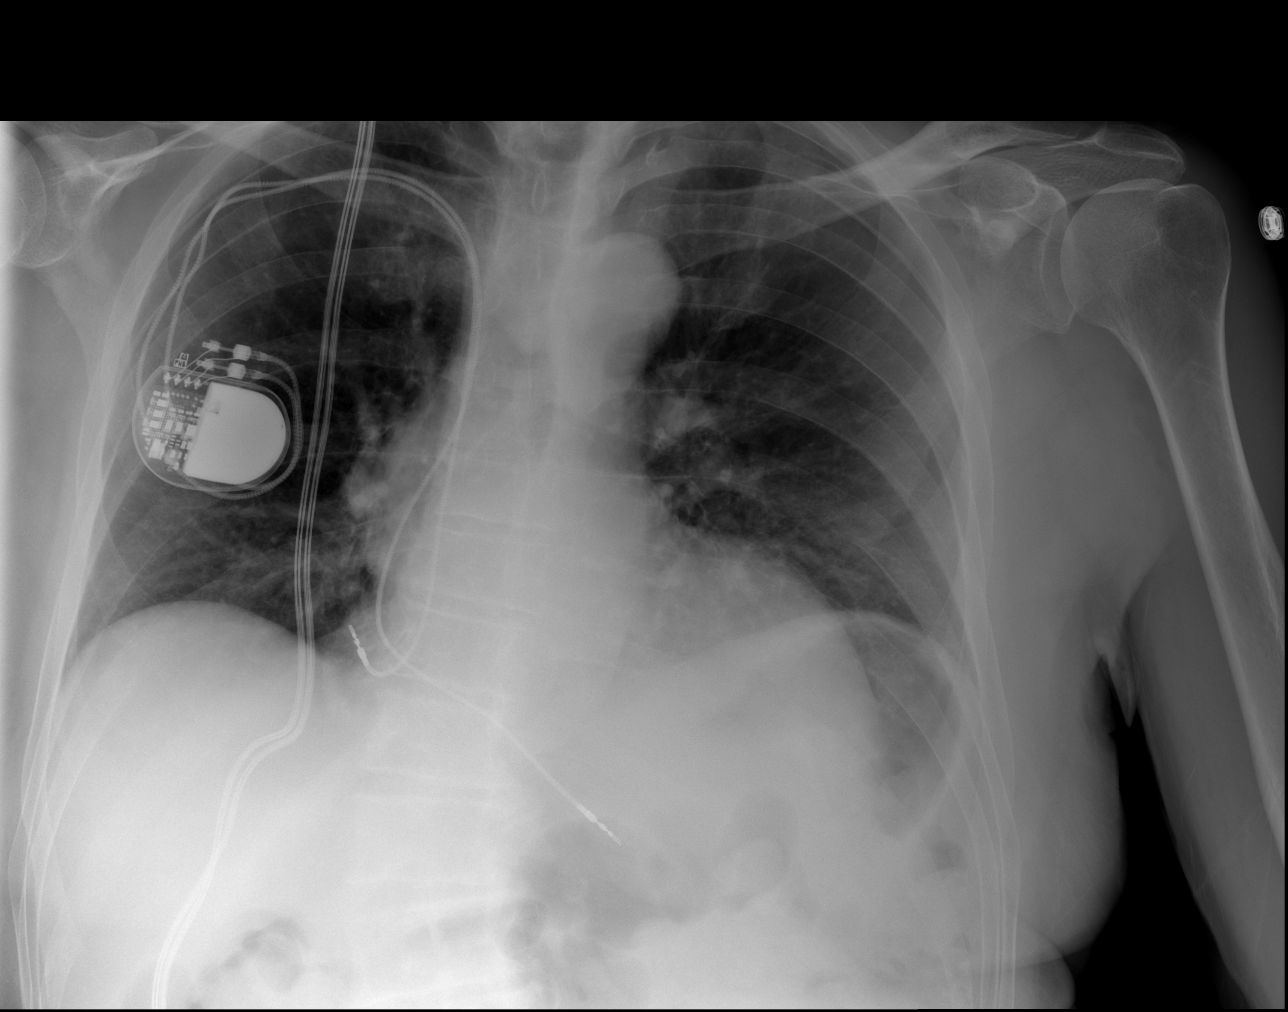

[3 of 3 positions shown; findings below may reference images not displayed]

FINDINGS: The heart is mildly enlarged but stable. There are chronic lung
changes but no acute pulmonary findings. The bony thorax is intact.
IMPRESSION: No acute cardiopulmonary findings.
